# Patient Record
Sex: Female | Born: 1945 | Race: White | Hispanic: No | Smoking: Former smoker
Health system: Southern US, Community
[De-identification: ages and names within clinical notes are randomized; demographics above are authoritative.]

## PROBLEM LIST (undated history)

## (undated) DIAGNOSIS — I509 Heart failure, unspecified: Secondary | ICD-10-CM

## (undated) DIAGNOSIS — E039 Hypothyroidism, unspecified: Secondary | ICD-10-CM

## (undated) DIAGNOSIS — G20A1 Parkinson's disease without dyskinesia, without mention of fluctuations: Secondary | ICD-10-CM

## (undated) DIAGNOSIS — I639 Cerebral infarction, unspecified: Secondary | ICD-10-CM

## (undated) DIAGNOSIS — C4490 Unspecified malignant neoplasm of skin, unspecified: Secondary | ICD-10-CM

## (undated) DIAGNOSIS — F32A Depression, unspecified: Secondary | ICD-10-CM

## (undated) DIAGNOSIS — I10 Essential (primary) hypertension: Secondary | ICD-10-CM

## (undated) DIAGNOSIS — F419 Anxiety disorder, unspecified: Secondary | ICD-10-CM

## (undated) DIAGNOSIS — R32 Unspecified urinary incontinence: Secondary | ICD-10-CM

## (undated) DIAGNOSIS — M109 Gout, unspecified: Secondary | ICD-10-CM

## (undated) DIAGNOSIS — G4733 Obstructive sleep apnea (adult) (pediatric): Secondary | ICD-10-CM

## (undated) DIAGNOSIS — F329 Major depressive disorder, single episode, unspecified: Secondary | ICD-10-CM

## (undated) DIAGNOSIS — N3281 Overactive bladder: Secondary | ICD-10-CM

## (undated) DIAGNOSIS — R600 Localized edema: Secondary | ICD-10-CM

## (undated) DIAGNOSIS — G20 Parkinson's disease: Secondary | ICD-10-CM

## (undated) DIAGNOSIS — R609 Edema, unspecified: Secondary | ICD-10-CM

## (undated) DIAGNOSIS — Z9989 Dependence on other enabling machines and devices: Secondary | ICD-10-CM

## (undated) DIAGNOSIS — R000 Tachycardia, unspecified: Secondary | ICD-10-CM

## (undated) DIAGNOSIS — J45909 Unspecified asthma, uncomplicated: Secondary | ICD-10-CM

## (undated) DIAGNOSIS — J302 Other seasonal allergic rhinitis: Secondary | ICD-10-CM

## (undated) DIAGNOSIS — I214 Non-ST elevation (NSTEMI) myocardial infarction: Secondary | ICD-10-CM

## (undated) DIAGNOSIS — J189 Pneumonia, unspecified organism: Secondary | ICD-10-CM

## (undated) DIAGNOSIS — Z9289 Personal history of other medical treatment: Secondary | ICD-10-CM

## (undated) DIAGNOSIS — D649 Anemia, unspecified: Secondary | ICD-10-CM

## (undated) HISTORY — DX: Tachycardia, unspecified: R00.0

## (undated) HISTORY — DX: Hypothyroidism, unspecified: E03.9

## (undated) HISTORY — PX: TUBAL LIGATION: SHX77

## (undated) HISTORY — PX: UTERINE FIBROID SURGERY: SHX826

## (undated) HISTORY — DX: Other seasonal allergic rhinitis: J30.2

## (undated) HISTORY — PX: TONSILLECTOMY: SUR1361

## (undated) HISTORY — DX: Parkinson's disease without dyskinesia, without mention of fluctuations: G20.A1

## (undated) HISTORY — PX: ABDOMINAL HYSTERECTOMY: SHX81

## (undated) HISTORY — DX: Parkinson's disease: G20

## (undated) HISTORY — PX: SKIN CANCER EXCISION: SHX779

## (undated) HISTORY — DX: Depression, unspecified: F32.A

## (undated) HISTORY — DX: Essential (primary) hypertension: I10

## (undated) HISTORY — PX: THYROIDECTOMY, PARTIAL: SHX18

## (undated) HISTORY — DX: Major depressive disorder, single episode, unspecified: F32.9

---

## 2006-01-20 ENCOUNTER — Encounter: Admission: RE | Admit: 2006-01-20 | Discharge: 2006-01-20 | Payer: Self-pay | Admitting: Cardiology

## 2014-10-14 ENCOUNTER — Ambulatory Visit
Admission: RE | Admit: 2014-10-14 | Discharge: 2014-10-14 | Disposition: A | Discharge status: Home / Self Care | Payer: Medicare Other | Source: Ambulatory Visit | Attending: Cardiology | Admitting: Cardiology

## 2014-10-14 ENCOUNTER — Other Ambulatory Visit: Payer: Self-pay | Admitting: Cardiology

## 2014-10-14 DIAGNOSIS — R0602 Shortness of breath: Secondary | ICD-10-CM

## 2015-09-28 ENCOUNTER — Encounter: Payer: Self-pay | Admitting: Neurology

## 2015-09-28 ENCOUNTER — Ambulatory Visit (INDEPENDENT_AMBULATORY_CARE_PROVIDER_SITE_OTHER): Payer: Medicare Other | Admitting: Neurology

## 2015-09-28 VITALS — BP 128/84 | HR 70 | Ht 65.0 in | Wt 179.0 lb

## 2015-09-28 DIAGNOSIS — G249 Dystonia, unspecified: Secondary | ICD-10-CM | POA: Diagnosis not present

## 2015-09-28 DIAGNOSIS — G20 Parkinson's disease: Secondary | ICD-10-CM | POA: Diagnosis not present

## 2015-09-28 NOTE — Progress Notes (Signed)
Ana Salazar was seen today in the movement disorders clinic for neurologic consultation at the request of [name], [name], MD.   The patient presents today for a second opinion regarding Parkinson's disease. She is accompanied by her daughter and sister who supplement the history.   I have reviewed an extensive number of her records from her prior neurologist, [name], and I appreciate those records.  The patient presented to [name] first in November, 2014, but reported that she had had symptoms for about a year and a half prior to that.  Her first symptoms consisted of difficulty moving her feet/shuffling the feet; trouble getting out of the bed; tremor (she thinks that it started in both but does state that the right is worse than the left and always has been).  The patient was started on a trial of levodopa at her first visit with [name] and followed up with him the next month and noted great improvement with the medication.  She was started on pramipexole the following visit but noted that it made her dizzy and it was discontinued over the telephone.  The following visit, she was started on entacapone 200 mg 3 times a day.  In April, 2015 her carbidopa/levodopa 25/100 was increased to 2 tablets 3 times per day.  In July, 2016 she was changed to carbidopa/levodopa 50/200, 3 times per day.  In October, 2016 she was placed on a combination of carbidopa/levodopa 50/200, 3 times a day (4-5am, noon, 8pm) in addition to carbidopa/levodopa 25/100, 2 tablets 3 times per day (1200 mg of levodopa per day).  She remains on the entacapone, 200 mg 3 times per day as well as primidone, 50 mg - 1.5 tablets at night, which she is using for tremor control.  She reports that her biggest frustration is that she is always moving and is more dizzy and is starting to have a few falls.  When she is nearing end of dose (1-2 hours prior) she will have tremor in her stomach, followed by her legs and arms.    Specific  Symptoms:  Tremor: Yes.   Voice: less and also more slurred in evening Sleep: trouble staying asleep  Vivid Dreams:  Yes.    Acting out dreams:  Yes.   Wet Pillows: Yes.   Postural symptoms:  Yes.    Falls?  Yes.   (last fall was beginning of December; never did parkinsons related PT) Bradykinesia symptoms: shuffling gait, slow movements and difficulty getting out of a chair Loss of smell:  Yes.   but also smells things that others don't Loss of taste:  Yes.   Urinary Incontinence:  Yes.  , wears undergarments x 1 year; happens when stands up or with stress incontinence Difficulty Swallowing:  rarely Handwriting, micrographia: Yes.   (right handed) Trouble with ADL's:  No.  Trouble buttoning clothing: Yes.   Depression:  No. per patient but yes per family and more agitated per family Memory changes:  Yes.   but minimally Hallucinations:  No.  visual distortions: Yes.   N/V:  No. Lightheaded:  Yes.    Syncope: No. Diplopia:  No. Dyskinesia:  Yes.  , since last visit at least and levodopa was increased  Neuroimaging has  previously been performed.  It is not available for my review today.  Pt states that it was done at [facility] (was done prior to going to [name] at [place]).  No fam hx of PD.  ALLERGIES:   Allergies  Allergen Reactions  . Amlodipine Other (See Comments)  . Valsartan Other (See Comments)  . Ampicillin Swelling  . Levothyroxine Other (See Comments)    Tired, shaky, tremors  . Nebivolol Rash  . Meloxicam Swelling  . Prednisone   . Amoxicillin Rash  . Nsaids Rash    CURRENT MEDICATIONS:  Outpatient Encounter Prescriptions as of 09/28/2015  Medication Sig  . carbidopa-levodopa (SINEMET CR) 50-200 MG tablet Take 1 tablet by mouth 3 (three) times daily.  . carbidopa-levodopa (SINEMET IR) 25-100 MG tablet Take 2 tablets by mouth 3 (three) times daily.  . cetirizine (ZYRTEC) 10 MG tablet Take 10 mg by mouth daily.  . ciprofloxacin (CIPRO) 500 MG tablet  Take 500 mg by mouth 2 (two) times daily.  . citalopram (CELEXA) 20 MG tablet Take 30 mg by mouth daily.  . cloNIDine (CATAPRES) 0.1 MG tablet Take 0.1 mg by mouth 2 (two) times daily.  Marland Kitchen CRANBERRY PO Take by mouth daily.  Marland Kitchen docusate sodium (COLACE) 100 MG capsule Take 100 mg by mouth daily.  . entacapone (COMTAN) 200 MG tablet Take 200 mg by mouth 3 (three) times daily.  Marland Kitchen FIBER PO Take by mouth daily.  Marland Kitchen losartan (COZAAR) 100 MG tablet Take 100 mg by mouth daily.  . potassium chloride (K-DUR,KLOR-CON) 10 MEQ tablet Take 10 mEq by mouth daily.  . primidone (MYSOLINE) 50 MG tablet Take 75 mg by mouth at bedtime.  . verapamil (CALAN) 80 MG tablet Take 80 mg by mouth 3 (three) times daily.   No facility-administered encounter medications on file as of 09/28/2015.    PAST MEDICAL HISTORY:   Past Medical History  Diagnosis Date  . Parkinson's disease ([place])   . Hypertension   . Kidney disease   . Tachycardia   . Depression   . Hypothyroidism   . Seasonal allergies     PAST SURGICAL HISTORY:   Past Surgical History  Procedure Laterality Date  . Thyroidectomy, partial    . Abdominal hysterectomy    . Cesarean section    . Tonsillectomy      SOCIAL HISTORY:   Social History   Social History  . Marital Status: Unknown    Spouse Name: N/A  . Number of Children: N/A  . Years of Education: N/A   Occupational History  . Not on file.   Social History Main Topics  . Smoking status: Former Smoker    Quit date: 09/27/2012  . Smokeless tobacco: Not on file  . Alcohol Use: No  . Drug Use: No  . Sexual Activity: Not on file   Other Topics Concern  . Not on file   Social History Narrative  . No narrative on file    FAMILY HISTORY:   Family Status  Relation Status Death Age  . Mother Deceased     heart disease, DM  . Father Deceased     MI    ROS:  A complete 10 system review of systems was obtained and was unremarkable apart from what is mentioned above.  PHYSICAL  EXAMINATION:    VITALS:   Filed Vitals:   09/28/15 1409  BP: 128/84  Pulse: 70  Height: 5\' 5"  (1.651 m)  Weight: 179 lb (81.194 kg)    GEN:  The patient appears stated age and is in NAD. HEENT:  Normocephalic, atraumatic.  The mucous membranes are moist. The superficial temporal arteries are without ropiness or tenderness. CV:  RRR Lungs:  CTAB.  There is dyspnea on exertion. Neck/HEME:  There are no carotid bruits bilaterally.  Neurological examination:  Orientation: The patient is alert and oriented x3. Fund of knowledge is appropriate.  Recent and remote memory are intact.  Attention and concentration are normal.    Able to name objects and repeat phrases. Cranial nerves: There is good facial symmetry.  There is mild facial hypomimia.  Pupils are equal round and reactive to light bilaterally. Fundoscopic exam reveals clear margins bilaterally. Extraocular muscles are intact. The visual fields are full to confrontational testing. The speech is fluent and clear.  She has minimal trouble with the guttural sounds.  Soft palate rises symmetrically and there is no tongue deviation. Hearing is intact to conversational tone. Sensation: Sensation is intact to light and pinprick throughout (facial, trunk, extremities). Vibration is intact at the bilateral big toe. There is no extinction with double simultaneous stimulation. There is no sensory dermatomal level identified. Motor: Strength is 5/5 in the bilateral upper and lower extremities.   Shoulder shrug is equal and symmetric.  There is no pronator drift. Deep tendon reflexes: Deep tendon reflexes are 1/4 at the bilateral biceps, triceps, brachioradialis, patella and achilles. Plantar responses are downgoing bilaterally.  Movement examination: Tone: There is normal tone in the bilateral upper extremities.  The tone in the lower extremities is normal.  Abnormal movements:There is a mod amount of dyskinesia in the hands and legs.  There is a  very rare right upper extremity tremor noted. Coordination:  There is  decremation with RAM's, seen with toe taps and heel taps bilaterally, right more than left.  She has mild difficulty with rapid pronating movements in the upper extremities, again right greater than left. Gait and Station: The patient has no difficulty arising out of a deep-seated chair without the use of the hands. The patient's stride length is normal with exaggerated arm swing due to dyskinesia.  The patient has a negative pull test.      ASSESSMENT/PLAN:  1.  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  -The patient is experiencing motor fluctuations including dyskinesia and on/off.  She is on a total of 1200 mg of levodopa per day and takes a combination of CR and IR.  She currently takes carbidopa/levodopa 50/200, 3 times per day in addition to carbidopa/levodopa 25/100, 2 tablets 3 times per day.  This is in addition to entacapone, 200 mg 3 times per day and primidone, 75 mg at night.  -We talked about multiple medical and surgical options.  We talked about amantadine, which could help dyskinesia.  We also talked about trying to spread her carbidopa/levodopa 25/100 tablets out to one tablet 6 times per day instead of 2 tablets 3 times per day.  She may find that she gets less dyskinesia with this.  We talked about rytary.  We also talked about duopa.  Finally, I talked to her about surgical options.  She asked me multiple questions about DBS therapy and I answered them to the best of my ability.  She was given patient handouts.  She thinks she is interested in this therapy.  I told her that she should return to her primary neurologist, [name], and talk to him about the above options that we talked about.  In the end, however, she told me that this is actually closer for her and she would like to transfer care here.  She also told me that she would like to  explore DBS  therapy.  We will schedule her for an on and off test.  In the meantime, she will try to spread her carbidopa/levodopa 25/100 out to one tablet 6 times per day.  I may try to simplify her medication regimen in the future.  -We talked extensively about the importance of exercise.  She needs to start doing this.  -I will send her to the neuro rehabilitation unit for physical and occupational therapy.  -After I see her next visit, we will decide whether or not we need to try amantadine. 2.  Much greater than 50% of this visit was spent in counseling with the patient and the family.  Total face to face time:  65 min

## 2015-09-28 NOTE — Patient Instructions (Addendum)
1. We will see you for an ON/OFF test. Please stay off all Parkinson's medication the day of this test (last dose the night before).  2. You have been referred to Neuro Rehab for physical therapy. They will call you directly to schedule an appointment.  Please call [phone] if you do not hear from them.  3. Change Carbidopa Levodopa 25/100 dosage to 1 tablet at 5 am/ 1 tablet at 8 am/ 1 tablet at 11 am/ 1 tablet at 2 pm/ 1 tablet at 5 pm/ 1 tablet at 8 pm 4.  Deep Brain Stimulation  Is it the right choice for me?   What is Deep Brain Stimulation (DBS) Surgery?  DBS is a surgical procedure used to treat symptoms of Parkinson's disease (PD). It involves the implantation of an electrode into the brain (one on each side). The area of the brain that is typically targeted in PD is the subthalamic nucleus.   How does DBS work?  PD is caused by the degeneration of brain cells in a specific part of the brain which make a chemical (neurotransmitter) called dopamine. As time goes by, more and more cells degenerate and the level of dopamine in the brain declines. As a result of this dopamine deficiency, there is a certain circuit in the brain which becomes abnormally overactive. Many symptoms of PD are due to this abnormal, overactive circuit. With DBS, high frequency electrical stimulation is used to disrupt this circuit, thereby blocking the symptoms of PD that were previously being mediated through that circuit. The three main symptoms of PD are shaking (tremor), slowness of movement (bradykinesia), and stiffness (rigidity). All of these symptoms are mediated through this small circuit. Therefore, DBS is very effective in blocking these symptoms. It is important to remember, however, that DBS does not "cure" PD, but rather is a very effective method of treating the symptoms of the disease.   What is actually done during the operation?  The surgical procedure involves the implantation of 2 electrodes (one on  each side of the brain). The electrodes are connected to 2 wires, which are then connected to a generator- pacemaker like device (either one or two) in the chest. The generator (and wires) are placed under the skin similar to a cardiac pacemaker, thus the device itself is not visible. The implanted hardware does, however, produce a lump on the chest where the generator is placed and two small bumps on the scalp where underneath there are small plastic caps which are screwed into the skull and secure the electrode.   What symptoms can I expect DBS to treat?  DBS treats many, but not all symptoms of PD. As mentioned above, tremor, stiffness (rigidity), and slowness of movement (bradykinesia) all respond well to DBS therapy. In addition, many patients with advanced PD have problems with what we call "motor fluctuations". This refers to the wearing off of medication before the next dose associated with breakthrough of PD symptoms, and at other times the effects of excess medication, such as involuntary wiggling (dyskinesia). Because the electrical stimulation is constant, the effect is continuous. Therefore motor fluctuations can be significantly reduced. Furthermore, after DBS most patients are able to significantly reduce the amount of Parkinson's medications they were previously taking. Therefore, side effects of these medications can be significantly reduced as well, and often completely eliminated. Common anti-Parkinson medication side effects include: involuntary wiggling (dyskinesia), visual distortions and hallucinations, nausea and vomiting, and lightheadedness.   What symptoms are not treated with DBS?  Some symptoms of PD are mediated through other brain circuits. Therefore, those symptoms would not be expected to improve with DBS. These symptoms include: soft and mumbled speech (hypophonia), balance trouble, and memory deficits. Even if the DBS surgery is done perfectly, the patient will still have PD.  Therefore, because DBS does not block all of the effected brain circuits, the above mentioned symptoms will likely continue to progress and worsen with time.   How functional can I expect to be following DBS surgery?  Most patients who are good candidates improve with DBS. Think about how functional you are now, when your medicines are "kicked in" and working at their very best. After DBS we can often get you to that point and keep you there, without all of the fluctuations and the medication side effects. Some patients with PD have bad tremor that does not respond well to medication. DBS can work well to control tremor even when medication cannot.   What are the risks of surgery?  Because the surgery involves introducing a foreign object into the brain, there are inherent risks that are present. First, there is a very small risk, approximately 1%, of having bleeding into the brain causing symptoms similar to that of a stroke. Secondly, there is a 5-7% chance of having an infection related to the procedure. If the device gets infected, then the treatment usually requires that the infected hardware be removed temporarily while antibiotics are given. After the infection is resolved, then the hardware needs to be re-implanted. This would not leave the patient with permanent problems, but it is easy to understand how disappointed someone might be if they have to go through the surgery again. Typical symptoms of infection include redness, swelling, or pain around the device on the skin. There is theoretically a very small chance (much less than 1%) that an infection could spread to the brain. This, of course, would be much more serious. Another small risk of brain surgery is possible seizure (2-3%). A seizure produces transient sudden loss of consciousness and generalized shaking (convulsion). This can be caused by irritation of the brain during the operation. If a seizure occurs, it is almost always at the time of  operation. It may require temporarily being treated with seizure medications, but this is typically only short term.   How much trouble is it to get DBS?  Unfortunately, undergoing DBS surgery is a process involving multiple steps. Even prior to surgery, there are several steps that must be done. The surgery itself takes place in three separate parts. About a week prior to insertion of the electrodes, you will be seen in an ambulatory surgery center to put in markers into the skull, called fiducials. This allows [place] to plan the surgery and to better localize the area in which we will operate. One week later, stage 1 of the procedure will be done in which the electrodes are implanted. Approximately one week later, stage 2 of the surgery will be done in which the generator (battery) is inserted. Stage 1 of the surgery usually takes several hours. This is when the electrode is placed. This surgery has to be done while the patient is awake. Local anesthesia is used, so the procedure is not painful, but obviously it is a little scary to be operated on while you are awake. Furthermore, patients need to be off of their anti-Parkinson medication during the operation, so that we can more easily identify the abnormal circuit in the brain. It is unpleasant  being off anti-Parkinson medication, even for this short time. Approximately 6 weeks after the electrode placement, programming of the device will take place. This allows [place] to alter the type of stimulation and optimize the beneficial effects. This is done over several clinic visits. The second visit is usually just a week after the first, but subsequent visits will be less frequent. Eventually you shouldn't need to be seen more than once every 4 months or so. Overall, you should expect several programming visits before you see the full benefit from DBS surgery.   How long does the hardware last?  The generator runs on a battery inside the device. This battery typically  needs to be replaced every 3 to 5 years. The battery replacement operation, however, is much easier than the initial elaborate operation. It is typically done as an outpatient procedure.   Does DBS always work?  The key to success is exact placement of the electrode. As you can imagine, the brain has many circuits which are closely packed together. If the electrode is close to being in the right position, but not quite, then there may be a partial response rather than a complete response. If this happens, we may have to turn up the stimulation to try and more completely block the circuit. If we do this, however, we may effect other adjacent circuits that we are not intending to effect, and thereby produce stimulation-related side effects such as slurred speech or facial muscle pulling. These side effects can be easily eliminated by reducing the strength of the stimulation, but then some of the Parkinson symptoms may break through.   Is DBS the right choice for you?  As you can now see, there are many things that carefully need to be considered when making this decision. DBS is not appropriate for all patients with PD. The ultimate decision is yours to make. It is our job to provide you with all the pros and cons, so that you can make the choice that is right for you.  Logistical Details: Pre-Operative Visits:  1) "On-Off" Testing. This visit takes place in the clinic with Dr. Carles Collet several weeks prior to surgery to help determine if you are a good DBS candidate. You will come to the clinic having NOT TAKEN your PD medications. A series of physical examination tests will be done. Then you will be given a dose of carbidopa/levodopa dissolved in ginger ale. Approximately 30 minutes later you will be re-examined with the same tests to see how you respond.   *IT IS EXTREMELY IMPORTANT NOT TO TAKE YOUR PARKINSON MEDICATIONS ON THE DAY OF THIS CLINIC VISIT.   2) Neuropsychological Testing. This is standard  testing in all potential candidates to help determine those patients that may be at risk for developing worsening cognition from the procedure. This is a long clinic visit (multiple hours).  3) Pre-Operative MRI. If you are deemed to be a good surgical candidate based on the above 2 visits, you will need to have MRI imaging done. It is very important that we get high quality study. You must have someone accompany you to this visit as we may have to give you sedation in order to make sure the MRI images are of adequate quality.  What to expect regarding surgery:  1. The first step involves placement of the fiducial (reference) pins. This is done the week before surgery by Dr. Vertell Limber. You are given 5 local injections of anesthetic (numbing medication). Next, 5 pins are screwed  into the skull. Following the placement of the pins, you will be transferred down to have a head CT scan. The CT is used in planning for the surgery.  2. Surgery typically takes place one week later. You will have been off all of your Parkinson medications.  3. You will have the sense of "hurry up and wait" multiple times throughout the day, but it is extremely important to remain as patient as possible. It is during these times that we are busy "behind the scenes" doing the surgical planning with all of the imaging scans that you've had done.  4. In the pre-op area, you will meet with the nurses and anesthesia staff. You may have a catheter placed into the bladder. Once you are taken back to the OR suite, you will be placed in a "beach chair" position. You will not be under general anesthesia. We need you to be awake during certain parts to allow [place] to do important testing. The actual surgical procedure is not painful. It is done with local anesthetic agents. However, the procedure can take up to 6-8 hours, and it is expected that you'll become uncomfortable. We try to minimize any sedating medications, but can give you something if  needed.  5. You will have a bad haircut, but it will grow back!  6. Following the surgery, you will stay overnight in the hospital for observation.  7. The following day, you will have a very special kind brain MRI scan to allow [place] to evaluate the placement of the electrodes as this is very helpful in subsequent programming. Usually, patients are discharged home the day after surgery.   * It is extremely important to remember that after having DBS surgery, you will no longer be able to have a typical MRI scan. This can lead to heating of the electrode wires causing serious burns to the brain and even death.   8. About 1 week later you will return for an outpatient surgery that lasts 1-2 hours during which the generator(s) will be placed. You will go home on the same day as the surgery. You will find that you are more uncomfortable after this surgery than your first surgery. You will be given medications to help with this. The pain from this surgery usually resolves in 2 or 3 days.

## 2015-09-29 NOTE — Progress Notes (Signed)
Note routed to Dr [employer].

## 2015-10-13 ENCOUNTER — Encounter: Payer: Self-pay | Admitting: Neurology

## 2015-10-13 ENCOUNTER — Ambulatory Visit (INDEPENDENT_AMBULATORY_CARE_PROVIDER_SITE_OTHER): Payer: Medicare Other | Admitting: Neurology

## 2015-10-13 VITALS — BP 110/66 | HR 57 | Ht 62.0 in | Wt 177.0 lb

## 2015-10-13 DIAGNOSIS — G20 Parkinson's disease: Secondary | ICD-10-CM | POA: Diagnosis not present

## 2015-10-13 MED ORDER — CARBIDOPA-LEVODOPA 25-100 MG PO TABS
3.0000 | ORAL_TABLET | Freq: Once | ORAL | Status: AC
  Administered 2015-10-13: 3 via ORAL

## 2015-10-13 NOTE — Patient Instructions (Signed)
1. You have been referred to Dr Karen Sullivan for Neuro Psych testing. They will call you directly to schedule an appointment. Please call [phone] if you do not hear from them.    

## 2015-10-13 NOTE — Progress Notes (Signed)
Ana Salazar was seen today in the movement disorders clinic for neurologic consultation at the request of [name], [name], MD.   The patient presents today for a second opinion regarding Parkinson's disease. She is accompanied by her daughter and sister who supplement the history.   I have reviewed an extensive number of her records from her prior neurologist, [name], and I appreciate those records.  The patient presented to [name] first in November, 2014, but reported that she had had symptoms for about a year and a half prior to that.  Her first symptoms consisted of difficulty moving her feet/shuffling the feet; trouble getting out of the bed; tremor (she thinks that it started in both but does state that the right is worse than the left and always has been).  The patient was started on a trial of levodopa at her first visit with [name] and followed up with him the next month and noted great improvement with the medication.  She was started on pramipexole the following visit but noted that it made her dizzy and it was discontinued over the telephone.  The following visit, she was started on entacapone 200 mg 3 times a day.  In April, 2015 her carbidopa/levodopa 25/100 was increased to 2 tablets 3 times per day.  In July, 2016 she was changed to carbidopa/levodopa 50/200, 3 times per day.  In October, 2016 she was placed on a combination of carbidopa/levodopa 50/200, 3 times a day (4-5am, noon, 8pm) in addition to carbidopa/levodopa 25/100, 2 tablets 3 times per day (1200 mg of levodopa per day).  She remains on the entacapone, 200 mg 3 times per day as well as primidone, 50 mg - 1.5 tablets at night, which she is using for tremor control.  She reports that her biggest frustration is that she is always moving and is more dizzy and is starting to have a few falls.  When she is nearing end of dose (1-2 hours prior) she will have tremor in her stomach, followed by her legs and arms.    10/13/15  update:  The patient is following care today, primarily for levodopa challenge.  She is currently off of medication.  She is accompanied by her daughter (different daughter than last visit) who supplements the history.  She last took her medication at 8pm.  Her medication generally consists of carbidopa/levodopa 50/200 3 times per day in addition to carbidopa/levodopa 25/100, 1 tablet 6 times per day.  Splitting the medication did help somewhat, but she thinks that it is not helping as much as it does initially when she first started doing it that way.  She is also on entacapone 200 mg 3 times per day and primidone 75 mg at night.  She has not had any falls since our last visit.  Neuroimaging has  previously been performed.  It is not available for my review today.  Pt states that it was done at [facility] (was done prior to going to [name] at [facility]).  No fam hx of PD.    ALLERGIES:   Allergies  Allergen Reactions  . Amlodipine Other (See Comments)  . Valsartan Other (See Comments)  . Ampicillin Swelling  . Levothyroxine Other (See Comments)    Tired, shaky, tremors  . Nebivolol Rash  . Meloxicam Swelling  . Prednisone   . Amoxicillin Rash  . Nsaids Rash    CURRENT MEDICATIONS:  Outpatient Encounter Prescriptions as of 10/13/2015  Medication Sig  .  carbidopa-levodopa (SINEMET CR) 50-200 MG tablet Take 1 tablet by mouth 3 (three) times daily.  . carbidopa-levodopa (SINEMET IR) 25-100 MG tablet Take 2 tablets by mouth 3 (three) times daily.  . cetirizine (ZYRTEC) 10 MG tablet Take 10 mg by mouth daily.  . citalopram (CELEXA) 20 MG tablet Take 30 mg by mouth daily.  . cloNIDine (CATAPRES) 0.1 MG tablet Take 0.1 mg by mouth 2 (two) times daily.  Marland Kitchen CRANBERRY PO Take by mouth daily.  Marland Kitchen docusate sodium (COLACE) 100 MG capsule Take 100 mg by mouth daily.  . entacapone (COMTAN) 200 MG tablet Take 200 mg by mouth 3 (three) times daily.  Marland Kitchen FIBER PO Take by mouth daily.  Marland Kitchen losartan (COZAAR)  100 MG tablet Take 100 mg by mouth daily.  . potassium chloride (K-DUR,KLOR-CON) 10 MEQ tablet Take 10 mEq by mouth daily.  . primidone (MYSOLINE) 50 MG tablet Take 75 mg by mouth at bedtime.  . verapamil (CALAN) 80 MG tablet Take 80 mg by mouth 3 (three) times daily.  . [DISCONTINUED] ciprofloxacin (CIPRO) 500 MG tablet Take 500 mg by mouth 2 (two) times daily.  . carbidopa-levodopa (SINEMET IR) 25-100 MG per tablet immediate release 3 tablet    No facility-administered encounter medications on file as of 10/13/2015.    PAST MEDICAL HISTORY:   Past Medical History  Diagnosis Date  . Parkinson's disease ([place])   . Hypertension   . Kidney disease   . Tachycardia   . Depression   . Hypothyroidism   . Seasonal allergies     PAST SURGICAL HISTORY:   Past Surgical History  Procedure Laterality Date  . Thyroidectomy, partial    . Abdominal hysterectomy    . Cesarean section    . Tonsillectomy      SOCIAL HISTORY:   Social History   Social History  . Marital Status: Unknown    Spouse Name: N/A  . Number of Children: N/A  . Years of Education: N/A   Occupational History  . retired    Social History Main Topics  . Smoking status: Former Smoker    Quit date: 09/27/2012  . Smokeless tobacco: Not on file  . Alcohol Use: No  . Drug Use: No  . Sexual Activity: Not on file   Other Topics Concern  . Not on file   Social History Narrative    FAMILY HISTORY:   Family Status  Relation Status Death Age  . Mother Deceased     heart disease, DM, complications of fall  . Father Deceased     MI  . Sister Deceased     DM, CAD, renal failure  . Sister Alive     healthy  . Child Alive     healthy    ROS:  A complete 10 system review of systems was obtained and was unremarkable apart from what is mentioned above.  PHYSICAL EXAMINATION:    VITALS:   Filed Vitals:   10/13/15 1409  BP: 110/66  Pulse: 57  Height: 5\' 2"  (1.575 m)  Weight: 177 lb (80.287 kg)    GEN:   The patient appears stated age and is in NAD. HEENT:  Normocephalic, atraumatic.  The mucous membranes are moist. The superficial temporal arteries are without ropiness or tenderness. CV:  RRR Lungs:  CTAB.  There is dyspnea on exertion. Neck/HEME:  There are no carotid bruits bilaterally.  Neurological examination:  Orientation: The patient is alert and oriented x3. Fund of knowledge is appropriate.  Cranial nerves: There is good facial symmetry.  There is mild facial hypomimia.   The speech is fluent and clear.  She has minimal trouble with the guttural sounds.  Soft palate rises symmetrically and there is no tongue deviation. Hearing is intact to conversational tone. Sensation: Sensation is intact to light touch throughout.  A complete UPDRS motor off/on test was performed.  UPDRS motor off score was 27.  However, she had absolutely no rigidity off of medication and very little tremor.  UPDRS motor on the score was 12.  She was much more stable with ambulation when she was on medication, but did have a significant amount of dyskinesia.  300 mg of levodopa was given during the test given that she is on a fairly large amount home.  Movement examination: Tone: There is normal tone in the bilateral upper extremities.  The tone in the lower extremities is normal.  This is true both before and after medication. Abnormal movements:There is a mod amount of dyskinesia in the hands and legs after levodopa is administered.  There is a very rare right upper extremity tremor noted prior to the administered dosage. Coordination:  There is only mild decremation with rapid alternating movements. Gait and Station: The patient has no difficulty arising out of a deep-seated chair without the use of the hands. The patient's stride length is normal with exaggerated arm swing due to dyskinesia after levodopa is given.  The patient has a negative pull test.      ASSESSMENT/PLAN:  1.  Probable idiopathic  Parkinson's disease, diagnosed in 2014 with symptoms dating back to early 2013.  -The patient is experiencing motor fluctuations including dyskinesia and on/off.  She is on a total of 1200 mg of levodopa per day and takes a combination of CR and IR.  She currently takes carbidopa/levodopa 50/200, 3 times per day in addition to carbidopa/levodopa 25/100, one tablet 6 times a day.  This is in addition to entacapone, 200 mg 3 times per day and primidone, 75 mg at night.  -While her UPDRS motor on/off test definitely showed efficacy with levodopa, I was fairly surprised to see that she had virtually no rigidity off of medication.  She had been off of medication since 8 PM the night before and was examined at 2:30 PM.  It may be that the medication lasted this long, although it is doubtful (although I have seen it before).  The levodopa definitely improved ability to ambulate.  I am still willing to consider surgery, but talked to her in detail about the things that would help within the things that it would not help with.  The daughter that was present today was not present at previous visits so we talked about risks and benefits of surgery.  The patient would like to continue to proceed with neuro psych testing.  -We talked extensively about the importance of exercise.  She needs to start doing this.  -She has an appt at neurorehab for eval on 2/21  -Hold amantadine for now.    -I wondered if she doesn't have some dysregulation syndrome.  Will consider this as I get to know her more. 2.  Much greater than 50% of this visit was spent in counseling with the patient and the family.  Total face to face time:  65 min (not including wait time for levodopa to kick in)

## 2015-10-27 ENCOUNTER — Ambulatory Visit: Payer: Medicare Other | Admitting: Occupational Therapy

## 2015-10-27 ENCOUNTER — Ambulatory Visit: Payer: Medicare Other | Attending: Neurology | Admitting: Physical Therapy

## 2015-10-27 DIAGNOSIS — R293 Abnormal posture: Secondary | ICD-10-CM | POA: Diagnosis present

## 2015-10-27 DIAGNOSIS — R6889 Other general symptoms and signs: Secondary | ICD-10-CM | POA: Diagnosis present

## 2015-10-27 DIAGNOSIS — R258 Other abnormal involuntary movements: Secondary | ICD-10-CM | POA: Insufficient documentation

## 2015-10-27 DIAGNOSIS — R279 Unspecified lack of coordination: Secondary | ICD-10-CM | POA: Diagnosis present

## 2015-10-27 DIAGNOSIS — G20 Parkinson's disease: Secondary | ICD-10-CM | POA: Diagnosis present

## 2015-10-27 DIAGNOSIS — R2681 Unsteadiness on feet: Secondary | ICD-10-CM

## 2015-10-27 DIAGNOSIS — R269 Unspecified abnormalities of gait and mobility: Secondary | ICD-10-CM | POA: Insufficient documentation

## 2015-10-27 DIAGNOSIS — G249 Dystonia, unspecified: Secondary | ICD-10-CM

## 2015-10-27 DIAGNOSIS — R29898 Other symptoms and signs involving the musculoskeletal system: Secondary | ICD-10-CM | POA: Diagnosis present

## 2015-10-27 DIAGNOSIS — R278 Other lack of coordination: Secondary | ICD-10-CM

## 2015-10-27 NOTE — Therapy (Addendum)
[facility] [address] [place], [place], [address] Phone: [phone]   Fax:  [phone]  Occupational Therapy Evaluation  Patient Details  Name: Ana Salazar MRN: [id number] Date of Birth: 10-11-1945 Referring Provider: Dr. Wells Guiles Tat  Encounter Date: 10/27/2015      OT End of Session - 10/27/15 1524    Visit Number 1   Number of Visits 17   Date for OT Re-Evaluation 12/24/15   Authorization Type Medicare, [employer], needs G-code   Authorization - Visit Number 1   Authorization - Number of Visits 10   OT Start Time 1148   OT Stop Time 1230   OT Time Calculation (min) 42 min   Activity Tolerance Patient tolerated treatment well   Behavior During Therapy [facility] for tasks assessed/performed      Past Medical History  Diagnosis Date  . Parkinson's disease ([place])   . Hypertension   . Kidney disease   . Tachycardia   . Depression   . Hypothyroidism   . Seasonal allergies     Past Surgical History  Procedure Laterality Date  . Thyroidectomy, partial    . Abdominal hysterectomy    . Cesarean section    . Tonsillectomy      There were no vitals filed for this visit.  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   Limitations PD diagnosis 2014   Patient Stated Goals I want to be able to raise my arm more and move better.     Currently in Pain? No/denies           [facility] OT Assessment - 10/27/15 1151    Assessment   Diagnosis Parkinson's Disease   Referring Provider Dr. Wells Guiles Tat   Onset Date --  2014   Prior Therapy Prior to diagnosis Physical therapy   Precautions   Precautions Fall   Balance Screen   Has the  patient fallen in the past 6 months Yes   How many times? 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    [employer] in [place] 3-4 times per [employer] classes   ADL   Eating/Feeding Set up  difficulty opening containers/packages, min difficulty   Grooming --  difficulty brushing teeth    Upper Body Bathing Modified independent  uses long handled brush   Lower Body Bathing Modified independent   Upper Body Dressing --  doesn't wear buttons, difficulty with jeans   Lower Body Dressing Increased time  for fasteners   Toilet Tranfer Modified independent   Toileting - Clothing Manipulation Modified independent   Tub/Shower Transfer Modified independent  shower stall, seat, grab bar   ADL comments incr time for ADLs, pt reports significant motor fluctuations and on/off times  takes 30-105min to get ready  now, used to take 15-76min   IADL   Prior Level of Function Shopping sister goes with pt to grocery store   Prior Level of Function Light Housekeeping unable to mop floor now, has to hold on to pick up something from floor, takes breaks    Prior Level of Function Meal Prep unable to carry objects, difficulty opening containers/packages  difficulty peeling   Prior Level of Function Community Mobility family will not ride with pt anymore, pt reports that she has had no accidents   Programmer, applications own vehicle  reports that dyskinesia affects gas pedal   Medication Management Is responsible for taking medication in  correct dosages at correct time  difficulty picking up pills   Prior Level of Function Financial Management uses automatic draft    Written Expression   Dominant Hand Right   Handwriting 100% legible;Mild micrographia   Vision - History   Baseline Vision --  wears for reading, driving/in community   Additional Comments Pt denies diplopia   Cognition   Overall Cognitive Status Cognition to be further assessed in functional context PRN  pt denies changes   Observation/Other Assessments   Observations forward head posture   Standing Functional Reach Test R-5.5', L-7'"   Other Surveys  Select   Physical Performance Test   Yes   Simulated Eating Time (seconds) 13.21   Donning Doffing Jacket Time (seconds) 7.19sec   Donning Doffing Jacket Comments buttoning/unbuttoning 3 buttons on table in 24.03sec   Coordination   9 Hole Peg Test Right;Left   Right 9 Hole Peg Test 38.97   Left 9 Hole Peg Test 25.25   Box and Blocks R-37blocks, L-39blocks   Other mod Dyskinesias noted   Tone   Assessment Location Right Upper Extremity;Left Upper Extremity   ROM / Strength   AROM / PROM / Strength AROM   AROM   Overall AROM  Within functional limits for tasks performed   Overall AROM Comments mild decr in bilateral shoulder flex at end range and elbow ext, improved with cueing but pt reports that sometimes pt reports that there times that she can't raise arms   RUE Tone   RUE Tone --  very mild   LUE Tone   LUE Tone --  no significant                            OT Short Term Goals - 10/27/15 1819    OT SHORT TERM GOAL #1   Title Pt will be independent with PD-specific HEP.--check STGs 11/24/15   Time 4   Period Weeks   Status New   OT SHORT TERM GOAL #2   Title Pt will verbalize understanding of ways to prevent future complications related to PD and appropriate community resources prn.   Time 4   Period Weeks   Status New   OT SHORT TERM GOAL #3   Title Pt will  improve functional reaching/balance for ADLs/IADLs as shown by improving standing functional reach test by at least 2 inches bilaterally.   Baseline R-5.5", L-7"   Time 4   Period Weeks   Status New   OT SHORT TERM GOAL #4   Title Pt will improve coordination for ADLs as shown by improving time on 9-hole peg test by at least 5 sec with dominant RUE.   Baseline 38.97sec   Period Weeks   Status New   OT SHORT TERM GOAL #5  Title Pt will report incr ease with peeling food and opening packages/containers.   Time 4   Period Weeks   Status New   Additional Short Term Goals   Additional Short Term Goals Yes   OT SHORT TERM GOAL #6   Title Pt will write at least 3 sentences with no significant decr in size and 100% legibility over at least 2 sessions.   Time 4   Period Weeks   Status New           OT Long Term Goals - 2015/11/22 1823    OT LONG TERM GOAL #1   Title Pt will verbalize understanding of AE/strategies to incr ease with ADLs/IADLs.--check LTGs 12/24/15   Time 8   Period Weeks   Status New   OT LONG TERM GOAL #2   Title Pt will improve bilateral hand coordination/functional reach as shown by improving score on box and blocks test by at least 4 bilaterally.   Baseline R-37 blocks, L-39 blocks   Time 8   Period Weeks   Status New   OT LONG TERM GOAL #3   Title Pt will improve balance/functional reaching for ADLs/IADLs as shown by improving score on standing functional reach test by at least 3 inches with RUE.   Baseline 5.5"   Time 8   Period Weeks   Status New   OT LONG TERM GOAL #4   Title Pt will improve coordination for ADLs as shown by improving time on 9-hole peg test by at least 10 sec with dominant RUE.   Time 8   Period Weeks   Status New   OT LONG TERM GOAL #5   Title Pt will be able to carry object in both hands with turns and backing up 1-2steps without LOB or spills to simulate kitchen tasks using big movement strategies.   Time 8   Period Weeks    Status New               Plan - 11-22-2015 1524    Clinical Impression Statement Pt is a 70 y.o. female diagnosed with Parkinson's disease in 2014 (with symptoms beginning in 2013).  Pt has never had PD-specific therapy or occupational therapy.  Pt presents with bradykinesia, decr coordination, mild rigidity, abnormal posture, decr activity tolerance, decr balance for ADLs, dyskinesias and fluctuating motor performance per pt.  Pt would benefit from occupational therapy to address these deficits in order to incr ease with ADLs/IADLs, prevent future complications from PD, estabilish PD-specific HEP, and improve quality of life.   Pt will benefit from skilled therapeutic intervention in order to improve on the following deficits (Retired) Decreased mobility;Impaired UE functional use;Decreased knowledge of use of DME;Decreased balance;Decreased activity tolerance;Impaired tone;Decreased range of motion;Decreased coordination  bradykinesia, dyskinesias   Rehab Potential Good   OT Frequency 2x / week   OT Duration 8 weeks  +eval   OT Treatment/Interventions Self-care/ADL training;Neuromuscular education;Therapeutic exercise;Functional Mobility Training;Patient/family education;Balance training;Splinting;Manual Therapy;Therapeutic exercises;Energy conservation;Ultrasound;Cryotherapy;DME and/or AE instruction;Therapeutic activities;Cognitive remediation/compensation;Passive range of motion;Electrical Stimulation;Moist Heat  PWR! techniques   Plan initiate PWR! HEP   Recommended Other Services evaluated for PT today    Consulted and Agree with Plan of Care Patient          G-Codes - 11-22-2015 1833    Functional Assessment Tool Used Standing functional reach: R-5.5", L-7"; 9-hole peg test:  R-38.97sec, L-25.25sec; box and blocks test:  R-37, L-39 blocks   Functional Limitation Carrying, moving and handling objects  Carrying, Moving and Handling Objects Current Status [phone]) At least 20 percent  but less than 40 percent impaired, limited or restricted   Carrying, Moving and Handling Objects Goal Status [id number]) At least 1 percent but less than 20 percent impaired, limited or restricted      Problem List Patient Active Problem List   Diagnosis Date Noted  . Parkinson's disease ([place]) 09/28/2015    [facility] 10/27/2015, 6:36 PM  [facility] [address] [place], [place], [address] Phone: [phone]   Fax:  [phone]  Name: Ana Salazar MRN: [id number] Date of Birth: [birth year]  Vianne Bulls, OTR/L [facility] [address] [place], [place]  [id number] [phone] phone [phone] 10/27/2015 6:36 PM

## 2015-10-28 NOTE — Therapy (Signed)
[facility] [address] [place], [place], [address] Phone: [phone]   Fax:  [phone]  Physical Therapy Evaluation  Patient Details  Name: Ana Salazar MRN: [id number] Date of Birth: Sep 26, 1945 Referring Provider: Wells Guiles Tat  Encounter Date: 10/27/2015      PT End of Session - 10/28/15 1136    Visit Number 1   Number of Visits 17   Date for PT Re-Evaluation 12/27/15   Authorization Type Medicare/[employer] [place] (2nd)-G-code every 10th visit   PT Start Time 1109   PT Stop Time 1149   PT Time Calculation (min) 40 min   Activity Tolerance Patient tolerated treatment well   Behavior During Therapy [facility] for tasks assessed/performed      Past Medical History  Diagnosis Date  . Parkinson's disease ([place])   . Hypertension   . Kidney disease   . Tachycardia   . Depression   . Hypothyroidism   . Seasonal allergies     Past Surgical History  Procedure Laterality Date  . Thyroidectomy, partial    . Abdominal hysterectomy    . Cesarean section    . Tonsillectomy      There were no vitals filed for this visit.  Visit Diagnosis:  Abnormality of gait  Bradykinesia  Postural instability      Subjective Assessment - 10/27/15 1110    Subjective Pt is a 70 year old female with Parkinson's disease for approximately 3 years.  She reports increased fatigue and weakness in lower legs, with tremors more in R side.  Pt notes difficulty with sleeping and eating.  Pt has had at least 4-5 falls in the past 6 months.  Pt does not use assistive device.  Several of the falls occurred with turning too fast, tripping over the dog, getting up too fast.  Pt  notes that she bumps into things very often.  She also notes increased slowing and more difficulty with movement when meds are wearing off.   Patient Stated Goals Pt wants helping moving and with balance.   Currently in Pain? No/denies            [facility] PT Assessment  - 10/27/15 1113    Assessment   Medical Diagnosis Parkinson's disease   Referring Provider Wells Guiles Tat   Onset Date/Surgical Date --  10/13/15 MD visit   Precautions   Precautions Fall   Balance Screen   Has the patient fallen in the past 6 months Yes   How many times? 4-5   Has the patient had a decrease in activity level because of a fear of falling?  Yes   Is the patient reluctant to leave their home because of a fear of falling?  Yes   [place] Private residence   Living Arrangements Other relatives  Sister   Available Help at Discharge Family   Type of [place] to enter   Entrance Stairs-Number of Steps 2   Entrance Stairs-Rails Right   [place] One level   [place] seat;Grab bars - tub/shower;Cane - single point;Walker - 2 wheels   Prior Function   Level of Independence Independent with basic ADLs;Independent with household mobility without device;Independent with community mobility without device  Slowed ADLs and household tasks   Vocation Retired   [employer] in [place] 3-4 times per [employer] classes   Posture/Postural Control   Posture/Postural Control Postural limitations   Postural Limitations Forward  head;Rounded Shoulders   Posture Comments R shoulder lower than L   ROM / Strength   AROM / PROM / Strength PROM;Strength   PROM   Overall PROM Comments slightly increased stiffness noted with P/ROM RLE vs. LLE   Strength   Overall Strength Comments Grossly tested at least 4/5 bilateral lower extremities  Pt notes incr. LE fatigue after long bouts of stand/walk   Transfers   Transfers Sit to Stand;Stand to Sit   Sit to Stand 6: Modified independent (Device/Increase time);Without upper extremity assist;From chair/3-in-1   Five time sit to stand comments  14.81 sec   Stand to Sit 6: Modified independent (Device/Increase time);Without upper extremity assist;To chair/3-in-1    Ambulation/Gait   Ambulation/Gait Yes   Ambulation/Gait Assistance 6: Modified independent (Device/Increase time)   Ambulation Distance (Feet) 200 Feet   Assistive device None   Gait Pattern Step-through pattern;Decreased arm swing - right;Decreased arm swing - left;Decreased step length - right;Decreased step length - left;Decreased trunk rotation;Poor foot clearance - left;Poor foot clearance - right;Narrow base of support   Ambulation Surface Level;Indoor   Gait velocity 15.32= 2.14 ft/sec   Standardized Balance Assessment   Standardized Balance Assessment Timed Up and Go Test   Timed Up and Go Test   Normal TUG (seconds) 18.59   Manual TUG (seconds) 19.98   Cognitive TUG (seconds) 19.89   Functional Gait  Assessment   Gait assessed  Yes   Gait Level Surface Walks 20 ft, slow speed, abnormal gait pattern, evidence for imbalance or deviates 10-15 in outside of the 12 in walkway width. Requires more than 7 sec to ambulate 20 ft.  9.32 sec   Change in Gait Speed Makes only minor adjustments to walking speed, or accomplishes a change in speed with significant gait deviations, deviates 10-15 in outside the 12 in walkway width, or changes speed but loses balance but is able to recover and continue walking.   Gait with Horizontal Head Turns Performs head turns with moderate changes in gait velocity, slows down, deviates 10-15 in outside 12 in walkway width but recovers, can continue to walk.  weaves R and L   Gait with Vertical Head Turns Performs task with moderate change in gait velocity, slows down, deviates 10-15 in outside 12 in walkway width but recovers, can continue to walk.   Gait and Pivot Turn Pivot turns safely within 3 sec and stops quickly with no loss of balance.   Step Over Obstacle Cannot perform without assistance.   Gait with Narrow Base of Support Ambulates less than 4 steps heel to toe or cannot perform without assistance.   Gait with Eyes Closed Cannot walk 20 ft without  assistance, severe gait deviations or imbalance, deviates greater than 15 in outside 12 in walkway width or will not attempt task.   Ambulating Backwards Walks 20 ft, slow speed, abnormal gait pattern, evidence for imbalance, deviates 10-15 in outside 12 in walkway width.  16.63sec for 10 ft   Steps Two feet to a stair, must use rail.   Total Score 9   FGA comment: Scores <22/30 indicate increased fall risk                              PT Short Term Goals - 10/28/15 1309    PT SHORT TERM GOAL #1   Title Pt will be independent with HEP to address gait, balance, transfers.  TARGET 11/24/15   Time  4   Period Weeks   Status New   PT SHORT TERM GOAL #2   Title Pt will improve 5x sit<>stand test to less than or equal to 12 seconds for improved transfer efficiency and safety.   Time 4   Period Weeks   Status New   PT SHORT TERM GOAL #3   Title Pt will improve TUG score to less than or equal to 13.5 seconds for decreased fall risk.   Time 4   Period Weeks   Status New   PT SHORT TERM GOAL #4   Title Pt will improve Functional Gait Assessment to at least 14/30 for decreased fall risk.   Time 4   Period Weeks   Status New   PT SHORT TERM GOAL #5   Title Pt will verbalize understanding of local Parkinson related resources.   Time 4   Period Weeks   Status New           PT Long Term Goals - 10/28/15 1311    PT LONG TERM GOAL #1   Title Pt will verbalize understanding of fall prevention within the home environment.  TARGET 12/27/15   Time 8   Period Weeks   Status New   PT LONG TERM GOAL #2   Title Pt will improve gait velocity to at least 2.62 ft/sec for improved efficiency and safety with gait.   Time 8   Period Weeks   Status New   PT LONG TERM GOAL #3   Title Pt will improve TUG cognitive to less than or equal to 15 seconds for decreased fall risk.   Time 8   Period Weeks   Status New   PT LONG TERM GOAL #4   Title Pt will improve Functional GAit  Assessment to at least 19/30 for decreased fall risk.   Time 8   Period Weeks   Status New   PT LONG TERM GOAL #5   Title Pt will verbalize plans for continued community fitness upon D/C from PT.   Time 8   Period Weeks   Status New               Plan - 10/28/15 1137    Clinical Impression Statement Pt is a 70 year old female who presents to [facility] with several year history of Parkinson's disease, with history of 4-5 falls in the past 6 months.  She reports being fearful of falling to the point she is limiting activities.  She presents to physical therapy with decreased functional strength, posture abnormalities, decreased balance, decreased timing and coordination with gait, bradykinesia/slowed transfers, stiffness; pt presents as fall risk per TUG, FGA scores and presents with slowed transfer ability and slowed gait (limited community ambulator) speed.  Pt presents with  at least 5 co-morbidities per PMH.  Pt's presentation is evolving due to her on-off fluctuations/dyskinesias due to medications.  Pt will benefit from skilled physical therapy to address the above stated deficits for improved functional mobility and decreased fall risk.   Pt will benefit from skilled therapeutic intervention in order to improve on the following deficits Abnormal gait;Decreased activity tolerance;Decreased balance;Decreased mobility;Decreased endurance;Decreased strength;Difficulty walking;Impaired flexibility;Postural dysfunction   Rehab Potential Good   PT Frequency 2x / week   PT Duration 8 weeks  plus evaluation   PT Treatment/Interventions ADLs/Self Care Home Management;Therapeutic exercise;Therapeutic activities;Functional mobility training;Gait training;Balance training;Neuromuscular re-education;Patient/family education   PT Next Visit Plan Initiate HEP-balance, posture, transfers; possibly perform SOT (Sensory Organization test) to look  at balance deficits   Consulted and Agree with Plan of Care  Patient          G-Codes - 29-Oct-2015 1315    Functional Assessment Tool Used 5x sit<>stand:  14.81 sec, gait velocity 2.14 ft/sec, TUG score 18.59 sec, TUG manual 19.98 sec, TUG cognitive 19.89 sec, FGA 9/30; 4-5 falls in the past 6 months   Functional Limitation Mobility: Walking and moving around   Mobility: Walking and Moving Around Current Status [phone]) At least 40 percent but less than 60 percent impaired, limited or restricted   Mobility: Walking and Moving Around Goal Status [phone]) At least 20 percent but less than 40 percent impaired, limited or restricted       Problem List Patient Active Problem List   Diagnosis Date Noted  . Parkinson's disease ([place]) 09/28/2015    Nataki Mccrumb W. 10-29-15, 1:16 PM Frazier Butt., PT [facility] [address] [place], [place], [address] Phone: [phone]   Fax:  [phone]  Name: Ana Salazar MRN: [id number] Date of Birth: February 24, 1946

## 2015-11-11 ENCOUNTER — Ambulatory Visit: Payer: Medicare Other | Admitting: Occupational Therapy

## 2015-11-11 ENCOUNTER — Ambulatory Visit: Payer: Medicare Other | Attending: Neurology | Admitting: Physical Therapy

## 2015-11-11 DIAGNOSIS — R269 Unspecified abnormalities of gait and mobility: Secondary | ICD-10-CM | POA: Diagnosis present

## 2015-11-11 DIAGNOSIS — R293 Abnormal posture: Secondary | ICD-10-CM | POA: Diagnosis present

## 2015-11-11 DIAGNOSIS — R29898 Other symptoms and signs involving the musculoskeletal system: Secondary | ICD-10-CM

## 2015-11-11 DIAGNOSIS — R278 Other lack of coordination: Secondary | ICD-10-CM

## 2015-11-11 DIAGNOSIS — R6889 Other general symptoms and signs: Secondary | ICD-10-CM

## 2015-11-11 DIAGNOSIS — G20 Parkinson's disease: Secondary | ICD-10-CM

## 2015-11-11 DIAGNOSIS — R258 Other abnormal involuntary movements: Secondary | ICD-10-CM

## 2015-11-11 DIAGNOSIS — G249 Dystonia, unspecified: Secondary | ICD-10-CM

## 2015-11-11 DIAGNOSIS — R2681 Unsteadiness on feet: Secondary | ICD-10-CM | POA: Diagnosis present

## 2015-11-11 DIAGNOSIS — G20B1 Parkinson's disease with dyskinesia, without mention of fluctuations: Secondary | ICD-10-CM

## 2015-11-11 DIAGNOSIS — R279 Unspecified lack of coordination: Secondary | ICD-10-CM | POA: Insufficient documentation

## 2015-11-11 NOTE — Patient Instructions (Signed)
Feet Together (Compliant Surface) Head Motion - Eyes Closed    Stand on compliant surface: _pillow_______ with feet together. Close eyes and move head slowly, up and down 10 times and side to side 10 times. Repeat __2__ times per session. Do _2-3___ sessions per day.  Copyright  VHI. All rights reserved.  Feet Together (Compliant Surface) Varied Arm Positions - Eyes Closed    Stand on compliant surface: __pillow______ with feet together and arms out. Close eyes and stand still. Hold_10-15___ seconds. Repeat __5__ times per session. Do _2-3___ sessions per day.  Copyright  VHI. All rights reserved.

## 2015-11-11 NOTE — Therapy (Signed)
[facility] [address] [place], [place], [address] Phone: [phone]   Fax:  [phone]  Physical Therapy Treatment  Patient Details  Name: Ana Salazar MRN: [id number] Date of Birth: 11/17/1945 Referring Provider: Wells Guiles Tat  Encounter Date: 11/11/2015      PT End of Session - 11/11/15 1452    Visit Number 2   Number of Visits 17   Date for PT Re-Evaluation 12/27/15   Authorization Type Medicare/[employer] [place] (2nd)-G-code every 10th visit   PT Start Time 1401   PT Stop Time 1439   PT Time Calculation (min) 38 min   Activity Tolerance Patient tolerated treatment well   Behavior During Therapy [facility] for tasks assessed/performed      Past Medical History  Diagnosis Date  . Parkinson's disease ([place])   . Hypertension   . Kidney disease   . Tachycardia   . Depression   . Hypothyroidism   . Seasonal allergies     Past Surgical History  Procedure Laterality Date  . Thyroidectomy, partial    . Abdominal hysterectomy    . Cesarean section    . Tonsillectomy      There were no vitals filed for this visit.  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  has a little pain in right leg [facility]   Patient Stated Goals Pt wants helping moving and with balance.   Currently in Pain? No/denies         Neuro re-ed: sensory organization test performed with following results: Conditions: 1:  Decreased (near avg) trial 1 and 2; WNL trial 3 2:  Decreased (near avg) trial 1 and 2; WNL trial 3 3:  Decreased trial 1 and 2; WNL trial 3 4:  Decreased all trials 5:  FALL x 3 6:  FALL x 3 Composite score:   40 (avg 70) Sensory Analysis Som: just below avg Vis: below avg ~65 (avg 80) Vest: 0 Pref: just below avg Strategy analysis:  Ankle dominant on conditions 5 and 6      COG alignment:      WNL  Issued HEP:  Feet together on compliant surface: 1-EC 5x10 sec 2-EC with horizontal/vertical head turns  Educated on how to perform safely at home                       PT Education - 11/11/15 1451    Education provided Yes   Education Details results of SOT; HEP   Person(s) Educated Patient   Methods Explanation;Demonstration;Handout   Comprehension Verbalized understanding;Need further instruction;Returned demonstration          PT Short Term Goals - 11/11/15 1455    PT SHORT TERM GOAL #1   Title Pt will be independent with HEP to address gait, balance, transfers.  TARGET 11/24/15   Status On-going   PT SHORT TERM GOAL #2   Title Pt will improve 5x sit<>stand test to less than or equal to 12 seconds for improved transfer efficiency and safety.   Status On-going   PT SHORT TERM GOAL #3   Title Pt will improve TUG score to less than or equal to  13.5 seconds for decreased fall risk.   Status On-going   PT SHORT TERM GOAL #4   Title Pt will improve Functional Gait Assessment to at least 14/30 for decreased fall risk.   Status On-going   PT SHORT TERM GOAL #5   Title Pt will verbalize understanding of local Parkinson related resources.   Status On-going           PT Long Term Goals - 11/11/15 1455    PT LONG TERM GOAL #1   Title Pt will verbalize understanding of fall prevention within the home environment.  TARGET 12/27/15   Status On-going   PT LONG TERM GOAL #2   Title Pt will improve gait velocity to at least 2.62 ft/sec for improved efficiency and safety with gait.   Status On-going   PT LONG TERM GOAL #3   Title Pt will improve TUG cognitive to less than or equal to 15 seconds for decreased fall risk.   Status On-going   PT LONG TERM GOAL #4    Title Pt will improve Functional GAit Assessment to at least 19/30 for decreased fall risk.   Status On-going   PT LONG TERM GOAL #5   Title Pt will verbalize plans for continued community fitness upon D/C from PT.   Status On-going               Plan - 11/11/15 1452    Clinical Impression Statement Pt demonstrated decreased sensory input of all systems (somatosensory, vision, and vestibular) on sensory organization with almost no vestibular input.  Initiated HEP for balance and improved vestibular input.  Will continue to benefit from PT to maximize function and decrease fall risk.   PT Next Visit Plan review HEP-balance, add posture, work on transfers, gait   Consulted and Agree with Plan of Care Patient        Problem List Patient Active Problem List   Diagnosis Date Noted  . Parkinson's disease ([place]) 09/28/2015   Laureen Abrahams, PT, DPT 11/11/2015 3:00 PM  [facility] [address] [place], [place], [address] Phone: [phone]   Fax:  [phone]  Name: Ana Salazar MRN: [id number] Date of Birth: 12-14-1945

## 2015-11-12 ENCOUNTER — Ambulatory Visit: Payer: Medicare Other | Admitting: Physical Therapy

## 2015-11-12 ENCOUNTER — Ambulatory Visit: Payer: Medicare Other | Admitting: Occupational Therapy

## 2015-11-12 DIAGNOSIS — R293 Abnormal posture: Secondary | ICD-10-CM

## 2015-11-12 DIAGNOSIS — R6889 Other general symptoms and signs: Secondary | ICD-10-CM

## 2015-11-12 DIAGNOSIS — R269 Unspecified abnormalities of gait and mobility: Secondary | ICD-10-CM

## 2015-11-12 DIAGNOSIS — R278 Other lack of coordination: Secondary | ICD-10-CM

## 2015-11-12 DIAGNOSIS — R258 Other abnormal involuntary movements: Secondary | ICD-10-CM

## 2015-11-12 DIAGNOSIS — R279 Unspecified lack of coordination: Secondary | ICD-10-CM

## 2015-11-12 DIAGNOSIS — R29898 Other symptoms and signs involving the musculoskeletal system: Secondary | ICD-10-CM

## 2015-11-12 NOTE — Therapy (Signed)
[facility] [address] [place], [place], [address] Phone: [phone]   Fax:  [phone]  Occupational Therapy Treatment  Patient Details  Name: Ana Salazar MRN: [id number] Date of Birth: 11-Nov-1945 Referring Provider: Dr. Wells Guiles Tat  Encounter Date: 11/11/2015      OT End of Session - 11/12/15 0850    Visit Number 3   Number of Visits 17   Date for OT Re-Evaluation 12/24/15   Authorization Type Medicare, [employer], needs G-code   Authorization - Visit Number 2   Authorization - Number of Visits 10   OT Start Time [phone]   OT Stop Time 0930   OT Time Calculation (min) 40 min   Activity Tolerance Patient tolerated treatment well   Behavior During Therapy [facility] for tasks assessed/performed      Past Medical History  Diagnosis Date  . Parkinson's disease ([place])   . Hypertension   . Kidney disease   . Tachycardia   . Depression   . Hypothyroidism   . Seasonal allergies     Past Surgical History  Procedure Laterality Date  . Thyroidectomy, partial    . Abdominal hysterectomy    . Cesarean section    . Tonsillectomy      There were no vitals filed for this visit.  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                  [facility]) - 11/11/15 1525    PWR! exercises Moves in sitting;Moves in supine   PWR! Up 10   PWR! Rock 10   PWR! Twist 10   PWR! Step 10   Comments  for performance, and neck position   PWR! Up 10   PWR! Rock 10   PWR! Twist 10   PWR! Step 10   Comments mod v.c. and demonstration for performance             OT Education - 11/12/15 1620    Education provided Yes   Education Details HEP for seated and supine PWR! exercises   Person(s) Educated Patient   Methods Explanation;Demonstration;Verbal  cues;Handout   Comprehension Verbalized understanding;Returned demonstration;Verbal cues required          OT Short Term Goals - 10/27/15 1819    OT SHORT TERM GOAL #1   Title Pt will be independent with PD-specific HEP.--check STGs 11/24/15   Time 4   Period Weeks   Status New   OT SHORT TERM GOAL #2   Title Pt will verbalize understanding of ways to prevent future complications related to PD and appropriate community resources prn.   Time 4   Period Weeks   Status New   OT SHORT TERM GOAL #3   Title Pt will improve functional reaching/balance for ADLs/IADLs as shown by improving standing functional reach test by at least 2 inches bilaterally.   Baseline R-5.5", L-7"   Time 4   Period Weeks   Status New   OT SHORT TERM GOAL #4   Title Pt will improve coordination for ADLs as shown by improving time on 9-hole peg test by at least 5 sec with dominant RUE.   Baseline 38.97sec   Period Weeks   Status New   OT SHORT TERM GOAL #5   Title Pt will report incr ease with  peeling food and opening packages/containers.   Time 4   Period Weeks   Status New   Additional Short Term Goals   Additional Short Term Goals Yes   OT SHORT TERM GOAL #6   Title Pt will write at least 3 sentences with no significant decr in size and 100% legibility over at least 2 sessions.   Time 4   Period Weeks   Status New           OT Long Term Goals - 10/27/15 1823    OT LONG TERM GOAL #1   Title Pt will verbalize understanding of AE/strategies to incr ease with ADLs/IADLs.--check LTGs 12/24/15   Time 8   Period Weeks   Status New   OT LONG TERM GOAL #2   Title Pt will improve bilateral hand coordination/functional reach as shown by improving score on box and blocks test by at least 4 bilaterally.   Baseline R-37 blocks, L-39 blocks   Time 8   Period Weeks   Status New   OT LONG TERM GOAL #3   Title Pt will improve balance/functional reaching for ADLs/IADLs as shown by improving score on  standing functional reach test by at least 3 inches with RUE.   Baseline 5.5"   Time 8   Period Weeks   Status New   OT LONG TERM GOAL #4   Title Pt will improve coordination for ADLs as shown by improving time on 9-hole peg test by at least 10 sec with dominant RUE.   Time 8   Period Weeks   Status New   OT LONG TERM GOAL #5   Title Pt will be able to carry object in both hands with turns and backing up 1-2steps without LOB or spills to simulate kitchen tasks using big movement strategies.   Time 8   Period Weeks   Status New               Plan - 11/12/15 0907    Clinical Impression Statement Pt responds well to cueing for larger amplitude movements, but will need cues/repetition for carryover into ADLs.    Plan review PWR! supine/seated and/or coordination/PWR! Hands HEP   OT Home Exercise Plan issued supine and seated PWR!, coordination HEP/PWR! hands (basic 4)   Consulted and Agree with Plan of Care Patient        Problem List Patient Active Problem List   Diagnosis Date Noted  . Parkinson's disease ([place]) 09/28/2015    Ana Salazar 11/12/2015, 4:22 PM Theone Murdoch, OTR/L [phone] Phone: [phone] 4:22 PM 11/12/2015 [facility] [address] [place], [place], [address] Phone: [phone]   Fax:  [phone]  Name: Ana Salazar MRN: [id number] Date of Birth: Dec 15, 1945

## 2015-11-12 NOTE — Patient Instructions (Signed)
Exercise for stooped posture    Stand, back to wall with head, shoulders, buttocks and heels all touching wall. Hold position 60 seconds, then take two steps away from wall. Step back to wall and correct position if needed. Repeat 3 times. Do 3 sessions per day.  http://gt2.exer.us/688   Copyright  VHI. All rights reserved.

## 2015-11-12 NOTE — Therapy (Signed)
[facility] [address] [place], [place], [address] Phone: [phone]   Fax:  [phone]  Occupational Therapy Treatment  Patient Details  Name: Ana Salazar MRN: [id number] Date of Birth: 1946-05-08 Referring Provider: Dr. Wells Guiles Tat  Encounter Date: 11/12/2015      OT End of Session - 11/12/15 0850    Visit Number 3   Number of Visits 17   Date for OT Re-Evaluation 12/24/15   Authorization Type Medicare, [employer], needs G-code   Authorization - Visit Number 2   Authorization - Number of Visits 10   OT Start Time [phone]   OT Stop Time 0930   OT Time Calculation (min) 40 min   Activity Tolerance Patient tolerated treatment well   Behavior During Therapy [facility] for tasks assessed/performed      Past Medical History  Diagnosis Date  . Parkinson's disease ([place])   . Hypertension   . Kidney disease   . Tachycardia   . Depression   . Hypothyroidism   . Seasonal allergies     Past Surgical History  Procedure Laterality Date  . Thyroidectomy, partial    . Abdominal hysterectomy    . Cesarean section    . Tonsillectomy      There were no vitals filed for this visit.  Visit Diagnosis:  Bradykinesia  Rigidity  Decreased coordination  Decreased functional activity tolerance      Subjective Assessment - 11/12/15 0851    Subjective  Pt reports that she has tried her PWR! HEP at home and they are going well.   Limitations PD diagnosis 2014; pt reports hx of multiple TIAs   Patient Stated Goals I want to be able to raise my arm more and move better.     Currently in Pain? No/denies            Writing with good legibility and size.  (pt typically prints).  Continuous "L" with min decr in size and difficulty.                  OT Education - 11/12/15 [phone]    Education provided Yes   Education Details Coordination HEP with focus on large amplitude movements; PWR! hands HEP (basic  4) and use of PWR! hands for ADLs; how PD affects activities with hands/movement and how to begin incorporating big amplitude movements into ADLs to incr ease and prevent future complications   Person(s) Educated Patient   Methods Explanation;Demonstration;Verbal cues;Handout   Comprehension Verbalized understanding;Returned demonstration;Verbal cues required  v.c. for larger amplitude          OT Short Term Goals - 10/27/15 1819    OT SHORT TERM GOAL #1   Title Pt will be independent with PD-specific HEP.--check STGs 11/24/15   Time 4   Period Weeks   Status New   OT SHORT TERM GOAL #2   Title Pt will verbalize understanding of ways to prevent future complications related to PD and appropriate community resources prn.   Time 4   Period Weeks   Status New   OT SHORT TERM GOAL #3   Title Pt will improve functional reaching/balance for ADLs/IADLs as shown by improving standing functional reach test by at least 2 inches bilaterally.   Baseline R-5.5", L-7"   Time 4   Period Weeks   Status New   OT SHORT TERM GOAL #4   Title Pt will improve coordination for ADLs as shown by improving time on 9-hole  peg test by at least 5 sec with dominant RUE.   Baseline 38.97sec   Period Weeks   Status New   OT SHORT TERM GOAL #5   Title Pt will report incr ease with peeling food and opening packages/containers.   Time 4   Period Weeks   Status New   Additional Short Term Goals   Additional Short Term Goals Yes   OT SHORT TERM GOAL #6   Title Pt will write at least 3 sentences with no significant decr in size and 100% legibility over at least 2 sessions.   Time 4   Period Weeks   Status New           OT Long Term Goals - 10/27/15 1823    OT LONG TERM GOAL #1   Title Pt will verbalize understanding of AE/strategies to incr ease with ADLs/IADLs.--check LTGs 12/24/15   Time 8   Period Weeks   Status New   OT LONG TERM GOAL #2   Title Pt will improve bilateral hand  coordination/functional reach as shown by improving score on box and blocks test by at least 4 bilaterally.   Baseline R-37 blocks, L-39 blocks   Time 8   Period Weeks   Status New   OT LONG TERM GOAL #3   Title Pt will improve balance/functional reaching for ADLs/IADLs as shown by improving score on standing functional reach test by at least 3 inches with RUE.   Baseline 5.5"   Time 8   Period Weeks   Status New   OT LONG TERM GOAL #4   Title Pt will improve coordination for ADLs as shown by improving time on 9-hole peg test by at least 10 sec with dominant RUE.   Time 8   Period Weeks   Status New   OT LONG TERM GOAL #5   Title Pt will be able to carry object in both hands with turns and backing up 1-2steps without LOB or spills to simulate kitchen tasks using big movement strategies.   Time 8   Period Weeks   Status New               Plan - 11/12/15 0907    Clinical Impression Statement Pt responds well to cueing for larger amplitude movements, but will need cues/repetition for carryover into ADLs.    Plan review PWR! supine/seated and/or coordination/PWR! Hands HEP   OT Home Exercise Plan issued supine and seated PWR!, coordination HEP/PWR! hands (basic 4)   Consulted and Agree with Plan of Care Patient        Problem List Patient Active Problem List   Diagnosis Date Noted  . Parkinson's disease ([place]) 09/28/2015    [facility] 11/12/2015, 9:22 AM  [facility] [address] [place], [place], [address] Phone: [phone]   Fax:  [phone]  Name: Ana Salazar MRN: [id number] Date of Birth: 1946-04-17  Vianne Bulls, OTR/L [facility] [address] [place], [place]  [address] [phone] phone [phone] 11/12/2015 9:22 AM

## 2015-11-12 NOTE — Therapy (Signed)
[facility] [address] [place], [place], [address] Phone: [phone]   Fax:  [phone]  Physical Therapy Treatment  Patient Details  Name: Ana Salazar MRN: [id number] Date of Birth: 04-12-1946 Referring Provider: Wells Guiles Tat  Encounter Date: 11/12/2015      PT End of Session - 11/12/15 1641    Visit Number 3   Number of Visits 17   Date for PT Re-Evaluation 12/27/15   Authorization Type Medicare/[employer] (2nd)-G-code every 10th visit   PT Start Time 0935   PT Stop Time 1015   PT Time Calculation (min) 40 min   Equipment Utilized During Treatment Gait belt   Activity Tolerance Patient tolerated treatment well   Behavior During Therapy [facility] for tasks assessed/performed      Past Medical History  Diagnosis Date  . Parkinson's disease ([place])   . Hypertension   . Kidney disease   . Tachycardia   . Depression   . Hypothyroidism   . Seasonal allergies     Past Surgical History  Procedure Laterality Date  . Thyroidectomy, partial    . Abdominal hysterectomy    . Cesarean section    . Tonsillectomy      There were no vitals filed for this visit.  Visit Diagnosis:  Abnormal posture  Abnormality of gait      Subjective Assessment - 11/12/15 0941    Subjective Denies falls or changes.     Patient Stated Goals Pt wants helping moving and with balance.   Currently in Pain? No/denies              [facility] Adult PT Treatment/Exercise - 11/12/15 0001    Ambulation/Gait   Ambulation/Gait Yes   Ambulation/Gait Assistance 5: Supervision   Ambulation/Gait Assistance Details cues for heel strike, arm swing and step length   Ambulation Distance (Feet) 600 Feet  Plus 110' x 4   Assistive device None   Gait Pattern Step-through pattern;Decreased arm swing - right;Decreased arm swing - left;Decreased step length - right;Decreased step length - left;Decreased trunk rotation;Poor foot clearance -  left;Poor foot clearance - right;Narrow base of support   Ambulation Surface Level;Indoor   Posture/Postural Control   Posture/Postural Control Postural limitations   Postural Limitations Forward head;Rounded Shoulders   Posture Comments Provided wall posture as HEP   High Level Balance   High Level Balance Activities Tandem walking;Other (comment)  single limb stance   High Level Balance Comments counter for UE support            Balance Exercises - 11/12/15 1639    Balance Exercises: Standing   Standing Eyes Closed Wide (BOA);Foam/compliant surface;Solid surface;2 reps;Other reps (comment);Head turns  10-head turns and nods;in corner           PT Education - 11/12/15 1640    Education provided Yes   Education Details HEP   Person(s) Educated Patient   Methods Explanation;Demonstration;Handout   Comprehension Verbalized understanding          PT Short Term Goals - 11/11/15 1455    PT SHORT TERM GOAL #1   Title Pt will be independent with HEP to address gait, balance, transfers.  TARGET 11/24/15   Status On-going   PT SHORT TERM GOAL #2   Title Pt will improve 5x sit<>stand test to less than or equal to 12 seconds for improved transfer efficiency and safety.   Status On-going   PT SHORT TERM GOAL #3   Title Pt will  improve TUG score to less than or equal to 13.5 seconds for decreased fall risk.   Status On-going   PT SHORT TERM GOAL #4   Title Pt will improve Functional Gait Assessment to at least 14/30 for decreased fall risk.   Status On-going   PT SHORT TERM GOAL #5   Title Pt will verbalize understanding of local Parkinson related resources.   Status On-going           PT Long Term Goals - 11/11/15 1455    PT LONG TERM GOAL #1   Title Pt will verbalize understanding of fall prevention within the home environment.  TARGET 12/27/15   Status On-going   PT LONG TERM GOAL #2   Title Pt will improve gait velocity to at least 2.62 ft/sec for improved  efficiency and safety with gait.   Status On-going   PT LONG TERM GOAL #3   Title Pt will improve TUG cognitive to less than or equal to 15 seconds for decreased fall risk.   Status On-going   PT LONG TERM GOAL #4   Title Pt will improve Functional GAit Assessment to at least 19/30 for decreased fall risk.   Status On-going   PT LONG TERM GOAL #5   Title Pt will verbalize plans for continued community fitness upon D/C from PT.   Status On-going               Plan - 11/12/15 1642    Clinical Impression Statement Pt with increased difficulty with head nods with eyes closed.  Gait unsteady at times especially with head turns.  Continue PT per POC.   Pt will benefit from skilled therapeutic intervention in order to improve on the following deficits Abnormal gait;Decreased activity tolerance;Decreased balance;Decreased mobility;Decreased endurance;Decreased strength;Difficulty walking;Impaired flexibility;Postural dysfunction   Rehab Potential Good   PT Frequency 2x / week   PT Duration 8 weeks  plus eval   PT Treatment/Interventions ADLs/Self Care Home Management;Therapeutic exercise;Therapeutic activities;Functional mobility training;Gait training;Balance training;Neuromuscular re-education;Patient/family education   PT Next Visit Plan balance activities (SLS, compliant surfaces), gait   Consulted and Agree with Plan of Care Patient        Problem List Patient Active Problem List   Diagnosis Date Noted  . Parkinson's disease ([place]) 09/28/2015    Narda Bonds 11/12/2015, 4:46 PM  [facility] [address] [place], [place], [address] Phone: [phone]   Fax:  [phone]  Name: Ana Salazar MRN: [id number] Date of Birth: 11-Nov-1945    [facility] [place], [place] 11/12/2015 4:46 PM Phone: [phone] Fax: [phone]

## 2015-11-12 NOTE — Patient Instructions (Addendum)
Coordination Exercises  Perform the following exercises for 20 minutes 1 times per day. Perform with both hand(s). Perform using big movements.   Flipping Cards: Place deck of cards on the table. Flip cards over by opening your hand big to grasp and then turn your palm up big, opening hand fully to release.  Deal cards: Hold 1/2 or whole deck in your hand. Use thumb to push card off top of deck with one big push.  Rotate ball with fingertips: Pick up with fingers/thumb and move as much as you can with each turn/movement (clockwise and counter-clockwise).  Toss ball from one hand to the other: Toss big/high.  Toss ball in the air and catch with the same hand: Toss big/high.  Rotate 2 golf balls in your hand: Both directions.  Pick up coins and stack one at a time: Pick up with big, intentional movements. Do not drag coin to the edge. (5-10 in a stack)  Pick up 5-10 coins one at a time and hold in palm. Then, move coins from palm to fingertips one at time and place in coin bank/container.  Practice writing: Slow down, write big, and focus on forming each letter.   PWR! Hands  With arms stretched out in front of you (elbows straight), perform the following:  PWR! Rock: Move wrists up and down [employer]! Twist: Twist palms up and down BIG  Then, start with elbows bent and hands closed.  PWR! Up: Close hands and flick fingers open and apart BIG  PWR! Step: Touch index finger to thumb while keeping other fingers straight. Flick fingers out BIG (thumb out/straighten fingers). Repeat with other fingers. (Step your thumb to each finger).      ** Make each movement big and deliberate so that you feel the movement.  Perform at least 10 repetitions 1x/day, but perform PWR! hands throughout the day when you are having trouble using your hands (picking up/manipulating small objects, writing, eating, typing, sewing, buttoning, etc.).

## 2015-11-17 ENCOUNTER — Ambulatory Visit: Payer: Medicare Other | Admitting: Physical Therapy

## 2015-11-17 ENCOUNTER — Ambulatory Visit: Payer: Medicare Other | Admitting: Occupational Therapy

## 2015-11-17 DIAGNOSIS — R279 Unspecified lack of coordination: Secondary | ICD-10-CM

## 2015-11-17 DIAGNOSIS — R278 Other lack of coordination: Secondary | ICD-10-CM

## 2015-11-17 DIAGNOSIS — R269 Unspecified abnormalities of gait and mobility: Secondary | ICD-10-CM

## 2015-11-17 DIAGNOSIS — R258 Other abnormal involuntary movements: Secondary | ICD-10-CM | POA: Diagnosis not present

## 2015-11-17 DIAGNOSIS — R6889 Other general symptoms and signs: Secondary | ICD-10-CM

## 2015-11-17 DIAGNOSIS — R29898 Other symptoms and signs involving the musculoskeletal system: Secondary | ICD-10-CM

## 2015-11-17 DIAGNOSIS — R2681 Unsteadiness on feet: Secondary | ICD-10-CM

## 2015-11-17 DIAGNOSIS — R293 Abnormal posture: Secondary | ICD-10-CM

## 2015-11-17 NOTE — Therapy (Addendum)
[facility] [address] [place], [place], [address] Phone: [phone]   Fax:  [phone]  Occupational Therapy Treatment  Patient Details  Name: Ana Salazar MRN: [id number] Date of Birth: [birth year] Referring Provider: Dr. Wells Guiles Tat  Encounter Date: 11/17/2015      OT End of Session - 11/17/15 1015    Visit Number 4   Number of Visits 17   Date for OT Re-Evaluation 12/24/15   Authorization Type Medicare, [employer], needs G-code   Authorization - Visit Number 4   Authorization - Number of Visits 10   OT Start Time [phone]   OT Stop Time 1017   OT Time Calculation (min) 40 min   Activity Tolerance Patient tolerated treatment well   Behavior During Therapy [facility] for tasks assessed/performed      Past Medical History  Diagnosis Date  . Parkinson's disease ([place])   . Hypertension   . Kidney disease   . Tachycardia   . Depression   . Hypothyroidism   . Seasonal allergies     Past Surgical History  Procedure Laterality Date  . Thyroidectomy, partial    . Abdominal hysterectomy    . Cesarean section    . Tonsillectomy      There were no vitals filed for this visit.  Visit Diagnosis:  Bradykinesia  Rigidity  Decreased coordination  Decreased functional activity tolerance  Abnormal posture  Unsteadiness      Subjective Assessment - 11/17/15 0951    Subjective  Pt reports ?neuropsych testing 4/17 for possible DBS    Limitations PD diagnosis 2014; pt reports hx of multiple TIAs   Patient Stated Goals I want to be able to raise my arm more and move better.     Currently in Pain? Yes   Pain Score 2    Pain Location --  shoulder    Pain Orientation Right   Pain Descriptors / Indicators Aching;Sore   Pain Frequency Intermittent   Aggravating Factors  sometimes reaching overhead   Pain Relieving Factors rest            [facility] OT Assessment - 11/17/15 0001    Vision Assessment   Ocular Range of Motion Restricted looking down   Tracking/Visual Pursuits Decreased smoothness of horizontal tracking  difficulty tracking inferiorly, loses target                  OT Treatments/Exercises (OP) - 11/17/15 1714    ADLs   ADL Comments Discussed labored breathing and pt reports that MD is aware.  Also discussed DBS as pt reports possible surgery.  Pt is scheduled for neuropsych testing in April.           PWR [facility]) - 11/17/15 1705    PWR! exercises Moves in supine;Moves in sitting   PWR! Up 10   PWR! Rock 10   PWR! Twist 10   PWR! Step 10   Comments min v.c.    PWR! Up 10   PWR! Rock 10   PWR! Twist 10   PWR! Step 10   Comments min v.c. and Salazar        O2 97% after exercises, but labored breathing noted.  Pt reports MD is aware.      OT Education - 11/17/15 1703    Education Details Reviewed PWR! supine and seated ex (basic 4)    Person(s) Educated Patient   Methods Explanation;Demonstration;Verbal cues   Comprehension Verbalized understanding;Returned demonstration;Verbal cues required  min cues for incr wt. shift, larger amplitude          OT Short Term Goals - 10/27/15 1819    OT SHORT TERM GOAL #1   Title Pt will be independent with PD-specific HEP.--check STGs 11/24/15   Time 4   Period Weeks   Status New   OT SHORT TERM GOAL #2   Title Pt will verbalize understanding of ways to prevent future complications related to PD and appropriate community resources prn.   Time 4   Period Weeks   Status New   OT SHORT TERM GOAL #3   Title Pt will improve functional reaching/balance for ADLs/IADLs as shown by improving standing functional reach test by at least 2 inches bilaterally.   Baseline R-5.5", L-7"   Time 4   Period Weeks   Status New   OT SHORT TERM GOAL #4   Title Pt will improve coordination for ADLs as shown by improving time on 9-hole peg test by at least 5 sec with dominant RUE.   Baseline 38.97sec   Period Weeks    Status New   OT SHORT TERM GOAL #5   Title Pt will report incr ease with peeling food and opening packages/containers.   Time 4   Period Weeks   Status New   Additional Short Term Goals   Additional Short Term Goals Yes   OT SHORT TERM GOAL #6   Title Pt will write at least 3 sentences with no significant decr in size and 100% legibility over at least 2 sessions.   Time 4   Period Weeks   Status New           OT Long Term Goals - 10/27/15 1823    OT LONG TERM GOAL #1   Title Pt will verbalize understanding of AE/strategies to incr ease with ADLs/IADLs.--check LTGs 12/24/15   Time 8   Period Weeks   Status New   OT LONG TERM GOAL #2   Title Pt will improve bilateral hand coordination/functional reach as shown by improving score on box and blocks test by at least 4 bilaterally.   Baseline R-37 blocks, L-39 blocks   Time 8   Period Weeks   Status New   OT LONG TERM GOAL #3   Title Pt will improve balance/functional reaching for ADLs/IADLs as shown by improving score on standing functional reach test by at least 3 inches with RUE.   Baseline 5.5"   Time 8   Period Weeks   Status New   OT LONG TERM GOAL #4   Title Pt will improve coordination for ADLs as shown by improving time on 9-hole peg test by at least 10 sec with dominant RUE.   Time 8   Period Weeks   Status New   OT LONG TERM GOAL #5   Title Pt will be able to carry object in both hands with turns and backing up 1-2steps without LOB or spills to simulate kitchen tasks using big movement strategies.   Time 8   Period Weeks   Status New               Plan - 11/17/15 1016    Clinical Impression Statement Pt progressing with understanding of HEP and performed PWR! supine and sitting ex with min v.c.     Plan review PWR! hands HEP, stategies for ADLs. visual scanning activities at [place] issued supine and seated PWR!, coordination HEP/PWR! hands (basic  4)   Consulted and Agree with  Plan of Care Patient        Problem List Patient Active Problem List   Diagnosis Date Noted  . Parkinson's disease ([place]) 09/28/2015    [facility] 11/17/2015, 5:18 PM  [facility] [address] [place], [place], [address] Phone: [phone]   Fax:  [phone]  Name: Ana Salazar MRN: [id number] Date of Birth: September 08, 1945  Vianne Bulls, OTR/L [facility] [address] [place], [place]  [address] [phone] phone [phone] 11/17/2015 5:19 PM

## 2015-11-17 NOTE — Therapy (Signed)
[facility] [address] [place], [place], [address] Phone: [phone]   Fax:  [phone]  Physical Therapy Treatment  Patient Details  Name: Ana Salazar MRN: [id number] Date of Birth: 1946/08/07 Referring Provider: Wells Guiles Tat  Encounter Date: 11/17/2015      PT End of Session - 11/17/15 1040    Visit Number 4   Number of Visits 17   Date for PT Re-Evaluation 12/27/15   Authorization Type Medicare/[employer] (2nd)-G-code every 10th visit   PT Start Time 0850   PT Stop Time 0930   PT Time Calculation (min) 40 min   Equipment Utilized During Treatment Gait belt   Activity Tolerance Patient tolerated treatment well   Behavior During Therapy [facility] for tasks assessed/performed      Past Medical History  Diagnosis Date  . Parkinson's disease ([place])   . Hypertension   . Kidney disease   . Tachycardia   . Depression   . Hypothyroidism   . Seasonal allergies     Past Surgical History  Procedure Laterality Date  . Thyroidectomy, partial    . Abdominal hysterectomy    . Cesarean section    . Tonsillectomy      There were no vitals filed for this visit.  Visit Diagnosis:  Abnormality of gait      Subjective Assessment - 11/17/15 0853    Subjective Feels sore from exercises   Patient Stated Goals Pt wants helping moving and with balance.   Currently in Pain? Yes   Pain Score 2    Pain Location Leg   Pain Orientation Right;Left   Pain Descriptors / Indicators Aching   Pain Type Acute pain   Pain Onset In the past 7 days   Pain Frequency Constant   Aggravating Factors  exercises   Pain Relieving Factors unknown            [facility] Adult PT Treatment/Exercise - 11/17/15 0859    Knee/Hip Exercises: Aerobic   Stationary Bike Scifit level 1.5 all 4 extremities x 8 minutes with rpm>70 with dyspnea 3/4 and SaO2>90% through out             Balance Exercises - 11/17/15 1036    Balance  Exercises: Standing   Standing Eyes Opened Narrow base of support (BOS);Head turns;Foam/compliant surface;Solid surface;Other reps (comment)  repeated x 10-20;   Rockerboard Anterior/posterior;Lateral;Head turns;EO;30 seconds;Intermittent UE support   Other Standing Exercises stepping across/back foam in floor with no UE support forward and lateral   Overall Comments Other (comment);Limitations  Pt appears to have decreased visual tracking during session.           PT Education - 11/17/15 1040    Education provided No          PT Short Term Goals - 11/11/15 1455    PT SHORT TERM GOAL #1   Title Pt will be independent with HEP to address gait, balance, transfers.  TARGET 11/24/15   Status On-going   PT SHORT TERM GOAL #2   Title Pt will improve 5x sit<>stand test to less than or equal to 12 seconds for improved transfer efficiency and safety.   Status On-going   PT SHORT TERM GOAL #3   Title Pt will improve TUG score to less than or equal to 13.5 seconds for decreased fall risk.   Status On-going   PT SHORT TERM GOAL #4   Title Pt will improve Functional Gait Assessment to at least 14/30 for  decreased fall risk.   Status On-going   PT SHORT TERM GOAL #5   Title Pt will verbalize understanding of local Parkinson related resources.   Status On-going           PT Long Term Goals - 11/11/15 1455    PT LONG TERM GOAL #1   Title Pt will verbalize understanding of fall prevention within the home environment.  TARGET 12/27/15   Status On-going   PT LONG TERM GOAL #2   Title Pt will improve gait velocity to at least 2.62 ft/sec for improved efficiency and safety with gait.   Status On-going   PT LONG TERM GOAL #3   Title Pt will improve TUG cognitive to less than or equal to 15 seconds for decreased fall risk.   Status On-going   PT LONG TERM GOAL #4   Title Pt will improve Functional GAit Assessment to at least 19/30 for decreased fall risk.   Status On-going   PT LONG  TERM GOAL #5   Title Pt will verbalize plans for continued community fitness upon D/C from PT.   Status On-going               Plan - 11/17/15 1041    Clinical Impression Statement Pt appears to have decreased tracking visually during balance activities.  Discussed with OT to further assess.  Continue with PT per POC.   Pt will benefit from skilled therapeutic intervention in order to improve on the following deficits Abnormal gait;Decreased activity tolerance;Decreased balance;Decreased mobility;Decreased endurance;Decreased strength;Difficulty walking;Impaired flexibility;Postural dysfunction   Rehab Potential Good   PT Frequency 2x / week   PT Duration 8 weeks  plus eval   PT Treatment/Interventions ADLs/Self Care Home Management;Therapeutic exercise;Therapeutic activities;Functional mobility training;Gait training;Balance training;Neuromuscular re-education;Patient/family education   PT Next Visit Plan balance activities (SLS, compliant surfaces), gait   Consulted and Agree with Plan of Care Patient        Problem List Patient Active Problem List   Diagnosis Date Noted  . Parkinson's disease ([place]) 09/28/2015    Ana Salazar 11/17/2015, 10:42 AM  [facility] [address] [place], [place], [address] Phone: [phone]   Fax:  [phone]  Name: Ana Salazar MRN: [id number] Date of Birth: 1946/08/25    [place], [place] 11/17/2015 10:42 AM Phone: [phone] Fax: [phone]

## 2015-11-19 ENCOUNTER — Ambulatory Visit: Payer: Medicare Other | Admitting: Occupational Therapy

## 2015-11-19 ENCOUNTER — Ambulatory Visit: Payer: Medicare Other | Admitting: Physical Therapy

## 2015-11-19 DIAGNOSIS — R6889 Other general symptoms and signs: Secondary | ICD-10-CM

## 2015-11-19 DIAGNOSIS — R29898 Other symptoms and signs involving the musculoskeletal system: Secondary | ICD-10-CM

## 2015-11-19 DIAGNOSIS — R269 Unspecified abnormalities of gait and mobility: Secondary | ICD-10-CM

## 2015-11-19 DIAGNOSIS — R278 Other lack of coordination: Secondary | ICD-10-CM

## 2015-11-19 DIAGNOSIS — R258 Other abnormal involuntary movements: Secondary | ICD-10-CM | POA: Diagnosis not present

## 2015-11-19 DIAGNOSIS — R279 Unspecified lack of coordination: Secondary | ICD-10-CM

## 2015-11-19 DIAGNOSIS — R293 Abnormal posture: Secondary | ICD-10-CM

## 2015-11-19 NOTE — Patient Instructions (Signed)
   Practice standing with a stagger stance (one foot slightly ahead of the other) when you are standing at the sink or counter (this helps you be able to shift through your hips for better balance and to avoid pitching forward)   When you get ready to turn from standing at the sink or counter, the direction you are turning, you want to lead with that foot.  (Example, when you start to turn to walk to the right, use your right foot to STEP and GO)

## 2015-11-19 NOTE — Therapy (Signed)
[facility] [address] [place], [place], [address] Phone: [phone]   Fax:  [phone]  Occupational Therapy Treatment  Patient Details  Name: Ana Salazar MRN: [id number] Date of Birth: 06-02-1946 Referring Provider: Dr. Wells Guiles Tat  Encounter Date: 11/19/2015      OT End of Session - 11/19/15 1003    Visit Number 5   Number of Visits 17   Date for OT Re-Evaluation 12/24/15   Authorization Type Medicare, [employer], needs G-code   Authorization - Visit Number 5   Authorization - Number of Visits 10   OT Start Time [phone]   OT Stop Time 1016   OT Time Calculation (min) 38 min   Activity Tolerance Patient tolerated treatment well   Behavior During Therapy [facility] for tasks assessed/performed      Past Medical History  Diagnosis Date  . Parkinson's disease ([place])   . Hypertension   . Kidney disease   . Tachycardia   . Depression   . Hypothyroidism   . Seasonal allergies     Past Surgical History  Procedure Laterality Date  . Thyroidectomy, partial    . Abdominal hysterectomy    . Cesarean section    . Tonsillectomy      There were no vitals filed for this visit.  Visit Diagnosis:  Bradykinesia  Rigidity  Decreased coordination  Decreased functional activity tolerance           Treatment: copying small peg design with RUE for visual scanning and fine motor coordination, increased time and difficulty.  Pt was instructed in [employer] and using big movements with ADLs.                 OT Education - 11/19/15 1922    Education provided Yes   Education Details [employer] hands HEP, big movements with ADLS.   Person(s) Educated Patient   Methods Explanation;Demonstration;Verbal cues;Handout   Comprehension Verbalized understanding;Verbal cues required          OT Short Term Goals - 10/27/15 1819    OT SHORT TERM GOAL #1   Title Pt will be independent with PD-specific  HEP.--check STGs 11/24/15   Time 4   Period Weeks   Status New   OT SHORT TERM GOAL #2   Title Pt will verbalize understanding of ways to prevent future complications related to PD and appropriate community resources prn.   Time 4   Period Weeks   Status New   OT SHORT TERM GOAL #3   Title Pt will improve functional reaching/balance for ADLs/IADLs as shown by improving standing functional reach test by at least 2 inches bilaterally.   Baseline R-5.5", L-7"   Time 4   Period Weeks   Status New   OT SHORT TERM GOAL #4   Title Pt will improve coordination for ADLs as shown by improving time on 9-hole peg test by at least 5 sec with dominant RUE.   Baseline 38.97sec   Period Weeks   Status New   OT SHORT TERM GOAL #5   Title Pt will report incr ease with peeling food and opening packages/containers.   Time 4   Period Weeks   Status New   Additional Short Term Goals   Additional Short Term Goals Yes   OT SHORT TERM GOAL #6   Title Pt will write at least 3 sentences with no significant decr in size and 100% legibility over at least 2 sessions.   Time  4   Period Weeks   Status New           OT Long Term Goals - 10/27/15 1823    OT LONG TERM GOAL #1   Title Pt will verbalize understanding of AE/strategies to incr ease with ADLs/IADLs.--check LTGs 12/24/15   Time 8   Period Weeks   Status New   OT LONG TERM GOAL #2   Title Pt will improve bilateral hand coordination/functional reach as shown by improving score on box and blocks test by at least 4 bilaterally.   Baseline R-37 blocks, L-39 blocks   Time 8   Period Weeks   Status New   OT LONG TERM GOAL #3   Title Pt will improve balance/functional reaching for ADLs/IADLs as shown by improving score on standing functional reach test by at least 3 inches with RUE.   Baseline 5.5"   Time 8   Period Weeks   Status New   OT LONG TERM GOAL #4   Title Pt will improve coordination for ADLs as shown by improving time on 9-hole peg  test by at least 10 sec with dominant RUE.   Time 8   Period Weeks   Status New   OT LONG TERM GOAL #5   Title Pt will be able to carry object in both hands with turns and backing up 1-2steps without LOB or spills to simulate kitchen tasks using big movement strategies.   Time 8   Period Weeks   Status New               Plan - 11/19/15 1924    Clinical Impression Statement Pt is progressing towards goals. She verbalizes understanding of ways to incorporate big movements into ADLs.   Pt will benefit from skilled therapeutic intervention in order to improve on the following deficits (Retired) Decreased mobility;Impaired UE functional use;Decreased knowledge of use of DME;Decreased balance;Decreased activity tolerance;Impaired tone;Decreased range of motion;Decreased coordination   Rehab Potential Good   OT Frequency 2x / week   OT Duration 8 weeks   OT Treatment/Interventions Self-care/ADL training;Neuromuscular education;Therapeutic exercise;Functional Mobility Training;Patient/family education;Balance training;Splinting;Manual Therapy;Therapeutic exercises;Energy conservation;Ultrasound;Cryotherapy;DME and/or AE instruction;Therapeutic activities;Cognitive remediation/compensation;Passive range of motion;Electrical Stimulation;Moist Heat   Plan reinforce ADL strategies,        Problem List Patient Active Problem List   Diagnosis Date Noted  . Parkinson's disease ([place]) 09/28/2015    Ana Salazar 11/19/2015, 7:25 PM Theone Murdoch, OTR/L [phone] Phone: [phone] 7:26 PM 11/19/2015 [facility] [address] [place], [place], [address] Phone: [phone]   Fax:  [phone]  Name: Ana Salazar MRN: [id number] Date of Birth: Sep 03, 1946

## 2015-11-19 NOTE — Patient Instructions (Signed)

## 2015-11-20 NOTE — Therapy (Signed)
[facility] [address] [place], [place], [address] Phone: [phone]   Fax:  [phone]  Physical Therapy Treatment  Patient Details  Name: Ana Salazar MRN: [id number] Date of Birth: 12-02-1945 Referring Provider: Wells Guiles Tat  Encounter Date: 11/19/2015      PT End of Session - 11/20/15 0753    Visit Number 5   Number of Visits 17   Date for PT Re-Evaluation 12/27/15   Authorization Type Medicare/[employer] (2nd)-G-code every 10th visit   PT Start Time 0852   PT Stop Time 0934   PT Time Calculation (min) 42 min      Past Medical History  Diagnosis Date  . Parkinson's disease ([place])   . Hypertension   . Kidney disease   . Tachycardia   . Depression   . Hypothyroidism   . Seasonal allergies     Past Surgical History  Procedure Laterality Date  . Thyroidectomy, partial    . Abdominal hysterectomy    . Cesarean section    . Tonsillectomy      There were no vitals filed for this visit.  Visit Diagnosis:  Bradykinesia  Abnormal posture  Abnormality of gait  Postural instability      Subjective Assessment - 11/19/15 0854    Subjective No falls since last visit; no pain today.  Lost my balance going through a door at the store yesterday (wind caught door), but didn't fall.   Patient Stated Goals Pt wants helping moving and with balance.   Currently in Pain? No/denies                    Neuro Re-education:      [facility] Adult PT Treatment/Exercise - 11/19/15 0903    Transfers   Transfers Sit to Stand;Stand to Sit   Sit to Stand 6: Modified independent (Device/Increase time);Without upper extremity assist;From chair/3-in-1   Stand to Sit 6: Modified independent (Device/Increase time);Without upper extremity assist;To chair/3-in-1   High Level Balance   High Level Balance Comments Reviewed pt's HEP-pt performs corner balance exercise on 2 pillows with feet together eyes closed  10 seconds 3 reps, then head turns/nods x 10 reps 2 sets; postural standing activity at doorframe x 60 seconds (Discussed awareness of posture throughout the day to improve posture.  In parallel bars, compliant surface activities-on blue foam mat:  marching in place, forward kicks, forward step taps, back step taps x 10 reps with UE support.  On rockerboard in anterior/posterior direction-hip/ankle strategy work in anterior and posterior directions, with initial UE support, then UE lifts and head turns/nods.  Hip/ankle strategy work in lateral directions on rockerboard; rockerboard activities performed with min guard assistance, with cues needed for slowed pace and to increase weightshift into posterior direction.     Single limb stance activities in parallel bars with intermittent UE support with 10 second hold, 2 reps each leg.  Then single limb stance with one foot propped/rolling ball, 10-15 seconds 2 reps each leg, with intermittent UE support and min guard assistance.  Stagger stance weightshifting at outside of parallel bars, x 5 reps each foot position, with therapist providing manual and verbal cues, to address pt's reports of pitching forward/losing balance when standing at sink doing dishes.  Also addressed initiating turns (in response to pt's reports of crossing feet causing stumbles) from static standing, widened BOS position-worked on taking BIG step turn and go, with leading foot initiating in the direction of the turn.  PT Education - 11/20/15 0754    Education provided Yes   Education Details Standing positions at sink, initiating turns from static standing position   Person(s) Educated Patient   Methods Explanation;Demonstration;Verbal cues;Handout   Comprehension Verbalized understanding;Returned demonstration;Verbal cues required;Need further instruction          PT Short Term Goals - 11/11/15 1455    PT SHORT TERM GOAL #1   Title Pt will be independent with  HEP to address gait, balance, transfers.  TARGET 11/24/15   Status On-going   PT SHORT TERM GOAL #2   Title Pt will improve 5x sit<>stand test to less than or equal to 12 seconds for improved transfer efficiency and safety.   Status On-going   PT SHORT TERM GOAL #3   Title Pt will improve TUG score to less than or equal to 13.5 seconds for decreased fall risk.   Status On-going   PT SHORT TERM GOAL #4   Title Pt will improve Functional Gait Assessment to at least 14/30 for decreased fall risk.   Status On-going   PT SHORT TERM GOAL #5   Title Pt will verbalize understanding of local Parkinson related resources.   Status On-going           PT Long Term Goals - 11/11/15 1455    PT LONG TERM GOAL #1   Title Pt will verbalize understanding of fall prevention within the home environment.  TARGET 12/27/15   Status On-going   PT LONG TERM GOAL #2   Title Pt will improve gait velocity to at least 2.62 ft/sec for improved efficiency and safety with gait.   Status On-going   PT LONG TERM GOAL #3   Title Pt will improve TUG cognitive to less than or equal to 15 seconds for decreased fall risk.   Status On-going   PT LONG TERM GOAL #4   Title Pt will improve Functional GAit Assessment to at least 19/30 for decreased fall risk.   Status On-going   PT LONG TERM GOAL #5   Title Pt will verbalize plans for continued community fitness upon D/C from PT.   Status On-going               Plan - 11/20/15 0755    Clinical Impression Statement Pt demonstrates decreased use of hip strategy for balance recovery during balance activiities with perturbations on rockerboard in parallel bars.  Transitioned at end of session to functional exercise tasks, including stagger standing at counter and initiating turns, with pt initially verabalizing and demonstrating that this may help with balance (vs initiating turns with crossover step).  Pt will  continue to benefit from further skilled PT to address  balance, posture, gait and functional mobility.   Pt will benefit from skilled therapeutic intervention in order to improve on the following deficits Abnormal gait;Decreased activity tolerance;Decreased balance;Decreased mobility;Decreased endurance;Decreased strength;Difficulty walking;Impaired flexibility;Postural dysfunction   Rehab Potential Good   PT Frequency 2x / week   PT Duration 8 weeks  plus eval   PT Treatment/Interventions ADLs/Self Care Home Management;Therapeutic exercise;Therapeutic activities;Functional mobility training;Gait training;Balance training;Neuromuscular re-education;Patient/family education   PT Next Visit Plan Continue balance activities (SLS, compliant surfaces), gait; incorporate functional activities of weightshifting at counter and turning practice; Check STGs (add appts if needed)   Consulted and Agree with Plan of Care Patient        Problem List Patient Active Problem List   Diagnosis Date Noted  . Parkinson's disease ([place]) 09/28/2015  Loryn Haacke W. 11/20/2015, 7:59 AM Frazier Butt., PT [facility] [address] [place], [place], [address] Phone: [phone]   Fax:  [phone]  Name: Basia Schlender MRN: [id number] Date of Birth: 1945-11-27

## 2015-11-24 ENCOUNTER — Ambulatory Visit: Payer: Medicare Other | Admitting: Physical Therapy

## 2015-11-24 ENCOUNTER — Encounter: Payer: Self-pay | Admitting: Occupational Therapy

## 2015-11-24 ENCOUNTER — Ambulatory Visit: Payer: Medicare Other | Admitting: Occupational Therapy

## 2015-11-24 DIAGNOSIS — R29898 Other symptoms and signs involving the musculoskeletal system: Secondary | ICD-10-CM

## 2015-11-24 DIAGNOSIS — R293 Abnormal posture: Secondary | ICD-10-CM

## 2015-11-24 DIAGNOSIS — R278 Other lack of coordination: Secondary | ICD-10-CM

## 2015-11-24 DIAGNOSIS — R2681 Unsteadiness on feet: Secondary | ICD-10-CM

## 2015-11-24 DIAGNOSIS — R258 Other abnormal involuntary movements: Secondary | ICD-10-CM

## 2015-11-24 DIAGNOSIS — R269 Unspecified abnormalities of gait and mobility: Secondary | ICD-10-CM

## 2015-11-24 DIAGNOSIS — R6889 Other general symptoms and signs: Secondary | ICD-10-CM

## 2015-11-24 DIAGNOSIS — R279 Unspecified lack of coordination: Secondary | ICD-10-CM

## 2015-11-24 NOTE — Therapy (Signed)
[facility] [address] [place], [place], [address] Phone: [phone]   Fax:  [phone]  Physical Therapy Treatment  Patient Details  Name: Ana Salazar MRN: [id number] Date of Birth: Aug 29, 1946 Referring Provider: Wells Guiles Tat  Encounter Date: 11/24/2015      PT End of Session - 11/24/15 0948    Visit Number 6   Number of Visits 17   Date for PT Re-Evaluation 12/27/15   Authorization Type Medicare/[employer] (2nd)-G-code every 10th visit   PT Start Time 0844   PT Stop Time 0932   PT Time Calculation (min) 48 min   Activity Tolerance Patient tolerated treatment well   Behavior During Therapy [facility] for tasks assessed/performed      Past Medical History  Diagnosis Date  . Parkinson's disease ([place])   . Hypertension   . Kidney disease   . Tachycardia   . Depression   . Hypothyroidism   . Seasonal allergies     Past Surgical History  Procedure Laterality Date  . Thyroidectomy, partial    . Abdominal hysterectomy    . Cesarean section    . Tonsillectomy      Filed Vitals:   11/24/15 0900  SpO2: 93%    Visit Diagnosis:  Abnormality of gait  Unsteadiness      Subjective Assessment - 11/24/15 0848    Subjective Denies falls or changes since last visit.  Went walking in the park yesterday but didnt do any hills.   Patient Stated Goals Pt wants helping moving and with balance.   Currently in Pain? No/denies            [facility] PT Assessment - 11/24/15 0852    Functional Gait  Assessment   Gait assessed  Yes   Gait Level Surface Walks 20 ft in less than 7 sec but greater than 5.5 sec, uses assistive device, slower speed, mild gait deviations, or deviates 6-10 in outside of the 12 in walkway width.   Change in Gait Speed Able to change speed, demonstrates mild gait deviations, deviates 6-10 in outside of the 12 in walkway width, or no gait deviations, unable to achieve a major change in  velocity, or uses a change in velocity, or uses an assistive device.   Gait with Horizontal Head Turns Performs head turns with moderate changes in gait velocity, slows down, deviates 10-15 in outside 12 in walkway width but recovers, can continue to walk.  weaves R and L   Gait with Vertical Head Turns Performs task with moderate change in gait velocity, slows down, deviates 10-15 in outside 12 in walkway width but recovers, can continue to walk.  weaves R and L   Gait and Pivot Turn Pivot turns safely within 3 sec and stops quickly with no loss of balance.   Step Over Obstacle Is able to step over one shoe box (4.5 in total height) but must slow down and adjust steps to clear box safely. May require verbal cueing.   Gait with Narrow Base of Support Ambulates less than 4 steps heel to toe or cannot perform without assistance.   Gait with Eyes Closed Walks 20 ft, slow speed, abnormal gait pattern, evidence for imbalance, deviates 10-15 in outside 12 in walkway width. Requires more than 9 sec to ambulate 20 ft.   Ambulating Backwards Walks 20 ft, uses assistive device, slower speed, mild gait deviations, deviates 6-10 in outside 12 in walkway width.   Steps Alternating feet, must use  rail.   Total Score 15                     [facility] Adult PT Treatment/Exercise - 11/24/15 0852    Transfers   Five time sit to stand comments  11.25 seconds   Timed Up and Go Test   TUG Normal TUG;Manual TUG;Cognitive TUG   Normal TUG (seconds) 8.97   Manual TUG (seconds) 9.81   Cognitive TUG (seconds) 9.68   High Level Balance   High Level Balance Activities Other (comment)  Forward, backward and forward<>backward step weight shifting   High Level Balance Comments at counter with UE support;increased difficulty with stepping with L side;several episodes of LOB to R and scissoring of feet.  Also worked on quarter turns at [employer] and chair both clockwise and counterclock wise to 3, 6, 9 and 12 o'clock.    Knee/Hip Exercises: Aerobic   Stationary Bike Scifit level 1.4 all 4 extremities x 8 minutes with rpm>80;  SaO2 98% after Scifit with HE 64.                PT Education - 11/24/15 0945    Education provided Yes   Education Details progress toward goals   Person(s) Educated Patient   Methods Explanation   Comprehension Verbalized understanding          PT Short Term Goals - 11/24/15 0945    PT SHORT TERM GOAL #1   Title Pt will be independent with HEP to address gait, balance, transfers.  TARGET 11/24/15   Status Achieved   PT SHORT TERM GOAL #2   Title Pt will improve 5x sit<>stand test to less than or equal to 12 seconds for improved transfer efficiency and safety.   Baseline 11.25 seconds 11/24/15   Status Achieved   PT SHORT TERM GOAL #3   Title Pt will improve TUG score to less than or equal to 13.5 seconds for decreased fall risk.   Baseline Normal 8.97 sec, manual 9.81 sec, cognitive 9.68 sec on 11/24/15   Status Achieved   PT SHORT TERM GOAL #4   Title Pt will improve Functional Gait Assessment to at least 14/30 for decreased fall risk.   Baseline 15/30 on 11/24/15   Status Achieved   PT SHORT TERM GOAL #5   Title Pt will verbalize understanding of local Parkinson related resources.   Status Achieved           PT Long Term Goals - 11/24/15 0950    PT LONG TERM GOAL #1   Title Pt will verbalize understanding of fall prevention within the home environment.  TARGET 12/27/15   Status On-going   PT LONG TERM GOAL #2   Title Pt will improve gait velocity to at least 2.62 ft/sec for improved efficiency and safety with gait.   Status On-going   PT LONG TERM GOAL #3   Title Pt will improve TUG cognitive to less than or equal to 15 seconds for decreased fall risk.   Baseline 9.68 seconds 11/24/15   Status Achieved   PT LONG TERM GOAL #4   Title Pt will improve Functional GAit Assessment to at least 19/30 for decreased fall risk.   Status On-going   PT LONG  TERM GOAL #5   Title Pt will verbalize plans for continued community fitness upon D/C from PT.   Status On-going               Plan - 11/24/15 3329  Clinical Impression Statement Pt met all STG's and LTG #3.  Continues with decrease balance with gait with head turns/nods along with SLS activities.  Pt's endurance improving as well.  Continue PT per POC.   Pt will benefit from skilled therapeutic intervention in order to improve on the following deficits Abnormal gait;Decreased activity tolerance;Decreased balance;Decreased mobility;Decreased endurance;Decreased strength;Difficulty walking;Impaired flexibility;Postural dysfunction   Rehab Potential Good   PT Frequency 2x / week   PT Duration 8 weeks  plus eval   PT Treatment/Interventions ADLs/Self Care Home Management;Therapeutic exercise;Therapeutic activities;Functional mobility training;Gait training;Balance training;Neuromuscular re-education;Patient/family education   PT Next Visit Plan Continue balance activities (SLS, compliant surfaces), gait; incorporate functional activities of weightshifting at counter and turning practice.   Consulted and Agree with Plan of Care Patient        Problem List Patient Active Problem List   Diagnosis Date Noted  . Parkinson's disease ([place]) 09/28/2015    Narda Bonds 11/24/2015, 9:52 AM  [facility] [address] [place], [place], [address] Phone: [phone]   Fax:  [phone]  Name: Amyjo Mizrachi MRN: [id number] Date of Birth: Jul 21, 1946    Narda Bonds, [place] 11/24/2015 9:52 AM Phone: [phone] Fax: [phone]

## 2015-11-24 NOTE — Patient Instructions (Signed)
Keeping Thinking Skills Sharp: 1. Jigsaw puzzles 2. Card/board games 3. Talking on the phone/social events 4. [email or web address] 5. Online games 6. Word serches/crossword puzzles 7.  Logic puzzles 8. Aerobic exercise (stationary bike) 9. Eating balanced diet (fruits & veggies) 10. Drink water 11. Try something new--new recipe, hobby 12. Crafts 13. Do a variety of activities that are challenging 14. Add cognitive activities to walking/exercising (think of animal/food/city with each letter of the alphabet, counting backwards, thinking of as many vegetables as you can, etc.).--Only do this  If safe (no freezing/falls).  

## 2015-11-24 NOTE — Therapy (Signed)
[facility] [address] [place], [place], [address] Phone: [phone]   Fax:  [phone]  Occupational Therapy Treatment  Patient Details  Name: Ana Salazar MRN: [id number] Date of Birth: 12-29-1945 Referring Provider: Dr. Wells Guiles Tat  Encounter Date: 11/24/2015      OT End of Session - 11/24/15 0942    Visit Number 6   Number of Visits 17   Date for OT Re-Evaluation 12/24/15   Authorization Type Medicare, [employer], needs G-code   Authorization - Visit Number 6   Authorization - Number of Visits 10   OT Start Time [phone]   OT Stop Time 1016   OT Time Calculation (min) 40 min   Activity Tolerance Patient tolerated treatment well   Behavior During Therapy [facility] for tasks assessed/performed      Past Medical History  Diagnosis Date  . Parkinson's disease ([place])   . Hypertension   . Kidney disease   . Tachycardia   . Depression   . Hypothyroidism   . Seasonal allergies     Past Surgical History  Procedure Laterality Date  . Thyroidectomy, partial    . Abdominal hysterectomy    . Cesarean section    . Tonsillectomy      There were no vitals filed for this visit.  Visit Diagnosis:  Bradykinesia  Rigidity  Decreased coordination  Decreased functional activity tolerance  Abnormal posture      Subjective Assessment - 11/24/15 0941    Subjective  Pt reports that she cannot sit still (incr dyskinesias)   Limitations PD diagnosis 2014; pt reports hx of multiple TIAs   Patient Stated Goals I want to be able to raise my arm more and move better.     Currently in Pain? No/denies        Simulated ADLs with bag to focus on large amplitude movements:  Donning/doffing pants, donning shirt, fastening bra (behind pants), clothing adjustment/donning socks with min cueing.  Pt appeared out of breath during activities but O2 stats were 93-99% during session.  Began checking STGs and discussing  progress--see below                       OT Education - 11/24/15 1005    Education Details Community Resources related to PD; nonmotor symptoms of PD (to ask MD about and upcoming webinars for bladder, sweating, and runny nose in PD); benefits of aerobic exercise (safe options and to gradually incr time); Keeping thinking skills sharp   Person(s) Educated Patient   Methods Explanation;Handout   Comprehension Verbalized understanding          OT Short Term Goals - 11/24/15 1010    OT SHORT TERM GOAL #1   Title Pt will be independent with PD-specific HEP.--check STGs 11/24/15   Time 4   Period Weeks   Status New   OT SHORT TERM GOAL #2   Title Pt will verbalize understanding of ways to prevent future complications related to PD and appropriate community resources prn.   Time 4   Period Weeks   Status Achieved  met. 11/24/15   OT SHORT TERM GOAL #3   Title Pt will improve functional reaching/balance for ADLs/IADLs as shown by improving standing functional reach test by at least 2 inches bilaterally.   Baseline R-5.5", L-7"   Time 4   Period Weeks   Status On-going  11/24/15:  R-13", L-13" will reassess next session for consistency  OT SHORT TERM GOAL #4   Title Pt will improve coordination for ADLs as shown by improving time on 9-hole peg test by at least 5 sec with dominant RUE.   Baseline 38.97sec   Period Weeks   Status New  11/24/15:  25.72sec, but will reassess next session to ensure consistency   OT SHORT TERM GOAL #5   Title Pt will report incr ease with peeling food and opening packages/containers.   Time 4   Period Weeks   Status Achieved  met 11/24/15   OT SHORT TERM GOAL #6   Title Pt will write at least 3 sentences with no significant decr in size and 100% legibility over at least 2 sessions.   Time 4   Period Weeks   Status New           OT Long Term Goals - 10/27/15 1823    OT LONG TERM GOAL #1   Title Pt will verbalize understanding  of AE/strategies to incr ease with ADLs/IADLs.--check LTGs 12/24/15   Time 8   Period Weeks   Status New   OT LONG TERM GOAL #2   Title Pt will improve bilateral hand coordination/functional reach as shown by improving score on box and blocks test by at least 4 bilaterally.   Baseline R-37 blocks, L-39 blocks   Time 8   Period Weeks   Status New   OT LONG TERM GOAL #3   Title Pt will improve balance/functional reaching for ADLs/IADLs as shown by improving score on standing functional reach test by at least 3 inches with RUE.   Baseline 5.5"   Time 8   Period Weeks   Status New   OT LONG TERM GOAL #4   Title Pt will improve coordination for ADLs as shown by improving time on 9-hole peg test by at least 10 sec with dominant RUE.   Time 8   Period Weeks   Status New   OT LONG TERM GOAL #5   Title Pt will be able to carry object in both hands with turns and backing up 1-2steps without LOB or spills to simulate kitchen tasks using big movement strategies.   Time 8   Period Weeks   Status New               Plan - 11/24/15 7416    Clinical Impression Statement Pt is progressing towards goals.  Pt reports HEP performance and attempting to use larger amplitude movements at home.   Pt will benefit from skilled therapeutic intervention in order to improve on the following deficits (Retired) Decreased mobility;Impaired UE functional use;Decreased knowledge of use of DME;Decreased balance;Decreased activity tolerance;Impaired tone;Decreased range of motion;Decreased coordination   Rehab Potential Good   OT Frequency 2x / week   OT Duration 8 weeks   OT Treatment/Interventions Self-care/ADL training;Neuromuscular education;Therapeutic exercise;Functional Mobility Training;Patient/family education;Balance training;Splinting;Manual Therapy;Therapeutic exercises;Energy conservation;Ultrasound;Cryotherapy;DME and/or AE instruction;Therapeutic activities;Cognitive  remediation/compensation;Passive range of motion;Electrical Stimulation;Moist Heat   Plan check remaining STGs (re-check functional reach and 9-hole peg test to ensure consistency)   OT Home Exercise Plan issued supine and seated PWR!, coordination HEP/PWR! hands (basic 4), PWR! hands, issued   Consulted and Agree with Plan of Care Patient        Problem List Patient Active Problem List   Diagnosis Date Noted  . Parkinson's disease ([place]) 09/28/2015    [facility] 11/24/2015, 12:53 PM  [facility] [address] [place], [place], [address] Phone: [phone]  Fax:  [phone]  Name: Ana Salazar MRN: [id number] Date of Birth: 1946-08-30  Vianne Bulls, OTR/L [facility] [address] [place], [place]  [address] [phone] phone [phone] 11/24/2015 12:53 PM

## 2015-11-26 ENCOUNTER — Ambulatory Visit: Payer: Medicare Other | Admitting: Physical Therapy

## 2015-11-26 ENCOUNTER — Ambulatory Visit: Payer: Medicare Other | Admitting: Occupational Therapy

## 2015-11-26 DIAGNOSIS — R258 Other abnormal involuntary movements: Secondary | ICD-10-CM

## 2015-11-26 DIAGNOSIS — R293 Abnormal posture: Secondary | ICD-10-CM

## 2015-11-26 DIAGNOSIS — R269 Unspecified abnormalities of gait and mobility: Secondary | ICD-10-CM

## 2015-11-26 DIAGNOSIS — R278 Other lack of coordination: Secondary | ICD-10-CM

## 2015-11-26 DIAGNOSIS — R2681 Unsteadiness on feet: Secondary | ICD-10-CM

## 2015-11-26 DIAGNOSIS — R279 Unspecified lack of coordination: Secondary | ICD-10-CM

## 2015-11-26 DIAGNOSIS — R29898 Other symptoms and signs involving the musculoskeletal system: Secondary | ICD-10-CM

## 2015-11-26 DIAGNOSIS — R6889 Other general symptoms and signs: Secondary | ICD-10-CM

## 2015-11-26 NOTE — Patient Instructions (Signed)
Turning in Place/Quarter turn: Leading with head turn    Standing on grass, lead with head and turn slowly, making quarter turns toward left. (think 3:00, 6:00, 9:00, 12:00).  The direction that you are turning, you want to lead with that foot. Repeat _3___ times clockwise, then 3 times counterclockwise per session. Do _1-2___ sessions per day.  Copyright  VHI. All rights reserved.  Marching In-Place TURNS   Standing straight, alternate bringing knees toward trunk. March in place, lifting your legs, one and then the other, to turn to face the direction you need to go. Do _3 times clockwise, then 3 times counterclockwise. Do ___1-2 times per day.  Copyright  VHI. All rights reserved.   Turning with "STEP AND GO" -If you have been standing in one place for some time and you need to turn to change directions to start walking, take a BIG "peel out" step leading the direction you want to go, then start walking with BIG steps ("STEP AND GO")

## 2015-11-26 NOTE — Therapy (Addendum)
[facility] [address] [place], [place], [address] Phone: [phone]   Fax:  [phone]  Occupational Therapy Treatment  Patient Details  Name: Ana Salazar MRN: [id number] Date of Birth: 12/09/1945 Referring Provider: Dr. Wells Guiles Tat  Encounter Date: 11/26/2015      OT End of Session - 11/26/15 1252    Visit Number 7   Number of Visits 17   Date for OT Re-Evaluation 12/24/15   Authorization Type Medicare, [employer], needs G-code   Authorization - Visit Number 7   Authorization - Number of Visits 10   OT Start Time [phone]   OT Stop Time 1015   OT Time Calculation (min) 42 min   Activity Tolerance Patient tolerated treatment well   Behavior During Therapy [facility] for tasks assessed/performed      Past Medical History  Diagnosis Date  . Parkinson's disease ([place])   . Hypertension   . Kidney disease   . Tachycardia   . Depression   . Hypothyroidism   . Seasonal allergies     Past Surgical History  Procedure Laterality Date  . Thyroidectomy, partial    . Abdominal hysterectomy    . Cesarean section    . Tonsillectomy      There were no vitals filed for this visit.  Visit Diagnosis:  Bradykinesia  Rigidity  Decreased coordination  Decreased functional activity tolerance  Abnormal posture  Unsteadiness      Subjective Assessment - 11/26/15 1252    Subjective  "I am happy that I could do that"  (get up from floor)   Limitations PD diagnosis 2014; pt reports hx of multiple TIAs   Patient Stated Goals I want to be able to raise my arm more and move better.     Currently in Pain? No/denies                      OT Treatments/Exercises (OP) - 11/26/15 1306    ADLs   Writing Practiced writing.  Pt able to write 5 sentences with good legibility and size.  Checked STGs and discussed progress.   Neurological Re-education Exercises   Other Exercises 1 In sitting, tossing scarves  using big amplitude movements with min cueing (incorporating trunk rotation and wt. shift) with each UE (reaching laterally and out to the side, then tossing in air and catching while alternating UEs,  then "juggling" 2 with therapist)   Other Exercises 2 PWR! moves in quadraped (up, rock, step) with min cueing for large amplitude and emphasis on stop/start of each movement with min cues and CGA x5-8 reps each.  Then transitioned from [facility]! step to floor>standing transfer using chair for support and CGA--min A.       Visual tracking up/down with difficulty particularly tracking down where pt would lose target or move head (even with cues, pt demo difficulty with tracking without head movements due to dyskinesias)                                      OT Education - 11/26/15 1346    Education Details Emphasis on start/stop of each movement to prevent decr in movement size   Person(s) Educated Patient   Methods Explanation;Demonstration   Comprehension Verbalized understanding;Returned demonstration;Verbal cues required          OT Short Term Goals - 11/26/15 0932  OT SHORT TERM GOAL #1   Title Pt will be independent with PD-specific HEP.--check STGs 11/24/15   Time 4   Period Weeks   Status New   OT SHORT TERM GOAL #2   Title Pt will verbalize understanding of ways to prevent future complications related to PD and appropriate community resources prn.   Time 4   Period Weeks   Status Achieved  met. 11/24/15   OT SHORT TERM GOAL #3   Title Pt will improve functional reaching/balance for ADLs/IADLs as shown by improving standing functional reach test by at least 2 inches bilaterally.   Baseline R-5.5", L-7"   Time 4   Period Weeks   Status Achieved  11/24/15:  R-13", L-13" will reassess next session for consistency; 11/26/15:  R-12", L-12"   OT SHORT TERM GOAL #4   Title Pt will improve coordination for ADLs as shown by improving time on 9-hole peg test by at least 5 sec  with dominant RUE.   Baseline 38.97sec   Period Weeks   Status Achieved  11/24/15:  25.72sec, but will reassess next session to ensure consistency; 11/26/15 19.72sec   OT SHORT TERM GOAL #5   Title Pt will report incr ease with peeling food and opening packages/containers.   Time 4   Period Weeks   Status Achieved  met 11/24/15   OT SHORT TERM GOAL #6   Title Pt will write at least 3 sentences with no significant decr in size and 100% legibility over at least 2 sessions.   Time 4   Period Weeks   Status Achieved  11/26/15           OT Long Term Goals - 11/26/15 0939    OT LONG TERM GOAL #1   Title Pt will verbalize understanding of AE/strategies to incr ease with ADLs/IADLs.--check LTGs 12/24/15   Time 8   Period Weeks   Status New   OT LONG TERM GOAL #2   Title Pt will improve bilateral hand coordination/functional reach as shown by improving score on box and blocks test by at least 4 bilaterally.   Baseline R-37 blocks, L-39 blocks   Time 8   Period Weeks   Status New   OT LONG TERM GOAL #3   Title Pt will improve balance/functional reaching for ADLs/IADLs as shown by improving score on standing functional reach test by at least 3 inches with RUE.   Baseline 5.5"   Time 8   Period Weeks   Status Achieved  11/26/15:  12"   OT LONG TERM GOAL #4   Title Pt will improve coordination for ADLs as shown by improving time on 9-hole peg test by at least 10 sec with dominant RUE.   Time 8   Period Weeks   Status Achieved  11/26/15:  19.72sec   OT LONG TERM GOAL #5   Title Pt will be able to carry object in both hands with turns and backing up 1-2steps without LOB or spills to simulate kitchen tasks using big movement strategies.   Time 8   Period Weeks   Status New               Plan - 11/26/15 0940    Clinical Impression Statement Pt is making good progress towards goals with improved coordination and balance.   Plan continue with strategies for ADLs/IADLs; focus on  timing of movement and deliberate start/stop to movements.   OT Home Exercise Plan issued supine and seated PWR!, coordination  HEP/PWR! hands (basic 4), PWR! hands, issued   Consulted and Agree with Plan of Care Patient        Problem List Patient Active Problem List   Diagnosis Date Noted  . Parkinson's disease ([place]) 09/28/2015    [facility] 11/26/2015, 1:47 PM  [facility] [address] [place], [place], [address] Phone: [phone]   Fax:  [phone]  Name: Ana Salazar MRN: [id number] Date of Birth: 09/09/1945  Vianne Bulls, OTR/L [facility] [address] [place], [place]  [address] [phone] phone [phone] 11/26/2015 1:47 PM

## 2015-11-27 NOTE — Therapy (Signed)
[facility] [address] [place], [place], [address] Phone: [phone]   Fax:  [phone]  Physical Therapy Treatment  Patient Details  Name: Ana Salazar MRN: [id number] Date of Birth: [birth year] Referring Provider: Wells Guiles Tat  Encounter Date: 11/26/2015      PT End of Session - 11/27/15 0837    Visit Number 7   Number of Visits 17   Date for PT Re-Evaluation 12/27/15   Authorization Type Medicare/[employer] (2nd)-G-code every 10th visit   PT Start Time 0849   PT Stop Time 0929   PT Time Calculation (min) 40 min   Activity Tolerance Patient tolerated treatment well   Behavior During Therapy [facility] for tasks assessed/performed      Past Medical History  Diagnosis Date  . Parkinson's disease ([place])   . Hypertension   . Kidney disease   . Tachycardia   . Depression   . Hypothyroidism   . Seasonal allergies     Past Surgical History  Procedure Laterality Date  . Thyroidectomy, partial    . Abdominal hysterectomy    . Cesarean section    . Tonsillectomy      There were no vitals filed for this visit.  Visit Diagnosis:  Abnormality of gait  Postural instability      Subjective Assessment - 11/26/15 0851    Subjective Felt proud of meeting my goals last visit; tending to have more energy and walk better.  Still having difficulty with turns.   Patient Stated Goals Pt wants helping moving and with balance.   Currently in Pain? No/denies                         [facility] Adult PT Treatment/Exercise - 11/26/15 0853    Transfers   Transfers Sit to Stand;Stand to Sit   Sit to Stand 6: Modified independent (Device/Increase time);Without upper extremity assist;From chair/3-in-1   Stand to Sit 6: Modified independent (Device/Increase time);Without upper extremity assist;To chair/3-in-1   Number of Reps 10 reps;Other sets (comment)  from 20", then 18" surfaces   High Level Balance   High  Level Balance Activities Turns;Head turns;Marching turns  Quarter turns, step and go turns, w/ head turn/visual target   High Level Balance Comments Practiced varying turning methods at counter with counter/chair for support, multiple reps clockwise and counterclockwise quarter turns and marching turns.  Pt reports improved feeling of stability with addition of head turn/eyes to visual target ahead of turning.  Practiced step and go turns at counter and varied places in gym area with no LOB.  Pt responds well to cues for these turning strategies.  Added as HEP, with instructions to try these turning strategies throughout the day for improved function             Balance Exercises - 11/26/15 0900    Balance Exercises: Standing   SLS Eyes open;Solid surface;Upper extremity support 1;3 reps;10 secs   Stepping Strategy Anterior;Posterior;Lateral;Foam/compliant surface;UE support;10 reps  alternating legs intermittent UE support at counter   Marching Limitations Marching in place on foam, intermittent UE support, 10 reps each leg, with cues to widen narrow BOS   Other Standing Exercises step forward/back off foam, 10 reps each leg           PT Education - 11/27/15 0836    Education provided Yes   Education Details Strategies for improved turning; need to practice exercises but also use strategies throughout day  to improve function   Person(s) Educated Patient   Methods Explanation;Demonstration;Handout   Comprehension Verbalized understanding;Returned demonstration;Verbal cues required;Need further instruction          PT Short Term Goals - 11/24/15 0945    PT SHORT TERM GOAL #1   Title Pt will be independent with HEP to address gait, balance, transfers.  TARGET 11/24/15   Status Achieved   PT SHORT TERM GOAL #2   Title Pt will improve 5x sit<>stand test to less than or equal to 12 seconds for improved transfer efficiency and safety.   Baseline 11.25 seconds 11/24/15   Status  Achieved   PT SHORT TERM GOAL #3   Title Pt will improve TUG score to less than or equal to 13.5 seconds for decreased fall risk.   Baseline Normal 8.97 sec, manual 9.81 sec, cognitive 9.68 sec on 11/24/15   Status Achieved   PT SHORT TERM GOAL #4   Title Pt will improve Functional Gait Assessment to at least 14/30 for decreased fall risk.   Baseline 15/30 on 11/24/15   Status Achieved   PT SHORT TERM GOAL #5   Title Pt will verbalize understanding of local Parkinson related resources.   Status Achieved           PT Long Term Goals - 11/24/15 0950    PT LONG TERM GOAL #1   Title Pt will verbalize understanding of fall prevention within the home environment.  TARGET 12/27/15   Status On-going   PT LONG TERM GOAL #2   Title Pt will improve gait velocity to at least 2.62 ft/sec for improved efficiency and safety with gait.   Status On-going   PT LONG TERM GOAL #3   Title Pt will improve TUG cognitive to less than or equal to 15 seconds for decreased fall risk.   Baseline 9.68 seconds 11/24/15   Status Achieved   PT LONG TERM GOAL #4   Title Pt will improve Functional GAit Assessment to at least 19/30 for decreased fall risk.   Status On-going   PT LONG TERM GOAL #5   Title Pt will verbalize plans for continued community fitness upon D/C from PT.   Status On-going               Plan - 11/27/15 [id number]    Clinical Impression Statement Pt responds well to turning strategies, especially with incorporation of head turns to visual target prior to turning body.  Encouraged dedicated practice and implementation of these strategies into daily routine for improved functional mobility, but pt questions ability to slow down/be aware of these movement patterns enough to make a difference.  Pt will continue to benefit from further skilled PT to address balance, gait, functional mobility.   Pt will benefit from skilled therapeutic intervention in order to improve on the following deficits  Abnormal gait;Decreased activity tolerance;Decreased balance;Decreased mobility;Decreased endurance;Decreased strength;Difficulty walking;Impaired flexibility;Postural dysfunction   Rehab Potential Good   PT Frequency 2x / week   PT Duration 8 weeks  plus eval   PT Treatment/Interventions ADLs/Self Care Home Management;Therapeutic exercise;Therapeutic activities;Functional mobility training;Gait training;Balance training;Neuromuscular re-education;Patient/family education   PT Next Visit Plan Review turning activities as part of 11/26/15 HEP; continue to work on starts/stops, turns, change of direction and weigthshifting for improved balance.   Consulted and Agree with Plan of Care Patient        Problem List Patient Active Problem List   Diagnosis Date Noted  . Parkinson's disease ([place]) 09/28/2015  MARRIOTT,AMY W. 11/27/2015, 8:41 AM  Frazier Butt., PT [facility] [address] [place], [place], [address] Phone: [phone]   Fax:  [phone]  Name: Jaeliana Glazer MRN: [id number] Date of Birth: [birth year]

## 2015-12-01 ENCOUNTER — Ambulatory Visit: Payer: Medicare Other | Admitting: Physical Therapy

## 2015-12-01 ENCOUNTER — Ambulatory Visit: Payer: Medicare Other | Admitting: Occupational Therapy

## 2015-12-01 DIAGNOSIS — R279 Unspecified lack of coordination: Secondary | ICD-10-CM

## 2015-12-01 DIAGNOSIS — R278 Other lack of coordination: Secondary | ICD-10-CM

## 2015-12-01 DIAGNOSIS — R2681 Unsteadiness on feet: Secondary | ICD-10-CM

## 2015-12-01 DIAGNOSIS — R258 Other abnormal involuntary movements: Secondary | ICD-10-CM

## 2015-12-01 DIAGNOSIS — R293 Abnormal posture: Secondary | ICD-10-CM

## 2015-12-01 DIAGNOSIS — R29898 Other symptoms and signs involving the musculoskeletal system: Secondary | ICD-10-CM

## 2015-12-01 DIAGNOSIS — R6889 Other general symptoms and signs: Secondary | ICD-10-CM

## 2015-12-01 DIAGNOSIS — R269 Unspecified abnormalities of gait and mobility: Secondary | ICD-10-CM

## 2015-12-01 NOTE — Therapy (Signed)
[facility] [address] [place], [place], [address] Phone: [phone]   Fax:  [phone]  Occupational Therapy Treatment  Patient Details  Name: Ana Salazar MRN: [id number] Date of Birth: July 12, 1946 Referring Provider: Dr. Lurena Joiner Tat  Encounter Date: 12/01/2015      OT End of Session - 12/01/15 0938    Visit Number 8   Number of Visits 17   Date for OT Re-Evaluation 12/24/15   Authorization Type Medicare, [employer], needs G-code   Authorization - Visit Number 8   Authorization - Number of Visits 10   OT Start Time [phone]   OT Stop Time 1015   OT Time Calculation (min) 39 min   Activity Tolerance Patient tolerated treatment well   Behavior During Therapy [facility] for tasks assessed/performed      Past Medical History  Diagnosis Date  . Parkinson's disease ([facility])   . Hypertension   . Kidney disease   . Tachycardia   . Depression   . Hypothyroidism   . Seasonal allergies     Past Surgical History  Procedure Laterality Date  . Thyroidectomy, partial    . Abdominal hysterectomy    . Cesarean section    . Tonsillectomy      There were no vitals filed for this visit.  Visit Diagnosis:  Bradykinesia  Rigidity  Decreased coordination  Decreased functional activity tolerance  Abnormal posture  Unsteadiness      Subjective Assessment - 12/01/15 0937    Subjective  "I'm doing fine"   Limitations PD diagnosis 2014; pt reports hx of multiple TIAs   Patient Stated Goals I want to be able to raise my arm more and move better.     Currently in Pain? No/denies                      OT Treatments/Exercises (OP) - 12/01/15 0001    ADLs   Writing Copied 5 sentences with good legibility and size.  Then added cognitive component to perform category generation and writing with good legiblity and size.       Functional Mobility for ADLs:  Carrying plate of loose objects while ambulating  to retrieve objects in various locations with turns/direction changes in small space to simulate kitchen activities with no LOB or freezing or drops.           OT Education - 12/01/15 1718    Education Details Keeping Thinking Skills [place] Mody; Memory compensation strategies   Person(s) Educated Patient   Methods Explanation;Handout   Comprehension Verbalized understanding          OT Short Term Goals - 12/01/15 0939    OT SHORT TERM GOAL #1   Title Pt will be independent with PD-specific HEP.--check STGs 11/24/15   Time 4   Period Weeks   Status On-going  would benefit from reinforcement   OT SHORT TERM GOAL #2   Title Pt will verbalize understanding of ways to prevent future complications related to PD and appropriate community resources prn.   Time 4   Period Weeks   Status Achieved  met. 11/24/15   OT SHORT TERM GOAL #3   Title Pt will improve functional reaching/balance for ADLs/IADLs as shown by improving standing functional reach test by at least 2 inches bilaterally.   Baseline R-5.5", L-7"   Time 4   Period Weeks   Status Achieved  11/24/15:  R-13", L-13" will reassess next session for consistency; 11/26/15:  R-12", L-12"   OT SHORT TERM GOAL #4   Title Pt will improve coordination for ADLs as shown by improving time on 9-hole peg test by at least 5 sec with dominant RUE.   Baseline 38.97sec   Period Weeks   Status Achieved  11/24/15:  25.72sec, but will reassess next session to ensure consistency; 11/26/15 19.72sec   OT SHORT TERM GOAL #5   Title Pt will report incr ease with peeling food and opening packages/containers.   Time 4   Period Weeks   Status Achieved  met 11/24/15   OT SHORT TERM GOAL #6   Title Pt will write at least 3 sentences with no significant decr in size and 100% legibility over at least 2 sessions.   Time 4   Period Weeks   Status Achieved  11/26/15           OT Long Term Goals - 11/26/15 0939    OT LONG TERM GOAL #1   Title Pt will  verbalize understanding of AE/strategies to incr ease with ADLs/IADLs.--check LTGs 12/24/15   Time 8   Period Weeks   Status New   OT LONG TERM GOAL #2   Title Pt will improve bilateral hand coordination/functional reach as shown by improving score on box and blocks test by at least 4 bilaterally.   Baseline R-37 blocks, L-39 blocks   Time 8   Period Weeks   Status New   OT LONG TERM GOAL #3   Title Pt will improve balance/functional reaching for ADLs/IADLs as shown by improving score on standing functional reach test by at least 3 inches with RUE.   Baseline 5.5"   Time 8   Period Weeks   Status Achieved  11/26/15:  12"   OT LONG TERM GOAL #4   Title Pt will improve coordination for ADLs as shown by improving time on 9-hole peg test by at least 10 sec with dominant RUE.   Time 8   Period Weeks   Status Achieved  11/26/15:  19.72sec   OT LONG TERM GOAL #5   Title Pt will be able to carry object in both hands with turns and backing up 1-2steps without LOB or spills to simulate kitchen tasks using big movement strategies.   Time 8   Period Weeks   Status New               Plan - 12/01/15 2671    Clinical Impression Statement Pt continues to progress with coordination and balance.   Plan continue with strategies for ADLs/IADLs   OT Home Exercise Plan issued supine and seated PWR!, coordination HEP/PWR! hands (basic 4), PWR! hands, issued   Consulted and Agree with Plan of Care Patient        Problem List Patient Active Problem List   Diagnosis Date Noted  . Parkinson's disease ([place]) 09/28/2015    [facility] 12/01/2015, 5:20 PM  [facility] [address] [place], [place], [address] Phone: [phone]   Fax:  [phone]  Name: Ana Salazar MRN: [id number] Date of Birth: [birth year]  Vianne Bulls, OTR/L [facility] [address] [place], [place]   [address] [phone] phone [phone] 12/01/2015 5:20 PM

## 2015-12-01 NOTE — Therapy (Signed)
[facility] [address] [place], [place], [address] Phone: [phone]   Fax:  [phone]  Physical Therapy Treatment  Patient Details  Name: Ana Salazar MRN: [id number] Date of Birth: 07-Mar-1946 Referring Provider: Wells Guiles Tat  Encounter Date: 12/01/2015      PT End of Session - 12/01/15 0935    Visit Number 8   Number of Visits 17   Date for PT Re-Evaluation 12/27/15   Authorization Type Medicare/[employer] (2nd)-G-code every 10th visit   PT Start Time 0850   PT Stop Time 0929   PT Time Calculation (min) 39 min   Equipment Utilized During Treatment Gait belt   Activity Tolerance Patient tolerated treatment well   Behavior During Therapy [facility] for tasks assessed/performed      Past Medical History  Diagnosis Date  . Parkinson's disease ([place])   . Hypertension   . Kidney disease   . Tachycardia   . Depression   . Hypothyroidism   . Seasonal allergies     Past Surgical History  Procedure Laterality Date  . Thyroidectomy, partial    . Abdominal hysterectomy    . Cesarean section    . Tonsillectomy      There were no vitals filed for this visit.  Visit Diagnosis:  Abnormality of gait  Unsteadiness  Postural instability      Subjective Assessment - 12/01/15 0853    Subjective No falls since last visit; practiced turning strategies-sometimes does well and sometimes it happens too fast.   Patient Stated Goals Pt wants helping moving and with balance.   Currently in Pain? No/denies                         [facility] Adult PT Treatment/Exercise - 12/01/15 0859    Ambulation/Gait   Ambulation/Gait Yes   Ambulation/Gait Assistance 5: Supervision   Ambulation Distance (Feet) 600 Feet   Assistive device None   Gait Pattern Step-through pattern;Decreased arm swing - right;Decreased arm swing - left;Decreased step length - right;Decreased step length - left;Decreased trunk  rotation;Poor foot clearance - left;Poor foot clearance - right;Narrow base of support   Ambulation Surface Level;Indoor   Pre-Gait Activities Slowed gait noted with cognitive task (naming foods A-S)   Gait Comments Gait activities in gym area, including walking with turns to pick up and carry objects; pt keeps wide BOS and does not experience LOB with supervision.  Pt does needs cues for incorporating head turns/use of visual targets with turns.  Gait and turns with bilateral carrying and conversation tasks with supervision, with increased time noted with turning tasks.    High Level Balance   High Level Balance Activities Turns;Head turns;Marching turns  Quarter turns, step and go turns, cues to look at vis target             Balance Exercises - 12/01/15 0933    Balance Exercises: Standing   Standing Eyes Opened Wide (BOA);Head turns;Foam/compliant surface;5 reps  head nods x 5 reps; intermittent UE support   Stepping Strategy Anterior;Posterior;Lateral;Foam/compliant surface;10 reps  alternating legs, intermittent UE support at counter   Marching Limitations Marching in place on foam, intermittent UE support, 10 reps each leg, with cues to widen narrow BOS   Other Standing Exercises step forward/back off foam, 10 reps each leg           PT Education - 12/01/15 0935    Education provided Yes   Education Details Reviewed  turning strategies as well as need for pacing, to slow down and to use head turns/visual targets to assist with better balance with turns   Person(s) Educated Patient   Methods Explanation;Demonstration;Handout;Verbal cues   Comprehension Verbalized understanding;Returned demonstration;Verbal cues required          PT Short Term Goals - 11/24/15 0945    PT SHORT TERM GOAL #1   Title Pt will be independent with HEP to address gait, balance, transfers.  TARGET 11/24/15   Status Achieved   PT SHORT TERM GOAL #2   Title Pt will improve 5x sit<>stand test to  less than or equal to 12 seconds for improved transfer efficiency and safety.   Baseline 11.25 seconds 11/24/15   Status Achieved   PT SHORT TERM GOAL #3   Title Pt will improve TUG score to less than or equal to 13.5 seconds for decreased fall risk.   Baseline Normal 8.97 sec, manual 9.81 sec, cognitive 9.68 sec on 11/24/15   Status Achieved   PT SHORT TERM GOAL #4   Title Pt will improve Functional Gait Assessment to at least 14/30 for decreased fall risk.   Baseline 15/30 on 11/24/15   Status Achieved   PT SHORT TERM GOAL #5   Title Pt will verbalize understanding of local Parkinson related resources.   Status Achieved           PT Long Term Goals - 11/24/15 0950    PT LONG TERM GOAL #1   Title Pt will verbalize understanding of fall prevention within the home environment.  TARGET 12/27/15   Status On-going   PT LONG TERM GOAL #2   Title Pt will improve gait velocity to at least 2.62 ft/sec for improved efficiency and safety with gait.   Status On-going   PT LONG TERM GOAL #3   Title Pt will improve TUG cognitive to less than or equal to 15 seconds for decreased fall risk.   Baseline 9.68 seconds 11/24/15   Status Achieved   PT LONG TERM GOAL #4   Title Pt will improve Functional GAit Assessment to at least 19/30 for decreased fall risk.   Status On-going   PT LONG TERM GOAL #5   Title Pt will verbalize plans for continued community fitness upon D/C from PT.   Status On-going               Plan - 12/01/15 0936    Clinical Impression Statement Pt demonstrates good understanding of turning strategies, but does report dual tasking and distractibility, "being in a rush" make turning strategies more difficult at home.  Progressed with dual tasking and cognitive activities with gait and turns today (increased slowing noted).  Pt will continue to benefit from further skilled PT to address balance, gait, posture, functional mobility.   Pt will benefit from skilled therapeutic  intervention in order to improve on the following deficits Abnormal gait;Decreased activity tolerance;Decreased balance;Decreased mobility;Decreased endurance;Decreased strength;Difficulty walking;Impaired flexibility;Postural dysfunction   Rehab Potential Good   PT Frequency 2x / week   PT Duration 8 weeks  plus eval   PT Treatment/Interventions ADLs/Self Care Home Management;Therapeutic exercise;Therapeutic activities;Functional mobility training;Gait training;Balance training;Neuromuscular re-education;Patient/family education   PT Next Visit Plan R continue to work on starts/stops, turns, change of direction and weigthshifting for improved balance, with added cognitive and dual tasking; compliant surfaces.   Consulted and Agree with Plan of Care Patient        Problem List Patient Active Problem List  Diagnosis Date Noted  . Parkinson's disease ([place]) 09/28/2015    Chakara Bognar W. 12/01/2015, 9:41 AM  Frazier Butt., PT  [facility] [address] [place], [place], [address] Phone: [phone]   Fax:  [phone]  Name: Malayla Benedum MRN: [id number] Date of Birth: 1945/10/25

## 2015-12-01 NOTE — Patient Instructions (Signed)
Keeping Thinking Skills [place]: 1. Jigsaw puzzles 2. Card/board games 3. Talking on the phone/social events 4. [email or web address] 5. Online games 6. Word serches/crossword puzzles 7.  Logic puzzles 8. Aerobic exercise (stationary bike) 9. Eating balanced diet (fruits & veggies) 10. Drink water 11. Try something new--new recipe, hobby 12. Crafts 13. Do a variety of activities that are challenging 14. Add cognitive activities to walking/exercising (think of animal/food/city with each letter of the alphabet, counting backwards, thinking of as many vegetables as you can, etc.).--Only do this  If safe (no freezing/falls). Memory Compensation Strategies  1. Use "WARM" strategy.  W= write it down  A= associate it  R= repeat it  M= make a mental note  2.   You can keep a Memory Notebook.  Use a 3-ring notebook with sections for the following: calendar, important names and phone numbers,  medications, doctors' names/phone numbers, lists/reminders, and a section to journal what you did  each day.   3.    Use a calendar to write appointments down.  4.    Write yourself a schedule for the day.  This can be placed on the calendar or in a separate section of the Memory Notebook.  Keeping a  regular schedule can help memory.  5.    Use medication organizer with sections for each day or morning/evening pills.  You may need help loading it  6.    Keep a basket, or pegboard by the door.  Place items that you need to take out with you in the basket or on the pegboard.  You may also want to  include a message board for reminders.  7.    Use sticky notes.  Place sticky notes with reminders in a place where the task is performed.  For example: " turn off the  stove" placed by the stove, "lock the door" placed on the door at eye level, " take your medications" on  the bathroom mirror or by the place where you normally take your medications.  8.    Use alarms/timers.  Use while cooking to remind yourself  to check on food or as a reminder to take your medicine, or as a  reminder to make a call, or as a reminder to perform another task, etc.  

## 2015-12-02 ENCOUNTER — Ambulatory Visit: Payer: Medicare Other | Admitting: Physical Therapy

## 2015-12-02 ENCOUNTER — Ambulatory Visit: Payer: Medicare Other | Admitting: Occupational Therapy

## 2015-12-02 DIAGNOSIS — R279 Unspecified lack of coordination: Secondary | ICD-10-CM

## 2015-12-02 DIAGNOSIS — R29898 Other symptoms and signs involving the musculoskeletal system: Secondary | ICD-10-CM

## 2015-12-02 DIAGNOSIS — R269 Unspecified abnormalities of gait and mobility: Secondary | ICD-10-CM

## 2015-12-02 DIAGNOSIS — R6889 Other general symptoms and signs: Secondary | ICD-10-CM

## 2015-12-02 DIAGNOSIS — R278 Other lack of coordination: Secondary | ICD-10-CM

## 2015-12-02 DIAGNOSIS — R2681 Unsteadiness on feet: Secondary | ICD-10-CM

## 2015-12-02 DIAGNOSIS — R258 Other abnormal involuntary movements: Secondary | ICD-10-CM | POA: Diagnosis not present

## 2015-12-02 NOTE — Therapy (Signed)
[facility] [address] [place], [place], [address] Phone: [phone]   Fax:  [phone]  Physical Therapy Treatment  Patient Details  Name: Ana Salazar MRN: [id number] Date of Birth: 11-Feb-1946 Referring Provider: Wells Guiles Tat  Encounter Date: 12/02/2015      PT End of Session - 12/02/15 1308    Visit Number 9   Number of Visits 17   Date for PT Re-Evaluation 12/27/15   Authorization Type Medicare/[employer] (2nd)-G-code every 10th visit   PT Start Time 0932   PT Stop Time 1015   PT Time Calculation (min) 43 min   Equipment Utilized During Treatment Gait belt   Activity Tolerance Patient tolerated treatment well   Behavior During Therapy [facility] for tasks assessed/performed      Past Medical History  Diagnosis Date  . Parkinson's disease ([place])   . Hypertension   . Kidney disease   . Tachycardia   . Depression   . Hypothyroidism   . Seasonal allergies     Past Surgical History  Procedure Laterality Date  . Thyroidectomy, partial    . Abdominal hysterectomy    . Cesarean section    . Tonsillectomy      Filed Vitals:   12/02/15 0900  SpO2: 92%    Visit Diagnosis:  Abnormality of gait  Unsteadiness      Subjective Assessment - 12/02/15 0934    Subjective Denies falls or changes since last visit.   Patient Stated Goals Pt wants helping moving and with balance.   Currently in Pain? No/denies                         [facility] Adult PT Treatment/Exercise - 12/02/15 0001    Transfers   Transfers Sit to Stand;Stand to Sit   Sit to Stand 6: Modified independent (Device/Increase time);Without upper extremity assist;From chair/3-in-1   Stand to Sit 6: Modified independent (Device/Increase time);Without upper extremity assist;To chair/3-in-1   Ambulation/Gait   Ambulation/Gait Yes   Ambulation/Gait Assistance 5: Supervision   Ambulation Distance (Feet) 600 Feet  plus and 120'x 2    Assistive device None   Gait Pattern Step-through pattern;Decreased arm swing - right;Decreased arm swing - left;Decreased step length - right;Decreased step length - left;Decreased trunk rotation;Poor foot clearance - left;Poor foot clearance - right;Narrow base of support   Ambulation Surface Level;Indoor   Gait Comments walking with head turns/nods and tossing/catching ball.  Several bouts of LOB but recovers without assist.           PWR [facility]) - 12/02/15 1018    PWR! exercises Moves in supine   PWR! Up 20   PWR! Rock 20   PWR! Twist 20   PWR! Step 20   Comments moderate verbal cues for technique;worked on Boost for breathing during PWR! Up          Balance Exercises - 12/02/15 1305    Balance Exercises: Standing   Standing Eyes Opened Head turns;Foam/compliant surface;Other reps (comment);Other (comment)  20 reps;marching, head turns/nods, sidestepping on foam   Rockerboard Anterior/posterior;Lateral;Head turns;Other reps (comment);Intermittent UE support  20 reps-head nods also           PT Education - 12/01/15 0935    Education provided Yes   Education Details Reviewed turning strategies as well as need for pacing, to slow down and to use head turns/visual targets to assist with better balance with turns   Person(s) Educated Patient  Methods Explanation;Demonstration;Handout;Verbal cues   Comprehension Verbalized understanding;Returned demonstration;Verbal cues required          PT Short Term Goals - 11/24/15 0945    PT SHORT TERM GOAL #1   Title Pt will be independent with HEP to address gait, balance, transfers.  TARGET 11/24/15   Status Achieved   PT SHORT TERM GOAL #2   Title Pt will improve 5x sit<>stand test to less than or equal to 12 seconds for improved transfer efficiency and safety.   Baseline 11.25 seconds 11/24/15   Status Achieved   PT SHORT TERM GOAL #3   Title Pt will improve TUG score to less than or equal to 13.5 seconds for decreased  fall risk.   Baseline Normal 8.97 sec, manual 9.81 sec, cognitive 9.68 sec on 11/24/15   Status Achieved   PT SHORT TERM GOAL #4   Title Pt will improve Functional Gait Assessment to at least 14/30 for decreased fall risk.   Baseline 15/30 on 11/24/15   Status Achieved   PT SHORT TERM GOAL #5   Title Pt will verbalize understanding of local Parkinson related resources.   Status Achieved           PT Long Term Goals - 11/24/15 0950    PT LONG TERM GOAL #1   Title Pt will verbalize understanding of fall prevention within the home environment.  TARGET 12/27/15   Status On-going   PT LONG TERM GOAL #2   Title Pt will improve gait velocity to at least 2.62 ft/sec for improved efficiency and safety with gait.   Status On-going   PT LONG TERM GOAL #3   Title Pt will improve TUG cognitive to less than or equal to 15 seconds for decreased fall risk.   Baseline 9.68 seconds 11/24/15   Status Achieved   PT LONG TERM GOAL #4   Title Pt will improve Functional GAit Assessment to at least 19/30 for decreased fall risk.   Status On-going   PT LONG TERM GOAL #5   Title Pt will verbalize plans for continued community fitness upon D/C from PT.   Status On-going               Plan - 12/02/15 1309    Clinical Impression Statement Pt continues with LOB at times with gait with dual tasking or distractibility.  Continues with DOE with activities yest SaO2 >90%.  Required several seated rest breaks today due to DOE.  Continue PT per POC.   Pt will benefit from skilled therapeutic intervention in order to improve on the following deficits Abnormal gait;Decreased activity tolerance;Decreased balance;Decreased mobility;Decreased endurance;Decreased strength;Difficulty walking;Impaired flexibility;Postural dysfunction   Rehab Potential Good   PT Frequency 2x / week   PT Duration 8 weeks  plus eval   PT Treatment/Interventions ADLs/Self Care Home Management;Therapeutic exercise;Therapeutic  activities;Functional mobility training;Gait training;Balance training;Neuromuscular re-education;Patient/family education   PT Next Visit Plan R continue to work on starts/stops, turns, change of direction and weigthshifting for improved balance, with added cognitive and dual tasking; compliant surfaces.   Consulted and Agree with Plan of Care Patient        Problem List Patient Active Problem List   Diagnosis Date Noted  . Parkinson's disease ([place]) 09/28/2015    Narda Bonds 12/02/2015, 1:12 PM  [facility] [address] [place], [place], [address] Phone: [phone]   Fax:  [phone]  Name: Anasophia Minero MRN: [id number] Date of Birth: [birth year]  Narda Bonds, [place] 12/02/2015 1:12 PM Phone: [phone] Fax: [phone]

## 2015-12-02 NOTE — Therapy (Signed)
[facility] [address] [place], [place], [address] Phone: [phone]   Fax:  [phone]  Occupational Therapy Treatment  Patient Details  Name: Ana Salazar MRN: [id number] Date of Birth: 1946/05/24 Referring Provider: Dr. Wells Guiles Tat  Encounter Date: 12/02/2015      OT End of Session - 12/02/15 0854    Visit Number 9   Number of Visits 17   Date for OT Re-Evaluation 12/24/15   Authorization Type Medicare, [employer], needs G-code   Authorization Time Period G    Authorization - Visit Number 9   Authorization - Number of Visits 10   OT Start Time 0849   OT Stop Time 0930   OT Time Calculation (min) 41 min   Activity Tolerance Patient tolerated treatment well   Behavior During Therapy [facility] for tasks assessed/performed      Past Medical History  Diagnosis Date  . Parkinson's disease ([place])   . Hypertension   . Kidney disease   . Tachycardia   . Depression   . Hypothyroidism   . Seasonal allergies     Past Surgical History  Procedure Laterality Date  . Thyroidectomy, partial    . Abdominal hysterectomy    . Cesarean section    . Tonsillectomy      There were no vitals filed for this visit.  Visit Diagnosis:  Rigidity  Bradykinesia  Decreased coordination  Decreased functional activity tolerance      Subjective Assessment - 12/01/15 0937    Subjective  "I'm doing fine"   Limitations PD diagnosis 2014; pt reports hx of multiple TIAs   Patient Stated Goals I want to be able to raise my arm more and move better.     Currently in Pain? No/denies           Fine motor coordination activities:PWR! Hands followed by picking up and stacking coins then counting out for bilateral UE's, min v.c. Dynamic functional step and reach with trunk rotation, close supervision -min guard for balance to copy small peg design with right UE then remove with bilateral UE's, min-mod v.c. Arm bike x 5  mins level 1 for conditioning, pt maintained 40 RPM Simulated donning shirt and pulling up socks with bag exercises, min v.c.                     OT Short Term Goals - 12/01/15 0939    OT SHORT TERM GOAL #1   Title Pt will be independent with PD-specific HEP.--check STGs 11/24/15   Time 4   Period Weeks   Status On-going  would benefit from reinforcement   OT SHORT TERM GOAL #2   Title Pt will verbalize understanding of ways to prevent future complications related to PD and appropriate community resources prn.   Time 4   Period Weeks   Status Achieved  met. 11/24/15   OT SHORT TERM GOAL #3   Title Pt will improve functional reaching/balance for ADLs/IADLs as shown by improving standing functional reach test by at least 2 inches bilaterally.   Baseline R-5.5", L-7"   Time 4   Period Weeks   Status Achieved  11/24/15:  R-13", L-13" will reassess next session for consistency; 11/26/15:  R-12", L-12"   OT SHORT TERM GOAL #4   Title Pt will improve coordination for ADLs as shown by improving time on 9-hole peg test by at least 5 sec with dominant RUE.   Baseline 38.97sec   Period  Weeks   Status Achieved  11/24/15:  25.72sec, but will reassess next session to ensure consistency; 11/26/15 19.72sec   OT SHORT TERM GOAL #5   Title Pt will report incr ease with peeling food and opening packages/containers.   Time 4   Period Weeks   Status Achieved  met 11/24/15   OT SHORT TERM GOAL #6   Title Pt will write at least 3 sentences with no significant decr in size and 100% legibility over at least 2 sessions.   Time 4   Period Weeks   Status Achieved  11/26/15           OT Long Term Goals - 11/26/15 0939    OT LONG TERM GOAL #1   Title Pt will verbalize understanding of AE/strategies to incr ease with ADLs/IADLs.--check LTGs 12/24/15   Time 8   Period Weeks   Status New   OT LONG TERM GOAL #2   Title Pt will improve bilateral hand coordination/functional reach as shown  by improving score on box and blocks test by at least 4 bilaterally.   Baseline R-37 blocks, L-39 blocks   Time 8   Period Weeks   Status New   OT LONG TERM GOAL #3   Title Pt will improve balance/functional reaching for ADLs/IADLs as shown by improving score on standing functional reach test by at least 3 inches with RUE.   Baseline 5.5"   Time 8   Period Weeks   Status Achieved  11/26/15:  12"   OT LONG TERM GOAL #4   Title Pt will improve coordination for ADLs as shown by improving time on 9-hole peg test by at least 10 sec with dominant RUE.   Time 8   Period Weeks   Status Achieved  11/26/15:  19.72sec   OT LONG TERM GOAL #5   Title Pt will be able to carry object in both hands with turns and backing up 1-2steps without LOB or spills to simulate kitchen tasks using big movement strategies.   Time 8   Period Weeks   Status New               Plan - 12/02/15 7939    Clinical Impression Statement Pt is progressing toards goals for balance and coordination.   Pt will benefit from skilled therapeutic intervention in order to improve on the following deficits (Retired) Decreased mobility;Impaired UE functional use;Decreased knowledge of use of DME;Decreased balance;Decreased activity tolerance;Impaired tone;Decreased range of motion;Decreased coordination   Rehab Potential Good   OT Frequency 2x / week   OT Duration 8 weeks   OT Treatment/Interventions Self-care/ADL training;Neuromuscular education;Therapeutic exercise;Functional Mobility Training;Patient/family education;Balance training;Splinting;Manual Therapy;Therapeutic exercises;Energy conservation;Ultrasound;Cryotherapy;DME and/or AE instruction;Therapeutic activities;Cognitive remediation/compensation;Passive range of motion;Electrical Stimulation;Moist Heat   Plan stategies for ADLS/Balance   OT Home Exercise Plan issued supine and seated PWR!, coordination HEP/PWR! hands (basic 4), PWR! hands, issued   Consulted and  Agree with Plan of Care Patient        Problem List Patient Active Problem List   Diagnosis Date Noted  . Parkinson's disease ([place]) 09/28/2015    RINE,KATHRYN 12/02/2015, 9:26 AM Theone Murdoch, OTR/L [phone] Phone: [phone] 9:26 AM 12/02/2015 [facility] [address] [place], [place], [address] Phone: [phone]   Fax:  [phone]  Name: Bali Lyn MRN: [id number] Date of Birth: 10-15-1945

## 2015-12-02 NOTE — Patient Instructions (Signed)

## 2015-12-08 ENCOUNTER — Ambulatory Visit: Payer: Medicare Other | Attending: Neurology | Admitting: Physical Therapy

## 2015-12-08 ENCOUNTER — Ambulatory Visit: Payer: Medicare Other | Admitting: Occupational Therapy

## 2015-12-08 DIAGNOSIS — R29898 Other symptoms and signs involving the musculoskeletal system: Secondary | ICD-10-CM

## 2015-12-08 DIAGNOSIS — R293 Abnormal posture: Secondary | ICD-10-CM

## 2015-12-08 DIAGNOSIS — R2681 Unsteadiness on feet: Secondary | ICD-10-CM

## 2015-12-08 DIAGNOSIS — R2689 Other abnormalities of gait and mobility: Secondary | ICD-10-CM | POA: Insufficient documentation

## 2015-12-08 DIAGNOSIS — R29818 Other symptoms and signs involving the nervous system: Secondary | ICD-10-CM | POA: Diagnosis present

## 2015-12-08 DIAGNOSIS — R269 Unspecified abnormalities of gait and mobility: Secondary | ICD-10-CM | POA: Insufficient documentation

## 2015-12-08 DIAGNOSIS — R278 Other lack of coordination: Secondary | ICD-10-CM | POA: Diagnosis present

## 2015-12-08 NOTE — Therapy (Signed)
[facility] [address] [place], [place], [address] Phone: [phone]   Fax:  [phone]  Occupational Therapy Treatment  Patient Details  Name: Ana Salazar MRN: [id number] Date of Birth: [birth year] Referring Provider: Dr. Wells Guiles Tat  Encounter Date: 12/08/2015      OT End of Session - 12/08/15 0949    Visit Number 10   Number of Visits 17   Date for OT Re-Evaluation 12/24/15   Authorization Type Medicare, [employer], needs G-code   Authorization Time Period G    Authorization - Visit Number 10   Authorization - Number of Visits 10   OT Start Time 0933   OT Stop Time 1015   OT Time Calculation (min) 42 min   Activity Tolerance Patient tolerated treatment well   Behavior During Therapy [facility] for tasks assessed/performed      Past Medical History  Diagnosis Date  . Parkinson's disease ([place])   . Hypertension   . Kidney disease   . Tachycardia   . Depression   . Hypothyroidism   . Seasonal allergies     Past Surgical History  Procedure Laterality Date  . Thyroidectomy, partial    . Abdominal hysterectomy    . Cesarean section    . Tonsillectomy      There were no vitals filed for this visit.  Visit Diagnosis:  Other lack of coordination  Other symptoms and signs involving the nervous system  Other symptoms and signs involving the musculoskeletal system  Abnormal posture      Subjective Assessment - 12/08/15 0948    Subjective  "I know I get fast"  "I do feel like I'm moving better, but there are times that I still have trouble."   Limitations PD diagnosis 2014; pt reports hx of multiple TIAs   Patient Stated Goals I want to be able to raise my arm more and move better.     Currently in Pain? Yes   Pain Score 4    Pain Location Back   Pain Orientation Right;Mid   Pain Descriptors / Indicators Sore   Pain Type Acute pain   Pain Onset In the past 7 days   Pain Frequency Intermittent    Aggravating Factors  twisting   Pain Relieving Factors heating pad                  Self Care:    Dressing:   Practiced buttoning/unbuttoning shirt on table top with min cues for use of PWR! Hands prior to buttoning, focus on timing of movement and use of deliberate/large amplitude movements after instruction.  Pt demo improvement with repetition and use of large amplitude movements.  Checked progress towards goals for g-code--see goals section below  Neuro Re-ed:  In standing, functional PWR! step and reach forward/back with each UE to place large pegs in vertical pegboard using PWR! Hands/reach with min cueing for large amplitude movements.    Placing/removing small pegs in pegboard to copy design for dual task/visual component with min cues for use of PWR! Hands with coordination.               OT Education - 12/08/15 1308    Education Details PWR! moves in modified quadraped (stepping with hands on table)   Person(s) Educated Patient   Methods Explanation;Demonstration;Verbal cues;Handout   Comprehension Verbalized understanding;Returned demonstration;Verbal cues required          OT Short Term Goals - 12/01/15 1093  OT SHORT TERM GOAL #1   Title Pt will be independent with PD-specific HEP.--check STGs 11/24/15   Time 4   Period Weeks   Status On-going  would benefit from reinforcement   OT SHORT TERM GOAL #2   Title Pt will verbalize understanding of ways to prevent future complications related to PD and appropriate community resources prn.   Time 4   Period Weeks   Status Achieved  met. 11/24/15   OT SHORT TERM GOAL #3   Title Pt will improve functional reaching/balance for ADLs/IADLs as shown by improving standing functional reach test by at least 2 inches bilaterally.   Baseline R-5.5", L-7"   Time 4   Period Weeks   Status Achieved  11/24/15:  R-13", L-13" will reassess next session for consistency; 11/26/15:  R-12", L-12"   OT SHORT TERM  GOAL #4   Title Pt will improve coordination for ADLs as shown by improving time on 9-hole peg test by at least 5 sec with dominant RUE.   Baseline 38.97sec   Period Weeks   Status Achieved  11/24/15:  25.72sec, but will reassess next session to ensure consistency; 11/26/15 19.72sec   OT SHORT TERM GOAL #5   Title Pt will report incr ease with peeling food and opening packages/containers.   Time 4   Period Weeks   Status Achieved  met 11/24/15   OT SHORT TERM GOAL #6   Title Pt will write at least 3 sentences with no significant decr in size and 100% legibility over at least 2 sessions.   Time 4   Period Weeks   Status Achieved  11/26/15           OT Long Term Goals - 12/08/15 1012    OT LONG TERM GOAL #1   Title Pt will verbalize understanding of AE/strategies to incr ease with ADLs/IADLs.--check LTGs 12/24/15   Time 8   Period Weeks   Status On-going   OT LONG TERM GOAL #2   Title Pt will improve bilateral hand coordination/functional reach as shown by improving score on box and blocks test by at least 4 bilaterally.   Baseline R-37 blocks, L-39 blocks   Time 8   Period Weeks   Status Achieved  12/08/15:  R-47blocks, L-53blocks   OT LONG TERM GOAL #3   Title Pt will improve balance/functional reaching for ADLs/IADLs as shown by improving score on standing functional reach test by at least 3 inches with RUE.   Baseline 5.5"   Time 8   Period Weeks   Status Achieved  11/26/15:  12"   OT LONG TERM GOAL #4   Title Pt will improve coordination for ADLs as shown by improving time on 9-hole peg test by at least 10 sec with dominant RUE.   Time 8   Period Weeks   Status Achieved  11/26/15:  19.72sec   OT LONG TERM GOAL #5   Title Pt will be able to carry object in both hands with turns and backing up 1-2steps without LOB or spills to simulate kitchen tasks using big movement strategies.   Time 8   Period Weeks   Status On-going               Plan - 12/08/15 0954     Clinical Impression Statement Pt has made excellent progress with coordination and balance for ADLs/functional reaching.  Pt would benefit from futher occupational therapy to reinforce strategies for ADLs and address timing of movement with coordination  to prevent  decr movement amplitude.   Pt will benefit from skilled therapeutic intervention in order to improve on the following deficits (Retired) Decreased mobility;Impaired UE functional use;Decreased knowledge of use of DME;Decreased balance;Decreased activity tolerance;Impaired tone;Decreased range of motion;Decreased coordination   OT Frequency 2x / week   OT Duration 8 weeks   OT Treatment/Interventions Self-care/ADL training;Neuromuscular education;Therapeutic exercise;Functional Mobility Training;Patient/family education;Balance training;Splinting;Manual Therapy;Therapeutic exercises;Energy conservation;Ultrasound;Cryotherapy;DME and/or AE instruction;Therapeutic activities;Cognitive remediation/compensation;Passive range of motion;Electrical Stimulation;Moist Heat   Plan continue with strategies for ADLs and balance; Review PWR! moves in modified quadraped   [place] issued supine and seated PWR!, coordination HEP/PWR! hands (basic 4), PWR! hands, issued; Jan 04, 2016 PWR! moves in modified quadraped (basic 4) with no PWR! up with step   Consulted and Agree with Plan of Care Patient          G-Codes - 2016-01-04 0951    Functional Assessment Tool Used Standing functional reach: R-12", L-12"; 9-hole peg test:  R-19.72sec, L-25.25sec; box and blocks test:  R-47, L-53 blocks   Functional Limitation Carrying, moving and handling objects   Carrying, Moving and Handling Objects Current Status [id number]) At least 1 percent but less than 20 percent impaired, limited or restricted   Carrying, Moving and Handling Objects Goal Status ([id number]) At least 1 percent but less than 20 percent impaired, limited or restricted      Occupational Therapy  Progress Note  Dates of Reporting Period: 10/27/15 to 01-04-2016  Objective Reports of Subjective Statement: see above  Objective Measurements: see above  Goal Update: see above  Plan: see above, continue through original POC  Reason Skilled Services are Required: see above    Problem List Patient Active Problem List   Diagnosis Date Noted  . Parkinson's disease ([place]) 09/28/2015    [facility] 01-04-16, 1:09 PM  [facility] [address] [place], [place], [address] Phone: [phone]   Fax:  [phone]  Name: Ana Salazar MRN: [id number] Date of Birth: 1946-09-01  Vianne Bulls, OTR/L [facility] [address] [place], [place]  [address] [phone] phone [phone] 2016/01/04 1:09 PM

## 2015-12-08 NOTE — Therapy (Addendum)
[facility] [address] [place], [place], [address] Phone: [phone]   Fax:  [phone]  Physical Therapy Treatment  Patient Details  Name: Elzina Devera MRN: [id number] Date of Birth: 01-29-1946 Referring Provider: Wells Guiles Tat  Encounter Date: 12/08/2015      PT End of Session - 12/08/15 0933    Visit Number 10   Number of Visits 17   Date for PT Re-Evaluation 12/27/15   Authorization Type Medicare/[employer] (2nd)-G-code every 10th visit   PT Start Time 0848   PT Stop Time 0928   PT Time Calculation (min) 40 min   Equipment Utilized During Treatment Gait belt   Activity Tolerance Patient tolerated treatment well   Behavior During Therapy [facility] for tasks assessed/performed      Past Medical History  Diagnosis Date  . Parkinson's disease ([place])   . Hypertension   . Kidney disease   . Tachycardia   . Depression   . Hypothyroidism   . Seasonal allergies     Past Surgical History  Procedure Laterality Date  . Thyroidectomy, partial    . Abdominal hysterectomy    . Cesarean section    . Tonsillectomy      There were no vitals filed for this visit.  Visit Diagnosis:  Abnormality of gait      Subjective Assessment - 12/08/15 0850    Subjective Denies falls or changes.  Having some back discomfort.  [facility]   Patient Stated Goals Pt wants helping moving and with balance.   Currently in Pain? Yes   Pain Score 4    Pain Location Back   Pain Orientation Mid;Right   Pain Descriptors / Indicators Sore   Pain Type Acute pain   Pain Onset In the past 7 days   Pain Frequency Intermittent   Aggravating Factors  turning the wrong way   Pain Relieving Factors heating pad     Balance activities in parallel bars-standing on foam with head turns/nods/ball toss, side stepping on foam, tandem walking on foam, tandem walking, tandem stance x 10 sec x 2, single limb stance x 10 sec x 2,  standing on both surfaces of BOSU.  Seated ankle dorsiflexion, eversion, inversion x 10 bil LE with red theraband.  Provided as HEP.        PT Education - 12/08/15 0932    Education provided Yes   Education Details HEP   Person(s) Educated Patient   Methods Explanation;Demonstration;Handout   Comprehension Verbalized understanding;Returned demonstration          PT Short Term Goals - 11/24/15 0945    PT SHORT TERM GOAL #1   Title Pt will be independent with HEP to address gait, balance, transfers.  TARGET 11/24/15   Status Achieved   PT SHORT TERM GOAL #2   Title Pt will improve 5x sit<>stand test to less than or equal to 12 seconds for improved transfer efficiency and safety.   Baseline 11.25 seconds 11/24/15   Status Achieved   PT SHORT TERM GOAL #3   Title Pt will improve TUG score to less than or equal to 13.5 seconds for decreased fall risk.   Baseline Normal 8.97 sec, manual 9.81 sec, cognitive 9.68 sec on 11/24/15   Status Achieved   PT SHORT TERM GOAL #4   Title Pt will improve Functional Gait Assessment to at least 14/30 for decreased fall risk.   Baseline 15/30 on 11/24/15   Status Achieved  PT SHORT TERM GOAL #5   Title Pt will verbalize understanding of local Parkinson related resources.   Status Achieved           PT Long Term Goals - 11/24/15 0950    PT LONG TERM GOAL #1   Title Pt will verbalize understanding of fall prevention within the home environment.  TARGET 12/27/15   Status On-going   PT LONG TERM GOAL #2   Title Pt will improve gait velocity to at least 2.62 ft/sec for improved efficiency and safety with gait.   Status On-going   PT LONG TERM GOAL #3   Title Pt will improve TUG cognitive to less than or equal to 15 seconds for decreased fall risk.   Baseline 9.68 seconds 11/24/15   Status Achieved   PT LONG TERM GOAL #4   Title Pt will improve Functional GAit Assessment to at least 19/30 for decreased fall risk.   Status On-going   PT LONG  TERM GOAL #5   Title Pt will verbalize plans for continued community fitness upon D/C from PT.   Status On-going               Plan - 2015-12-26 0934    Clinical Impression Statement Pt's DOE improved today.  Has ankle instability visible in balance activities on compliant surface.  Continue PT per POC.   Pt will benefit from skilled therapeutic intervention in order to improve on the following deficits Abnormal gait;Decreased activity tolerance;Decreased balance;Decreased mobility;Decreased endurance;Decreased strength;Difficulty walking;Impaired flexibility;Postural dysfunction   Rehab Potential Good   PT Frequency 2x / week   PT Duration 8 weeks  plus eval   PT Treatment/Interventions ADLs/Self Care Home Management;Therapeutic exercise;Therapeutic activities;Functional mobility training;Gait training;Balance training;Neuromuscular re-education;Patient/family education   PT Next Visit Plan Continue balance activities, gait with multitasking   Consulted and Agree with Plan of Care Patient          G-Codes - 12/26/2015 0936    Functional Assessment Tool Used 5x sit<>stand 11.25 sec,TUG 8.97 sec, TUG manual 9.81 sec, TUG cognitive 9.68 sec, FGA 15/30      Problem List Patient Active Problem List   Diagnosis Date Noted  . Parkinson's disease ([place]) 09/28/2015    Narda Bonds 2015-12-26, 9:38 AM  [facility] [address] [place], [place], [address] Phone: [phone]   Fax:  [phone]  Name: Reve Crocket MRN: [id number] Date of Birth: November 09, 1945    Einar Grad Roscoe Dec 26, 2015 9:38 AM Phone: [phone] Fax: [phone]  Addendum: G Codes Functional Assessment Tool: 5x sit<>stand 11.25 sec, TUG 8.97 sec, TUG manual 9.81 sec, TUG cog 9.68 sec, FGA 15/30 Functional Limitation:  Mobility: Walking and Moving Around Current Status  [phone]):  CJ Goal Status  (D1761): CJ  Physical Therapy Progress Note  Dates of Reporting Period: 10/28/15 to Dec 26, 2015  Objective Reports of Subjective Statement: No recent reported falls.  Objective Measurements: 5x sit<>stand 11.25 sec, TUG 8.97 sec, FGA 15/30  Goal Update: Pt has met all STGs and is on target towards LTGs.  Plan: Continue skilled physical therapy towards LTGs to address posture, balance, gait  Reason Skilled Services are Required: Pt is progressing with balance measures, but continues to be at fall risk with dynamic balance and gait.  Pt has improved speed with some functional measures, but needs cues for grading/pacing movement patterns for optimal safety.  Mady Haagensen, PT 12/09/2015 9:50 AM Phone: [phone] Fax: [phone]

## 2015-12-08 NOTE — Patient Instructions (Addendum)
Oculomotor: Saccades    Holding two targets positioned side by side 12 inches apart, move eyes quickly from target to target as head stays still. Move 10 seconds each direction. Perform sitting. Repeat 2 times per session. Do 3 sessions per day.  Copyright  VHI. All rights reserved.    ANKLE: Dorsiflexion (Band)    Sit at edge of surface. Place band around top of foot. Keeping heel on floor, raise toes of banded foot. Hold 2 seconds. Use red band. Repeat 10 times on each leg twice a day.  Copyright  VHI. All rights reserved.   ANKLE: Eversion, Unilateral (Band)    Place band around left foot. Keeping heel in place, raise toes of banded foot up and away from body. Do not move hip. Hold 2 seconds. Use red band. Repeat 10 times on each leg twice a day.  Copyright  VHI. All rights reserved.   ANKLE: Inversion, Unilateral (Band)    Placing band around left foot.   Can have caregiver hold band or you can hold it.  Keeping heel in place, lift toes of banded foot up and in. Do not move hip. Hold 2 seconds. Use red band. Repeat 10 times on each leg twice a day.  Copyright  VHI. All rights reserved.

## 2015-12-10 ENCOUNTER — Ambulatory Visit: Payer: Medicare Other | Admitting: Physical Therapy

## 2015-12-10 ENCOUNTER — Ambulatory Visit: Payer: Medicare Other | Admitting: Occupational Therapy

## 2015-12-10 VITALS — BP 160/83

## 2015-12-10 DIAGNOSIS — R293 Abnormal posture: Secondary | ICD-10-CM

## 2015-12-10 DIAGNOSIS — R29898 Other symptoms and signs involving the musculoskeletal system: Secondary | ICD-10-CM

## 2015-12-10 DIAGNOSIS — R278 Other lack of coordination: Secondary | ICD-10-CM

## 2015-12-10 DIAGNOSIS — R2681 Unsteadiness on feet: Secondary | ICD-10-CM

## 2015-12-10 DIAGNOSIS — R29818 Other symptoms and signs involving the nervous system: Secondary | ICD-10-CM

## 2015-12-10 DIAGNOSIS — R269 Unspecified abnormalities of gait and mobility: Secondary | ICD-10-CM | POA: Diagnosis not present

## 2015-12-10 DIAGNOSIS — R2689 Other abnormalities of gait and mobility: Secondary | ICD-10-CM

## 2015-12-10 NOTE — Therapy (Signed)
[facility] [address] [place], [place], [address] Phone: [phone]   Fax:  [phone]  Occupational Therapy Treatment  Patient Details  Name: Ana Salazar MRN: [id number] Date of Birth: 04/04/1946 Referring Provider: Dr. Wells Guiles Tat  Encounter Date: 12/10/2015      OT End of Session - 12/10/15 0939    Visit Number 11   Number of Visits 17   Date for OT Re-Evaluation 12/24/15   Authorization Type Medicare, [employer], needs G-code   Authorization Time Period G    Authorization - Visit Number 11   Authorization - Number of Visits 20   OT Start Time [phone]   OT Stop Time 1015   OT Time Calculation (min) 41 min   Activity Tolerance Patient tolerated treatment well   Behavior During Therapy [facility] for tasks assessed/performed      Past Medical History  Diagnosis Date  . Parkinson's disease ([place])   . Hypertension   . Kidney disease   . Tachycardia   . Depression   . Hypothyroidism   . Seasonal allergies     Past Surgical History  Procedure Laterality Date  . Thyroidectomy, partial    . Abdominal hysterectomy    . Cesarean section    . Tonsillectomy      There were no vitals filed for this visit.  Visit Diagnosis:  Other lack of coordination  Other symptoms and signs involving the nervous system  Other symptoms and signs involving the musculoskeletal system  Abnormal posture  Unsteadiness      Subjective Assessment - 12/10/15 0937    Subjective  "I messed up on my pills earlier so I may be slower" --supposed to take them at 5:00 and 8:00, but didn't take them to 7:00   Limitations PD diagnosis 2014; pt reports hx of multiple TIAs   Patient Stated Goals I want to be able to raise my arm more and move better.     Currently in Pain? No/denies                      OT Treatments/Exercises (OP) - 12/10/15 0001    Fine Motor Coordination   Fine Motor Coordination Grooved pegs   Grooved pegs with min difficulty with in-hand manipulation and cues to use PWR! hands.with each hand        Neuro re-ed:  PWR! Moves (basic 4) in modified quadraped x 10 each with min cues For incr movement amplitude, controlled movement, timing.   Self Care:    Dressing:   Simulated ADLs with bag with focus/min cues for large amplitude movements:  Donning/doffing pull-over shirt, donning/doffing pants, pulling bag into hand for clothing adjustment/donning socks.  Functional mobility:   Ambulating to practice direction changes (after instruction/review of strategy) while carrying cup of water and plate with loose objects with no LOB but bumped object on R side x1 and got too close to object on R side 1 additional time.  Pt cued to slow down and look ahead.    In standing, functional step and reach forward/back to place large pegs in vertical pegboard using PWR! Hands/reach with min cueing for large amplitude step/hands.  Then reaching from floor>overhead while holding to counter and keeping feet apart for better balance (after min cues/instruction for safety strategies with bending/retrieving objects from the floor.                 OT Short Term Goals - 12/01/15 2233  OT SHORT TERM GOAL #1   Title Pt will be independent with PD-specific HEP.--check STGs 11/24/15   Time 4   Period Weeks   Status On-going  would benefit from reinforcement   OT SHORT TERM GOAL #2   Title Pt will verbalize understanding of ways to prevent future complications related to PD and appropriate community resources prn.   Time 4   Period Weeks   Status Achieved  met. 11/24/15   OT SHORT TERM GOAL #3   Title Pt will improve functional reaching/balance for ADLs/IADLs as shown by improving standing functional reach test by at least 2 inches bilaterally.   Baseline R-5.5", L-7"   Time 4   Period Weeks   Status Achieved  11/24/15:  R-13", L-13" will reassess next session for consistency; 11/26/15:   R-12", L-12"   OT SHORT TERM GOAL #4   Title Pt will improve coordination for ADLs as shown by improving time on 9-hole peg test by at least 5 sec with dominant RUE.   Baseline 38.97sec   Period Weeks   Status Achieved  11/24/15:  25.72sec, but will reassess next session to ensure consistency; 11/26/15 19.72sec   OT SHORT TERM GOAL #5   Title Pt will report incr ease with peeling food and opening packages/containers.   Time 4   Period Weeks   Status Achieved  met 11/24/15   OT SHORT TERM GOAL #6   Title Pt will write at least 3 sentences with no significant decr in size and 100% legibility over at least 2 sessions.   Time 4   Period Weeks   Status Achieved  11/26/15           OT Long Term Goals - 12/08/15 1012    OT LONG TERM GOAL #1   Title Pt will verbalize understanding of AE/strategies to incr ease with ADLs/IADLs.--check LTGs 12/24/15   Time 8   Period Weeks   Status On-going   OT LONG TERM GOAL #2   Title Pt will improve bilateral hand coordination/functional reach as shown by improving score on box and blocks test by at least 4 bilaterally.   Baseline R-37 blocks, L-39 blocks   Time 8   Period Weeks   Status Achieved  12/08/15:  R-47blocks, L-53blocks   OT LONG TERM GOAL #3   Title Pt will improve balance/functional reaching for ADLs/IADLs as shown by improving score on standing functional reach test by at least 3 inches with RUE.   Baseline 5.5"   Time 8   Period Weeks   Status Achieved  11/26/15:  12"   OT LONG TERM GOAL #4   Title Pt will improve coordination for ADLs as shown by improving time on 9-hole peg test by at least 10 sec with dominant RUE.   Time 8   Period Weeks   Status Achieved  11/26/15:  19.72sec   OT LONG TERM GOAL #5   Title Pt will be able to carry object in both hands with turns and backing up 1-2steps without LOB or spills to simulate kitchen tasks using big movement strategies.   Time 8   Period Weeks   Status On-going                Plan - 12/10/15 0939    Clinical Impression Statement Pt continues to progress towards goals for ADLs and balance.   Plan continue with strategies for ADLs and balance; check goals with anticipated d/c next week   OT Home Exercise  Plan issued supine and seated PWR!, coordination HEP/PWR! hands (basic 4), PWR! hands, issued; 12/08/15 PWR! moves in modified quadraped (basic 4) with no PWR! up with step   Consulted and Agree with Plan of Care Patient        Problem List Patient Active Problem List   Diagnosis Date Noted  . Parkinson's disease ([place]) 09/28/2015    [facility] 12/10/2015, 9:57 AM  [facility] [address] [place], [place], [address] Phone: [phone]   Fax:  [phone]  Name: Ana Salazar MRN: [id number] Date of Birth: 1946/04/06  Vianne Bulls, OTR/L [facility] [address] [place], [place]  [address] [phone] phone [phone] 12/10/2015 9:57 AM

## 2015-12-10 NOTE — Patient Instructions (Signed)

## 2015-12-11 NOTE — Therapy (Signed)
[facility] [address] [place], [place], [address] Phone: [phone]   Fax:  [phone]  Physical Therapy Treatment  Patient Details  Name: Ana Salazar MRN: [id number] Date of Birth: 1946/02/17 Referring Provider: Wells Guiles Tat  Encounter Date: 12/10/2015      PT End of Session - 12/11/15 1640    Visit Number 11   Number of Visits 17   Date for PT Re-Evaluation 12/27/15   Authorization Type Medicare/[employer] (2nd)-G-code every 10th visit   PT Start Time 0848   PT Stop Time 0930   PT Time Calculation (min) 42 min   Equipment Utilized During Treatment Gait belt   Activity Tolerance Patient tolerated treatment well   Behavior During Therapy [facility] for tasks assessed/performed      Past Medical History  Diagnosis Date  . Parkinson's disease ([place])   . Hypertension   . Kidney disease   . Tachycardia   . Depression   . Hypothyroidism   . Seasonal allergies     Past Surgical History  Procedure Laterality Date  . Thyroidectomy, partial    . Abdominal hysterectomy    . Cesarean section    . Tonsillectomy      Filed Vitals:   12/10/15 0851  BP: 160/83        Subjective Assessment - 12/11/15 1640    Subjective Sometimes have headaches at night-not every night.  Back is better.   Patient Stated Goals Pt wants helping moving and with balance.   Currently in Pain? No/denies          Neuro Re-education:   Balance activities in parallel bars-standing on foam with head turns/nods/ball toss, side stepping on foam, forward stepping, backward stepping on foam x 10 reps each, then marching on foam, forward kicks on foam x 10 reps each with intermittent UE support.  On foam and on rockerboard, head turns x 5, head nods x 5 then eyes closed with min guard assistance.  On rockerboard:  Head turn to look at cards, then ball circles.  Also on rockerboard, ant/post weigthshifting for hip/ankle  strategy.  Therapeutic Ex: Review of HEP given last visit:  seated ankle dorsiflexion, eversion, inversion x 10 bil LE with red theraband. PT assists with placement of band and provides cues for slowed technique/pacing for exercise.           Self care: Checked vitals due to pt's c/o headaches recently.  BP today 160/83.  Advised pt to talk with physician if headaches continue.  Discussed signs/symptoms of HTN.  Provided fall prevention education information and reviewed importance of good posture, slowed pacing with gait and use of visual targets to improve balance.           PT Education - 12/11/15 1648    Education provided Yes   Education Details Checked BP due to pt's c/o headache-s/s of HTN, fall prevention   Person(s) Educated Patient   Methods Explanation;Handout   Comprehension Verbalized understanding          PT Short Term Goals - 11/24/15 0945    PT SHORT TERM GOAL #1   Title Pt will be independent with HEP to address gait, balance, transfers.  TARGET 11/24/15   Status Achieved   PT SHORT TERM GOAL #2   Title Pt will improve 5x sit<>stand test to less than or equal to 12 seconds for improved transfer efficiency and safety.   Baseline 11.25 seconds 11/24/15   Status Achieved  PT SHORT TERM GOAL #3   Title Pt will improve TUG score to less than or equal to 13.5 seconds for decreased fall risk.   Baseline Normal 8.97 sec, manual 9.81 sec, cognitive 9.68 sec on 11/24/15   Status Achieved   PT SHORT TERM GOAL #4   Title Pt will improve Functional Gait Assessment to at least 14/30 for decreased fall risk.   Baseline 15/30 on 11/24/15   Status Achieved   PT SHORT TERM GOAL #5   Title Pt will verbalize understanding of local Parkinson related resources.   Status Achieved           PT Long Term Goals - 11/24/15 0950    PT LONG TERM GOAL #1   Title Pt will verbalize understanding of fall prevention within the home environment.  TARGET 12/27/15   Status  On-going   PT LONG TERM GOAL #2   Title Pt will improve gait velocity to at least 2.62 ft/sec for improved efficiency and safety with gait.   Status On-going   PT LONG TERM GOAL #3   Title Pt will improve TUG cognitive to less than or equal to 15 seconds for decreased fall risk.   Baseline 9.68 seconds 11/24/15   Status Achieved   PT LONG TERM GOAL #4   Title Pt will improve Functional GAit Assessment to at least 19/30 for decreased fall risk.   Status On-going   PT LONG TERM GOAL #5   Title Pt will verbalize plans for continued community fitness upon D/C from PT.   Status On-going               Plan - 12/11/15 1641    Clinical Impression Statement Pt return demonstrates understanding of HEP provided last visit.  Provided fall prevention education today.  Pt appears to have improved control with balance activities in parallel bars today.   Rehab Potential Good   PT Frequency 2x / week   PT Duration 8 weeks  plus eval   PT Treatment/Interventions ADLs/Self Care Home Management;Therapeutic exercise;Therapeutic activities;Functional mobility training;Gait training;Balance training;Neuromuscular re-education;Patient/family education   PT Next Visit Plan Continue gait and balance activities, incorporating head turns.  May begin checking LTGs, as pt has last scheduled appts next week.   Consulted and Agree with Plan of Care Patient      Patient will benefit from skilled therapeutic intervention in order to improve the following deficits and impairments:  Abnormal gait, Decreased activity tolerance, Decreased balance, Decreased mobility, Decreased endurance, Decreased strength, Difficulty walking, Impaired flexibility, Postural dysfunction  Visit Diagnosis: Abnormal posture  Other abnormalities of gait and mobility     Problem List Patient Active Problem List   Diagnosis Date Noted  . Parkinson's disease ([place]) 09/28/2015    MARRIOTT,AMY W. 12/11/2015, 4:50 PM  Frazier Butt., PT  [facility] [address] [place], [place], [address] Phone: [phone]   Fax:  [phone]  Name: Merisha Horgen MRN: [id number] Date of Birth: Oct 28, 1945

## 2015-12-14 ENCOUNTER — Telehealth: Payer: Self-pay | Admitting: Neurology

## 2015-12-14 NOTE — Telephone Encounter (Signed)
Ana Salazar 12-31-2045. Her daughter called with some questions regarding her mother's visit next Tuesday to Neuro Psych. Her call back number is [phone]. Thank you

## 2015-12-14 NOTE — Telephone Encounter (Signed)
Left message on machine for patient's daughter to call back.   

## 2015-12-14 NOTE — Telephone Encounter (Signed)
Patient's daughter called and left a voicemail stating that she had questions about neuro psych testing. Called back and left voicemail for her to call if she needs me, but if she has specific neuro psych questions about testing she could call [facility] directly.

## 2015-12-15 ENCOUNTER — Ambulatory Visit: Payer: Medicare Other | Admitting: Physical Therapy

## 2015-12-15 ENCOUNTER — Ambulatory Visit: Payer: Medicare Other | Admitting: Occupational Therapy

## 2015-12-15 VITALS — BP 151/77 | HR 60

## 2015-12-15 DIAGNOSIS — R2681 Unsteadiness on feet: Secondary | ICD-10-CM

## 2015-12-15 DIAGNOSIS — R29818 Other symptoms and signs involving the nervous system: Secondary | ICD-10-CM

## 2015-12-15 DIAGNOSIS — R278 Other lack of coordination: Secondary | ICD-10-CM

## 2015-12-15 DIAGNOSIS — R293 Abnormal posture: Secondary | ICD-10-CM

## 2015-12-15 DIAGNOSIS — R29898 Other symptoms and signs involving the musculoskeletal system: Secondary | ICD-10-CM

## 2015-12-15 DIAGNOSIS — R269 Unspecified abnormalities of gait and mobility: Secondary | ICD-10-CM | POA: Diagnosis not present

## 2015-12-15 NOTE — Therapy (Signed)
[place] [address] [place], [place], [address] Phone: [phone]   Fax:  [phone]  Physical Therapy Treatment  Patient Details  Name: Ana Salazar MRN: [id number] Date of Birth: May 08, 1946 Referring Provider: Wells Guiles Tat  Encounter Date: 12/15/2015      PT End of Session - 12/15/15 1317    Visit Number 12   Number of Visits 17   Date for PT Re-Evaluation 12/27/15   Authorization Type Medicare/[employer] (2nd)-G-code every 10th visit   PT Start Time 0848   PT Stop Time 0930   PT Time Calculation (min) 42 min   Equipment Utilized During Treatment Gait belt   Activity Tolerance Patient tolerated treatment well   Behavior During Therapy [facility] for tasks assessed/performed      Past Medical History  Diagnosis Date  . Parkinson's disease ([place])   . Hypertension   . Kidney disease   . Tachycardia   . Depression   . Hypothyroidism   . Seasonal allergies     Past Surgical History  Procedure Laterality Date  . Thyroidectomy, partial    . Abdominal hysterectomy    . Cesarean section    . Tonsillectomy      Filed Vitals:   12/15/15 0700  BP: 151/77  Pulse: 60        Subjective Assessment - 12/15/15 0846    Subjective Denies falls or changes.  No back pain or headache.   Patient Stated Goals Pt wants helping moving and with balance.   Currently in Pain? No/denies          PWR [facility]) - 12/15/15 1316    PWR! Up 20   PWR! Rock 20   PWR! Twist 20   PWR! Step 20   Comments cues for intensity          Balance Exercises - 12/15/15 1312    Balance Exercises: Standing   Standing Eyes Opened Head turns;Foam/compliant surface;Other reps (comment)  20-30 reps on blue foam beam   Rockerboard Anterior/posterior;Lateral;Head turns;Other reps (comment)  20-30 reps   Step Ups Forward;6 inch;UE support 1   Gait with Head Turns Forward;Other (comment)  300' plus while turning head to look at  cards   Other Standing Exercises both surfaces of bosu with head turns/nods x 20-30 reps with intermittent UE support;Taps of 6", 12",6" and floor alternating LE's x 20 with intermittent UE support           PT Education - 12/15/15 1316    Education provided Yes   Education Details Intensity with PWR! sitting   Person(s) Educated Patient   Methods Explanation;Demonstration   Comprehension Verbalized understanding          PT Short Term Goals - 11/24/15 0945    PT SHORT TERM GOAL #1   Title Pt will be independent with HEP to address gait, balance, transfers.  TARGET 11/24/15   Status Achieved   PT SHORT TERM GOAL #2   Title Pt will improve 5x sit<>stand test to less than or equal to 12 seconds for improved transfer efficiency and safety.   Baseline 11.25 seconds 11/24/15   Status Achieved   PT SHORT TERM GOAL #3   Title Pt will improve TUG score to less than or equal to 13.5 seconds for decreased fall risk.   Baseline Normal 8.97 sec, manual 9.81 sec, cognitive 9.68 sec on 11/24/15   Status Achieved   PT SHORT TERM GOAL #4   Title Pt will  improve Functional Gait Assessment to at least 14/30 for decreased fall risk.   Baseline 15/30 on 11/24/15   Status Achieved   PT SHORT TERM GOAL #5   Title Pt will verbalize understanding of local Parkinson related resources.   Status Achieved           PT Long Term Goals - 11/24/15 0950    PT LONG TERM GOAL #1   Title Pt will verbalize understanding of fall prevention within the home environment.  TARGET 12/27/15   Status On-going   PT LONG TERM GOAL #2   Title Pt will improve gait velocity to at least 2.62 ft/sec for improved efficiency and safety with gait.   Status On-going   PT LONG TERM GOAL #3   Title Pt will improve TUG cognitive to less than or equal to 15 seconds for decreased fall risk.   Baseline 9.68 seconds 11/24/15   Status Achieved   PT LONG TERM GOAL #4   Title Pt will improve Functional GAit Assessment to at least  19/30 for decreased fall risk.   Status On-going   PT LONG TERM GOAL #5   Title Pt will verbalize plans for continued community fitness upon D/C from PT.   Status On-going               Plan - 12/15/15 1317    Clinical Impression Statement Pt has progressed well with balance activites and gait.  Continue PT per POC with discharge next visit per Mady Haagensen, PT.   Rehab Potential Good   PT Frequency 2x / week   PT Duration 8 weeks  plus eval   PT Treatment/Interventions ADLs/Self Care Home Management;Therapeutic exercise;Therapeutic activities;Functional mobility training;Gait training;Balance training;Neuromuscular re-education;Patient/family education   PT Next Visit Plan Check goals, discharge and gcode.   Consulted and Agree with Plan of Care Patient      Patient will benefit from skilled therapeutic intervention in order to improve the following deficits and impairments:  Abnormal gait, Decreased activity tolerance, Decreased balance, Decreased mobility, Decreased endurance, Decreased strength, Difficulty walking, Impaired flexibility, Postural dysfunction  Visit Diagnosis: Unsteadiness  Other symptoms and signs involving the nervous system     Problem List Patient Active Problem List   Diagnosis Date Noted  . Parkinson's disease ([place]) 09/28/2015    Narda Bonds 12/15/2015, 1:20 PM  [place] [address] [place], [place], [address] Phone: [phone]   Fax:  [phone]  Name: Ana Salazar MRN: [id number] Date of Birth: 01-12-1946    Narda Bonds, Rossie 12/15/2015 1:20 PM Phone: [phone] Fax: [phone]

## 2015-12-15 NOTE — Therapy (Signed)
[place] [address] [place], [place], [address] Phone: [phone]   Fax:  [phone]  Occupational Therapy Treatment  Patient Details  Name: Ana Salazar MRN: [id number] Date of Birth: [birth year] Referring Provider: Dr. Wells Guiles Tat  Encounter Date: 12/15/2015      OT End of Session - 12/15/15 0940    Visit Number 12   Number of Visits 17   Date for OT Re-Evaluation 12/24/15   Authorization Type Medicare, [employer], needs G-code   Authorization Time Period G    Authorization - Visit Number 12   Authorization - Number of Visits 20   OT Start Time 0935   OT Stop Time 1015   OT Time Calculation (min) 40 min   Activity Tolerance Patient tolerated treatment well   Behavior During Therapy [facility] for tasks assessed/performed      Past Medical History  Diagnosis Date  . Parkinson's disease ([place])   . Hypertension   . Kidney disease   . Tachycardia   . Depression   . Hypothyroidism   . Seasonal allergies     Past Surgical History  Procedure Laterality Date  . Thyroidectomy, partial    . Abdominal hysterectomy    . Cesarean section    . Tonsillectomy      There were no vitals filed for this visit.      Subjective Assessment - 12/15/15 0938    Subjective  "I'm ok"   Limitations PD diagnosis 2014; pt reports hx of multiple TIAs   Patient Stated Goals I want to be able to raise my arm more and move better.     Currently in Pain? No/denies        Neuro re-ed:  PWR! Moves (basic 4) in supine x 10 each with min cues For incr movement amplitude.  Arm bike x55mn level 1 for reciprocal movement with cues/target of at least 40rpms for intensity while maintaining movement amplitude/reciprocal movement.   Pt maintained 40-45rpms. (forward and backward).   Neuro Re-ed:  In standing, functional reaching in diagonal pattern incorporating trunk rotation/wt. shift and PWR! reach to manipulate and place  clothespins on vertical pole with min cueing (floor to overhead) with each UE with no LOB.   PWR! Multi-directional Movements:  (all improved with repetition)  Stepping to given sequence with min difficulty/cues for dual task/cognitive component.   Then multi-directional reaching to given sequence with min difficulty/cues for dual task/cognitive component.   Followed by multi-directional reaching with stepping with min difficulty/cues for dual task/cognitive component.     Then repeated reaching and stepping while calling out given colors with min-mod difficulties/cues for dual task/additional cognitive component.      Self Care:    Functional mobility: Ambulating to practice direction changes (including backwards) while carrying "tray" with loose objects with BUEs with no LOB.                           OT Short Term Goals - 12/01/15 0939    OT SHORT TERM GOAL #1   Title Pt will be independent with PD-specific HEP.--check STGs 11/24/15   Time 4   Period Weeks   Status On-going  would benefit from reinforcement   OT SHORT TERM GOAL #2   Title Pt will verbalize understanding of ways to prevent future complications related to PD and appropriate community resources prn.   Time 4   Period Weeks   Status Achieved  met. 11/24/15   OT SHORT TERM GOAL #3   Title Pt will improve functional reaching/balance for ADLs/IADLs as shown by improving standing functional reach test by at least 2 inches bilaterally.   Baseline R-5.5", L-7"   Time 4   Period Weeks   Status Achieved  11/24/15:  R-13", L-13" will reassess next session for consistency; 11/26/15:  R-12", L-12"   OT SHORT TERM GOAL #4   Title Pt will improve coordination for ADLs as shown by improving time on 9-hole peg test by at least 5 sec with dominant RUE.   Baseline 38.97sec   Period Weeks   Status Achieved  11/24/15:  25.72sec, but will reassess next session to ensure consistency; 11/26/15 19.72sec   OT  SHORT TERM GOAL #5   Title Pt will report incr ease with peeling food and opening packages/containers.   Time 4   Period Weeks   Status Achieved  met 11/24/15   OT SHORT TERM GOAL #6   Title Pt will write at least 3 sentences with no significant decr in size and 100% legibility over at least 2 sessions.   Time 4   Period Weeks   Status Achieved  11/26/15           OT Long Term Goals - 12/08/15 1012    OT LONG TERM GOAL #1   Title Pt will verbalize understanding of AE/strategies to incr ease with ADLs/IADLs.--check LTGs 12/24/15   Time 8   Period Weeks   Status On-going   OT LONG TERM GOAL #2   Title Pt will improve bilateral hand coordination/functional reach as shown by improving score on box and blocks test by at least 4 bilaterally.   Baseline R-37 blocks, L-39 blocks   Time 8   Period Weeks   Status Achieved  12/08/15:  R-47blocks, L-53blocks   OT LONG TERM GOAL #3   Title Pt will improve balance/functional reaching for ADLs/IADLs as shown by improving score on standing functional reach test by at least 3 inches with RUE.   Baseline 5.5"   Time 8   Period Weeks   Status Achieved  11/26/15:  12"   OT LONG TERM GOAL #4   Title Pt will improve coordination for ADLs as shown by improving time on 9-hole peg test by at least 10 sec with dominant RUE.   Time 8   Period Weeks   Status Achieved  11/26/15:  19.72sec   OT LONG TERM GOAL #5   Title Pt will be able to carry object in both hands with turns and backing up 1-2steps without LOB or spills to simulate kitchen tasks using big movement strategies.   Time 8   Period Weeks   Status On-going               Plan - 12/15/15 1610    Clinical Impression Statement Pt is progressing toward remaining goals for ADLs and balance.   Plan check goals and d/c next visit; schedule follow-up   OT Home Exercise Plan issued supine and seated PWR!, coordination HEP/PWR! hands (basic 4), PWR! hands, issued; 12/08/15 PWR! moves in  modified quadraped (basic 4) with no PWR! up with step   Consulted and Agree with Plan of Care Patient      Patient will benefit from skilled therapeutic intervention in order to improve the following deficits and impairments:     Visit Diagnosis: Other symptoms and signs involving the nervous system  Other lack of coordination  Other symptoms  and signs involving the musculoskeletal system  Abnormal posture  Unsteadiness    Problem List Patient Active Problem List   Diagnosis Date Noted  . Parkinson's disease ([place]) 09/28/2015    [facility] 12/15/2015, 9:51 AM  [place] [address] [place], [place], [address] Phone: [phone]   Fax:  [phone]  Name: Ana Salazar MRN: [id number] Date of Birth: 1946-07-02  Vianne Bulls, OTR/L [facility] [address] [place], [place]  [address] [phone] phone [phone] 12/15/2015 9:52 AM

## 2015-12-17 ENCOUNTER — Telehealth: Payer: Self-pay | Admitting: Neurology

## 2015-12-17 ENCOUNTER — Ambulatory Visit: Payer: Medicare Other | Admitting: Occupational Therapy

## 2015-12-17 ENCOUNTER — Ambulatory Visit: Payer: Medicare Other | Admitting: Physical Therapy

## 2015-12-17 DIAGNOSIS — R278 Other lack of coordination: Secondary | ICD-10-CM

## 2015-12-17 DIAGNOSIS — R29898 Other symptoms and signs involving the musculoskeletal system: Secondary | ICD-10-CM

## 2015-12-17 DIAGNOSIS — R29818 Other symptoms and signs involving the nervous system: Secondary | ICD-10-CM

## 2015-12-17 DIAGNOSIS — R2689 Other abnormalities of gait and mobility: Secondary | ICD-10-CM

## 2015-12-17 DIAGNOSIS — R2681 Unsteadiness on feet: Secondary | ICD-10-CM

## 2015-12-17 DIAGNOSIS — R293 Abnormal posture: Secondary | ICD-10-CM

## 2015-12-17 DIAGNOSIS — R269 Unspecified abnormalities of gait and mobility: Secondary | ICD-10-CM | POA: Diagnosis not present

## 2015-12-17 NOTE — Therapy (Addendum)
[facility] [address] [place], [place], [address] Phone: [phone]   Fax:  [phone]  Physical Therapy Treatment  Patient Details  Name: Ana Salazar MRN: [id number] Date of Birth: August 03, 1946 Referring Provider: Wells Guiles Tat  Encounter Date: 12/17/2015      PT End of Session - 12/17/15 0930    Visit Number 13   Number of Visits 17   Date for PT Re-Evaluation 12/27/15   Authorization Type Medicare/[employer] (2nd)-G-code every 10th visit   PT Start Time 0851   PT Stop Time 0924  Discharge day   PT Time Calculation (min) 33 min   Activity Tolerance Patient tolerated treatment well   Behavior During Therapy [facility] for tasks assessed/performed      Past Medical History  Diagnosis Date  . Parkinson's disease ([place])   . Hypertension   . Kidney disease   . Tachycardia   . Depression   . Hypothyroidism   . Seasonal allergies     Past Surgical History  Procedure Laterality Date  . Thyroidectomy, partial    . Abdominal hysterectomy    . Cesarean section    . Tonsillectomy      There were no vitals filed for this visit.      Subjective Assessment - 12/17/15 0853    Subjective "[facility]   Patient Stated Goals Pt wants helping moving and with balance.   Currently in Pain? No/denies            [facility] PT Assessment - 12/17/15 0855    Functional Gait  Assessment   Gait assessed  Yes   Gait Level Surface Walks 20 ft in less than 5.5 sec, no assistive devices, good speed, no evidence for imbalance, normal gait pattern, deviates no more than 6 in outside of the 12 in walkway width.   Change in Gait Speed Able to change speed, demonstrates mild gait deviations, deviates 6-10 in outside of the 12 in walkway width, or no gait deviations, unable to achieve a major change in velocity, or uses a change in velocity, or uses an assistive device.   Gait with Horizontal  Head Turns Performs head turns smoothly with no change in gait. Deviates no more than 6 in outside 12 in walkway width   Gait with Vertical Head Turns Performs head turns with no change in gait. Deviates no more than 6 in outside 12 in walkway width.   Gait and Pivot Turn Pivot turns safely within 3 sec and stops quickly with no loss of balance.   Step Over Obstacle Is able to step over one shoe box (4.5 in total height) without changing gait speed. No evidence of imbalance.   Gait with Narrow Base of Support Ambulates 4-7 steps.   Gait with Eyes Closed Walks 20 ft, uses assistive device, slower speed, mild gait deviations, deviates 6-10 in outside 12 in walkway width. Ambulates 20 ft in less than 9 sec but greater than 7 sec.   Ambulating Backwards Walks 20 ft, no assistive devices, good speed, no evidence for imbalance, normal gait  9.68 seconds   Steps Alternating feet, must use rail.   Total Score 24                     [facility] Adult PT Treatment/Exercise - 12/17/15 0001    Transfers   Transfers Sit to Stand;Stand to Sit   Sit to Stand 6:  Modified independent (Device/Increase time);Without upper extremity assist;From chair/3-in-1   Five time sit to stand comments  11.0 seconds   Stand to Sit 6: Modified independent (Device/Increase time);Without upper extremity assist;To chair/3-in-1   Ambulation/Gait   Gait velocity 8.22 sec=3.99 ft/sec   Timed Up and Go Test   TUG Normal TUG;Manual TUG;Cognitive TUG   Normal TUG (seconds) 8.49   Manual TUG (seconds) 8.56   Cognitive TUG (seconds) 10.22                PT Education - 12/17/15 0928    Education provided Yes   Education Details [employer], HEP, POP, Fall prevention strategies, PT Evaluation in 6 months   Person(s) Educated Patient   Methods Explanation;Handout   Comprehension Verbalized understanding          PT Short Term Goals - 11/24/15 0945    PT SHORT TERM GOAL #1   Title Pt will be  independent with HEP to address gait, balance, transfers.  TARGET 11/24/15   Status Achieved   PT SHORT TERM GOAL #2   Title Pt will improve 5x sit<>stand test to less than or equal to 12 seconds for improved transfer efficiency and safety.   Baseline 11.25 seconds 11/24/15   Status Achieved   PT SHORT TERM GOAL #3   Title Pt will improve TUG score to less than or equal to 13.5 seconds for decreased fall risk.   Baseline Normal 8.97 sec, manual 9.81 sec, cognitive 9.68 sec on 11/24/15   Status Achieved   PT SHORT TERM GOAL #4   Title Pt will improve Functional Gait Assessment to at least 14/30 for decreased fall risk.   Baseline 15/30 on 11/24/15   Status Achieved   PT SHORT TERM GOAL #5   Title Pt will verbalize understanding of local Parkinson related resources.   Status Achieved           PT Long Term Goals - 12/17/15 0929    PT LONG TERM GOAL #1   Title Pt will verbalize understanding of fall prevention within the home environment.  TARGET 12/27/15   Status Achieved   PT LONG TERM GOAL #2   Title Pt will improve gait velocity to at least 2.62 ft/sec for improved efficiency and safety with gait.   Baseline 3.99 ft/sec on 12/17/15   Status Achieved   PT LONG TERM GOAL #3   Title Pt will improve TUG cognitive to less than or equal to 15 seconds for decreased fall risk.   Baseline 9.68 seconds 11/24/15   Status Achieved   PT LONG TERM GOAL #4   Title Pt will improve Functional GAit Assessment to at least 19/30 for decreased fall risk.   Baseline 24/30   Status Achieved   PT LONG TERM GOAL #5   Title Pt will verbalize plans for continued community fitness upon D/C from PT.   Status Achieved               Plan - 12/17/15 0931    Clinical Impression Statement Pt has met all LTG's and progressed well with therapy.  Discharge from PT per Mady Haagensen, PT.   PT Duration --   PT Next Visit Plan Discharge from PT.   Consulted and Agree with Plan of Care Patient       Patient will benefit from skilled therapeutic intervention in order to improve the following deficits and impairments:     Visit Diagnosis: Unsteadiness  Other abnormalities of gait and  mobility  Other symptoms and signs involving the nervous system       G-Codes - 09-Jan-2016 1151    Functional Assessment Tool Used 5x sit<>stand 11.0 sec  TUG 8.49 sec, TUG manual 8.56 sec, TUG cognitive 10.22 sec, FGA 24/30      Problem List Patient Active Problem List   Diagnosis Date Noted  . Parkinson's disease ([place]) 09/28/2015    Narda Bonds 09-Jan-2016, 11:53 AM  [place] [address] [place], [place], [address] Phone: [phone]   Fax:  [phone]  Name: Ana Salazar MRN: [id number] Date of Birth: Jun 06, 1946    Einar Grad [place] 01/09/2016 11:53 AM Phone: [phone] Fax: [phone]   PHYSICAL THERAPY DISCHARGE SUMMARY  Visits from Start of Care: 13  Current functional level related to goals / functional outcomes: Pt has met all LTGs-     PT Long Term Goals - 2016-01-09 0929      PT LONG TERM GOAL #1   Title Pt will verbalize understanding of fall prevention within the home environment.  TARGET 12/27/15   Status Achieved     PT LONG TERM GOAL #2   Title Pt will improve gait velocity to at least 2.62 ft/sec for improved efficiency and safety with gait.   Baseline 3.99 ft/sec on Jan 09, 2016   Status Achieved     PT LONG TERM GOAL #3   Title Pt will improve TUG cognitive to less than or equal to 15 seconds for decreased fall risk.   Baseline 9.68 seconds 11/24/15   Status Achieved     PT LONG TERM GOAL #4   Title Pt will improve Functional GAit Assessment to at least 19/30 for decreased fall risk.   Baseline 24/30   Status Achieved     PT LONG TERM GOAL #5   Title Pt will verbalize plans for continued community fitness upon D/C from  PT.   Status Achieved        Remaining deficits: Balance, gait, posture   Education / Equipment: Educated in [employer], fall prevention, community fitness.  Plan: Patient agrees to discharge.  Patient goals were met. Patient is being discharged due to meeting the stated rehab goals.  ?????REcommend follow-up eval in 6 months due to progressive nature of disease.  Mady Haagensen, PT 06/21/16 9:37 AM Phone: [phone] Fax: [phone]

## 2015-12-17 NOTE — Patient Instructions (Signed)
Fall Prevention in the Home  Falls can cause injuries and can affect people from all age groups. There are many simple things that you can do to make your home safe and to help prevent falls. WHAT CAN I DO ON THE OUTSIDE OF MY HOME? 1. Regularly repair the edges of walkways and driveways and fix any cracks. 2. Remove high doorway thresholds. 3. Trim any shrubbery on the main path into your home. 4. Use bright outdoor lighting. 5. Clear walkways of debris and clutter, including tools and rocks. 6. Regularly check that handrails are securely fastened and in good repair. Both sides of any steps should have handrails. 7. Install guardrails along the edges of any raised decks or porches. 8. Have leaves, snow, and ice cleared regularly. 9. Use sand or salt on walkways during winter months. 10. In the garage, clean up any spills right away, including grease or oil spills. WHAT CAN I DO IN THE BATHROOM?  Use night lights.  Install grab bars by the toilet and in the tub and shower. Do not use towel bars as grab bars.  Use non-skid mats or decals on the floor of the tub or shower.  If you need to sit down while you are in the shower, use a plastic, non-slip stool.[place] [employer]  Keep the floor dry. Immediately clean up any water that spills on the floor.  Remove soap buildup in the tub or shower on a regular basis.  Attach bath mats securely with double-sided non-slip rug tape.  Remove throw rugs and other tripping hazards from the floor. WHAT CAN I DO IN THE BEDROOM?  Use night lights.  Make sure that a bedside light is easy to reach.  Do not use oversized bedding that drapes onto the floor.  Have a firm chair that has side arms to use for getting dressed.  Remove throw rugs and other tripping hazards from the floor. WHAT CAN I DO IN THE KITCHEN?   Clean up any spills right away.  Avoid walking on wet floors.  Place frequently used items in easy-to-reach places.  If you need to reach for  something above you, use a sturdy step stool that has a grab bar.  Keep electrical cables out of the way.  Do not use floor polish or wax that makes floors slippery. If you have to use wax, make sure that it is non-skid floor wax.  Remove throw rugs and other tripping hazards from the floor. WHAT CAN I DO IN THE STAIRWAYS?  Do not leave any items on the stairs.  Make sure that there are handrails on both sides of the stairs. Fix handrails that are broken or loose. Make sure that handrails are as long as the stairways.  Check any carpeting to make sure that it is firmly attached to the stairs. Fix any carpet that is loose or worn.  Avoid having throw rugs at the top or bottom of stairways, or secure the rugs with carpet tape to prevent them from moving.  Make sure that you have a light switch at the top of the stairs and the bottom of the stairs. If you do not have them, have them installed. WHAT ARE SOME OTHER FALL PREVENTION TIPS?  Wear closed-toe shoes that fit well and support your feet. Wear shoes that have rubber soles or low heels.  When you use a stepladder, make sure that it is completely opened and that the sides are firmly locked. Have someone hold the ladder while you  are using it. Do not climb a closed stepladder.  Add color or contrast paint or tape to grab bars and handrails in your home. Place contrasting color strips on the first and last steps.  Use mobility aids as needed, such as canes, walkers, scooters, and crutches.  Turn on lights if it is dark. Replace any light bulbs that burn out.  Set up furniture so that there are clear paths. Keep the furniture in the same spot.  Fix any uneven floor surfaces.  Choose a carpet design that does not hide the edge of steps of a stairway.  Be aware of any and all pets.  Review your medicines with your healthcare provider. Some medicines can cause dizziness or changes in blood pressure, which increase your risk of  falling. Talk with your health care provider about other ways that you can decrease your risk of falls. This may include working with a physical therapist or trainer to improve your strength, balance, and endurance.   This information is not intended to replace advice given to you by your health care provider. Make sure you discuss any questions you have with your health care provider.   Document Released: 08/12/2002 Document Revised: 01/06/2015 Document Reviewed: 09/26/2014 Elsevier Interactive Patient Education [employer].    It is important to avoid accidents which may result in broken bones.  Here are a few ideas on how to make your home safer so you will be less likely to trip or fall.  Use nonskid mats or non slip strips in your shower or tub, on your bathroom floor and around sinks.  If you know that you have spilled water, wipe it up! In the bathroom, it is important to have properly installed grab bars on the walls or on the edge of the tub.  Towel racks are NOT strong enough for you to hold onto or to pull on for support. Stairs and hallways should have enough light.  Add lamps or night lights if you need ore light. It is good to have handrails on both sides of the stairs if possible.  Always fix broken handrails right away. It is important to see the edges of steps.  Paint the edges of outdoor steps white so you can see them better.  Put colored tape on the edge of inside steps. Throw-rugs are dangerous because they can slide.  Removing the rugs is the best idea, but if they must stay, add adhesive carpet tape to prevent slipping. Do not keep things on stairs or in the halls.  Remove small furniture that blocks the halls as it may cause you to trip.  Keep telephone and electrical cords out of the way where you walk. Always were sturdy, rubber-soled shoes for good support.  Never wear just socks, especially on the stairs.  Socks may cause you to slip or fall.  Do not wear  full-length housecoats as you can easily trip on the bottom.  Place the things you use the most on the shelves that are the easiest to reach.  If you use a stepstool, make sure it is in good condition.  If you feel unsteady, DO NOT climb, ask for help. If a health professional advises you to use a cane or walker, do not be ashamed.  These items can keep you from falling and breaking your bones.

## 2015-12-17 NOTE — Telephone Encounter (Signed)
Patient made aware.

## 2015-12-17 NOTE — Telephone Encounter (Signed)
Rehab is continuing to report that SOB is an issue.  This is not associated with PD and please tell pt that needs to f/u with PCP about that

## 2015-12-17 NOTE — Therapy (Signed)
[facility] [address] [place], [place], [address] Phone: [phone]   Fax:  [phone]  Occupational Therapy Treatment  Patient Details  Name: Ana Salazar MRN: [id number] Date of Birth: Nov 18, 1945 Referring Provider: Dr. Wells Guiles Tat  Encounter Date: 12/17/2015      OT End of Session - 12/17/15 0949    Visit Number 13   Number of Visits 17   Date for OT Re-Evaluation 12/24/15   Authorization Type Medicare, [employer], needs G-code   Authorization Time Period G    Authorization - Visit Number 13   Authorization - Number of Visits 20   OT Start Time [phone]   OT Stop Time 1015   OT Time Calculation (min) 41 min   Activity Tolerance Patient tolerated treatment well   Behavior During Therapy [facility] for tasks assessed/performed      Past Medical History  Diagnosis Date  . Parkinson's disease ([place])   . Hypertension   . Kidney disease   . Tachycardia   . Depression   . Hypothyroidism   . Seasonal allergies     Past Surgical History  Procedure Laterality Date  . Thyroidectomy, partial    . Abdominal hysterectomy    . Cesarean section    . Tonsillectomy      There were no vitals filed for this visit.      Subjective Assessment - 12/17/15 0947    Subjective  "I'm going to do chair volleyball today.   Limitations PD diagnosis 2014; pt reports hx of multiple TIAs   Patient Stated Goals I want to be able to raise my arm more and move better.     Currently in Pain? No/denies                      OT Treatments/Exercises (OP) - 12/17/15 7106    Fine Motor Coordination   Fine Motor Coordination Dealing card with thumb;Stacking coins;Flipping cards;Small Pegboard   Small Pegboard placing small pegs in pegboard with each hand with min cues for use of PWR! hands.   Flipping cards with each hand with min cues for large amplitude movements    Dealing card with thumb with each hand with initial  cueing for large amplitude movement   Stacking coins Picking up coins and manipulating in-hand to stack with each hand with min v.c.   Grooved pegs with min difficulty with in-hand manipulation and cues to use PWR! hands.with each hand          Neuro re-ed:  PWR! Moves (basic 4) in modified quadraped x 10 each with min cues For incr movement amplitude.   Discussed progress towards goals and recommendation for re-eval in approx 6 months.  Pt verbalized understanding.         OT Short Term Goals - 12/17/15 0952    OT SHORT TERM GOAL #1   Title Pt will be independent with PD-specific HEP.--check STGs 11/24/15   Time 4   Period Weeks   Status Achieved  would benefit from reinforcement; met 12/17/15   OT SHORT TERM GOAL #2   Title Pt will verbalize understanding of ways to prevent future complications related to PD and appropriate community resources prn.   Time 4   Period Weeks   Status Achieved  met. 11/24/15   OT SHORT TERM GOAL #3   Title Pt will improve functional reaching/balance for ADLs/IADLs as shown by improving standing functional reach test by at least 2  inches bilaterally.   Baseline R-5.5", L-7"   Time 4   Period Weeks   Status Achieved  11/24/15:  R-13", L-13" will reassess next session for consistency; 11/26/15:  R-12", L-12"   OT SHORT TERM GOAL #4   Title Pt will improve coordination for ADLs as shown by improving time on 9-hole peg test by at least 5 sec with dominant RUE.   Baseline 38.97sec   Period Weeks   Status Achieved  11/24/15:  25.72sec, but will reassess next session to ensure consistency; 11/26/15 19.72sec   OT SHORT TERM GOAL #5   Title Pt will report incr ease with peeling food and opening packages/containers.   Time 4   Period Weeks   Status Achieved  met 11/24/15   OT SHORT TERM GOAL #6   Title Pt will write at least 3 sentences with no significant decr in size and 100% legibility over at least 2 sessions.   Time 4   Period Weeks   Status  Achieved  11/26/15           OT Long Term Goals - 2016-01-08 0952    OT LONG TERM GOAL #1   Title Pt will verbalize understanding of AE/strategies to incr ease with ADLs/IADLs.--check LTGs 12/24/15   Time 8   Period Weeks   Status Achieved   OT LONG TERM GOAL #2   Title Pt will improve bilateral hand coordination/functional reach as shown by improving score on box and blocks test by at least 4 bilaterally.   Baseline R-37 blocks, L-39 blocks   Time 8   Period Weeks   Status Achieved  12/08/15:  R-47blocks, L-53blocks   OT LONG TERM GOAL #3   Title Pt will improve balance/functional reaching for ADLs/IADLs as shown by improving score on standing functional reach test by at least 3 inches with RUE.   Baseline 5.5"   Time 8   Period Weeks   Status Achieved  11/26/15:  12"   OT LONG TERM GOAL #4   Title Pt will improve coordination for ADLs as shown by improving time on 9-hole peg test by at least 10 sec with dominant RUE.   Time 8   Period Weeks   Status Achieved  11/26/15:  19.72sec   OT LONG TERM GOAL #5   Title Pt will be able to carry object in both hands with turns and backing up 1-2steps without LOB or spills to simulate kitchen tasks using big movement strategies.   Time 8   Period Weeks   Status Achieved               Plan - Jan 08, 2016 0949    Clinical Impression Statement Pt has made good progress with goals.     Plan d/c OT; OT evaluation in approx 6 months   OT Home Exercise Plan issued supine and seated PWR!, coordination HEP/PWR! hands (basic 4), PWR! hands, issued; 12/08/15 PWR! moves in modified quadraped (basic 4) with no PWR! up with step   Consulted and Agree with Plan of Care Patient        Patient will benefit from skilled therapeutic intervention in order to improve the following deficits and impairments:     Visit Diagnosis: Other symptoms and signs involving the nervous system  Other symptoms and signs involving the musculoskeletal  system  Other lack of coordination  Abnormal posture  Unsteadiness      G-Codes - Jan 08, 2016 0957    Functional Assessment Tool Used Standing functional reach:  R-12", L-12"; 9-hole peg test:  R-19.72sec, L-25.25sec; box and blocks test:  R-47, L-53 blocks   Functional Limitation Carrying, moving and handling objects   Carrying, Moving and Handling Objects Goal Status ([id number]) At least 1 percent but less than 20 percent impaired, limited or restricted   Carrying, Moving and Handling Objects Discharge Status [phone]) At least 1 percent but less than 20 percent impaired, limited or restricted      OCCUPATIONAL THERAPY DISCHARGE SUMMARY  Visits from Start of Care: 13  Current functional level related to goals / functional outcomes: See above   Remaining deficits: Pt has made good progress with improved visual scanning, bradykinesia, coordination, posture, and balance for ADLs.  Pt continues to report mild cognitive deficits, but [facility] in clinic.     Education / Equipment: Pt instructed in PD-specific HEP, ways to prevent future complications, community resources, strategies for ADLs.  Pt verbalized understanding of all education provided.   Plan: Patient agrees to discharge.  Patient goals were met. Patient is being discharged due to meeting the stated rehab goals.  Pt would benefit from re-evaluation in approx 6 months to assess for need for further therapy/functional changes due to progressive nature of diagnosis.?????          Problem List Patient Active Problem List   Diagnosis Date Noted  . Parkinson's disease ([place]) 09/28/2015    [facility] 12/17/2015, 11:44 AM  [facility] [address] [place], [place], [address] Phone: [phone]   Fax:  [phone]  Name: Ana Salazar MRN: [id number] Date of Birth: 02/14/1946  Vianne Bulls, OTR/L [facility] [address] [place], [place]  [address] [phone] phone [phone] 12/17/2015 11:44 AM

## 2015-12-17 NOTE — Addendum Note (Signed)
Addended by: Vianne Bulls D on: 12/17/2015 01:32 PM   Modules accepted: Orders

## 2016-01-01 ENCOUNTER — Telehealth: Payer: Self-pay | Admitting: Neurology

## 2016-01-01 NOTE — Telephone Encounter (Signed)
Spoke with patient and she states that her dyskinesia has gotten worse where she is having trouble sleeping. She wants to know about cutting back on her Levodopa. She is currently taking Carbidopa Levodopa 25/100 IR - 1 tablet 6 times daily and Carbidopa Levodopa 50/200- 1 tablet 3 times daily. Please advise.   She also called to ask about bladder medication. She states her doctor wanted to put her on Oxybutinin or Myrbetriq and I advised her that Myrbetriq was a better choice. She will let her doctor know.

## 2016-01-01 NOTE — Telephone Encounter (Signed)
Pt has some questions about some medication. She wants to know if she can take with her parkinson medication please call [phone]

## 2016-01-02 NOTE — Telephone Encounter (Signed)
Would rather discuss at an appt.  If doesn't have one upcoming put her in at 8:45 on 5/15 (as long as that slot still open) and we will discuss.

## 2016-01-04 NOTE — Telephone Encounter (Signed)
Left message for patient to call back  

## 2016-01-04 NOTE — Telephone Encounter (Signed)
Follow up appt made with patient.  

## 2016-01-05 ENCOUNTER — Telehealth: Payer: Self-pay | Admitting: Neurology

## 2016-01-05 MED ORDER — CARBIDOPA-LEVODOPA ER 50-200 MG PO TBCR: 1.0000 | EXTENDED_RELEASE_TABLET | Freq: Three times a day (TID) | ORAL | Status: DC

## 2016-01-05 NOTE — Telephone Encounter (Signed)
Carbidopa Levodopa 50/200 refill requested. Per last office note- patient to remain on medication. Refill approved and sent to patient's pharmacy. Patient made aware.

## 2016-01-05 NOTE — Telephone Encounter (Signed)
PT called in regards to a refill of her Carbidopa/Dawn CB# [phone]

## 2016-01-12 ENCOUNTER — Encounter: Payer: Self-pay | Admitting: Neurology

## 2016-01-12 ENCOUNTER — Ambulatory Visit (INDEPENDENT_AMBULATORY_CARE_PROVIDER_SITE_OTHER): Payer: Medicare Other | Admitting: Neurology

## 2016-01-12 VITALS — BP 128/72 | HR 60 | Ht 62.0 in | Wt 175.0 lb

## 2016-01-12 DIAGNOSIS — G249 Dystonia, unspecified: Secondary | ICD-10-CM | POA: Diagnosis not present

## 2016-01-12 DIAGNOSIS — G20 Parkinson's disease: Secondary | ICD-10-CM | POA: Diagnosis not present

## 2016-01-12 NOTE — Progress Notes (Signed)
u   Ana Salazar was seen today in the movement disorders clinic for neurologic consultation at the request of [name], [name], MD.   The patient presents today for a second opinion regarding Parkinson's disease. She is accompanied by her daughter and sister who supplement the history.   I have reviewed an extensive number of her records from her prior neurologist, [name], and I appreciate those records.  The patient presented to [name] first in November, 2014, but reported that she had had symptoms for about a year and a half prior to that.  Her first symptoms consisted of difficulty moving her feet/shuffling the feet; trouble getting out of the bed; tremor (she thinks that it started in both but does state that the right is worse than the left and always has been).  The patient was started on a trial of levodopa at her first visit with [name] and followed up with him the next month and noted great improvement with the medication.  She was started on pramipexole the following visit but noted that it made her dizzy and it was discontinued over the telephone.  The following visit, she was started on entacapone 200 mg 3 times a day.  In April, 2015 her carbidopa/levodopa 25/100 was increased to 2 tablets 3 times per day.  In July, 2016 she was changed to carbidopa/levodopa 50/200, 3 times per day.  In October, 2016 she was placed on a combination of carbidopa/levodopa 50/200, 3 times a day (4-5am, noon, 8pm) in addition to carbidopa/levodopa 25/100, 2 tablets 3 times per day (1200 mg of levodopa per day).  She remains on the entacapone, 200 mg 3 times per day as well as primidone, 50 mg - 1.5 tablets at night, which she is using for tremor control.  She reports that her biggest frustration is that she is always moving and is more dizzy and is starting to have a few falls.  When she is nearing end of dose (1-2 hours prior) she will have tremor in her stomach, followed by her legs and arms.    10/13/15  update:  The patient is following care today, primarily for levodopa challenge.  She is currently off of medication.  She is accompanied by her daughter (different daughter than last visit) who supplements the history.  She last took her medication at 8pm.  Her medication generally consists of carbidopa/levodopa 50/200 3 times per day in addition to carbidopa/levodopa 25/100, 1 tablet 6 times per day.  Splitting the medication did help somewhat, but she thinks that it is not helping as much as it does initially when she first started doing it that way.  She is also on entacapone 200 mg 3 times per day and primidone 75 mg at night.  She has not had any falls since our last visit.  01/12/16 update:  The patient is following up today, as she called me to ask me about potentially decreasing her levodopa. She is accompanied by her sister who supplements the history.   She is on a very large dosage and somewhat of a strange combination, but came to me on this combination.  She is currently taking carbidopa/levodopa 50/200 3 times a day (5am/noon/8pm) and is now spreading out her carbidopa/levodopa 25/100 to one tablet 6 times per day (5am is the start and she takes that q 3 hrs until 8 pm).  She remains on entacapone 200 mg 3 times a day and primidone 75 mg daily.  She goes to  bed at about 8pm.  She has found that she is having more dyskinesia.  She has been working with rehabilitation therapist and I have gotten a couple of correspondence is from them that they have been concerned about labored breathing.   I advised follow-up with her primary care physician if this was true shortness of breath.  She did say that she wore a holter monitor for a month and that eval was negative.  Was told if continues needs pulm.  Only SOB when she is hot or exerting herself.  He also mentioned that they were concerned about inability to track inferiorly.  She did see [name] for her neuropsych testing, done on 12/22/2015.  She got  feedback from them on 01/05/2016.  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  I specifically spoke with [name], and she did not feel there was evidence of an atypical state from her testing.  She felt that she could potentially be a good DBS candidate.  She did recommend a vision evaluation, medication to help with anxiety/sleep/irritability, and a follow-up regarding a history of sleep apnea.  Pt states that she was already to the eye doctor in December at [facility].    Neuroimaging has  previously been performed.  It is not available for my review today.  Pt states that it was done at [facility] (was done prior to going to [name] at [place]).  No fam hx of PD.    ALLERGIES:   Allergies  Allergen Reactions  . Amlodipine Other (See Comments)  . Valsartan Other (See Comments)  . Ampicillin Swelling  . Levothyroxine Other (See Comments)    Tired, shaky, tremors  . Nebivolol Rash  . Meloxicam Swelling  . Prednisone   . Amoxicillin Rash  . Nsaids Rash    CURRENT MEDICATIONS:  Outpatient Encounter Prescriptions as of 01/12/2016  Medication Sig  . carbidopa-levodopa (SINEMET CR) 50-200 MG tablet Take 1 tablet by mouth 3 (three) times daily.  . carbidopa-levodopa (SINEMET IR) 25-100 MG tablet Take 1 tablet by mouth 6 (six) times daily.   . cetirizine (ZYRTEC) 10 MG tablet Take 10 mg by mouth daily.  . citalopram (CELEXA) 20 MG tablet Take 30 mg by mouth daily.  . cloNIDine (CATAPRES) 0.1 MG tablet Take 0.1 mg by mouth 2 (two) times daily.  Marland Kitchen CRANBERRY PO Take by mouth daily.  Marland Kitchen docusate sodium (COLACE) 100 MG capsule Take 100 mg by mouth daily.  . entacapone (COMTAN) 200 MG tablet Take 200 mg by mouth 3 (three) times daily.  Marland Kitchen losartan (COZAAR) 100 MG tablet Take 100 mg by mouth daily.  . potassium chloride (K-DUR,KLOR-CON) 10 MEQ tablet Take 10 mEq by mouth daily.  . primidone (MYSOLINE) 50 MG tablet Take 50 mg by mouth at bedtime.   . verapamil (CALAN) 80 MG tablet  Take 80 mg by mouth 3 (three) times daily.  . [DISCONTINUED] FIBER PO Take by mouth daily.   No facility-administered encounter medications on file as of 01/12/2016.    PAST MEDICAL HISTORY:   Past Medical History  Diagnosis Date  . Parkinson's disease ([place])   . Hypertension   . Kidney disease   . Tachycardia   . Depression   . Hypothyroidism   . Seasonal allergies     PAST SURGICAL HISTORY:   Past Surgical History  Procedure Laterality Date  . Thyroidectomy, partial    . Abdominal hysterectomy    . Cesarean section    .  Tonsillectomy      SOCIAL HISTORY:   Social History   Social History  . Marital Status: Unknown    Spouse Name: N/A  . Number of Children: N/A  . Years of Education: N/A   Occupational History  . retired    Social History Main Topics  . Smoking status: Former Smoker    Quit date: 09/27/2012  . Smokeless tobacco: Not on file  . Alcohol Use: No  . Drug Use: No  . Sexual Activity: Not on file   Other Topics Concern  . Not on file   Social History Narrative    FAMILY HISTORY:   Family Status  Relation Status Death Age  . Mother Deceased     heart disease, DM, complications of fall  . Father Deceased     MI  . Sister Deceased     DM, CAD, renal failure  . Sister Alive     healthy  . Child Alive     healthy    ROS:  A complete 10 system review of systems was obtained and was unremarkable apart from what is mentioned above.  PHYSICAL EXAMINATION:    VITALS:   Filed Vitals:   01/12/16 0855  BP: 128/72  Pulse: 60  Height: 5\' 2"  (1.575 m)  Weight: 175 lb (79.379 kg)    GEN:  The patient appears stated age and is in NAD. HEENT:  Normocephalic, atraumatic.  The mucous membranes are moist. The superficial temporal arteries are without ropiness or tenderness. CV:  RRR Lungs:  CTAB.  There is dyspnea on exertion. Neck/HEME:  There are no carotid bruits bilaterally.  Neurological examination:  Orientation: The patient is alert  and oriented x3. Fund of knowledge is appropriate.   Cranial nerves: There is good facial symmetry.  There is mild facial hypomimia.   The speech is fluent and clear.  She has minimal trouble with the guttural sounds.  No issues with EOM movementSoft palate rises symmetrically and there is no tongue deviation. Hearing is intact to conversational tone. Sensation: Sensation is intact to light touch throughout.  A complete UPDRS motor off/on test was performed.  UPDRS motor off score was 27.  However, she had absolutely no rigidity off of medication and very little tremor.  UPDRS motor on the score was 12.  She was much more stable with ambulation when she was on medication, but did have a significant amount of dyskinesia.  300 mg of levodopa was given during the test given that she is on a fairly large amount home.  Movement examination: Tone: There is normal tone in the bilateral upper extremities. The tone in the lower extremities is normal.  Abnormal movements:There is a mod amount of dyskinesia in the hands and legs.  Coordination: There is decremation with RAM's, seen with toe taps and heel taps bilaterally, right more than left. She has mild difficulty with rapid pronating movements in the upper extremities, again right greater than left. Gait and Station: The patient has no difficulty arising out of a deep-seated chair without the use of the hands. The patient's stride length is normal with exaggerated arm swing due to dyskinesia. The patient has a negative pull test.     ASSESSMENT/PLAN:  1.  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  -The patient is experiencing motor fluctuations including dyskinesia and on/off.  She is on a total of 1200 mg of levodopa per day and takes a combination of  CR and IR.  She is wanting to drop the dose and will change her from this combination of CR/IR to carbidopa/levodopa 25/100, 2/1/2/1/1 and  then at bedtime she will take carbidopa/levodopa 50/200 (8pm).  If she gets stiff, will add amantadine for dyskinesia and increase the IR dose.  This will drop total dose to 900 mg/day  -While her UPDRS motor on/off test definitely showed efficacy with levodopa, I was fairly surprised to see that she had virtually no rigidity off of medication.  She had been off of medication since 8 PM the night before and was examined at 2:30 PM.  It may be that the medication lasted this long, although it is doubtful (although I have seen it before).  The levodopa definitely improved ability to ambulate.    -I had a long discussion with the patient today.  She did well on her neuropsych testing on 12/22/2015 in terms of no evidence of dementia.  However, there has been some concern about an atypical state, primarily from her physical therapist.  Because of that, I would like to wait at least another 6 months to give [place] some time to watch her and me have more time with her personally.  She is a fairly new patient to me.   I did tell her that my suspicion for this is low but we will give it some time.   2.  OSAS and SOB  -states that her PCP is to be referring her.  Would like her to see pulmonary and she will ask PCP if that is where she is being referred. 3.  Much greater than 50% of this visit was spent in counseling with the patient and the family.  Total face to face time:  51

## 2016-01-12 NOTE — Patient Instructions (Signed)
1. Take Carbidopa Levodopa as follows:  Carbidopa Levodopa 25/100 IR: 2 tablets in the morning, 1 tablet mid morning, 2 tablets in the afternoon, 1 tablet mid afternoon, 1 tablet in the evening Carbidopa Levodopa 50/200 CR: 1 tablet at night

## 2016-01-25 ENCOUNTER — Telehealth: Payer: Self-pay | Admitting: Neurology

## 2016-01-25 NOTE — Telephone Encounter (Signed)
Any particular time she feels weak?  Is it wearing off phenomenon?  Any other sx's?  Could she have uti?

## 2016-01-25 NOTE — Telephone Encounter (Signed)
PT called and said she needed to change her medication around/Dawn CB# [phone]

## 2016-01-25 NOTE — Telephone Encounter (Signed)
Spoke with patient and she states that she did have a UTI and just finished her antibiotics on Saturday. Made her aware this is probably why she is having these symptoms. I advised her to stay on current dosage of medication and it may take a little bit of time to start working the same again. Made aware I will let Dr Tat know and make her aware if she should do anything different.

## 2016-01-25 NOTE — Telephone Encounter (Signed)
Spoke with patient - she states she was doing well on the new dosage of Levodopa - carbidopa/levodopa 25/100, 2/1/2/1/1 and carbidopa/levodopa 50/200 1 at 8pm. She states for the past week that she is not moving as well and feels weak in her legs, knees, and arms. No other symptoms. Please advise.

## 2016-01-26 ENCOUNTER — Other Ambulatory Visit: Payer: Self-pay | Admitting: Neurology

## 2016-01-26 ENCOUNTER — Telehealth: Payer: Self-pay | Admitting: Neurology

## 2016-01-26 MED ORDER — CARBIDOPA-LEVODOPA 25-100 MG PO TABS: ORAL_TABLET | ORAL | Status: DC

## 2016-01-26 NOTE — Telephone Encounter (Signed)
Carbidopa Levodopa refill requested. Per last office note- patient to remain on medication. Refill approved and sent to patient's pharmacy.   

## 2016-01-26 NOTE — Telephone Encounter (Signed)
I spoke with patient yesterday (see phone message from 01/25/16) okay to see patient on 5/30?

## 2016-01-26 NOTE — Telephone Encounter (Signed)
Please see message, meant for Dr. Carles Collet.

## 2016-01-26 NOTE — Telephone Encounter (Signed)
Ok.  Just needs time to recover from UTI but pt seems to need reassurance

## 2016-01-26 NOTE — Telephone Encounter (Signed)
Patient made aware.

## 2016-01-26 NOTE — Telephone Encounter (Signed)
PT called and said she is getting worse and wanted to see Dr Tat sooner and needs a call back, I scheduled her for 02/02/2016/Dawn CB# [phone]

## 2016-02-02 ENCOUNTER — Encounter: Payer: Self-pay | Admitting: Neurology

## 2016-02-02 ENCOUNTER — Telehealth: Payer: Self-pay | Admitting: Neurology

## 2016-02-02 ENCOUNTER — Ambulatory Visit (INDEPENDENT_AMBULATORY_CARE_PROVIDER_SITE_OTHER): Payer: Medicare Other | Admitting: Neurology

## 2016-02-02 VITALS — BP 100/60 | HR 95 | Ht 64.0 in | Wt 175.0 lb

## 2016-02-02 DIAGNOSIS — G20 Parkinson's disease: Secondary | ICD-10-CM

## 2016-02-02 DIAGNOSIS — G249 Dystonia, unspecified: Secondary | ICD-10-CM

## 2016-02-02 DIAGNOSIS — J300 Vasomotor rhinitis: Secondary | ICD-10-CM | POA: Diagnosis not present

## 2016-02-02 MED ORDER — ATROPINE SULFATE 1 % OP SOLN: OPHTHALMIC | Status: DC

## 2016-02-02 NOTE — Progress Notes (Signed)
Note sent to Dr. Myles Rosenthal.

## 2016-02-02 NOTE — Progress Notes (Signed)
u   Ana Salazar was seen today in the movement disorders clinic for neurologic consultation at the request of [name], [name], MD.   The patient presents today for a second opinion regarding Parkinson's disease. She is accompanied by her daughter and sister who supplement the history.   I have reviewed an extensive number of her records from her prior neurologist, [name], and I appreciate those records.  The patient presented to [name] first in November, 2014, but reported that she had had symptoms for about a year and a half prior to that.  Her first symptoms consisted of difficulty moving her feet/shuffling the feet; trouble getting out of the bed; tremor (she thinks that it started in both but does state that the right is worse than the left and always has been).  The patient was started on a trial of levodopa at her first visit with [name] and followed up with him the next month and noted great improvement with the medication.  She was started on pramipexole the following visit but noted that it made her dizzy and it was discontinued over the telephone.  The following visit, she was started on entacapone 200 mg 3 times a day.  In April, 2015 her carbidopa/levodopa 25/100 was increased to 2 tablets 3 times per day.  In July, 2016 she was changed to carbidopa/levodopa 50/200, 3 times per day.  In October, 2016 she was placed on a combination of carbidopa/levodopa 50/200, 3 times a day (4-5am, noon, 8pm) in addition to carbidopa/levodopa 25/100, 2 tablets 3 times per day (1200 mg of levodopa per day).  She remains on the entacapone, 200 mg 3 times per day as well as primidone, 50 mg - 1.5 tablets at night, which she is using for tremor control.  She reports that her biggest frustration is that she is always moving and is more dizzy and is starting to have a few falls.  When she is nearing end of dose (1-2 hours prior) she will have tremor in her stomach, followed by her legs and arms.    10/13/15  update:  The patient is following care today, primarily for levodopa challenge.  She is currently off of medication.  She is accompanied by her daughter (different daughter than last visit) who supplements the history.  She last took her medication at 8pm.  Her medication generally consists of carbidopa/levodopa 50/200 3 times per day in addition to carbidopa/levodopa 25/100, 1 tablet 6 times per day.  Splitting the medication did help somewhat, but she thinks that it is not helping as much as it does initially when she first started doing it that way.  She is also on entacapone 200 mg 3 times per day and primidone 75 mg at night.  She has not had any falls since our last visit.  01/12/16 update:  The patient is following up today, as she called me to ask me about potentially decreasing her levodopa. She is accompanied by her sister who supplements the history.   She is on a very large dosage and somewhat of a strange combination, but came to me on this combination.  She is currently taking carbidopa/levodopa 50/200 3 times a day (5am/noon/8pm) and is now spreading out her carbidopa/levodopa 25/100 to one tablet 6 times per day (5am is the start and she takes that q 3 hrs until 8 pm).  She remains on entacapone 200 mg 3 times a day and primidone 75 mg daily.  She goes to  bed at about 8pm.  She has found that she is having more dyskinesia.  She has been working with rehabilitation therapist and I have gotten a couple of correspondence is from them that they have been concerned about labored breathing.   I advised follow-up with her primary care physician if this was true shortness of breath.  She did say that she wore a holter monitor for a month and that eval was negative.  Was told if continues needs pulm.  Only SOB when she is hot or exerting herself.  He also mentioned that they were concerned about inability to track inferiorly.  She did see [name] for her neuropsych testing, done on 12/22/2015.  She got  feedback from them on 01/05/2016.  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  I specifically spoke with [name], and she did not feel there was evidence of an atypical state from her testing.  She felt that she could potentially be a good DBS candidate.  She did recommend a vision evaluation, medication to help with anxiety/sleep/irritability, and a follow-up regarding a history of sleep apnea.  Pt states that she was already to the eye doctor in December at [facility].    02/02/16 update:  The patient is following up today, earlier than expected.  She is accompanied by her sister who supplements the history.  Last visit I changed her daytime extended release levodopa all over 2 immediate release.  Therefore, she was taking carbidopa/levodopa 25/100 as follows: 2 at 5am (she is waking up to take that pill and goes to bed until 7am/1 at 8am/1 at 11 am//2 at 2 pm/1 at 5pm and then would take an additional levodopa/levodopa 50/200 at 8pm.  This dropped her overall load of levodopa from 1200 mg to 900 mg, primarily because she was complaining about dyskinesia.  She called me on May 22 and stated that she was doing well initially, but now feels more weak and stiff.  When I asked her specifically, she admitted that she was being treated for urinary tract infection.  I told her to give that some time to resolve, because that can worsen the symptoms of Parkinson's.  She called the following day and requested a follow-up appointment here, which is the reason for follow-up.  She states that she is weaker, which means that the legs are "tired."  She denies freezing.  She states that overall she is more tired.  No falls.  She states that the sx's do not wax and wane throughout the day.  Her sister thinks that she is doing better but sister does state that she is "starting to get that shuffle."  Last PT was in mid April.  C/o nose dripping for years.    Neuroimaging has  previously been performed.  It is  not available for my review today.  Pt states that it was done at [facility] (was done prior to going to [name] at [place]).  No fam hx of PD.    ALLERGIES:   Allergies  Allergen Reactions  . Amlodipine Other (See Comments)  . Valsartan Other (See Comments)  . Ampicillin Swelling  . Levothyroxine Other (See Comments)    Tired, shaky, tremors  . Nebivolol Rash  . Meloxicam Swelling  . Prednisone   . Amoxicillin Rash  . Nsaids Rash    CURRENT MEDICATIONS:  Outpatient Encounter Prescriptions as of 02/02/2016  Medication Sig  . carbidopa-levodopa (SINEMET CR) 50-200 MG tablet Take 1 tablet  by mouth 3 (three) times daily. (Patient taking differently: Take 1 tablet by mouth at bedtime. )  . carbidopa-levodopa (SINEMET IR) 25-100 MG tablet 2 tablets in the morning, 1 tablet mid morning, 2 tablets in the afternoon, 1 tablet mid afternoon, 1 tablet in the evening  . cetirizine (ZYRTEC) 10 MG tablet Take 10 mg by mouth daily.  . citalopram (CELEXA) 20 MG tablet Take 20 mg by mouth daily.   . cloNIDine (CATAPRES) 0.1 MG tablet Take 0.1 mg by mouth 2 (two) times daily.  Marland Kitchen CRANBERRY PO Take by mouth daily.  Marland Kitchen docusate sodium (COLACE) 100 MG capsule Take 100 mg by mouth daily.  . entacapone (COMTAN) 200 MG tablet Take 200 mg by mouth 3 (three) times daily.  Marland Kitchen losartan (COZAAR) 100 MG tablet Take 100 mg by mouth daily.  . potassium chloride (K-DUR,KLOR-CON) 10 MEQ tablet Take 10 mEq by mouth daily.  . primidone (MYSOLINE) 50 MG tablet Take 50 mg by mouth at bedtime.   . verapamil (CALAN) 80 MG tablet Take 80 mg by mouth 3 (three) times daily.   No facility-administered encounter medications on file as of 02/02/2016.    PAST MEDICAL HISTORY:   Past Medical History  Diagnosis Date  . Parkinson's disease ([place])   . Hypertension   . Kidney disease   . Tachycardia   . Depression   . Hypothyroidism   . Seasonal allergies     PAST SURGICAL HISTORY:   Past Surgical History  Procedure  Laterality Date  . Thyroidectomy, partial    . Abdominal hysterectomy    . Cesarean section    . Tonsillectomy      SOCIAL HISTORY:   Social History   Social History  . Marital Status: Unknown    Spouse Name: N/A  . Number of Children: N/A  . Years of Education: N/A   Occupational History  . retired    Social History Main Topics  . Smoking status: Former Smoker    Quit date: 09/27/2012  . Smokeless tobacco: Not on file  . Alcohol Use: No  . Drug Use: No  . Sexual Activity: Not on file   Other Topics Concern  . Not on file   Social History Narrative    FAMILY HISTORY:   Family Status  Relation Status Death Age  . Mother Deceased     heart disease, DM, complications of fall  . Father Deceased     MI  . Sister Deceased     DM, CAD, renal failure  . Sister Alive     healthy  . Child Alive     healthy    ROS:  A complete 10 system review of systems was obtained and was unremarkable apart from what is mentioned above.  PHYSICAL EXAMINATION:    VITALS:   Filed Vitals:   02/02/16 0903  BP: 100/60  Pulse: 95  Height: 5\' 4"  (1.626 m)  Weight: 175 lb (79.379 kg)    GEN:  The patient appears stated age and is in NAD. HEENT:  Normocephalic, atraumatic.  The mucous membranes are moist. The superficial temporal arteries are without ropiness or tenderness. CV:  RRR Lungs:  CTAB.  There is dyspnea on exertion. Neck/HEME:  There are no carotid bruits bilaterally.  Neurological examination:  Orientation: The patient is alert and oriented x3. Fund of knowledge is appropriate.   Cranial nerves: There is good facial symmetry.  There is mild facial hypomimia.   The speech is fluent and clear.  She has minimal trouble with the guttural sounds.  No issues with EOM movementSoft palate rises symmetrically and there is no tongue deviation. Hearing is intact to conversational tone. Sensation: Sensation is intact to light touch throughout.  Movement examination: Tone:  There is Very minimal rigidity in the right upper extremity. The tone in the lower extremities is normal.  Abnormal movements:In the early part of the visit, the patient had minor tremor in the right upper extremity.  As the visit wore on, she did develop dyskinesia in the right arm and right leg, but it was very mild. Coordination: There is decremation with RAM's, seen with toe taps and heel taps bilaterally, right more than left. She has mild difficulty with rapid pronating movements in the upper extremities, again right greater than left. Gait and Station: The patient has a little difficulty arising out of a deep-seated chair without the use of the hands but she is able to do it on the first attempt. The patient's stride length is normal with normal but not exaggerated arm swing (previously exaggerated arm swing with dyskinesia). The patient has a negative pull test.     ASSESSMENT/PLAN:  1.  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  -The patient is continuing to experience motor fluctuations including dyskinesia and on/off.  She was on a total of 1200 mg of levodopa, but we dropped that to 900 mg because she hated the dyskinesia.  I also changed her from the CR to the IR during the day.  Currently, I asked her to stop taking the first 2 tablets at 5 AM because she is waking up to do that and then going back to sleep.  She will shift her medication so that she is taking carbidopa/levodopa 25/100, 2 tablets at 7 AM/2 tablet at 10 AM (this is an increase from 1 tablet)/2 tablets at 1 PM/1 tablet at 4 PM and then she will decide if she needs her last one tablet at 7 PM, as she takes carbidopa/levodopa  50/200 at bedtime, which is 8 PM.  I may need to consider amantadine for dyskinesia if it returns.    -While her UPDRS motor on/off test definitely showed efficacy with levodopa, I was fairly surprised to see that she had virtually no  rigidity off of medication.  She had been off of medication since 8 PM the night before and was examined at 2:30 PM.  It may be that the medication lasted this long, although it is doubtful (although I have seen it before).  The levodopa definitely improved ability to ambulate.    -I had a long discussion with the patient today.  She did well on her neuropsych testing on 12/22/2015 in terms of no evidence of dementia.  However, there has been some concern about an atypical state, primarily from her physical therapist.  Because of that, I would like to wait at least another 6 months to give [place] some time to watch her and me have more time with her personally.  She is a fairly new patient to me.   I did tell her that my suspicion for this is low but we will give it some time.    -We can probably discontinue her primidone in the near future.  She is on 75 mg daily.  There is likely no indication for this, but I do not want to change too many factors at once.  -For now will remain on the entacapone  3 times per day.  2.  HTN  - on clonidine and losartan and I wonder if she needs that anymore or if she needs both of these.  Wonder if that is contributing to fatigue.  Asked her to make appt with PCP to discuss.  3.  Vasomotor rhinitis  -Prescriptions given for atropine ophthalmic.  She was told how to use this.  She was told not to overuse it as it can have cardiac implications.  She expressed understanding.  3.  OSAS and SOB  -states that she has an upcoming nocturnal polysomnogram and follow-up with pulmonary.  4.  Much greater than 50% of this visit was spent in counseling with the patient and the family.  Total face to face time:  58

## 2016-02-02 NOTE — Telephone Encounter (Signed)
RX was sent to [facility] as this was her default- patient made aware and RX was sent to [facility] [place].

## 2016-02-02 NOTE — Telephone Encounter (Signed)
Ana Salazar [id number] She would like a prescription [facility] to [facility] [place]. She did not say what medication on the voicemail. Her number is [phone]. Thank you

## 2016-02-02 NOTE — Patient Instructions (Signed)
1. Atropine directions: Place 1-2 drops on a q-tip and place in nose no more than every 6 hours. 2. Talk to your PCP about your Clonidine and Losartan - you may not need both medications.  3. Take Carbidopa Levodopa as follows:   Carbidopa Levodopa 25/100 IR : 2 tablets at 7:00 am 2 tablets at 10:00 am  2 tablets at 1:00 pm 1 tablet at 4:00 pm (and if needed you can take 1 tablet at 7:00 pm)  Carbidopa Levodopa 50/200 CR: 1 tablet at 8:00 pm (bedtime)

## 2016-02-23 ENCOUNTER — Ambulatory Visit: Payer: No Typology Code available for payment source | Admitting: Neurology

## 2016-03-15 ENCOUNTER — Other Ambulatory Visit: Payer: Self-pay | Admitting: Neurology

## 2016-03-15 MED ORDER — ENTACAPONE 200 MG PO TABS: 200.0000 mg | ORAL_TABLET | Freq: Three times a day (TID) | ORAL | Status: DC

## 2016-03-15 NOTE — Telephone Encounter (Signed)
[facility] refill requested. Per last office note- patient to remain on medication. Refill approved and sent to patient's pharmacy.   

## 2016-04-19 ENCOUNTER — Other Ambulatory Visit: Payer: Self-pay | Admitting: Neurology

## 2016-04-19 MED ORDER — PRIMIDONE 50 MG PO TABS: 75.0000 mg | ORAL_TABLET | Freq: Every day | ORAL | 2 refills | Status: DC

## 2016-05-03 NOTE — Progress Notes (Signed)
u   Ana Salazar was seen today in the movement disorders clinic for neurologic consultation at the request of [name], [name], MD.   The patient presents today for a second opinion regarding Parkinson's disease. She is accompanied by her daughter and sister who supplement the history.   I have reviewed an extensive number of her records from her prior neurologist, [name], and I appreciate those records.  The patient presented to [name] first in November, 2014, but reported that she had had symptoms for about a year and a half prior to that.  Her first symptoms consisted of difficulty moving her feet/shuffling the feet; trouble getting out of the bed; tremor (she thinks that it started in both but does state that the right is worse than the left and always has been).  The patient was started on a trial of levodopa at her first visit with [name] and followed up with him the next month and noted great improvement with the medication.  She was started on pramipexole the following visit but noted that it made her dizzy and it was discontinued over the telephone.  The following visit, she was started on entacapone 200 mg 3 times a day.  In April, 2015 her carbidopa/levodopa 25/100 was increased to 2 tablets 3 times per day.  In July, 2016 she was changed to carbidopa/levodopa 50/200, 3 times per day.  In October, 2016 she was placed on a combination of carbidopa/levodopa 50/200, 3 times a day (4-5am, noon, 8pm) in addition to carbidopa/levodopa 25/100, 2 tablets 3 times per day (1200 mg of levodopa per day).  She remains on the entacapone, 200 mg 3 times per day as well as primidone, 50 mg - 1.5 tablets at night, which she is using for tremor control.  She reports that her biggest frustration is that she is always moving and is more dizzy and is starting to have a few falls.  When she is nearing end of dose (1-2 hours prior) she will have tremor in her stomach, followed by her legs and arms.    10/13/15  update:  The patient is following care today, primarily for levodopa challenge.  She is currently off of medication.  She is accompanied by her daughter (different daughter than last visit) who supplements the history.  She last took her medication at 8pm.  Her medication generally consists of carbidopa/levodopa 50/200 3 times per day in addition to carbidopa/levodopa 25/100, 1 tablet 6 times per day.  Splitting the medication did help somewhat, but she thinks that it is not helping as much as it does initially when she first started doing it that way.  She is also on entacapone 200 mg 3 times per day and primidone 75 mg at night.  She has not had any falls since our last visit.  01/12/16 update:  The patient is following up today, as she called me to ask me about potentially decreasing her levodopa. She is accompanied by her sister who supplements the history.   She is on a very large dosage and somewhat of a strange combination, but came to me on this combination.  She is currently taking carbidopa/levodopa 50/200 3 times a day (5am/noon/8pm) and is now spreading out her carbidopa/levodopa 25/100 to one tablet 6 times per day (5am is the start and she takes that q 3 hrs until 8 pm).  She remains on entacapone 200 mg 3 times a day and primidone 75 mg daily.  She goes to  bed at about 8pm.  She has found that she is having more dyskinesia.  She has been working with rehabilitation therapist and I have gotten a couple of correspondence is from them that they have been concerned about labored breathing.   I advised follow-up with her primary care physician if this was true shortness of breath.  She did say that she wore a holter monitor for a month and that eval was negative.  Was told if continues needs pulm.  Only SOB when she is hot or exerting herself.  He also mentioned that they were concerned about inability to track inferiorly.  She did see [name] for her neuropsych testing, done on 12/22/2015.  She got  feedback from them on 01/05/2016.  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  I specifically spoke with [name], and she did not feel there was evidence of an atypical state from her testing.  She felt that she could potentially be a good DBS candidate.  She did recommend a vision evaluation, medication to help with anxiety/sleep/irritability, and a follow-up regarding a history of sleep apnea.  Pt states that she was already to the eye doctor in December at [facility].    02/02/16 update:  The patient is following up today, earlier than expected.  She is accompanied by her sister who supplements the history.  Last visit I changed her daytime extended release levodopa all over 2 immediate release.  Therefore, she was taking carbidopa/levodopa 25/100 as follows: 2 at 5am (she is waking up to take that pill and goes to bed until 7am/1 at 8am/1 at 11 am//2 at 2 pm/1 at 5pm and then would take an additional levodopa/levodopa 50/200 at 8pm.  This dropped her overall load of levodopa from 1200 mg to 900 mg, primarily because she was complaining about dyskinesia.  She called me on May 22 and stated that she was doing well initially, but now feels more weak and stiff.  When I asked her specifically, she admitted that she was being treated for urinary tract infection.  I told her to give that some time to resolve, because that can worsen the symptoms of Parkinson's.  She called the following day and requested a follow-up appointment here, which is the reason for follow-up.  She states that she is weaker, which means that the legs are "tired."  She denies freezing.  She states that overall she is more tired.  No falls.  She states that the sx's do not wax and wane throughout the day.  Her sister thinks that she is doing better but sister does state that she is "starting to get that shuffle."  Last PT was in mid April.  C/o nose dripping for years.    05/04/16 update:  The patient follows up today,  accompanied by her sister who supplements the history.  Last visit, the patient wanted to decrease her levodopa load, primarily because of dyskinesia.  We ended up reworking it significantly.  I changed her from the CR formulation to the immediate release formulation and did drop the overall load somewhat.  She is currently taking carbidopa/levodopa 25/100, 2 tablets at 7 AM/2 tablet at 10 AM/2 tablets at 1 PM/1 tablet at 4 PM/carbidopa/levodopa 50/200 at bedtime (8PM).  She cut out the 7pm dosage.    She takes entacapone 3 times per day, which turned out to be every other dose of levodopa.  She is still on primidone, but only on 50 mg  daily, which she was on prior to coming to see me.  She initally states that she isn't better but then states that dyskinesia is "a lot better than it was" which was the primary issue for changing.   She has had several falls that she attributes to looking down when she walks or getting dizzy when she first gets up.  Was able to get off of clonidine and losartan per PCP because of dizziness and that helped dizziness.  She hasn't gotten hurt with falls.   The big issue now is trouble sleeping.  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  She states that she was only using one time per day.  She also told me last visit that she was getting ready to have a sleep study for objective sleep apnea syndrome and a pulmonary consult for shortness of breath.  I do not have that information but she tells me that she does have osas and needs cpap titration and is awaiting that.  Denies depression but daughter thinks that she has it.  Pt doesn't have much to do during the day.  Neuroimaging has  previously been performed.  It is not available for my review today.  Pt states that it was done at [facility] (was done prior to going to [name] at [facility]).  No fam hx of PD.    ALLERGIES:   Allergies  Allergen  Reactions  . Amlodipine Other (See Comments)  . Valsartan Other (See Comments)  . Ampicillin Swelling  . Levothyroxine Other (See Comments)    Tired, shaky, tremors  . Nebivolol Rash  . Meloxicam Swelling  . Prednisone   . Amoxicillin Rash  . Nsaids Rash    CURRENT MEDICATIONS:  Outpatient Encounter Prescriptions as of 05/04/2016  Medication Sig  . atropine 1 % ophthalmic solution Use as directed  . carbidopa-levodopa (SINEMET CR) 50-200 MG tablet Take 1 tablet by mouth 3 (three) times daily. (Patient taking differently: Take 1 tablet by mouth at bedtime. )  . carbidopa-levodopa (SINEMET IR) 25-100 MG tablet 2 tablets in the morning, 1 tablet mid morning, 2 tablets in the afternoon, 1 tablet mid afternoon, 1 tablet in the evening (Patient taking differently: 2 tablets at 7 am, 2 at 10 am, 2 at 1 pm, 1 at 4 pm)  . cetirizine (ZYRTEC) 10 MG tablet Take 10 mg by mouth daily.  . citalopram (CELEXA) 20 MG tablet Take 20 mg by mouth daily.   Marland Kitchen CRANBERRY PO Take by mouth daily.  Marland Kitchen docusate sodium (COLACE) 100 MG capsule Take 100 mg by mouth daily.  . entacapone (COMTAN) 200 MG tablet Take 1 tablet (200 mg total) by mouth 3 (three) times daily.  . Fluticasone Furoate (ARNUITY ELLIPTA) 100 MCG/ACT AEPB Inhale into the lungs.  . potassium chloride (K-DUR,KLOR-CON) 10 MEQ tablet Take 10 mEq by mouth daily.  . primidone (MYSOLINE) 50 MG tablet Take 1.5 tablets (75 mg total) by mouth at bedtime.  . verapamil (CALAN) 80 MG tablet Take 80 mg by mouth 3 (three) times daily.  . [DISCONTINUED] cloNIDine (CATAPRES) 0.1 MG tablet Take 0.1 mg by mouth 2 (two) times daily.  . [DISCONTINUED] losartan (COZAAR) 100 MG tablet Take 100 mg by mouth daily.   No facility-administered encounter medications on file as of 05/04/2016.     PAST MEDICAL HISTORY:   Past Medical History:  Diagnosis Date  . Depression   . Hypertension   .  Hypothyroidism   . Kidney disease   . Parkinson's disease ([place])   . Seasonal  allergies   . Tachycardia     PAST SURGICAL HISTORY:   Past Surgical History:  Procedure Laterality Date  . ABDOMINAL HYSTERECTOMY    . CESAREAN SECTION    . THYROIDECTOMY, PARTIAL    . TONSILLECTOMY      SOCIAL HISTORY:   Social History   Social History  . Marital status: Unknown    Spouse name: N/A  . Number of children: N/A  . Years of education: N/A   Occupational History  . retired    Social History Main Topics  . Smoking status: Former Smoker    Quit date: 09/27/2012  . Smokeless tobacco: Not on file  . Alcohol use No  . Drug use: No  . Sexual activity: Not on file   Other Topics Concern  . Not on file   Social History Narrative  . No narrative on file    FAMILY HISTORY:   Family Status  Relation Status  . Mother Deceased   heart disease, DM, complications of fall  . Father Deceased   MI  . Sister Deceased   DM, CAD, renal failure  . Sister Alive   healthy  . Child Alive   healthy    ROS:  A complete 10 system review of systems was obtained and was unremarkable apart from what is mentioned above.  PHYSICAL EXAMINATION:    VITALS:   Vitals:   05/04/16 1002  BP: 132/70  Pulse: 60  Weight: 178 lb (80.7 kg)  Height: 5\' 5"  (1.651 m)    GEN:  The patient appears stated age and is in NAD. HEENT:  Normocephalic, atraumatic.  The mucous membranes are moist. The superficial temporal arteries are without ropiness or tenderness. CV:  RRR Lungs:  CTAB.  There is dyspnea on exertion. Neck/HEME:  There are no carotid bruits bilaterally.  Neurological examination:  Orientation: The patient is alert and oriented x3. Fund of knowledge is appropriate.   Cranial nerves: There is good facial symmetry.  There is mild facial hypomimia.   The speech is fluent and clear.  She has minimal trouble with the guttural sounds.  No issues with EOM movementSoft palate rises symmetrically and there is no tongue deviation. Hearing is intact to conversational  tone. Sensation: Sensation is intact to light touch throughout.  Movement examination: Tone: There is no rigidity.   Abnormal movements:  She has truncal dyskinesia and left leg dyskinesia Coordination: There is decremation with RAM's, seen with toe taps and heel taps bilaterally, right more than left. She has mild difficulty with rapid pronating movements in the upper extremities, again right greater than left. Gait and Station: The patient has no difficulty arising out of a deep-seated chair without the use of the hands but she is able to do it on the first attempt. The patient's stride length is normal with normal arm swing.   The patient has a negative pull test.     ASSESSMENT/PLAN:  1.  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  -The patient is continuing to experience motor fluctuations including dyskinesia and on/off.    -She is doing better with the IR formulation than the CR and will continue taking carbidopa/levodopa 25/100, 2 tablets at 7 AM/2 tablet at 10 AM (this is an increase from 1 tablet)/2 tablets at 1 PM/1 tablet at 4 PM and then she will decide  if she needs her last one tablet at 7 PM, as she takes carbidopa/levodopa  50/200 at bedtime, which is 8 PM.  I may need to consider amantadine in future but dyskinesia not bothering her right now  -While her UPDRS motor on/off test definitely showed efficacy with levodopa, I was fairly surprised to see that she had virtually no rigidity off of medication.  She had been off of medication since 8 PM the night before and was examined at 2:30 PM.  It may be that the medication lasted this long, although it is doubtful (although I have seen it before).  The levodopa definitely improved ability to ambulate.    -I had a long discussion with the patient today.  She did well on her neuropsych testing on 12/22/2015 in terms of no evidence of dementia.  However, there has been some  concern about an atypical state, primarily from her physical therapist.  Because of that, I would like to wait at least another few months to give [place] some time to watch her and me have more time with her personally.  She is a fairly new patient to me.   I did tell her that my suspicion for this is low but we will give it some time.    -d/c primidone  -For now will remain on the entacapone 3 times per day.  2.  HTN  -off of clonidine and losartan and dizziness is getting better.  -not drinkiing enough water and encouraged her to increase that.  3.  Vasomotor rhinitis  -Has atropine ophthalmic.  She needs to try and increase to tid.    4.  OSAS and SOB  -had recent PSG and told she needed cpap titration.  Awaiting that.  Seeing pulmonary.  Asked about sleep issues (trouble sleeping) and I told her that pulmonary should follow up on that.  Did start remeron as I thought that might help with sleep and depression (she denies but daughter and I think that may be issue)  5.  Much greater than 50% of this visit was spent in counseling with the patient and the family.  Total face to face time:  25

## 2016-05-04 ENCOUNTER — Ambulatory Visit (INDEPENDENT_AMBULATORY_CARE_PROVIDER_SITE_OTHER): Payer: Medicare Other | Admitting: Neurology

## 2016-05-04 ENCOUNTER — Telehealth: Payer: Self-pay | Admitting: Neurology

## 2016-05-04 ENCOUNTER — Encounter: Payer: Self-pay | Admitting: Neurology

## 2016-05-04 VITALS — BP 132/70 | HR 60 | Ht 65.0 in | Wt 178.0 lb

## 2016-05-04 DIAGNOSIS — G4733 Obstructive sleep apnea (adult) (pediatric): Secondary | ICD-10-CM

## 2016-05-04 DIAGNOSIS — G20 Parkinson's disease: Secondary | ICD-10-CM

## 2016-05-04 DIAGNOSIS — G249 Dystonia, unspecified: Secondary | ICD-10-CM | POA: Diagnosis not present

## 2016-05-04 MED ORDER — MIRTAZAPINE 15 MG PO TABS: 15.0000 mg | ORAL_TABLET | Freq: Every day | ORAL | 5 refills | Status: DC

## 2016-05-04 NOTE — Telephone Encounter (Signed)
Spoke with Ana Salazar at [facility]. She states Remeron and Celexa together cause interaction/increased risk of serotonin syndrome. She is going to go ahead and fill medication. Please advise if she should not.

## 2016-05-04 NOTE — Telephone Encounter (Signed)
Jamie with [facility] called in regards to PT and would like a call back/Dawn CB# [phone]

## 2016-05-04 NOTE — Patient Instructions (Addendum)
1.  Discontinue primidone 2.  Start remeron 15 mg at night.   3.  Call your pulmonogist and ask about the sleep apnea test (cpap titration) and if it is scheduled.

## 2016-05-04 NOTE — Telephone Encounter (Signed)
Aware of small risk.

## 2016-05-19 ENCOUNTER — Telehealth: Payer: Self-pay | Admitting: Neurology

## 2016-05-19 DIAGNOSIS — G20 Parkinson's disease: Secondary | ICD-10-CM

## 2016-05-19 NOTE — Telephone Encounter (Signed)
-----   Message from Frazier Butt, PT sent at 05/19/2016 11:26 AM EDT ----- Ana Salazar is scheduled 06/21/16 for PT and OT return Parkinson's evaluations, which patient agreed upon at previous therapy discharge.  Please send orders via [facility] for PT and OT evaluate and treat.  Thanks so much.  Mady Haagensen, PT

## 2016-05-19 NOTE — Telephone Encounter (Signed)
Order entered

## 2016-06-03 NOTE — Progress Notes (Signed)
u   Ana Salazar was seen today in the movement disorders clinic for neurologic consultation at the request of [name], [name], MD.   The patient presents today for a second opinion regarding Parkinson's disease. She is accompanied by her daughter and sister who supplement the history.   I have reviewed an extensive number of her records from her prior neurologist, [name], and I appreciate those records.  The patient presented to [name] first in November, 2014, but reported that she had had symptoms for about a year and a half prior to that.  Her first symptoms consisted of difficulty moving her feet/shuffling the feet; trouble getting out of the bed; tremor (she thinks that it started in both but does state that the right is worse than the left and always has been).  The patient was started on a trial of levodopa at her first visit with [name] and followed up with him the next month and noted great improvement with the medication.  She was started on pramipexole the following visit but noted that it made her dizzy and it was discontinued over the telephone.  The following visit, she was started on entacapone 200 mg 3 times a day.  In April, 2015 her carbidopa/levodopa 25/100 was increased to 2 tablets 3 times per day.  In July, 2016 she was changed to carbidopa/levodopa 50/200, 3 times per day.  In October, 2016 she was placed on a combination of carbidopa/levodopa 50/200, 3 times a day (4-5am, noon, 8pm) in addition to carbidopa/levodopa 25/100, 2 tablets 3 times per day (1200 mg of levodopa per day).  She remains on the entacapone, 200 mg 3 times per day as well as primidone, 50 mg - 1.5 tablets at night, which she is using for tremor control.  She reports that her biggest frustration is that she is always moving and is more dizzy and is starting to have a few falls.  When she is nearing end of dose (1-2 hours prior) she will have tremor in her stomach, followed by her legs and arms.    10/13/15  update:  The patient is following care today, primarily for levodopa challenge.  She is currently off of medication.  She is accompanied by her daughter (different daughter than last visit) who supplements the history.  She last took her medication at 8pm.  Her medication generally consists of carbidopa/levodopa 50/200 3 times per day in addition to carbidopa/levodopa 25/100, 1 tablet 6 times per day.  Splitting the medication did help somewhat, but she thinks that it is not helping as much as it does initially when she first started doing it that way.  She is also on entacapone 200 mg 3 times per day and primidone 75 mg at night.  She has not had any falls since our last visit.  01/12/16 update:  The patient is following up today, as she called me to ask me about potentially decreasing her levodopa. She is accompanied by her sister who supplements the history.   She is on a very large dosage and somewhat of a strange combination, but came to me on this combination.  She is currently taking carbidopa/levodopa 50/200 3 times a day (5am/noon/8pm) and is now spreading out her carbidopa/levodopa 25/100 to one tablet 6 times per day (5am is the start and she takes that q 3 hrs until 8 pm).  She remains on entacapone 200 mg 3 times a day and primidone 75 mg daily.  She goes to  bed at about 8pm.  She has found that she is having more dyskinesia.  She has been working with rehabilitation therapist and I have gotten a couple of correspondence is from them that they have been concerned about labored breathing.   I advised follow-up with her primary care physician if this was true shortness of breath.  She did say that she wore a holter monitor for a month and that eval was negative.  Was told if continues needs pulm.  Only SOB when she is hot or exerting herself.  He also mentioned that they were concerned about inability to track inferiorly.  She did see [name] for her neuropsych testing, done on 12/22/2015.  She got  feedback from them on 01/05/2016.  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  I specifically spoke with [name], and she did not feel there was evidence of an atypical state from her testing.  She felt that she could potentially be a good DBS candidate.  She did recommend a vision evaluation, medication to help with anxiety/sleep/irritability, and a follow-up regarding a history of sleep apnea.  Pt states that she was already to the eye doctor in December at [facility].    02/02/16 update:  The patient is following up today, earlier than expected.  She is accompanied by her sister who supplements the history.  Last visit I changed her daytime extended release levodopa all over 2 immediate release.  Therefore, she was taking carbidopa/levodopa 25/100 as follows: 2 at 5am (she is waking up to take that pill and goes to bed until 7am/1 at 8am/1 at 11 am//2 at 2 pm/1 at 5pm and then would take an additional levodopa/levodopa 50/200 at 8pm.  This dropped her overall load of levodopa from 1200 mg to 900 mg, primarily because she was complaining about dyskinesia.  She called me on May 22 and stated that she was doing well initially, but now feels more weak and stiff.  When I asked her specifically, she admitted that she was being treated for urinary tract infection.  I told her to give that some time to resolve, because that can worsen the symptoms of Parkinson's.  She called the following day and requested a follow-up appointment here, which is the reason for follow-up.  She states that she is weaker, which means that the legs are "tired."  She denies freezing.  She states that overall she is more tired.  No falls.  She states that the sx's do not wax and wane throughout the day.  Her sister thinks that she is doing better but sister does state that she is "starting to get that shuffle."  Last PT was in mid April.  C/o nose dripping for years.    05/04/16 update:  The patient follows up today,  accompanied by her sister who supplements the history.  Last visit, the patient wanted to decrease her levodopa load, primarily because of dyskinesia.  We ended up reworking it significantly.  I changed her from the CR formulation to the immediate release formulation and did drop the overall load somewhat.  She is currently taking carbidopa/levodopa 25/100, 2 tablets at 7 AM/2 tablet at 10 AM/2 tablets at 1 PM/1 tablet at 4 PM/carbidopa/levodopa 50/200 at bedtime (8PM).  She cut out the 7pm dosage.    She takes entacapone 3 times per day, which turned out to be every other dose of levodopa.  She is still on primidone, but only on 50 mg  daily, which she was on prior to coming to see me.  She initally states that she isn't better but then states that dyskinesia is "a lot better than it was" which was the primary issue for changing.   She has had several falls that she attributes to looking down when she walks or getting dizzy when she first gets up.  Was able to get off of clonidine and losartan per PCP because of dizziness and that helped dizziness.  She hasn't gotten hurt with falls.   The big issue now is trouble sleeping.  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  She states that she was only using one time per day.  She also told me last visit that she was getting ready to have a sleep study for objective sleep apnea syndrome and a pulmonary consult for shortness of breath.  I do not have that information but she tells me that she does have osas and needs cpap titration and is awaiting that.  Denies depression but daughter thinks that she has it.  Pt doesn't have much to do during the day.  06/06/16 update: The patient follows up today, accompanied by her sister and daughter who supplement the history.  She is currently taking carbidopa/levodopa 25/100, 2 tablets at 7 AM/2 tablet at 10 AM/2 tablets at 1 PM/1 tablet at 4  PM/carbidopa/levodopa 50/200 at bedtime (8PM).  She is on entacapone 200 mg 3 times per day.  States that "my legs are really tired." Thinks that this is mostly in middle of the day/early AM and then "I spend the entire day trying to catch up."   Finds that if she takes an extra carbidopa/levodopa 25/100 in the middle of the night she does better.  Last visit, I discontinued her primidone, 50 mg daily.  She was complaining about vasomotor rhinorrhea last visit, and I told her to increase her atropine drops to 3 times a day as needed.  She states that she hasn't do that.  I started remeron last visit for depression and sleep.  Is still on celexa.  Remeron definitely helped sleep but got CPAP since our last visit as well.  Sister states that she is using that and is now screaming and hollaring and hitting the walls at night, but seems this was going on before CPAP.    Neuroimaging has  previously been performed.  It is not available for my review today.  Pt states that it was done at [facility] (was done prior to going to [name] at [facility]).  No fam hx of PD.    ALLERGIES:   Allergies  Allergen Reactions  . Amlodipine Other (See Comments)  . Valsartan Other (See Comments)  . Ampicillin Swelling  . Levothyroxine Other (See Comments)    Tired, shaky, tremors  . Nebivolol Rash  . Meloxicam Swelling  . Prednisone   . Amoxicillin Rash  . Nsaids Rash    CURRENT MEDICATIONS:  Outpatient Encounter Prescriptions as of 06/06/2016  Medication Sig  . atropine 1 % ophthalmic solution Use as directed (Patient taking differently: Use as directed in nose)  . carbidopa-levodopa (SINEMET CR) 50-200 MG tablet Take 1 tablet by mouth 3 (three) times daily. (Patient taking differently: Take 1 tablet by mouth at bedtime. )  . carbidopa-levodopa (SINEMET IR) 25-100 MG tablet 2 tablets in the morning, 1 tablet mid morning, 2 tablets in the afternoon, 1 tablet mid afternoon,  1 tablet in the evening (Patient taking  differently: 2 tablets at 7 am, 2 at 10 am, 2 at 1 pm, 1 at 4 pm)  . cetirizine (ZYRTEC) 10 MG tablet Take 10 mg by mouth daily.  . citalopram (CELEXA) 20 MG tablet Take 20 mg by mouth daily.   Marland Kitchen CRANBERRY PO Take by mouth daily.  Marland Kitchen docusate sodium (COLACE) 100 MG capsule Take 100 mg by mouth daily.  . entacapone (COMTAN) 200 MG tablet Take 1 tablet (200 mg total) by mouth 3 (three) times daily.  . Fluticasone Furoate (ARNUITY ELLIPTA) 100 MCG/ACT AEPB Inhale into the lungs.  . furosemide (LASIX) 20 MG tablet TAKE 1/2 TABLET DAILY AS NEEDED FOR SIGNIFICANT FLUID SWELLING ONLY  . mirtazapine (REMERON) 15 MG tablet Take 1 tablet (15 mg total) by mouth at bedtime.  . potassium chloride (K-DUR,KLOR-CON) 10 MEQ tablet Take 10 mEq by mouth daily.  . verapamil (CALAN) 80 MG tablet Take 80 mg by mouth 3 (three) times daily.   No facility-administered encounter medications on file as of 06/06/2016.     PAST MEDICAL HISTORY:   Past Medical History:  Diagnosis Date  . Depression   . Hypertension   . Hypothyroidism   . Kidney disease   . Parkinson's disease ([place])   . Seasonal allergies   . Tachycardia     PAST SURGICAL HISTORY:   Past Surgical History:  Procedure Laterality Date  . ABDOMINAL HYSTERECTOMY    . CESAREAN SECTION    . THYROIDECTOMY, PARTIAL    . TONSILLECTOMY      SOCIAL HISTORY:   Social History   Social History  . Marital status: Unknown    Spouse name: N/A  . Number of children: N/A  . Years of education: N/A   Occupational History  . retired    Social History Main Topics  . Smoking status: Former Smoker    Quit date: 09/27/2012  . Smokeless tobacco: Not on file  . Alcohol use No  . Drug use: No  . Sexual activity: Not on file   Other Topics Concern  . Not on file   Social History Narrative  . No narrative on file    FAMILY HISTORY:   Family Status  Relation Status  . Mother Deceased   heart disease, DM, complications of fall  . Father Deceased    MI  . Sister Deceased   DM, CAD, renal failure  . Sister Alive   healthy  . Child Alive   healthy    ROS:  A complete 10 system review of systems was obtained and was unremarkable apart from what is mentioned above.  PHYSICAL EXAMINATION:    VITALS:   Vitals:   06/06/16 1356  BP: 120/62  Pulse: 66  Weight: 182 lb (82.6 kg)  Height: 5\' 5"  (1.651 m)    GEN:  The patient appears stated age and is in NAD. HEENT:  Normocephalic, atraumatic.  The mucous membranes are moist. The superficial temporal arteries are without ropiness or tenderness. CV:  RRR Lungs:  CTAB.  There is dyspnea on exertion. Neck/HEME:  There are no carotid bruits bilaterally.  Neurological examination:  Orientation: The patient is alert and oriented x3. Fund of knowledge is appropriate.   Cranial nerves: There is good facial symmetry.  There is mild facial hypomimia.   The speech is fluent and clear.  She has minimal trouble with the guttural sounds.  No issues with EOM movement.  Soft palate rises symmetrically and  there is no tongue deviation. Hearing is intact to conversational tone. Sensation: Sensation is intact to light touch throughout.  Movement examination: Tone: There is no rigidity.   Abnormal movements:  She has truncal dyskinesia and left leg dyskinesia Coordination: There is decremation with RAM's, seen with toe taps and heel taps bilaterally, right more than left. She has mild difficulty with rapid pronating movements in the upper extremities, again right greater than left. Gait and Station: The patient has no difficulty arising out of a deep-seated chair without the use of the hands. The patient's stride length is normal with normal arm swing.   The patient has a negative pull test.     ASSESSMENT/PLAN:  1.  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  -The patient is continuing to experience motor fluctuations including  dyskinesia and on/off.    -She is doing better with the IR formulation than the CR and will continue taking carbidopa/levodopa 25/100, 2 tablets at 7 AM/2 tablet at 10 AM//2 tablets at 1 PM/1 tablet at 4 PM and then she will decide if she needs her last one tablet at 7 PM, as she takes carbidopa/levodopa  50/200 at bedtime, which is 8 PM.  She asked if she can take an extra carbidopa/levodopa 25/100 in the middle of the night and I have no objection to that. I may need to consider amantadine in future but dyskinesia not bothering her right now  -While her UPDRS motor on/off test in 10/2015 definitely showed efficacy with levodopa, I was fairly surprised to see that she had virtually no rigidity off of medication.  She had been off of medication since 8 PM the night before and was examined at 2:30 PM.  It may be that the medication lasted this long, although it is doubtful (although I have seen it before).  The levodopa definitely improved ability to ambulate.  She really wants to consider surgery now that I feel confident that she doesn't have an atypical state.  Will re-do levodopa challenge and leave her off meds x 2 days prior to test.  She did well on her neuropsych testing on 12/22/2015 in terms of no evidence of dementia.  -off of primidone  -For now will remain on the entacapone 3 times per day.  -talked to her about rock steady boxing in [place] and encouraged her to attend  2.  RBD  -start klonopin - 0.5 mg - 1/2 po qhs  Risks, benefits, side effects and alternative therapies were discussed.  The opportunity to ask questions was given and they were answered to the best of my ability.  The patient expressed understanding and willingness to follow the outlined treatment protocols.  -hold remeron since that was primarily for sleep (it did work if need to come back to that)  -get bed rails  3.  Vasomotor rhinitis  -Has atropine ophthalmic.  She needs to try and increase to tid.    4.  OSAS and  SOB  -just got started with CPAP and may need chin strap.  Told her to discuss with home company  5.  Much greater than 50% of this visit was spent in counseling with the patient and the family.  Total face to face time:  45 min

## 2016-06-06 ENCOUNTER — Encounter: Payer: Self-pay | Admitting: Neurology

## 2016-06-06 ENCOUNTER — Ambulatory Visit (INDEPENDENT_AMBULATORY_CARE_PROVIDER_SITE_OTHER): Payer: Medicare Other | Admitting: Neurology

## 2016-06-06 VITALS — BP 120/62 | HR 66 | Ht 65.0 in | Wt 182.0 lb

## 2016-06-06 DIAGNOSIS — G249 Dystonia, unspecified: Secondary | ICD-10-CM

## 2016-06-06 DIAGNOSIS — G20 Parkinson's disease: Secondary | ICD-10-CM

## 2016-06-06 DIAGNOSIS — G4752 REM sleep behavior disorder: Secondary | ICD-10-CM

## 2016-06-06 DIAGNOSIS — J300 Vasomotor rhinitis: Secondary | ICD-10-CM

## 2016-06-06 DIAGNOSIS — G4733 Obstructive sleep apnea (adult) (pediatric): Secondary | ICD-10-CM | POA: Diagnosis not present

## 2016-06-06 MED ORDER — CLONAZEPAM 0.5 MG PO TABS: 0.2500 mg | ORAL_TABLET | Freq: Every day | ORAL | 2 refills | Status: DC

## 2016-06-06 NOTE — Patient Instructions (Addendum)
1. Stop Remeron (Mirtazapine)  2. Wait one week and start Clonazepam 0.5 mg - take 1/2 tablet at bedtime. Prescription sent to your pharmacy.   3. Get bed rails for your bed  4. You can take an extra Carbidopa Levodopa 25/100 IR during the night if needed.   5. For information about the Parkinson's Exercise Scholarship contact:  Phillis Haggis [phone] [email or web address]  6. We have you scheduled for your on/off test on 07/21/16 at 2:00 pm. Please do not take your Parkinson's medications on this date. Take your last dose on 07/19/16 (the evening dose).

## 2016-06-21 ENCOUNTER — Ambulatory Visit: Payer: Medicare Other | Admitting: Occupational Therapy

## 2016-06-21 ENCOUNTER — Ambulatory Visit: Payer: Medicare Other | Attending: Neurology | Admitting: Physical Therapy

## 2016-06-21 DIAGNOSIS — R29818 Other symptoms and signs involving the nervous system: Secondary | ICD-10-CM

## 2016-06-21 DIAGNOSIS — R2689 Other abnormalities of gait and mobility: Secondary | ICD-10-CM | POA: Insufficient documentation

## 2016-06-21 DIAGNOSIS — R293 Abnormal posture: Secondary | ICD-10-CM | POA: Diagnosis present

## 2016-06-21 DIAGNOSIS — R29898 Other symptoms and signs involving the musculoskeletal system: Secondary | ICD-10-CM | POA: Diagnosis present

## 2016-06-21 DIAGNOSIS — R278 Other lack of coordination: Secondary | ICD-10-CM | POA: Diagnosis present

## 2016-06-21 DIAGNOSIS — R2681 Unsteadiness on feet: Secondary | ICD-10-CM | POA: Diagnosis present

## 2016-06-22 NOTE — Therapy (Signed)
[facility] [address] [place], [place], [address] Phone: [phone]   Fax:  [phone]  Physical Therapy Evaluation  Patient Details  Name: Ana Salazar MRN: [id number] Date of Birth: 09/11/1945 Referring Provider: Alonza Bogus, DO  Encounter Date: 06/21/2016      PT End of Session - 06/22/16 0916    Visit Number 1   Number of Visits 1  per patient request-Eval only   Authorization Type Medicare/[employer] [place] (2nd)-G-code    PT Start Time 0937   PT Stop Time 1016   PT Time Calculation (min) 39 min   Activity Tolerance Patient tolerated treatment well   Behavior During Therapy [facility] for tasks assessed/performed      Past Medical History:  Diagnosis Date  . Depression   . Hypertension   . Hypothyroidism   . Kidney disease   . Parkinson's disease ([place])   . Seasonal allergies   . Tachycardia     Past Surgical History:  Procedure Laterality Date  . ABDOMINAL HYSTERECTOMY    . CESAREAN SECTION    . THYROIDECTOMY, PARTIAL    . TONSILLECTOMY      There were no vitals filed for this visit.       Subjective Assessment - 06/21/16 0941    Subjective Pt reports she is doing exercises at home, has just started [employer] class.  Pt reports having 3-4 falls in the past 6 months, due to being in a hurry-usually with getting up and down and with turning.  Pt reports one fall getting up from the floor at [employer] class. Pt does not use assistive device.   Patient Stated Goals Pt's goals for therapy are to improve balance and help with leg weakness.   Currently in Pain? No/denies  Slight soreness in UEs from boxing class-does not rate            [facility] PT Assessment - 06/21/16 0943      Assessment   Medical Diagnosis Parkinson's disease   Referring Provider Wells Guiles Tat, DO   Onset Date/Surgical Date --  Recent bout of PT ended 12/17/15     Precautions   Precautions Fall     Balance Screen    Has the patient fallen in the past 6 months Yes   How many times? 3-4   Has the patient had a decrease in activity level because of a fear of falling?  No   Is the patient reluctant to leave their home because of a fear of falling?  No     Home Social worker Private residence   Living Arrangements Other relatives  Sister   Available Help at Discharge Family   Type of [place] to enter   Entrance Stairs-Number of Steps 2   [place] One level   [place] seat;Grab bars - tub/shower;Cane - single point;Walker - 2 wheels   Additional Comments HAs difficulty pulling open the heavy door to get into home     Prior Function   Level of Independence Independent with basic ADLs;Independent with household mobility without device;Independent with community mobility without device   Vocation Retired   Leisure Is trying the [employer] class, used to enjoy walking around town and shopping.     Posture/Postural Control   Posture/Postural Control Postural limitations   Postural Limitations Forward head;Rounded Shoulders   Posture Comments Dyskinesias present in sitting  Strength   Overall Strength Comments Grossly tested 4/5 bilateral lower extremities     Transfers   Transfers Sit to Stand;Stand to Sit   Sit to Stand 6: Modified independent (Device/Increase time);Without upper extremity assist;From chair/3-in-1   Five time sit to stand comments  12.15     Ambulation/Gait   Ambulation/Gait Yes   Ambulation/Gait Assistance 5: Supervision   Ambulation Distance (Feet) 300 Feet   Gait Pattern Step-through pattern;Decreased arm swing - right;Decreased arm swing - left;Decreased step length - right;Decreased step length - left;Decreased trunk rotation;Poor foot clearance - left;Poor foot clearance - right;Narrow base of support   Ambulation Surface Level;Indoor   Gait velocity 10.08 sec = 3.25 ft/sec      Timed Up and Go Test   Normal TUG (seconds) 11.18   Cognitive TUG (seconds) 12.48     High Level Balance   High Level Balance Comments MiniBESTest score:  18/28 (Scores <20 indicate increased fall risk)     Functional Gait  Assessment   Gait assessed  Yes   Gait Level Surface Walks 20 ft in less than 7 sec but greater than 5.5 sec, uses assistive device, slower speed, mild gait deviations, or deviates 6-10 in outside of the 12 in walkway width.  6.37 sec   Change in Gait Speed Able to change speed, demonstrates mild gait deviations, deviates 6-10 in outside of the 12 in walkway width, or no gait deviations, unable to achieve a major change in velocity, or uses a change in velocity, or uses an assistive device.   Gait with Horizontal Head Turns Performs head turns smoothly with slight change in gait velocity (eg, minor disruption to smooth gait path), deviates 6-10 in outside 12 in walkway width, or uses an assistive device.   Gait with Vertical Head Turns Performs task with slight change in gait velocity (eg, minor disruption to smooth gait path), deviates 6 - 10 in outside 12 in walkway width or uses assistive device   Gait and Pivot Turn Pivot turns safely in greater than 3 sec and stops with no loss of balance, or pivot turns safely within 3 sec and stops with mild imbalance, requires small steps to catch balance.   Step Over Obstacle Is able to step over one shoe box (4.5 in total height) without changing gait speed. No evidence of imbalance.   Gait with Narrow Base of Support Ambulates less than 4 steps heel to toe or cannot perform without assistance.   Gait with Eyes Closed Walks 20 ft, slow speed, abnormal gait pattern, evidence for imbalance, deviates 10-15 in outside 12 in walkway width. Requires more than 9 sec to ambulate 20 ft.  11.13   Ambulating Backwards Walks 20 ft, uses assistive device, slower speed, mild gait deviations, deviates 6-10 in outside 12 in walkway width.   17.68 sec   Steps Alternating feet, must use rail.   Total Score 17   FGA comment: Scores <22/30 indicate increased fall risk.           Mini-BESTest: Balance Evaluation Systems Test  2005-2013 [employer]. All rights reserved. ________________________________________________________________________________________Anticipatory_________Subscore___4__/6 1. SIT TO STAND Instruction: "Cross your arms across your chest. Try not to use your hands unless you must.Do not let your legs lean against the back of the chair when you stand. Please stand up now." X(2) Normal: Comes to stand without use of hands and stabilizes independently. (1) Moderate: Comes to stand WITH use of hands on first attempt. (0) Severe:  Unable to stand up from chair without assistance, OR needs several attempts with use of hands. 2. RISE TO TOES Instruction: "Place your feet shoulder width apart. Place your hands on your hips. Try to rise as high as you can onto your toes. I will count out loud to 3 seconds. Try to hold this pose for at least 3 seconds. Look straight ahead. Rise now." (2) Normal: Stable for 3 s with maximum height. X(1) Moderate: Heels up, but not full range (smaller than when holding hands), OR noticeable instability for 3 s. (0) Severe: < 3 s. 3. STAND ON ONE LEG Instruction: "Look straight ahead. Keep your hands on your hips. Lift your leg off of the ground behind you without touching or resting your raised leg upon your other standing leg. Stay standing on one leg as long as you can. Look straight ahead. Lift now." Left: Time in Seconds Trial 1:__3.39 sec___Trial 2:__2.42 sec___ (2) Normal: 20 s. X(1) Moderate: < 20 s. (0) Severe: Unable. Right: Time in Seconds Trial 1:_3.08 sec____Trial 2:__0.88 sec___ (2) Normal: 20 s. X(1) Moderate: < 20 s. (0) Severe: Unable To score each side separately use the trial with the longest time. To calculate the sub-score and total  score use the side [left or right] with the lowest numerical score [i.e. the worse side]. ______________________________________________________________________________________Reactive Postural Control___________Subscore:__4___/6 4. COMPENSATORY STEPPING CORRECTION- FORWARD Instruction: "Stand with your feet shoulder width apart, arms at your sides. Lean forward against my hands beyond your forward limits. When I let go, do whatever is necessary, including taking a step, to avoid a fall." (2) Normal: Recovers independently with a single, large step (second realignment step is allowed). X(1) Moderate: More than one step used to recover equilibrium. (0) Severe: No step, OR would fall if not caught, OR falls spontaneously. 5. COMPENSATORY STEPPING CORRECTION- BACKWARD Instruction: "Stand with your feet shoulder width apart, arms at your sides. Lean backward against my hands beyond your backward limits. When I let go, do whatever is necessary, including taking a step, to avoid a fall." (2) Normal: Recovers independently with a single, large step. X(1) Moderate: More than one step used to recover equilibrium. (0) Severe: No step, OR would fall if not caught, OR falls spontaneously. 6. COMPENSATORY STEPPING CORRECTION- LATERAL Instruction: "Stand with your feet together, arms down at your sides. Lean into my hand beyond your sideways limit. When I let go, do whatever is necessary, including taking a step, to avoid a fall." Left X(2) Normal: Recovers independently with 1 step (crossover or lateral OK). (1) Moderate: Several steps to recover equilibrium. (0) Severe: Falls, or cannot step. Right X(2) Normal: Recovers independently with 1 step (crossover or lateral OK). (1) Moderate: Several steps to recover equilibrium. (0) Severe: Falls, or cannot step. Use the side with the lowest score to calculate sub-score and total  score. ____________________________________________________________________________________Sensory Orientation_____________Subscore:____4____/6 7. STANCE (FEET TOGETHER); EYES OPEN, FIRM SURFACE Instruction: "Place your hands on your hips. Place your feet together until almost touching. Look straight ahead. Be as stable and still as possible, until I say stop." Time in seconds:________ X(2) Normal: 30 s. (1) Moderate: < 30 s. (0) Severe: Unable. 8. STANCE (FEET TOGETHER); EYES CLOSED, FOAM SURFACE Instruction: "Step onto the foam. Place your hands on your hips. Place your feet together until almost touching. Be as stable and still as possible, until I say stop. I will start timing when you close your eyes." Time in seconds:________ (2) Normal: 30 s. X(1) Moderate: < 30 s. (0)  Severe: Unable. 9. INCLINE- EYES CLOSED Instruction: "Step onto the incline ramp. Please stand on the incline ramp with your toes toward the top. Place your feet shoulder width apart and have your arms down at your sides. I will start timing when you close your eyes." Time in seconds:________ (2) Normal: Stands independently 30 s and aligns with gravity. X(1) Moderate: Stands independently <30 s OR aligns with surface. (0) Severe: Unable. _________________________________________________________________________________________Dynamic Gait ______Subscore___6_____/10 10. CHANGE IN GAIT SPEED Instruction: "Begin walking at your normal speed, when I tell you 'fast', walk as fast as you can. When I say 'slow', walk very slowly." (2) Normal: Significantly changes walking speed without imbalance. X(1) Moderate: Unable to change walking speed or signs of imbalance. (0) Severe: Unable to achieve significant change in walking speed AND signs of imbalance. [place] - HORIZONTAL Instruction: "Begin walking at your normal speed, when I say "right", turn your head and look to the right. When I say "left" turn  your head and look to the left. Try to keep yourself walking in a straight line." (2) Normal: performs head turns with no change in gait speed and good balance. X(1) Moderate: performs head turns with reduction in gait speed. (0) Severe: performs head turns with imbalance. 12. WALK WITH PIVOT TURNS Instruction: "Begin walking at your normal speed. When I tell you to 'turn and stop', turn as quickly as you can, face the opposite direction, and stop. After the turn, your feet should be close together." (2) Normal: Turns with feet close FAST (< 3 steps) with good balance. X(1) Moderate: Turns with feet close SLOW (>4 steps) with good balance. (0) Severe: Cannot turn with feet close at any speed without imbalance. 13. STEP OVER OBSTACLES Instruction: "Begin walking at your normal speed. When you get to the box, step over it, not around it and keep walking." X(2) Normal: Able to step over box with minimal change of gait speed and with good balance. (1) Moderate: Steps over box but touches box OR displays cautious behavior by slowing gait. (0) Severe: Unable to step over box OR steps around box. 14. TIMED UP & GO WITH DUAL TASK [3 METER WALK] Instruction TUG: "When I say 'Go', stand up from chair, walk at your normal speed across the tape on the floor, turn around, and come back to sit in the chair." Instruction TUG with Dual Task: "Count backwards by threes starting at ___. When I say 'Go', stand up from chair, walk at your normal speed across the tape on the floor, turn around, and come back to sit in the chair. Continue counting backwards the entire time." TUG: __11.18 ______seconds; Dual Task TUG: ____12.48____seconds (2) Normal: No noticeable change in sitting, standing or walking while backward counting when compared to TUG without Dual Task. X(1) Moderate: Dual Task affects either counting OR walking (>10%) when compared to the TUG without Dual Task. (0) Severe: Stops counting while walking  OR stops walking while counting. When scoring item 14, if subject's gait speed slows more than 10% between the TUG without and with a Dual Task the score should be decreased by a point. TOTAL SCORE: _____18___/28                             Plan - 06/22/16 0917    Clinical Impression Statement Pt is a 71 year old female with history of Parkinson's disease, who presents to OP PT for return  PT/PD eval today.  She was seen for therapy and discharged approximately 6 months ago.  Pt presents to therapy eval today with reports of 3-4 falls in past 6 months, with recent fall occurring while getting up from floor at [facility] class.  Pt presents with slowed gait velocity compared to discharge, decreased balance (FGA score 24/30 at discharge, 17/30 today) and fall risk per MiniBESTest score of 18/28.  Pt would benefit from skilled therapy services to address the above stated deficits; however, patient declines therapy at this time.  She feels that her needs are being met in [employer] class, which she reports will address her balance and prevent falls.  PT discussed importance of skilled, one-on-one therapy services to address balance, gait needs, but ultimately pt declines PT at this time and would like to reassess at beginning of 2018.   PT Next Visit Plan No further PT; eval only, per patient request.   Consulted and Agree with Plan of Care Patient      Patient will benefit from skilled therapeutic intervention in order to improve the following deficits and impairments:     Visit Diagnosis: Other abnormalities of gait and mobility  Unsteadiness on feet  Other symptoms and signs involving the nervous system      G-Codes - 2016-07-10 0921    Functional Assessment Tool Used 5x sit<>stand >12 sec, TUG 11.18 sec, FGA 17/30, gait velocity 3.25 ft/sec; MIniBESTest 18/28   Functional Limitation Mobility: Walking and moving around   Mobility: Walking and  Moving Around Current Status [phone]) At least 20 percent but less than 40 percent impaired, limited or restricted   Mobility: Walking and Moving Around Goal Status [phone]) At least 20 percent but less than 40 percent impaired, limited or restricted   Mobility: Walking and Moving Around Discharge Status [phone]) At least 20 percent but less than 40 percent impaired, limited or restricted       Problem List Patient Active Problem List   Diagnosis Date Noted  . 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 Parkinson's disease ([place]) 09/28/2015    Schyler Butikofer W. 06/22/2016, 9:23 AM Frazier Butt., PT  [facility] [address] [place], [place], [address] Phone: [phone]   Fax:  [phone]  Name: Lamiya Naas MRN: [id number] Date of Birth: 01-23-1946

## 2016-06-23 NOTE — Therapy (Signed)
[facility] [address] [place], [place], [address] Phone: [phone]   Fax:  [phone]  Occupational Therapy Evaluation  Patient Details  Name: Ana Salazar MRN: [id number] Date of Birth: June 25, 1946 Referring Provider: Dr. Wells Guiles Tat  Encounter Date: 06/21/2016    Past Medical History:  Diagnosis Date  . Depression   . Hypertension   . Hypothyroidism   . Kidney disease   . Parkinson's disease ([place])   . Seasonal allergies   . Tachycardia     Past Surgical History:  Procedure Laterality Date  . ABDOMINAL HYSTERECTOMY    . CESAREAN SECTION    . THYROIDECTOMY, PARTIAL    . TONSILLECTOMY      There were no vitals filed for this visit.      Subjective Assessment - 06/23/16 0856    Subjective  Pt reports that she started [employer] yesterday and wants to try it first instead of receiving therapy at this time   Limitations PD diagnosis 2014; pt reports hx of multiple TIAs, depression, HTN, hypothyroidism, kidney disease, tachycardia, dyskinesias and on/off times    Patient Stated Goals pt wants to try [employer] first since it is closer to her house and that told her that her balance will get better   Currently in Pain? No/denies            [facility] OT Assessment - 06/23/16 0001      Assessment   Diagnosis Parkinson's Disease   Referring Provider Dr. Wells Guiles Tat   Prior Therapy last OT d/c 12/17/15     Precautions   Precautions Fall     Balance Screen   Has the patient fallen in the past 6 months Yes   How many times? 3-4     Home  Environment   Family/patient expects to be discharged to: Private residence   Lives With Family     Prior Function   Level of Independence Independent with basic ADLs;Independent with household mobility without device;Independent with community mobility without device   Vocation Retired   Leisure Is trying the [employer] class, used to enjoy  walking around town and shopping.     ADL   Eating/Feeding Modified independent  difficulty opening packages    Grooming Modified independent   Lower Body Bathing Modified independent   Upper Body Dressing --  mod I    Lower Body Dressing Modified independent   Toilet Tranfer Modified independent   Toileting - Clothing Manipulation Modified independent   Tub/Shower Transfer Modified independent   Warden/ranger Grab bars     IADL   Prior Level of Function Shopping mod I   Prior Level of Function Light Housekeeping mod I, difficulty feeding dog (outside with tree roots), dog jumps  with 1 fall   Meal Prep Plans, prepares and serves adequate meals independently   Programmer, applications own vehicle   Medication Management Is responsible for taking medication in correct dosages at correct time   Physiological scientist financial matters independently (budgets, writes checks, pays rent, bills goes to bank), collects and keeps track of income  doesn't do much Librarian, academic Expression   Dominant Hand Right   Handwriting Mild micrographia     Vision - History   Baseline Vision Bifocals   Additional Comments Pt denies diplopia     Vision Assessment   Ocular Range of Motion Within Functional Limits  grossly   Tracking/Visual Pursuits Decreased  smoothing of vertical tracking  mild     Cognition   Overall Cognitive Status Within Functional Limits for tasks assessed  pt denies change     Observation/Other Assessments   Standing Functional Reach Test R-6", L-11"   Physical Performance Test   Yes   Simulated Eating Time (seconds) 11.50   Donning Doffing Jacket Comments buttoning/unbuttoning 3 buttons on table in 29.15sec with bradykinesia noted     Coordination   Right 9 Hole Peg Test 31.54   Left 9 Hole Peg Test 25.06   Box and Blocks 47   Tremors 48   Other min dyskinesias noted     AROM   Overall AROM  Within functional limits for tasks  performed     RUE Tone   RUE Tone --  very minimal rigidity with distraction     LUE Tone   LUE Tone Within Functional Limits                           OT Short Term Goals - 06/23/16 0856      OT SHORT TERM GOAL #1   Title --------------------------------------------   Status --     OT SHORT TERM GOAL #2   Title ------------------------------------------   Status --     OT SHORT TERM GOAL #3   Title ----------------------------------------------------   Time --   Status --     OT SHORT TERM GOAL #4   Title -----------------------------------------------------   Baseline --   Status --     OT SHORT TERM GOAL #5   Title --------------------------------------------------------   Status --     OT SHORT TERM GOAL #6   Title -------------------------------------------   Status --           OT Long Term Goals - 06/23/16 0857      OT LONG TERM GOAL #1   Title ---------------------------     OT LONG TERM GOAL #2   Title ---------------------------     OT LONG TERM GOAL #3   Title -------------------------------     OT LONG TERM GOAL #4   Title --------------------------------------     OT LONG TERM GOAL #5   Title -------------------------------               Plan - 06/23/16 [id number]    Clinical Impression Statement Pt is a 70 y.o. female with diagnosis of Parkinson's disease returning for evaluation per OT recommendation when last d/c from OT.  Pt with PMH that includes:   PD diagnosis 2014, pt reports hx of multiple TIAs, depression, HTN, hypothyroidism, kidney disease, tachycardia, dyskinesias and on/off times.   Pt presents with decr coordination/timing deficits, bradykinesia/hypokinesia, mild rigidity RUE, decr balance/hx of falls, abnormal posture.  Pt demo decline in coordination measurements, incr in falls, incr bradykinesia, and incr difficulty with PPT#4 since last OT d/c.  Recommended skilled occupational therapy at this time due  to these changes; however, pt wishes to continue with [employer] instead.  Pt reports that if she continues to fall, she will pursue new orders for therapy.   Rehab Potential Good   OT Frequency One time visit  eval only per pt request   OT Treatment/Interventions Self-care/ADL training;Neuromuscular education;Therapeutic exercise;Functional Mobility Training;Patient/family education;Balance training;Splinting;Manual Therapy;Therapeutic exercises;Energy conservation;Ultrasound;Cryotherapy;DME and/or AE instruction;Therapeutic activities;Cognitive remediation/compensation;Passive range of motion;Electrical Stimulation;Moist Heat   Plan recommended skilled occupational therapy to address functional changes/decline in objective measurements and incr in falls since last d/c from OT; however,  pt wants to try [employer] instead.  Pt plans to pursue therapy referrals if balance does not improve or she feels [employer] isn't helping.   OT Home Exercise Plan --   Consulted and Agree with Plan of Care Patient      Patient will benefit from skilled therapeutic intervention in order to improve the following deficits and impairments:  Decreased mobility, Impaired UE functional use, Decreased knowledge of use of DME, Decreased balance, Decreased activity tolerance, Impaired tone, Decreased range of motion, Decreased coordination  Visit Diagnosis: Other symptoms and signs involving the nervous system - Plan: Ot plan of care cert/re-cert  Other symptoms and signs involving the musculoskeletal system - Plan: Ot plan of care cert/re-cert  Other lack of coordination - Plan: Ot plan of care cert/re-cert  Abnormal posture - Plan: Ot plan of care cert/re-cert  Unsteadiness on feet - Plan: Ot plan of care cert/re-cert  Other abnormalities of gait and mobility - Plan: Ot plan of care cert/re-cert      G-Codes - [id number]    Functional Assessment Tool Used R 9-hole peg test 31.54sec    Functional Limitation Carrying, moving and handling objects   Carrying, Moving and Handling Objects Current Status [phone]) At least 20 percent but less than 40 percent impaired, limited or restricted   Carrying, Moving and Handling Objects Goal Status [id number]) At least 20 percent but less than 40 percent impaired, limited or restricted   Carrying, Moving and Handling Objects Discharge Status [id number]) At least 20 percent but less than 40 percent impaired, limited or restricted      Problem List Patient Active Problem List   Diagnosis Date Noted  . 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 Parkinson's disease ([place]) 09/28/2015    [facility] 06/23/2016, 9:18 AM  [facility] [address] [place], [place], [address] Phone: [phone]   Fax:  [phone]  Name: Ana Salazar MRN: [id number] Date of Birth: 11/02/1945   Vianne Bulls, OTR/L [facility] [address] [place], [place]  [address] [phone] phone [phone] 06/23/16 9:18 AM

## 2016-07-01 ENCOUNTER — Ambulatory Visit: Payer: No Typology Code available for payment source | Admitting: Neurology

## 2016-07-20 NOTE — Progress Notes (Signed)
u   Ana Salazar was seen today in the movement disorders clinic for neurologic consultation at the request of [name], [name], MD.   The patient presents today for a second opinion regarding Parkinson's disease. She is accompanied by her daughter and sister who supplement the history.   I have reviewed an extensive number of her records from her prior neurologist, Dr. Blenda Salazar, and I appreciate those records.  The patient presented to Dr. Blenda Salazar first in November, 2014, but reported that she had had symptoms for about a year and a half prior to that.  Her first symptoms consisted of difficulty moving her feet/shuffling the feet; trouble getting out of the bed; tremor (she thinks that it started in both but does state that the right is worse than the left and always has been).  The patient was started on a trial of levodopa at her first visit with Dr. Blenda Salazar and followed up with him the next month and noted great improvement with the medication.  She was started on pramipexole the following visit but noted that it made her dizzy and it was discontinued over the telephone.  The following visit, she was started on entacapone 200 mg 3 times a day.  In April, 2015 her carbidopa/levodopa 25/100 was increased to 2 tablets 3 times per day.  In July, 2016 she was changed to carbidopa/levodopa 50/200, 3 times per day.  In October, 2016 she was placed on a combination of carbidopa/levodopa 50/200, 3 times a day (4-5am, noon, 8pm) in addition to carbidopa/levodopa 25/100, 2 tablets 3 times per day (1200 mg of levodopa per day).  She remains on the entacapone, 200 mg 3 times per day as well as primidone, 50 mg - 1.5 tablets at night, which she is using for tremor control.  She reports that her biggest frustration is that she is always moving and is more dizzy and is starting to have a few falls.  When she is nearing end of dose (1-2 hours prior) she will have tremor in her stomach, followed by her legs and arms.    10/13/15  update:  The patient is following care today, primarily for levodopa challenge.  She is currently off of medication.  She is accompanied by her daughter (different daughter than last visit) who supplements the history.  She last took her medication at 8pm.  Her medication generally consists of carbidopa/levodopa 50/200 3 times per day in addition to carbidopa/levodopa 25/100, 1 tablet 6 times per day.  Splitting the medication did help somewhat, but she thinks that it is not helping as much as it does initially when she first started doing it that way.  She is also on entacapone 200 mg 3 times per day and primidone 75 mg at night.  She has not had any falls since our last visit.  01/12/16 update:  The patient is following up today, as she called me to ask me about potentially decreasing her levodopa. She is accompanied by her sister who supplements the history.   She is on a very large dosage and somewhat of a strange combination, but came to me on this combination.  She is currently taking carbidopa/levodopa 50/200 3 times a day (5am/noon/8pm) and is now spreading out her carbidopa/levodopa 25/100 to one tablet 6 times per day (5am is the start and she takes that q 3 hrs until 8 pm).  She remains on entacapone 200 mg 3 times a day and primidone 75 mg daily.  She goes to  bed at about 8pm.  She has found that she is having more dyskinesia.  She has been working with rehabilitation therapist and I have gotten a couple of correspondence is from them that they have been concerned about labored breathing.   I advised follow-up with her primary care physician if this was true shortness of breath.  She did say that she wore a holter monitor for a month and that eval was negative.  Was told if continues needs pulm.  Only SOB when she is hot or exerting herself.  He also mentioned that they were concerned about inability to track inferiorly.  She did see Dr. Leonides Salazar for her neuropsych testing, done on 12/22/2015.  She got  feedback from them on 01/05/2016.  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  I specifically spoke with Dr. Leonides Salazar, and she did not feel there was evidence of an atypical state from her testing.  She felt that she could potentially be a good DBS candidate.  She did recommend a vision evaluation, medication to help with anxiety/sleep/irritability, and a follow-up regarding a history of sleep apnea.  Pt states that she was already to the eye doctor in December at [facility].    02/02/16 update:  The patient is following up today, earlier than expected.  She is accompanied by her sister who supplements the history.  Last visit I changed her daytime extended release levodopa all over 2 immediate release.  Therefore, she was taking carbidopa/levodopa 25/100 as follows: 2 at 5am (she is waking up to take that pill and goes to bed until 7am/1 at 8am/1 at 11 am//2 at 2 pm/1 at 5pm and then would take an additional levodopa/levodopa 50/200 at 8pm.  This dropped her overall load of levodopa from 1200 mg to 900 mg, primarily because she was complaining about dyskinesia.  She called me on May 22 and stated that she was doing well initially, but now feels more weak and stiff.  When I asked her specifically, she admitted that she was being treated for urinary tract infection.  I told her to give that some time to resolve, because that can worsen the symptoms of Parkinson's.  She called the following day and requested a follow-up appointment here, which is the reason for follow-up.  She states that she is weaker, which means that the legs are "tired."  She denies freezing.  She states that overall she is more tired.  No falls.  She states that the sx's do not wax and wane throughout the day.  Her sister thinks that she is doing better but sister does state that she is "starting to get that shuffle."  Last PT was in mid April.  C/o nose dripping for years.    05/04/16 update:  The patient follows up today,  accompanied by her sister who supplements the history.  Last visit, the patient wanted to decrease her levodopa load, primarily because of dyskinesia.  We ended up reworking it significantly.  I changed her from the CR formulation to the immediate release formulation and did drop the overall load somewhat.  She is currently taking carbidopa/levodopa 25/100, 2 tablets at 7 AM/2 tablet at 10 AM/2 tablets at 1 PM/1 tablet at 4 PM/carbidopa/levodopa 50/200 at bedtime (8PM).  She cut out the 7pm dosage.    She takes entacapone 3 times per day, which turned out to be every other dose of levodopa.  She is still on primidone, but only on 50 mg  daily, which she was on prior to coming to see me.  She initally states that she isn't better but then states that dyskinesia is "a lot better than it was" which was the primary issue for changing.   She has had several falls that she attributes to looking down when she walks or getting dizzy when she first gets up.  Was able to get off of clonidine and losartan per PCP because of dizziness and that helped dizziness.  She hasn't gotten hurt with falls.   The big issue now is trouble sleeping.  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  She states that she was only using one time per day.  She also told me last visit that she was getting ready to have a sleep study for objective sleep apnea syndrome and a pulmonary consult for shortness of breath.  I do not have that information but she tells me that she does have osas and needs cpap titration and is awaiting that.  Denies depression but daughter thinks that she has it.  Pt doesn't have much to do during the day.  06/06/16 update: The patient follows up today, accompanied by her sister and daughter who supplement the history.  She is currently taking carbidopa/levodopa 25/100, 2 tablets at 7 AM/2 tablet at 10 AM/2 tablets at 1 PM/1 tablet at 4  PM/carbidopa/levodopa 50/200 at bedtime (8PM).  She is on entacapone 200 mg 3 times per day.  States that "my legs are really tired." Thinks that this is mostly in middle of the day/early AM and then "I spend the entire day trying to catch up."   Finds that if she takes an extra carbidopa/levodopa 25/100 in the middle of the night she does better.  Last visit, I discontinued her primidone, 50 mg daily.  She was complaining about vasomotor rhinorrhea last visit, and I told her to increase her atropine drops to 3 times a day as needed.  She states that she hasn't do that.  I started remeron last visit for depression and sleep.  Is still on celexa.  Remeron definitely helped sleep but got CPAP since our last visit as well.  Sister states that she is using that and is now screaming and hollaring and hitting the walls at night, but seems this was going on before CPAP.    07/21/16 update:  Pt returns again for levodopa challenge test.  2 of her daughters accompany her and supplement the history.  She is on carbidopa/levodopa 25/100, 2 tablets at 7 AM/2 tablet at 10 AM//2 tablets at 1 PM/1 tablet at 4 PM and then she will decide if she needs her last one tablet at 7 PM.  She also takes carbidopa/levodopa  50/200 at bedtime, which is 8 PM.  She is on comtan 200 mg tid.  She has been off of all PD med for about 60 hrs for this test.  She does feel more slowly and stiff, although she does feel less dizzy getting off of the medication.  Neuroimaging has  previously been performed.  It is not available for my review today.  Pt states that it was done at [facility] (was done prior to going to Dr. Blenda Salazar at [place]).  No fam hx of PD.    ALLERGIES:   Allergies  Allergen Reactions  . Amlodipine Other (See Comments)  . Valsartan Other (See Comments)  . Ampicillin Swelling  . Levothyroxine  Other (See Comments)    Tired, shaky, tremors  . Nebivolol Rash  . Meloxicam Swelling  . Prednisone   . Amoxicillin Rash  .  Nsaids Rash    CURRENT MEDICATIONS:  Outpatient Encounter Prescriptions as of 07/21/2016  Medication Sig  . atropine 1 % ophthalmic solution Use as directed (Patient taking differently: Use as directed in nose)  . carbidopa-levodopa (SINEMET CR) 50-200 MG tablet Take 1 tablet by mouth 3 (three) times daily. (Patient taking differently: Take 1 tablet by mouth at bedtime. )  . carbidopa-levodopa (SINEMET IR) 25-100 MG tablet 2 tablets in the morning, 1 tablet mid morning, 2 tablets in the afternoon, 1 tablet mid afternoon, 1 tablet in the evening (Patient taking differently: 2 tablets at 7 am, 2 at 10 am, 2 at 1 pm, 1 at 4 pm)  . cetirizine (ZYRTEC) 10 MG tablet Take 10 mg by mouth daily.  . citalopram (CELEXA) 20 MG tablet Take 20 mg by mouth daily.   . clonazePAM (KLONOPIN) 0.5 MG tablet Take 0.5 tablets (0.25 mg total) by mouth at bedtime.  Marland Kitchen CRANBERRY PO Take by mouth daily.  Marland Kitchen docusate sodium (COLACE) 100 MG capsule Take 100 mg by mouth daily.  . entacapone (COMTAN) 200 MG tablet Take 1 tablet (200 mg total) by mouth 3 (three) times daily.  . Fluticasone Furoate (ARNUITY ELLIPTA) 100 MCG/ACT AEPB Inhale into the lungs.  . furosemide (LASIX) 20 MG tablet TAKE 1/2 TABLET DAILY AS NEEDED FOR SIGNIFICANT FLUID SWELLING ONLY  . potassium chloride (Salazar-DUR,KLOR-CON) 10 MEQ tablet Take 10 mEq by mouth daily.  . verapamil (CALAN) 80 MG tablet Take 80 mg by mouth 3 (three) times daily.  . [EXPIRED] carbidopa-levodopa (SINEMET IR) 25-100 MG per tablet immediate release 3 tablet    No facility-administered encounter medications on file as of 07/21/2016.     PAST MEDICAL HISTORY:   Past Medical History:  Diagnosis Date  . Depression   . Hypertension   . Hypothyroidism   . Kidney disease   . Parkinson's disease ([place])   . Seasonal allergies   . Tachycardia     PAST SURGICAL HISTORY:   Past Surgical History:  Procedure Laterality Date  . ABDOMINAL HYSTERECTOMY    . CESAREAN SECTION    .  THYROIDECTOMY, PARTIAL    . TONSILLECTOMY      SOCIAL HISTORY:   Social History   Social History  . Marital status: Unknown    Spouse name: N/A  . Number of children: N/A  . Years of education: N/A   Occupational History  . retired    Social History Main Topics  . Smoking status: Former Smoker    Quit date: 09/27/2012  . Smokeless tobacco: Not on file  . Alcohol use No  . Drug use: No  . Sexual activity: Not on file   Other Topics Concern  . Not on file   Social History Narrative  . No narrative on file    FAMILY HISTORY:   Family Status  Relation Status  . Mother Deceased   heart disease, DM, complications of fall  . Father Deceased   MI  . Sister Deceased   DM, CAD, renal failure  . Sister Alive   healthy  . Child Alive   healthy    ROS:  A complete 10 system review of systems was obtained and was unremarkable apart from what is mentioned above.  PHYSICAL EXAMINATION:    VITALS:   Vitals:   07/21/16 1344  BP: 120/72  Pulse: 62  Weight: 178 lb (80.7 kg)  Height: 5\' 5"  (1.651 m)    GEN:  The patient appears stated age and is in NAD. HEENT:  Normocephalic, atraumatic.  The mucous membranes are moist. The superficial temporal arteries are without ropiness or tenderness. CV:  RRR Lungs:  CTAB.  There is dyspnea on exertion. Neck/HEME:  There are no carotid bruits bilaterally.  Neurological examination:  Orientation: The patient is alert and oriented x3. Fund of knowledge is appropriate.   Cranial nerves: There is good facial symmetry.  There is mild facial hypomimia.   The speech is fluent and clear.  She has minimal trouble with the guttural sounds.  No issues with EOM movement.  Soft palate rises symmetrically and there is no tongue deviation. Hearing is intact to conversational tone. Sensation: Sensation is intact to light touch throughout.  A complete UPDRS motor was done both on and off medication.  UPDRS motor SCORE was 36.  The patient was  then given 300 mg of levodopa dissolved in ginger ale.  She was allowed to sit for 35 minutes.  UPDRS motor score on medication was 22.  The patient had no tremor following administration of medication.  She had very little rigidity to begin with, with the exception of the right leg, and that was gone following administration of medication.  She did have some slurring of speech following medication.    ASSESSMENT/PLAN:  1.  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  -The patient is continuing to experience motor fluctuations including dyskinesia and on/off.    -She is doing better with the IR formulation than the CR and will continue taking carbidopa/levodopa 25/100, 2 tablets at 7 AM/2 tablet at 10 AM//2 tablets at 1 PM/1 tablet at 4 PM and then she will decide if she needs her last one tablet at 7 PM, as she takes carbidopa/levodopa  50/200 at bedtime, which is 8 PM.  She asked if she can take an extra carbidopa/levodopa 25/100 in the middle of the night and I have no objection to that. I may need to consider amantadine in future but dyskinesia not bothering her right now  -For now will remain on the entacapone 3 times per day.  -She is doing rock study boxing and I congratulated her on that.  -I talked to the patient and her daughters about the logistics associated with DBS therapy.  I talked to the patient about risks/benefits/side effects of DBS therapy.  We talked about risks which included but were not limited to infection, paralysis, intraoperative seizure, death, stroke, bleeding around the electrode.   I talked to patient about fiducial placement 1 week prior to DBS therapy.  I talked to the patient about what to expect in the operating room, including the fact that this is an awake surgery.  We talked about battery placement as well as which is done under general anesthesia, generally approximately one week following the initial surgery.   We also talked about the fact that the patient will need to be off of medications for surgery.  The patient and family were given the opportunity to ask questions, which they did, and I answered them to the best of my ability today.  She would like to proceed.  She had neuropsych testing in April, 2017 and they do not think she needs that repeated.  She will have preoperative MRI.  I will get her an appointment to  meet Dr. Vertell Limber.  Once those are done, I will make her an appointment back here to answer any further questions and do preoperative video.   2.  RBD  -Continue klonopin - 0.5 mg - 1/2 po qhs  Risks, benefits, side effects and alternative therapies were discussed.  The opportunity to ask questions was given and they were answered to the best of my ability.  The patient expressed understanding and willingness to follow the outlined treatment protocols.  -hold remeron since that was primarily for sleep (it did work if need to come back to that)  -get bed rails  3.  Vasomotor rhinitis  -Has atropine ophthalmic.  She needs to try and increase to tid.    4.  OSAS and SOB  -just got started with CPAP and may need chin strap.  Told her to discuss with home company as she is still having difficulty tolerating this.  5.  Much greater than 50% of this visit was spent in counseling with the patient and the family.  Total face to face time:  75 min and this did not include the 30 minute wait time, waiting for the levodopa to kick in.

## 2016-07-21 ENCOUNTER — Encounter: Payer: Self-pay | Admitting: Neurology

## 2016-07-21 ENCOUNTER — Ambulatory Visit (INDEPENDENT_AMBULATORY_CARE_PROVIDER_SITE_OTHER): Payer: Medicare Other | Admitting: Neurology

## 2016-07-21 VITALS — BP 120/72 | HR 62 | Ht 65.0 in | Wt 178.0 lb

## 2016-07-21 DIAGNOSIS — G4752 REM sleep behavior disorder: Secondary | ICD-10-CM

## 2016-07-21 DIAGNOSIS — G20 Parkinson's disease: Secondary | ICD-10-CM

## 2016-07-21 DIAGNOSIS — G249 Dystonia, unspecified: Secondary | ICD-10-CM | POA: Diagnosis not present

## 2016-07-21 MED ORDER — CARBIDOPA-LEVODOPA 25-100 MG PO TABS
3.0000 | ORAL_TABLET | Freq: Once | ORAL | Status: AC
  Administered 2016-07-21: 3 via ORAL

## 2016-07-22 ENCOUNTER — Telehealth: Payer: Self-pay | Admitting: Neurology

## 2016-07-22 DIAGNOSIS — Z01818 Encounter for other preprocedural examination: Secondary | ICD-10-CM

## 2016-07-22 DIAGNOSIS — G20 Parkinson's disease: Secondary | ICD-10-CM

## 2016-07-22 NOTE — Telephone Encounter (Signed)
Patient made aware of appt date/time.  

## 2016-07-22 NOTE — Telephone Encounter (Signed)
Preop DBS MRI scheduled at [facility] on 07/26/16 to arrive at 2:45 pm.   Left message on machine for patient to call back.

## 2016-07-22 NOTE — Telephone Encounter (Signed)
Referral faxed to [facility] at [phone] with confirmation received. They will contact the patient to schedule.  

## 2016-07-25 ENCOUNTER — Other Ambulatory Visit: Payer: Self-pay | Admitting: Neurology

## 2016-07-26 ENCOUNTER — Ambulatory Visit (HOSPITAL_COMMUNITY)
Admission: RE | Admit: 2016-07-26 | Discharge: 2016-07-26 | Disposition: A | Discharge status: Home / Self Care | Payer: Medicare Other | Source: Ambulatory Visit | Attending: Neurology | Admitting: Neurology

## 2016-07-26 ENCOUNTER — Encounter (HOSPITAL_COMMUNITY): Payer: Self-pay | Admitting: Radiology

## 2016-07-26 DIAGNOSIS — G20 Parkinson's disease: Secondary | ICD-10-CM | POA: Insufficient documentation

## 2016-07-26 DIAGNOSIS — I739 Peripheral vascular disease, unspecified: Secondary | ICD-10-CM | POA: Insufficient documentation

## 2016-07-26 DIAGNOSIS — Z01818 Encounter for other preprocedural examination: Secondary | ICD-10-CM | POA: Insufficient documentation

## 2016-07-26 LAB — CREATININE, SERUM
CREATININE: 0.79 mg/dL (ref 0.44–1.00)
GFR calc Af Amer: >60 mL/min (ref >60)

## 2016-07-26 MED ORDER — GADOBENATE DIMEGLUMINE 529 MG/ML IV SOLN
17.0000 mL | Freq: Once | INTRAVENOUS | Status: AC | PRN
  Administered 2016-07-26: 17 mL via INTRAVENOUS

## 2016-08-02 ENCOUNTER — Telehealth: Payer: Self-pay | Admitting: Neurology

## 2016-08-02 DIAGNOSIS — Z01818 Encounter for other preprocedural examination: Secondary | ICD-10-CM

## 2016-08-02 DIAGNOSIS — G20 Parkinson's disease: Secondary | ICD-10-CM

## 2016-08-02 MED ORDER — DIAZEPAM 5 MG PO TABS: ORAL_TABLET | ORAL | 0 refills | Status: DC

## 2016-08-02 NOTE — Telephone Encounter (Signed)
MR scheduled on 08/09/16 at 4:00 pm. Patient made aware. Valium sent to pharmacy. Aware needs a driver.

## 2016-08-02 NOTE — Telephone Encounter (Signed)
-----   Message from [place], DO sent at 07/30/2016 12:36 PM EST ----- Ana Salazar, these images are not adequate for DBS planning.  There is too much motion artifact.  I see that they tried several times.  Did she take valium?  If so, may need to be re-done under anesthesia but these images won't work

## 2016-08-02 NOTE — Telephone Encounter (Signed)
Patient did not take valium. We will reschedule imaging with valium.

## 2016-08-05 ENCOUNTER — Telehealth: Payer: Self-pay | Admitting: Neurology

## 2016-08-05 NOTE — Telephone Encounter (Signed)
Left message for patient that the MRI has been approved but that it is not a guarantee of payment.

## 2016-08-05 NOTE — Telephone Encounter (Signed)
Patent wants to know if her ins in going to pay for the MRI please call [phone]

## 2016-08-09 ENCOUNTER — Ambulatory Visit (HOSPITAL_COMMUNITY)
Admission: RE | Admit: 2016-08-09 | Discharge: 2016-08-09 | Disposition: A | Discharge status: Home / Self Care | Payer: Medicare Other | Source: Ambulatory Visit | Attending: Neurology | Admitting: Neurology

## 2016-08-09 DIAGNOSIS — G20 Parkinson's disease: Secondary | ICD-10-CM

## 2016-08-09 DIAGNOSIS — I639 Cerebral infarction, unspecified: Secondary | ICD-10-CM | POA: Diagnosis not present

## 2016-08-09 DIAGNOSIS — Z01818 Encounter for other preprocedural examination: Secondary | ICD-10-CM

## 2016-08-09 DIAGNOSIS — J341 Cyst and mucocele of nose and nasal sinus: Secondary | ICD-10-CM | POA: Insufficient documentation

## 2016-08-09 MED ORDER — GADOBENATE DIMEGLUMINE 529 MG/ML IV SOLN
17.0000 mL | Freq: Once | INTRAVENOUS | Status: AC | PRN
  Administered 2016-08-09: 17 mL via INTRAVENOUS

## 2016-08-11 ENCOUNTER — Telehealth: Payer: Self-pay | Admitting: Neurology

## 2016-08-11 DIAGNOSIS — R251 Tremor, unspecified: Secondary | ICD-10-CM

## 2016-08-11 DIAGNOSIS — Z01818 Encounter for other preprocedural examination: Secondary | ICD-10-CM

## 2016-08-11 NOTE — Telephone Encounter (Signed)
Patient made aware. She will contact her insurance company about cost and call back to let me know when to go ahead and schedule.

## 2016-08-11 NOTE — Telephone Encounter (Signed)
-----   Message from Bradford Woods, DO sent at 08/10/2016  1:03 PM EST ----- Her T1 images were fine but the post contrast images were not good enough for me to be able to plan surgery.  Looks like we may have to do this one under anesthesia

## 2016-08-12 NOTE — Addendum Note (Signed)
Addended byAnnamaria Helling on: 08/12/2016 09:45 AM   Modules accepted: Orders

## 2016-08-15 ENCOUNTER — Telehealth: Payer: Self-pay | Admitting: Neurology

## 2016-08-15 DIAGNOSIS — G20 Parkinson's disease: Secondary | ICD-10-CM

## 2016-08-15 DIAGNOSIS — G20A1 Parkinson's disease without dyskinesia, without mention of fluctuations: Secondary | ICD-10-CM

## 2016-08-15 NOTE — Telephone Encounter (Signed)
Patient states that she is falling a lot and needs to know if Dr Tat needs to see her or what please call [phone]

## 2016-08-15 NOTE — Telephone Encounter (Signed)
Any change or alteration in consciousness today?  On any blood thinners?  If any change/alteration in consciousness or any focal/lateralizing sx's today, needs to go to ER and be evaluated since hit head.  If not, send order to PT

## 2016-08-15 NOTE — Telephone Encounter (Signed)
I just saw a PT order come across my desk for home PT for her.  Is she already in home PT?

## 2016-08-15 NOTE — Telephone Encounter (Signed)
Patient had not been referred to home health.

## 2016-08-15 NOTE — Telephone Encounter (Signed)
Spoke with patient. She is on no blood thinners. No LOC, or other symptoms but was instructed if she develops those symptoms to be evaluated in the ER.   She is okay with physical therapy referral and order sent to neuro rehab center. They will call her directly to schedule.

## 2016-08-15 NOTE — Telephone Encounter (Signed)
Patient having more frequent falls. She fell on Friday and hurt her foot. She then fell this morning and hit her head. She has a knot over her right eye. No loss of consciousness. Please advise.

## 2016-09-01 ENCOUNTER — Other Ambulatory Visit: Payer: Self-pay | Admitting: Neurology

## 2016-09-09 ENCOUNTER — Encounter (HOSPITAL_COMMUNITY): Payer: Self-pay

## 2016-09-09 ENCOUNTER — Encounter (HOSPITAL_COMMUNITY)
Admission: RE | Admit: 2016-09-09 | Discharge: 2016-09-09 | Disposition: A | Discharge status: Home / Self Care | Payer: Medicare Other | Source: Ambulatory Visit | Attending: Internal Medicine | Admitting: Internal Medicine

## 2016-09-09 DIAGNOSIS — F329 Major depressive disorder, single episode, unspecified: Secondary | ICD-10-CM | POA: Diagnosis not present

## 2016-09-09 DIAGNOSIS — Z01810 Encounter for preprocedural cardiovascular examination: Secondary | ICD-10-CM | POA: Diagnosis present

## 2016-09-09 DIAGNOSIS — D649 Anemia, unspecified: Secondary | ICD-10-CM | POA: Insufficient documentation

## 2016-09-09 DIAGNOSIS — I10 Essential (primary) hypertension: Secondary | ICD-10-CM | POA: Insufficient documentation

## 2016-09-09 DIAGNOSIS — Z01812 Encounter for preprocedural laboratory examination: Secondary | ICD-10-CM | POA: Insufficient documentation

## 2016-09-09 DIAGNOSIS — E039 Hypothyroidism, unspecified: Secondary | ICD-10-CM | POA: Insufficient documentation

## 2016-09-09 DIAGNOSIS — G20 Parkinson's disease: Secondary | ICD-10-CM | POA: Insufficient documentation

## 2016-09-09 DIAGNOSIS — G4733 Obstructive sleep apnea (adult) (pediatric): Secondary | ICD-10-CM | POA: Diagnosis not present

## 2016-09-09 DIAGNOSIS — Z87891 Personal history of nicotine dependence: Secondary | ICD-10-CM | POA: Diagnosis not present

## 2016-09-09 DIAGNOSIS — Z87440 Personal history of urinary (tract) infections: Secondary | ICD-10-CM | POA: Diagnosis not present

## 2016-09-09 DIAGNOSIS — Z90710 Acquired absence of both cervix and uterus: Secondary | ICD-10-CM | POA: Insufficient documentation

## 2016-09-09 DIAGNOSIS — R000 Tachycardia, unspecified: Secondary | ICD-10-CM | POA: Insufficient documentation

## 2016-09-09 DIAGNOSIS — J45909 Unspecified asthma, uncomplicated: Secondary | ICD-10-CM | POA: Diagnosis not present

## 2016-09-09 HISTORY — DX: Overactive bladder: N32.81

## 2016-09-09 HISTORY — DX: Unspecified asthma, uncomplicated: J45.909

## 2016-09-09 HISTORY — DX: Cerebral infarction, unspecified: I63.9

## 2016-09-09 HISTORY — DX: Anemia, unspecified: D64.9

## 2016-09-09 LAB — COMPREHENSIVE METABOLIC PANEL
ALBUMIN: 4 g/dL (ref 3.5–5.0)
ALK PHOS: 93 U/L (ref 38–126)
AST: 10 U/L — ABNORMAL LOW (ref 15–41)
Anion gap: 9 (ref 5–15)
BILIRUBIN TOTAL: 0.6 mg/dL (ref 0.3–1.2)
BUN: 19 mg/dL (ref 6–20)
CALCIUM: 9.8 mg/dL (ref 8.9–10.3)
CO2: 26 mmol/L (ref 22–32)
CREATININE: 0.74 mg/dL (ref 0.44–1.00)
Chloride: 104 mmol/L (ref 101–111)
GFR calc non Af Amer: >60 mL/min (ref >60)
GLUCOSE: 101 mg/dL — AB (ref 65–99)
Potassium: 3.7 mmol/L (ref 3.5–5.1)
SODIUM: 139 mmol/L (ref 135–145)
TOTAL PROTEIN: 6.3 g/dL — AB (ref 6.5–8.1)

## 2016-09-09 LAB — CBC
HEMATOCRIT: 38.7 % (ref 36.0–46.0)
HEMOGLOBIN: 12.8 g/dL (ref 12.0–15.0)
MCH: 30.5 pg (ref 26.0–34.0)
MCHC: 33.1 g/dL (ref 30.0–36.0)
MCV: 92.1 fL (ref 78.0–100.0)
Platelets: 160 10*3/uL (ref 150–400)
RBC: 4.2 MIL/uL (ref 3.87–5.11)
RDW: 13.8 % (ref 11.5–15.5)
WBC: 8 10*3/uL (ref 4.0–10.5)

## 2016-09-09 NOTE — Pre-Procedure Instructions (Signed)
    Ana Salazar  09/09/2016      [facility] - [place], [place] - [address] [place] [address] Phone: [phone] Fax: [phone]  [id number] - [place],  - [address] [place] [address] Phone: [phone] Fax: [phone]    Your procedure is scheduled on Tuesday, January 9th   Report to [facility] Admitting at 6:00 AM             (posted procedural time 8:00 - 9:00 AM)   Call this number if you have problems the MORNING of procedure  [phone]   Remember:  Do not eat food or drink liquids after midnight Monday.   Take these medicines the morning of surgery with A SIP OF WATER : Sinemet IR, Celexa   Do not wear jewelry, make-up or nail polish.  Do not wear lotions, powders, perfumes, or deoderant.    Do not bring valuables to the hospital.  [facility] is not responsible for any belongings or valuables.  Contacts, dentures or bridgework may not be worn into surgery.  Leave your suitcase in the car.  After surgery it may be brought to your room.  For patients admitted to the hospital, discharge time will be determined by your treatment team.  Patients discharged the day of surgery will not be allowed to drive home.   Please read over the following fact sheets that you were given. Pain Booklet

## 2016-09-09 NOTE — Progress Notes (Addendum)
H & P under Care Everywhere from PCP, dated 08/31/2016 is sufficient for MRI purposes. States she went to see Dr. Wynonia Lawman -- had stress, echo, & heart monitor done, alittle over 9-12 mths ago (back in 2014). Patient stated that yrs ago, she was having, what now, they say were TIA'.s.    Have tried calling his office (2:58pm Friday) without success.  Will send a fax. PCP is Dr. Doren Custard in [place].  LOV 08/2016 Neurologist is Dr. Renaldo Harrison 06/2016 Dr Alcide Clever in [place] handles the OSA.  She was tested back in 2016.  Knows that the settings are at 4. Has just recently finished up Cipro for UTI.  She gets them quite often she stated.  I sent them home with MRI form so they can have it filled out and to bring it back in day of MRI

## 2016-09-12 NOTE — Anesthesia Preprocedure Evaluation (Addendum)
Anesthesia Evaluation  Patient identified by MRN, date of birth, ID band Patient awake    Reviewed: Allergy & Precautions, H&P , NPO status , Patient's Chart, lab work & pertinent test results  Airway Mallampati: III  TM Distance: >3 FB Neck ROM: Full    Dental no notable dental hx. (+) Teeth Intact, Dental Advisory Given   Pulmonary asthma , sleep apnea and Continuous Positive Airway Pressure Ventilation , former smoker,    Pulmonary exam normal breath sounds clear to auscultation       Cardiovascular Exercise Tolerance: Good hypertension,  Rhythm:Regular Rate:Normal     Neuro/Psych Depression  Neuromuscular disease CVA negative psych ROS   GI/Hepatic negative GI ROS, Neg liver ROS,   Endo/Other  Hypothyroidism   Renal/GU negative Renal ROS  negative genitourinary   Musculoskeletal   Abdominal   Peds  Hematology negative hematology ROS (+) anemia ,   Anesthesia Other Findings   Reproductive/Obstetrics negative OB ROS                            Anesthesia Physical Anesthesia Plan  ASA: III  Anesthesia Plan: General   Post-op Pain Management:    Induction: Intravenous  Airway Management Planned: Oral ETT and LMA  Additional Equipment:   Intra-op Plan:   Post-operative Plan: Extubation in OR  Informed Consent: I have reviewed the patients History and Physical, chart, labs and discussed the procedure including the risks, benefits and alternatives for the proposed anesthesia with the patient or authorized representative who has indicated his/her understanding and acceptance.   Dental advisory given  Plan Discussed with: CRNA  Anesthesia Plan Comments:        Anesthesia Quick Evaluation

## 2016-09-12 NOTE — Progress Notes (Signed)
Anesthesia Chart Review: Patient is a 71 year old female scheduled for MRI under anesthesia. I looks like MRI was scheduled by her neurologist Dr. Wells Guiles Tat, last visit 07/21/16. There is an updated office note with exam from 08/31/16 from [facility]) that is within the past 30 days. Exam can be updated by her anesthesiologist on the morning of her procedure.   History includes former smoker (quit 09/27/12), Parkinson's disease, HTN, tachycardia, depression, hypothyroidism, OSA (CPAP), TIA '14, frequent UTI, asthma, dyspnea, anemia, hysterectomy, tonsillectomy, skin cancer excision, parathyroidectomy and right thyroid lobectomy 09/2013.  PCP is Dr. Arnette Norris in [place]. Neurologist is Dr. Carles Collet.  Pulmonologist is Dr. Alcide Clever ([facility]). Cardiologist is Dr. Wynonia Lawman, last visit 03/23/16.     Meds include atropine ophthalmic, Sinemet CR, Sinemet IR, Zyrtec, Celexa, clonazepam, Comtan, Arnuity Ellipta, Lasix, KCl, verapamil.  EKG 09/09/16: Sinus bradycardia at 58 bpm.  Nuclear stress test 11/06/14 (Dr. Wynonia Lawman): Impression: 1. Probable normal Lexiscan Myoview scan with evidence of diaphragmatic attenuation but no evidence of ischemia or infarction. 2. Normal quantitative gated SPECT EF of 69% with normal wall motion and wall thickening. Recommendations: Evaluate for other causes of chest pain. No evidence of myocardial ischemia noted. Control blood pressure.    Echo 10/27/14 (Dr. Wynonia Lawman): Conclusion: 1. Moderate to severe concentric LVH with normal LV systolic function, EF [id number]. 2. Doppler evidence of grade 1 (impaired) diastolic dysfunction. 3. Mild to moderate left atrial enlargement. 4. Mild aortic valve thickening with trace aortic stenosis. AV peak vel 1.94 m/s. AV peak grad 15.0 mmHg, AV mean grad 7.9 mmHg, AV VTI 44.0 cm, AVA (Vmax) 1.76 cm2.     5. Trace mitral and tricuspid regurgitation.  Event monitor 08/19/15-09/17/15 (Dr. Wynonia Lawman): She is in sinus  bradycardia. No tachycardia is noted.   PA Chest/2V left ribs 08/31/16 [facility]; Care Everywhere): Impression: 1. Left basilar atelectasis.  2. No left rib fracture seen.    Labs from 09/09/2016 noted.  If no acute changes then I anticipate that she can proceed as planned.  [facility] Short Stay Center/Anesthesiology Phone [phone] 09/12/2016 3:50 PM

## 2016-09-13 ENCOUNTER — Encounter (HOSPITAL_COMMUNITY)
Admission: RE | Disposition: A | Discharge status: Home / Self Care | Payer: Self-pay | Source: Ambulatory Visit | Attending: Neurology

## 2016-09-13 ENCOUNTER — Ambulatory Visit (HOSPITAL_COMMUNITY)
Admission: RE | Admit: 2016-09-13 | Discharge: 2016-09-13 | Disposition: A | Discharge status: Home / Self Care | Payer: Medicare Other | Source: Ambulatory Visit | Attending: Neurology | Admitting: Neurology

## 2016-09-13 ENCOUNTER — Ambulatory Visit (HOSPITAL_COMMUNITY): Payer: Medicare Other | Admitting: Certified Registered"

## 2016-09-13 ENCOUNTER — Encounter (HOSPITAL_COMMUNITY): Payer: Self-pay | Admitting: Certified Registered"

## 2016-09-13 ENCOUNTER — Telehealth: Payer: Self-pay | Admitting: Neurology

## 2016-09-13 ENCOUNTER — Ambulatory Visit (HOSPITAL_COMMUNITY): Payer: Medicare Other | Admitting: Emergency Medicine

## 2016-09-13 DIAGNOSIS — G20 Parkinson's disease: Secondary | ICD-10-CM | POA: Diagnosis present

## 2016-09-13 DIAGNOSIS — Z01818 Encounter for other preprocedural examination: Secondary | ICD-10-CM

## 2016-09-13 DIAGNOSIS — R251 Tremor, unspecified: Secondary | ICD-10-CM

## 2016-09-13 DIAGNOSIS — I10 Essential (primary) hypertension: Secondary | ICD-10-CM | POA: Insufficient documentation

## 2016-09-13 HISTORY — PX: RADIOLOGY WITH ANESTHESIA: SHX6223

## 2016-09-13 SURGERY — RADIOLOGY WITH ANESTHESIA
Anesthesia: General

## 2016-09-13 MED ORDER — GADOBENATE DIMEGLUMINE 529 MG/ML IV SOLN
20.0000 mL | Freq: Once | INTRAVENOUS | Status: AC
  Administered 2016-09-13: 16 mL via INTRAVENOUS

## 2016-09-13 MED ORDER — FENTANYL CITRATE (PF) 100 MCG/2ML IJ SOLN: 25.0000 ug | INTRAMUSCULAR | Status: DC | PRN

## 2016-09-13 NOTE — Progress Notes (Signed)
Dr Viona GilmoreOla Spurr at bedside-aware SBP 170-180, O2 sat 90-92% on RA. Pt awake & alert.   Will cont to monitor.

## 2016-09-13 NOTE — Addendum Note (Signed)
Addendum  created 09/13/16 2123 by Roderic Palau, MD   Sign clinical note

## 2016-09-13 NOTE — Telephone Encounter (Signed)
MRI looks better.  Has she had appt with Dr. Vertell Limber

## 2016-09-13 NOTE — Telephone Encounter (Signed)
Her appt is January 15th.

## 2016-09-13 NOTE — Transfer of Care (Signed)
Immediate Anesthesia Transfer of Care Note  Patient: Ana Salazar  Procedure(s) Performed: Procedure(s): MRI BRAIN WITH AND WITHOUT (N/A)  Patient Location: PACU  Anesthesia Type:General  Level of Consciousness: awake, alert , oriented and patient cooperative  Airway & Oxygen Therapy: Patient Spontanous Breathing and Patient connected to face mask oxygen  Post-op Assessment: Report given to RN, Post -op Vital signs reviewed and stable and Patient moving all extremities  Post vital signs: Reviewed and stable  Last Vitals:  Vitals:   09/13/16 0656  BP: (!) 183/72  Resp: 20  Temp: 36.8 C    Last Pain: There were no vitals filed for this visit.    Patients Stated Pain Goal: 6 ([id number])  Complications: No apparent anesthesia complications

## 2016-09-13 NOTE — H&P (Signed)
anhp update 

## 2016-09-13 NOTE — Anesthesia Postprocedure Evaluation (Signed)
Anesthesia Post Note  Patient: Ana Salazar  Procedure(s) Performed: Procedure(s) (LRB): MRI BRAIN WITH AND WITHOUT (N/A)  Patient location during evaluation: PACU Anesthesia Type: General Level of consciousness: awake and alert Pain management: pain level controlled Vital Signs Assessment: post-procedure vital signs reviewed and stable Respiratory status: spontaneous breathing, nonlabored ventilation and respiratory function stable Cardiovascular status: blood pressure returned to baseline and stable Postop Assessment: no signs of nausea or vomiting Anesthetic complications: no       Last Vitals:  Vitals:   09/13/16 1118 09/13/16 1204  BP: (!) 189/80 134/69  Pulse: (!) 58 (!) 55  Resp: 16   Temp:      Last Pain:  Vitals:   09/13/16 1118  PainSc: 0-No pain                 Vinay Ertl,W. EDMOND

## 2016-09-13 NOTE — Progress Notes (Signed)
O2 sat 91-93 % on RA. Pt awake & alert, strong cough, using IS up to 1500 cc. SBP 180-188. Dr Jefm Petty fully updated-OK to tx pt to Phase 2 and will continue to monitor O2 sat. Daughter here & fully updated, also.

## 2016-09-14 ENCOUNTER — Encounter (HOSPITAL_COMMUNITY): Payer: Self-pay | Admitting: Radiology

## 2016-09-20 ENCOUNTER — Other Ambulatory Visit: Payer: Self-pay | Admitting: Neurology

## 2016-09-20 ENCOUNTER — Telehealth: Payer: Self-pay | Admitting: Neurology

## 2016-09-20 MED FILL — Lidocaine HCl Local Soln Prefilled Syringe 100 MG/5ML (2%): INTRAMUSCULAR | Qty: 5 | Status: AC

## 2016-09-20 MED FILL — Sugammadex Sodium IV 200 MG/2ML (Base Equivalent): INTRAVENOUS | Qty: 2 | Status: AC

## 2016-09-20 MED FILL — Ephedrine Sulf-NaCl Soln Pref Syr 50 MG/10ML-0.9% (5 MG/ML): INTRAVENOUS | Qty: 10 | Status: AC

## 2016-09-20 MED FILL — Propofol IV Emul 200 MG/20ML (10 MG/ML): INTRAVENOUS | Qty: 20 | Status: AC

## 2016-09-20 MED FILL — Rocuronium Bromide IV Soln 100 MG/10ML (10 MG/ML): INTRAVENOUS | Qty: 4 | Status: AC

## 2016-09-20 NOTE — Telephone Encounter (Signed)
PT called and said she can not afford her prescription for Comtan/Dawn CB# [phone]

## 2016-09-20 NOTE — Telephone Encounter (Signed)
Spoke with patient and she states her monthly payment for comtan has gone up from $37 to $74 a month and she can not afford it. She asked if there is an alternative that can be called in. Made her aware no other drug that works to extend the life of Levodopa and already tried CR Levodopa and did not do as well. Please advise.

## 2016-09-20 NOTE — Telephone Encounter (Signed)
No other options unless stalevo is cheaper than that

## 2016-09-26 NOTE — Telephone Encounter (Signed)
Left message on machine for patient to call back.

## 2016-09-27 MED ORDER — CARBIDOPA-LEVODOPA-ENTACAPONE 25-100-200 MG PO TABS: ORAL_TABLET | ORAL | 0 refills | Status: DC

## 2016-09-27 NOTE — Telephone Encounter (Signed)
Finally spoke with patient and she would like me to call in Stalevo to see how the price compares. Dr. Carles Collet what dosage/directions would you like me to send?

## 2016-09-27 NOTE — Telephone Encounter (Signed)
RX sent to pharmacy. Patient will let us know what she plans on taking.

## 2016-09-27 NOTE — Telephone Encounter (Signed)
stalevo 100.  Will take this in place of the carbidopa/levodopa 25/100, so will take 2/2/2/1 and then can still take the carbidopa/levodopa 50/200 at bed

## 2016-09-30 ENCOUNTER — Telehealth: Payer: Self-pay | Admitting: Neurology

## 2016-09-30 NOTE — Telephone Encounter (Signed)
Encounter opened in error

## 2016-10-05 ENCOUNTER — Other Ambulatory Visit: Payer: Self-pay | Admitting: Neurosurgery

## 2016-10-05 ENCOUNTER — Other Ambulatory Visit (HOSPITAL_COMMUNITY): Payer: Self-pay | Admitting: Neurosurgery

## 2016-10-05 DIAGNOSIS — G20 Parkinson's disease: Secondary | ICD-10-CM

## 2016-10-25 NOTE — Progress Notes (Signed)
u   Ana Salazar was seen today in the movement disorders clinic for neurologic consultation at the request of [name], [name], MD.   The patient presents today for a second opinion regarding Parkinson's disease. She is accompanied by her daughter and sister who supplement the history.   I have reviewed an extensive number of her records from her prior neurologist, [name], and I appreciate those records.  The patient presented to [name] first in November, 2014, but reported that she had had symptoms for about a year and a half prior to that.  Her first symptoms consisted of difficulty moving her feet/shuffling the feet; trouble getting out of the bed; tremor (she thinks that it started in both but does state that the right is worse than the left and always has been).  The patient was started on a trial of levodopa at her first visit with [name] and followed up with him the next month and noted great improvement with the medication.  She was started on pramipexole the following visit but noted that it made her dizzy and it was discontinued over the telephone.  The following visit, she was started on entacapone 200 mg 3 times a day.  In April, 2015 her carbidopa/levodopa 25/100 was increased to 2 tablets 3 times per day.  In July, 2016 she was changed to carbidopa/levodopa 50/200, 3 times per day.  In October, 2016 she was placed on a combination of carbidopa/levodopa 50/200, 3 times a day (4-5am, noon, 8pm) in addition to carbidopa/levodopa 25/100, 2 tablets 3 times per day (1200 mg of levodopa per day).  She remains on the entacapone, 200 mg 3 times per day as well as primidone, 50 mg - 1.5 tablets at night, which she is using for tremor control.  She reports that her biggest frustration is that she is always moving and is more dizzy and is starting to have a few falls.  When she is nearing end of dose (1-2 hours prior) she will have tremor in her stomach, followed by her legs and arms.    10/13/15  update:  The patient is following care today, primarily for levodopa challenge.  She is currently off of medication.  She is accompanied by her daughter (different daughter than last visit) who supplements the history.  She last took her medication at 8pm.  Her medication generally consists of carbidopa/levodopa 50/200 3 times per day in addition to carbidopa/levodopa 25/100, 1 tablet 6 times per day.  Splitting the medication did help somewhat, but she thinks that it is not helping as much as it does initially when she first started doing it that way.  She is also on entacapone 200 mg 3 times per day and primidone 75 mg at night.  She has not had any falls since our last visit.  01/12/16 update:  The patient is following up today, as she called me to ask me about potentially decreasing her levodopa. She is accompanied by her sister who supplements the history.   She is on a very large dosage and somewhat of a strange combination, but came to me on this combination.  She is currently taking carbidopa/levodopa 50/200 3 times a day (5am/noon/8pm) and is now spreading out her carbidopa/levodopa 25/100 to one tablet 6 times per day (5am is the start and she takes that q 3 hrs until 8 pm).  She remains on entacapone 200 mg 3 times a day and primidone 75 mg daily.  She goes to  bed at about 8pm.  She has found that she is having more dyskinesia.  She has been working with rehabilitation therapist and I have gotten a couple of correspondence is from them that they have been concerned about labored breathing.   I advised follow-up with her primary care physician if this was true shortness of breath.  She did say that she wore a holter monitor for a month and that eval was negative.  Was told if continues needs pulm.  Only SOB when she is hot or exerting herself.  He also mentioned that they were concerned about inability to track inferiorly.  She did see [name] for her neuropsych testing, done on 12/22/2015.  She got  feedback from them on 01/05/2016.  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  I specifically spoke with [name], and she did not feel there was evidence of an atypical state from her testing.  She felt that she could potentially be a good DBS candidate.  She did recommend a vision evaluation, medication to help with anxiety/sleep/irritability, and a follow-up regarding a history of sleep apnea.  Pt states that she was already to the eye doctor in December at [facility].    02/02/16 update:  The patient is following up today, earlier than expected.  She is accompanied by her sister who supplements the history.  Last visit I changed her daytime extended release levodopa all over 2 immediate release.  Therefore, she was taking carbidopa/levodopa 25/100 as follows: 2 at 5am (she is waking up to take that pill and goes to bed until 7am/1 at 8am/1 at 11 am//2 at 2 pm/1 at 5pm and then would take an additional levodopa/levodopa 50/200 at 8pm.  This dropped her overall load of levodopa from 1200 mg to 900 mg, primarily because she was complaining about dyskinesia.  She called me on May 22 and stated that she was doing well initially, but now feels more weak and stiff.  When I asked her specifically, she admitted that she was being treated for urinary tract infection.  I told her to give that some time to resolve, because that can worsen the symptoms of Parkinson's.  She called the following day and requested a follow-up appointment here, which is the reason for follow-up.  She states that she is weaker, which means that the legs are "tired."  She denies freezing.  She states that overall she is more tired.  No falls.  She states that the sx's do not wax and wane throughout the day.  Her sister thinks that she is doing better but sister does state that she is "starting to get that shuffle."  Last PT was in mid April.  C/o nose dripping for years.    05/04/16 update:  The patient follows up today,  accompanied by her sister who supplements the history.  Last visit, the patient wanted to decrease her levodopa load, primarily because of dyskinesia.  We ended up reworking it significantly.  I changed her from the CR formulation to the immediate release formulation and did drop the overall load somewhat.  She is currently taking carbidopa/levodopa 25/100, 2 tablets at 7 AM/2 tablet at 10 AM/2 tablets at 1 PM/1 tablet at 4 PM/carbidopa/levodopa 50/200 at bedtime (8PM).  She cut out the 7pm dosage.    She takes entacapone 3 times per day, which turned out to be every other dose of levodopa.  She is still on primidone, but only on 50 mg  daily, which she was on prior to coming to see me.  She initally states that she isn't better but then states that dyskinesia is "a lot better than it was" which was the primary issue for changing.   She has had several falls that she attributes to looking down when she walks or getting dizzy when she first gets up.  Was able to get off of clonidine and losartan per PCP because of dizziness and that helped dizziness.  She hasn't gotten hurt with falls.   The big issue now is trouble sleeping.  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  She states that she was only using one time per day.  She also told me last visit that she was getting ready to have a sleep study for objective sleep apnea syndrome and a pulmonary consult for shortness of breath.  I do not have that information but she tells me that she does have osas and needs cpap titration and is awaiting that.  Denies depression but daughter thinks that she has it.  Pt doesn't have much to do during the day.  06/06/16 update: The patient follows up today, accompanied by her sister and daughter who supplement the history.  She is currently taking carbidopa/levodopa 25/100, 2 tablets at 7 AM/2 tablet at 10 AM/2 tablets at 1 PM/1 tablet at 4  PM/carbidopa/levodopa 50/200 at bedtime (8PM).  She is on entacapone 200 mg 3 times per day.  States that "my legs are really tired." Thinks that this is mostly in middle of the day/early AM and then "I spend the entire day trying to catch up."   Finds that if she takes an extra carbidopa/levodopa 25/100 in the middle of the night she does better.  Last visit, I discontinued her primidone, 50 mg daily.  She was complaining about vasomotor rhinorrhea last visit, and I told her to increase her atropine drops to 3 times a day as needed.  She states that she hasn't do that.  I started remeron last visit for depression and sleep.  Is still on celexa.  Remeron definitely helped sleep but got CPAP since our last visit as well.  Sister states that she is using that and is now screaming and hollaring and hitting the walls at night, but seems this was going on before CPAP.    07/21/16 update:  Pt returns again for levodopa challenge test.  2 of her daughters accompany her and supplement the history.  She is on carbidopa/levodopa 25/100, 2 tablets at 7 AM/2 tablet at 10 AM//2 tablets at 1 PM/1 tablet at 4 PM and then she will decide if she needs her last one tablet at 7 PM.  She also takes carbidopa/levodopa  50/200 at bedtime, which is 8 PM.  She is on comtan 200 mg tid.  She has been off of all PD med for about 60 hrs for this test.  She does feel more slowly and stiff, although she does feel less dizzy getting off of the medication.  10/25/16 update:  Pt returns today for pre-op visit.  She is accompanied by her daughter who supplements the history.  She is scheduled for DBS surgery next month.  She is on carbidopa/levodopa 25/100, 2/2/2/1 and sometimes will take another in middle of the night.  She takes carbidopa/levodopa 50/200 at bedtime.  She takes entacapone with 3 of the daytime dosages of levodopa.  She has had  some falls because not using her walker, often at night per daughter.  She is loving [employer].    Neuroimaging has  previously been performed.  It is not available for my review today.  Pt states that it was done at [facility] (was done prior to going to [name] at [facility]).  No fam hx of PD.    ALLERGIES:   Allergies  Allergen Reactions  . Amlodipine Itching and Other (See Comments)    BURNING  . Amoxicillin Swelling and Rash    Has patient had a PCN reaction causing immediate rash, facial/tongue/throat swelling, SOB or lightheadedness with hypotension: #  #  #  YES  #  #  #  PCN reaction causing SEVERE RASH INVOLVING MUCUS MEMBRANES or SKIN NECROSIS: #  #  #  YES  #  #  #  Has patient had a PCN reaction that required hospitalization  #  #  #  NO  #  #  #  Has patient had a PCN reaction occurring within the last 10 years: #  #  #  YES  #  #  # .   Marland Kitchen Ampicillin Swelling    SWELLING REACTION UNSPECIFIED   . Levothyroxine Other (See Comments)    Tired, shaky, tremors  . Meloxicam Swelling    SWELLING REACTION UNSPECIFIED   . Nebivolol Rash  . Nsaids Nausea And Vomiting  . Prednisone Nausea Only  . Valsartan Itching    CURRENT MEDICATIONS:  Outpatient Encounter Prescriptions as of 10/26/2016  Medication Sig  . atropine 1 % ophthalmic solution Use as directed (Patient taking differently: 1 drop daily. Use as directed in nose For Parkinson's disease)  . carbidopa-levodopa (SINEMET CR) 50-200 MG tablet Take 1 tablet by mouth at bedtime.  . carbidopa-levodopa (SINEMET IR) 25-100 MG tablet 2 tablets at 7 AM/2 tablet at 10 AM//2 tablets at 1 PM/1 tablet at 4 PM and one tablet at 7 PM (Patient taking differently: Take 1-2 tablets by mouth See admin instructions. 2 tablets at 7 AM/2 tablet at 10 AM//2 tablets at 1 PM/1 tablet at 4 PM and one tablet at 7 PM SOMETIMES SKIPS 4PM DOSE IF FEELING OK)  . cetirizine (ZYRTEC) 10 MG tablet Take 10 mg by mouth daily as needed for allergies.   . citalopram (CELEXA) 20 MG tablet Take 20 mg by mouth daily.   . clonazePAM (KLONOPIN) 0.5  MG tablet TAKE 1/2 TABLET BY MOUTH AT BEDTIME  . CRANBERRY PO Take 1 tablet by mouth daily.   Marland Kitchen docusate sodium (COLACE) 100 MG capsule Take 100 mg by mouth daily as needed for mild constipation.   . entacapone (COMTAN) 200 MG tablet TAKE 1 TABLET THREE TIMES PER DAY  . Fluticasone Furoate (ARNUITY ELLIPTA) 100 MCG/ACT AEPB Inhale 1 puff into the lungs daily.   . furosemide (LASIX) 20 MG tablet TAKE 1/2 TABLET DAILY AS NEEDED FOR SIGNIFICANT FLUID SWELLING ONLY  . potassium chloride (K-DUR,KLOR-CON) 10 MEQ tablet Take 10 mEq by mouth daily.  . verapamil (CALAN) 80 MG tablet Take 80 mg by mouth 3 (three) times daily.  . [DISCONTINUED] carbidopa-levodopa-entacapone (STALEVO 100) 25-100-200 MG tablet 2 tablets three times daily and 1 in the evening  . [DISCONTINUED] diazepam (VALIUM) 5 MG tablet Take one tablet 30 minutes prior to MR (on arrival to facility) and one more just prior to MR (if needed) (Patient not taking: Reported on 09/01/2016)   No facility-administered encounter medications on file as of  10/26/2016.     PAST MEDICAL HISTORY:   Past Medical History:  Diagnosis Date  . Anemia   . Asthma   . Depression   . Dyspnea   . Hypertension   . Hypothyroidism   . Kidney disease    has frequent UTS's.  just finishing up antibiotics now.  . Neuromuscular disorder ([place])    parkinson's disease    dx 4 yr ago  . Overactive bladder   . Parkinson's disease ([place])   . Seasonal allergies   . Sleep apnea    tested in [place], 5-6 mths ago.  wears cpap. "sitting on 5"  . Stroke [facility])    "a bunch"   no deficits,  "cleared up".  Last one 4 yr ago  . Tachycardia     PAST SURGICAL HISTORY:   Past Surgical History:  Procedure Laterality Date  . ABDOMINAL HYSTERECTOMY    . CESAREAN SECTION    . fibroid tumor    . RADIOLOGY WITH ANESTHESIA N/A 09/13/2016   Procedure: MRI BRAIN WITH AND WITHOUT;  Surgeon: Medication Radiologist, MD;  Location: [facility];  Service: Radiology;  Laterality: N/A;  .  skin cancer removed left arm    . THYROIDECTOMY, PARTIAL    . TONSILLECTOMY      SOCIAL HISTORY:   Social History   Social History  . Marital status: Widowed    Spouse name: N/A  . Number of children: N/A  . Years of education: N/A   Occupational History  . retired    Social History Main Topics  . Smoking status: Former Smoker    Quit date: 09/27/2012  . Smokeless tobacco: Never Used  . Alcohol use No  . Drug use: No  . Sexual activity: Not on file   Other Topics Concern  . Not on file   Social History Narrative  . No narrative on file    FAMILY HISTORY:   Family Status  Relation Status  . Mother Deceased   heart disease, DM, complications of fall  . Father Deceased   MI  . Sister Deceased   DM, CAD, renal failure  . Sister Alive   healthy  . Child Alive   healthy    ROS:  A complete 10 system review of systems was obtained and was unremarkable apart from what is mentioned above.  PHYSICAL EXAMINATION:    VITALS:   Vitals:   10/26/16 1009  BP: 110/70  Pulse: (!) 52  Weight: 171 lb (77.6 kg)  Height: 5\' 5"  (1.651 m)    GEN:  The patient appears stated age and is in NAD. HEENT:  Normocephalic, atraumatic.  The mucous membranes are moist. The superficial temporal arteries are without ropiness or tenderness. CV:  Bradycardic.  Regular. Lungs:  CTAB.  There is dyspnea on exertion. Neck/HEME:  There are no carotid bruits bilaterally.  Neurological examination:  Orientation: The patient is alert and oriented x3. Fund of knowledge is appropriate.   Cranial nerves: There is good facial symmetry.  There is mild facial hypomimia.   The speech is fluent and mildly dysarthric.  She has minimal trouble with the guttural sounds.  No issues with EOM movement.  Soft palate rises symmetrically and there is no tongue deviation. Hearing is intact to conversational tone. Sensation: Sensation is intact to light touch throughout.  Movement examination: Tone: There is  no rigidity.   Abnormal movements:  She has truncal dyskinesia and left leg dyskinesia Coordination: There is decremation with RAM's, seen  with toe taps and heel taps bilaterally, right more than left. She has mild difficulty with rapid pronating movements in the upper extremities, again right greater than left. Gait and Station: The patient has no difficulty arising out of a deep-seated chair without the use of the hands. The patient's stride length is normal with normal arm swing.   The patient has a negative pull test.     ASSESSMENT/PLAN:  1.  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  -The patient is continuing to experience motor fluctuations including dyskinesia and on/off.    -She is doing better with the IR formulation than the CR and will continue taking carbidopa/levodopa 25/100, 2 tablets at 7 AM/2 tablet at 10 AM//2 tablets at 1 PM/1 tablet at 4 PM and then she occasionally takes one in middle of night; she takes carbidopa/levodopa  50/200 at bedtime, which is 8 PM.    -For now will remain on the entacapone 3 times per day.  -She is doing rock study boxing and I congratulated her on that.  -I talked to the patient and her daughters about the logistics associated with DBS therapy.  I talked to the patient about risks/benefits/side effects of DBS therapy.  We talked about risks which included but were not limited to infection, paralysis, intraoperative seizure, death, stroke, bleeding around the electrode.   I talked to patient about fiducial placement 1 week prior to DBS therapy.  I talked to the patient about what to expect in the operating room, including the fact that this is an awake surgery.  We talked about battery placement as well as which is done under general anesthesia, generally approximately one week following the initial surgery.  We also talked about the fact that a new device maker has come out since her last  visit.  I talked to her about the differences between the [employer] device and the [employer] device.  I talked to her about rechargeable versus nonrechargeable options.  I talked to her about the fact that the [employer] device is currently not MRI approved, which could be limiting in the future.  However, battery life appears to be somewhat longer even in the rechargeable devices.  Pt and daughter both wanted the [employer] device  -we will initiate DBS on L side of brain as sx's started on R side of body  -ask PCP about verapamil as pt c/o BP decreasing   -weaning schedule given for meds pre-op   2.  RBD  -Continue klonopin - 0.5 mg - 1/2 po qhs  Risks, benefits, side effects and alternative therapies were discussed.  The opportunity to ask questions was given and they were answered to the best of my ability.  The patient expressed understanding and willingness to follow the outlined treatment protocols.  -hold remeron since that was primarily for sleep (it did work if need to come back to that)  -get bed rails  3.  Vasomotor rhinitis  -Has atropine ophthalmic.  She needs to try and increase to tid.    4.  OSAS and SOB  -just got started with CPAP and may need chin strap.  Told her to discuss with home company as she is still having difficulty tolerating this.  5.  Much greater than 50% of this visit was spent in counseling with the patient and the family.  Total face to face time:  40 min

## 2016-10-26 ENCOUNTER — Ambulatory Visit (INDEPENDENT_AMBULATORY_CARE_PROVIDER_SITE_OTHER): Payer: Medicare Other | Admitting: Neurology

## 2016-10-26 ENCOUNTER — Encounter: Payer: Self-pay | Admitting: Neurology

## 2016-10-26 VITALS — BP 110/70 | HR 52 | Ht 65.0 in | Wt 171.0 lb

## 2016-10-26 DIAGNOSIS — G20 Parkinson's disease: Secondary | ICD-10-CM

## 2016-10-26 DIAGNOSIS — G4752 REM sleep behavior disorder: Secondary | ICD-10-CM | POA: Diagnosis not present

## 2016-10-26 DIAGNOSIS — G249 Dystonia, unspecified: Secondary | ICD-10-CM | POA: Diagnosis not present

## 2016-10-26 NOTE — Patient Instructions (Addendum)
1. Do not take Clonazepam on 11/28/2016.  2. Stop Levodopa and Comtan starting 11/25/2016.

## 2016-11-22 ENCOUNTER — Encounter (HOSPITAL_COMMUNITY)
Admission: RE | Disposition: A | Discharge status: Home / Self Care | Payer: Self-pay | Source: Ambulatory Visit | Attending: Neurosurgery

## 2016-11-22 ENCOUNTER — Ambulatory Visit (HOSPITAL_COMMUNITY)
Admission: RE | Admit: 2016-11-22 | Discharge: 2016-11-22 | Disposition: A | Discharge status: Home / Self Care | Payer: Medicare Other | Source: Ambulatory Visit | Attending: Neurosurgery | Admitting: Neurosurgery

## 2016-11-22 DIAGNOSIS — Z9689 Presence of other specified functional implants: Secondary | ICD-10-CM

## 2016-11-22 DIAGNOSIS — I10 Essential (primary) hypertension: Secondary | ICD-10-CM | POA: Diagnosis not present

## 2016-11-22 DIAGNOSIS — R000 Tachycardia, unspecified: Secondary | ICD-10-CM | POA: Insufficient documentation

## 2016-11-22 DIAGNOSIS — Z90710 Acquired absence of both cervix and uterus: Secondary | ICD-10-CM | POA: Diagnosis not present

## 2016-11-22 DIAGNOSIS — Z880 Allergy status to penicillin: Secondary | ICD-10-CM | POA: Diagnosis not present

## 2016-11-22 DIAGNOSIS — Z888 Allergy status to other drugs, medicaments and biological substances status: Secondary | ICD-10-CM | POA: Insufficient documentation

## 2016-11-22 DIAGNOSIS — J449 Chronic obstructive pulmonary disease, unspecified: Secondary | ICD-10-CM | POA: Diagnosis not present

## 2016-11-22 DIAGNOSIS — Z9889 Other specified postprocedural states: Secondary | ICD-10-CM

## 2016-11-22 DIAGNOSIS — Z85028 Personal history of other malignant neoplasm of stomach: Secondary | ICD-10-CM | POA: Insufficient documentation

## 2016-11-22 DIAGNOSIS — N289 Disorder of kidney and ureter, unspecified: Secondary | ICD-10-CM | POA: Diagnosis not present

## 2016-11-22 DIAGNOSIS — Z79899 Other long term (current) drug therapy: Secondary | ICD-10-CM | POA: Diagnosis not present

## 2016-11-22 DIAGNOSIS — J45909 Unspecified asthma, uncomplicated: Secondary | ICD-10-CM | POA: Diagnosis not present

## 2016-11-22 DIAGNOSIS — E890 Postprocedural hypothyroidism: Secondary | ICD-10-CM | POA: Diagnosis not present

## 2016-11-22 DIAGNOSIS — G20 Parkinson's disease: Secondary | ICD-10-CM | POA: Diagnosis present

## 2016-11-22 HISTORY — PX: MINOR PLACEMENT OF FIDUCIAL: SHX6748

## 2016-11-22 SURGERY — MINOR PLACEMENT OF FIDUCIAL
Anesthesia: Choice

## 2016-11-22 MED ORDER — SODIUM BICARBONATE 4 % IV SOLN
INTRAVENOUS | Status: AC
  Filled 2016-11-22: qty 5

## 2016-11-22 MED ORDER — LIDOCAINE-EPINEPHRINE 2 %-1:100000 IJ SOLN
INTRAMUSCULAR | Status: AC
  Filled 2016-11-22: qty 1

## 2016-11-22 MED ORDER — BACITRACIN ZINC 500 UNIT/GM EX OINT
TOPICAL_OINTMENT | CUTANEOUS | Status: AC
  Filled 2016-11-22: qty 28.35

## 2016-11-22 SURGICAL SUPPLY — 16 items
BANDAGE ADH SHEER 1  50/CT (GAUZE/BANDAGES/DRESSINGS) ×10 IMPLANT
BLADE CLIPPER SURG (BLADE) ×2 IMPLANT
BLADE SURG 15 STRL LF DISP TIS (BLADE) ×1 IMPLANT
BLADE SURG 15 STRL SS (BLADE) ×1
COVER BACK TABLE 60X90IN (DRAPES) ×2 IMPLANT
DRAPE HALF SHEET 40X57 (DRAPES) ×2 IMPLANT
DRAPE SHEET LG 3/4 BI-LAMINATE (DRAPES) ×2 IMPLANT
GLOVE BIO SURGEON STRL SZ8 (GLOVE) ×2 IMPLANT
GLOVE ECLIPSE 8.5 STRL (GLOVE) ×2 IMPLANT
NEEDLE HYPO 18GX1.5 BLUNT FILL (NEEDLE) ×2 IMPLANT
NEEDLE HYPO 25X1 1.5 SAFETY (NEEDLE) ×2 IMPLANT
SOLUTION BETADINE 4OZ (MISCELLANEOUS) ×2 IMPLANT
SPONGE GAUZE 4X4 12PLY STER LF (GAUZE/BANDAGES/DRESSINGS) ×2 IMPLANT
STAPLER SKIN PROX WIDE 3.9 (STAPLE) ×2 IMPLANT
SYR CONTROL 10ML LL (SYRINGE) ×2 IMPLANT
TOWEL GREEN STERILE (TOWEL DISPOSABLE) ×2 IMPLANT

## 2016-11-22 NOTE — Progress Notes (Addendum)
Verdis Prime, RN and Dr. Vertell Limber performed procedure. Instructions and discharge by Verdis Prime.

## 2016-11-22 NOTE — Op Note (Signed)
Indications:  Patient has Parkinson's Disease and presents for Star Fix Fiducial Placement for upcoming Bilateral DBS STN placement.    Procedure:  Patient was brought to the preoperative room.  Her scalp was been shaved.  Areas of planned fiducial placement were marked, scalp was prepped with betadine.  Scalp was infiltrated with lidocaine with epinephrine.  Four fiducials were placed according to standard landmarks through stab incisions.  4-0 Nylon sutures were placed and sterile dressings were applied.  Patient tolerated procedure well.  She was taken for a CT scan and then discharged home having tolerated the procedure well.

## 2016-11-22 NOTE — H&P (Signed)
Patient ID:   [id number] Patient: Ana Salazar  Date of Birth: Aug 29, 1946 Visit Type: Office Visit   Date: 09/19/2016 03:30 PM Provider: Marchia Meiers. Vertell Limber MD   This 71 year old female presents for Parkinson's Disease.  History of Present Illness: 1.  Parkinson's Disease    Ana Salazar, 71 year old retired female, visits to discuss deep brain stimulation for her Parkinson's.  She has discussed this option with Dr. Carles Collet her neurologist.  History: HTN, asthma, hypothyroidism, kidney disease, tachycardia, Parkinson's Surgical history: Fibroid tumor 1998, C-section 1976; left arm sebaceous tumor 2016; thyroid surgery 2014; tonsillectomy and hysterectomy years ago  Dr. Wynonia Lawman is her cardiologist  MRI [facility]        PAST MEDICAL/SURGICAL HISTORY   (Detailed)  Disease/disorder Onset Date Management Date Comments    Thyroidectomy      fibroid tumor  RRM 09/19/2016 -    arm tumor  RRM 09/19/2016 -    c-section  RRM 09/19/2016 -  Cancer, stomach    RRM 09/19/2016 -  COPD      Hypertension      Thyroid disease         Family History  (Detailed)   SOCIAL HISTORY  (Detailed) Tobacco use reviewed. Preferred language is Unknown.   Smoking status: Never smoker.  SMOKING STATUS Use Status Type Smoking Status Usage Per Day Years Used Total Pack Years  no/never  Never smoker             MEDICATIONS(added, continued or stopped this visit): Started Medication Directions Instruction Stopped   citalopram 20 mg tablet take 1 tablet by oral route  every day     clonazepam 0.5 mg tablet take 1 tablet by oral route  every day     entacapone 200 mg tablet take 1 tablet by oral route 3 times every day in combination with carbidopa and levodopa     potassium chloride ER 10 mEq capsule,extended release take 4 capsule by oral route 2 times every day with food     Sinemet CR 25 mg-100 mg tablet,extended release take 1 tablet by oral route 2 times every day     Sinemet CR 50 mg-200 mg  tablet,extended release take 1 tablet by oral route  every day     verapamil 80 mg tablet take 1 tablet by oral route  every day       ALLERGIES: Ingredient Reaction Medication Name Comment  AMLODIPINE BESYLATE  Norvasc   NSAIDS (NON-STEROIDAL ANTI-INFLAMMATORY DRUG)     VALSARTAN     MELOXICAM     NEBIVOLOL     LEVOTHYROXINE     AMPICILLIN     NEBIVOLOL HCL  Bystolic    Reviewed, updated.    Vitals Date Temp F BP Pulse Ht In Wt Lb BMI BSA Pain Score  09/19/2016  140/82 94 65 180 29.95  0/10     PHYSICAL EXAM General Level of Distress: no acute distress Overall Appearance: normal    Cardiovascular Cardiac: regular rate and rhythm without murmur  Right Left  Carotid Pulses: normal normal  Respiratory Lungs: clear to auscultation  Neurological Orientation: normal Recent and Remote Memory: normal Attention Span and Concentration:   normal Language: normal Fund of Knowledge: normal  Right Left Sensation: normal normal Upper Extremity Coordination: normal normal  Lower Extremity Coordination: normal normal  Musculoskeletal Gait and Station: normal  Right Left Upper Extremity Muscle Strength: normal normal Lower Extremity Muscle Strength: normal normal Upper Extremity Muscle Tone:  normal normal Lower  Extremity Muscle Tone: normal normal  Motor Strength Upper and lower extremity motor strength was tested in the clinically pertinent muscles.     Deep Tendon Reflexes  Right Left Biceps: normal normal Triceps: normal normal Brachioradialis: normal normal Patellar: normal normal Achilles: normal normal  Sensory Sensation was tested at C2 to T1.   Cranial Nerves II. Optic Nerve/Visual Fields: normal III. Oculomotor: normal IV. Trochlear: normal V. Trigeminal: normal VI. Abducens: normal VII. Facial: normal VIII. Acoustic/Vestibular: normal IX. Glossopharyngeal: normal X. Vagus: normal XI. Spinal Accessory: normal XII.  Hypoglossal: normal  Motor and other Tests Lhermittes: negative Rhomberg: negative Pronator drift: absent     Right Left Spurlings negative negative Hoffman's: normal normal Clonus: normal normal Babinski: normal normal SLR: negative negative Patrick's Corky Sox): negative negative Toe Walk: normal normal Toe Lift: normal normal Heel Walk: normal normal Tinels Elbow: negative negative Tinels Wrist: negative negative Phalen: negative negative   Additional Findings:  Symmetric reflexes, no pronator drift.    IMPRESSION The patient comes in expressing desire for relief for her dyskinesias and other Parkinson's symptoms.  We discussed DBS surgery and IPG placement and she would like to pursue it  She is not on any anti-platelet agents  or blood thinners.  On confrontational testing, she has symmetric reflexes and no pronator drift.  She has mild dyskinesias of both upper extremities on exam today.  She needs to be cleared by Dr. Wynonia Lawman before surgery.  Completed Orders (this encounter) Order Details Reason Side Interpretation Result Initial Treatment Date Region  Lifestyle education regarding diet Encouraged patient to eat well balanced diet.        Lifestyle education Patient will follow up with Primary care physician.         Assessment/Plan # Detail Type Description   1. Assessment Body mass index (BMI) 29.0-29.9, adult (Y10.17).   Plan Orders Today's instructions / counseling include(s) Lifestyle education regarding diet.       2. Assessment Essential (primary) hypertension (I10).         Pain Assessment/Treatment Pain Scale: 0/10. Method: Numeric Pain Intensity Scale. Onset: 09/20/2011.  Gain clearance from Dr. Wynonia Lawman.  Schedule bilateral STN DBS surgery and IPG placement.  Nurse Education Given.  Given booklet and DVD on DBS treatment for Parkinson's Disease.  Orders: Instruction(s)/Education: Assessment Instruction  I10 Lifestyle education  [phone] Lifestyle  education regarding diet             Provider:  Marchia Meiers. Vertell Limber MD  09/19/2016 04:56 PM Dictation edited by: Daine Gravel    CC Providers: Alonza Bogus [address] [place], [place] [address]              Electronically signed by Marchia Meiers. Vertell Limber MD on 09/21/2016 09:58 AM

## 2016-11-22 NOTE — Interval H&P Note (Signed)
History and Physical Interval Note:  11/22/2016 8:31 AM  Ana Salazar  has presented today for surgery, with the diagnosis of Parkinson Disease  The various methods of treatment have been discussed with the patient and family. After consideration of risks, benefits and other options for treatment, the patient has consented to  Procedure(s) with comments: Fiducial placement (N/A) - Fiducial placement as a surgical intervention .  The patient's history has been reviewed, patient examined, no change in status, stable for surgery.  I have reviewed the patient's chart and labs.  Questions were answered to the patient's satisfaction.     [name] D

## 2016-11-23 ENCOUNTER — Encounter (HOSPITAL_COMMUNITY): Payer: Self-pay | Admitting: Neurosurgery

## 2016-11-28 ENCOUNTER — Encounter (HOSPITAL_COMMUNITY): Payer: Self-pay | Admitting: *Deleted

## 2016-11-28 NOTE — Progress Notes (Signed)
Pt denies SOB and chest pain but is under the care of Dr. Wynonia Lawman, Cardiology. Pt denies having a cardiac cath. Pt stated that a chest x ray was performed at Dr. Jim Like office; records requested. Pt made aware to stop taking Aspirin, vitamins, fish oil, Cranberry and herbal medications. Do not take any NSAIDs ie: Ibuprofen, Advil, Naproxen, BC and Goody Powder or any medication containing Aspirin. Pt verbalized understanding of all pre-op instructions.

## 2016-11-29 ENCOUNTER — Encounter (HOSPITAL_COMMUNITY)
Admission: RE | Disposition: A | Discharge status: Home / Self Care | Payer: Self-pay | Source: Ambulatory Visit | Attending: Neurosurgery

## 2016-11-29 ENCOUNTER — Inpatient Hospital Stay (HOSPITAL_COMMUNITY): Payer: Medicare Other | Admitting: Certified Registered Nurse Anesthetist

## 2016-11-29 ENCOUNTER — Inpatient Hospital Stay (HOSPITAL_COMMUNITY)
Admission: RE | Admit: 2016-11-29 | Discharge: 2016-11-30 | DRG: 027 | Disposition: A | Discharge status: Home / Self Care | Payer: Medicare Other | Source: Ambulatory Visit | Attending: Neurosurgery | Admitting: Neurosurgery

## 2016-11-29 ENCOUNTER — Inpatient Hospital Stay (HOSPITAL_COMMUNITY): Payer: Medicare Other

## 2016-11-29 ENCOUNTER — Encounter (HOSPITAL_COMMUNITY): Payer: Self-pay | Admitting: *Deleted

## 2016-11-29 ENCOUNTER — Other Ambulatory Visit: Payer: Self-pay | Admitting: Neurology

## 2016-11-29 DIAGNOSIS — J449 Chronic obstructive pulmonary disease, unspecified: Secondary | ICD-10-CM | POA: Diagnosis present

## 2016-11-29 DIAGNOSIS — G20 Parkinson's disease: Principal | ICD-10-CM | POA: Diagnosis present

## 2016-11-29 DIAGNOSIS — Z880 Allergy status to penicillin: Secondary | ICD-10-CM | POA: Diagnosis not present

## 2016-11-29 DIAGNOSIS — Z79899 Other long term (current) drug therapy: Secondary | ICD-10-CM | POA: Diagnosis not present

## 2016-11-29 DIAGNOSIS — D649 Anemia, unspecified: Secondary | ICD-10-CM | POA: Diagnosis present

## 2016-11-29 DIAGNOSIS — Z888 Allergy status to other drugs, medicaments and biological substances status: Secondary | ICD-10-CM

## 2016-11-29 DIAGNOSIS — G4730 Sleep apnea, unspecified: Secondary | ICD-10-CM | POA: Diagnosis present

## 2016-11-29 DIAGNOSIS — I110 Hypertensive heart disease with heart failure: Secondary | ICD-10-CM | POA: Diagnosis present

## 2016-11-29 DIAGNOSIS — I509 Heart failure, unspecified: Secondary | ICD-10-CM | POA: Diagnosis present

## 2016-11-29 DIAGNOSIS — E039 Hypothyroidism, unspecified: Secondary | ICD-10-CM | POA: Diagnosis present

## 2016-11-29 DIAGNOSIS — Z87891 Personal history of nicotine dependence: Secondary | ICD-10-CM

## 2016-11-29 HISTORY — DX: Heart failure, unspecified: I50.9

## 2016-11-29 HISTORY — DX: Pneumonia, unspecified organism: J18.9

## 2016-11-29 HISTORY — PX: SUBTHALAMIC STIMULATOR INSERTION: SHX5375

## 2016-11-29 LAB — COMPREHENSIVE METABOLIC PANEL
ALK PHOS: 85 U/L (ref 38–126)
ALT: 9 U/L — ABNORMAL LOW (ref 14–54)
ANION GAP: 10 (ref 5–15)
AST: 13 U/L — ABNORMAL LOW (ref 15–41)
Albumin: 3.9 g/dL (ref 3.5–5.0)
BILIRUBIN TOTAL: 0.9 mg/dL (ref 0.3–1.2)
BUN: 22 mg/dL — ABNORMAL HIGH (ref 6–20)
CALCIUM: 9.7 mg/dL (ref 8.9–10.3)
CO2: 29 mmol/L (ref 22–32)
Chloride: 104 mmol/L (ref 101–111)
Creatinine, Ser: 0.81 mg/dL (ref 0.44–1.00)
GFR calc non Af Amer: >60 mL/min (ref >60)
GLUCOSE: 108 mg/dL — AB (ref 65–99)
Potassium: 3.4 mmol/L — ABNORMAL LOW (ref 3.5–5.1)
Sodium: 143 mmol/L (ref 135–145)
Total Protein: 6.4 g/dL — ABNORMAL LOW (ref 6.5–8.1)

## 2016-11-29 LAB — CBC
HEMATOCRIT: 40.7 % (ref 36.0–46.0)
HEMOGLOBIN: 13.3 g/dL (ref 12.0–15.0)
MCH: 30.6 pg (ref 26.0–34.0)
MCHC: 32.7 g/dL (ref 30.0–36.0)
MCV: 93.8 fL (ref 78.0–100.0)
Platelets: 161 10*3/uL (ref 150–400)
RBC: 4.34 MIL/uL (ref 3.87–5.11)
RDW: 14.5 % (ref 11.5–15.5)
WBC: 7.3 10*3/uL (ref 4.0–10.5)

## 2016-11-29 SURGERY — SUBTHALAMIC STIMULATOR INSERTION
Anesthesia: Monitor Anesthesia Care | Site: Head | Laterality: Bilateral

## 2016-11-29 MED ORDER — THROMBIN 5000 UNITS EX SOLR
CUTANEOUS | Status: AC
  Filled 2016-11-29: qty 15000

## 2016-11-29 MED ORDER — POLYVINYL ALCOHOL 1.4 % OP SOLN
1.0000 [drp] | Freq: Two times a day (BID) | OPHTHALMIC | Status: DC | PRN
  Filled 2016-11-29: qty 15

## 2016-11-29 MED ORDER — 0.9 % SODIUM CHLORIDE (POUR BTL) OPTIME
TOPICAL | Status: DC | PRN
  Administered 2016-11-29: 1000 mL

## 2016-11-29 MED ORDER — PROPOFOL 10 MG/ML IV BOLUS
INTRAVENOUS | Status: DC | PRN
  Administered 2016-11-29: 30 mg via INTRAVENOUS

## 2016-11-29 MED ORDER — BUPIVACAINE HCL (PF) 0.5 % IJ SOLN
INTRAMUSCULAR | Status: AC
  Filled 2016-11-29: qty 90

## 2016-11-29 MED ORDER — FENTANYL CITRATE (PF) 100 MCG/2ML IJ SOLN: 25.0000 ug | INTRAMUSCULAR | Status: DC | PRN

## 2016-11-29 MED ORDER — PANTOPRAZOLE SODIUM 40 MG PO TBEC
40.0000 mg | DELAYED_RELEASE_TABLET | Freq: Every day | ORAL | Status: DC
  Administered 2016-11-29: 40 mg via ORAL
  Filled 2016-11-29: qty 1

## 2016-11-29 MED ORDER — PANTOPRAZOLE SODIUM 40 MG IV SOLR: 40.0000 mg | Freq: Every day | INTRAVENOUS | Status: DC

## 2016-11-29 MED ORDER — THROMBIN 5000 UNITS EX SOLR
OROMUCOSAL | Status: DC | PRN
  Administered 2016-11-29: 09:00:00 via TOPICAL

## 2016-11-29 MED ORDER — HYDROCODONE-ACETAMINOPHEN 5-325 MG PO TABS
1.0000 | ORAL_TABLET | ORAL | Status: DC | PRN
  Administered 2016-11-29 – 2016-11-30 (×3): 1 via ORAL
  Filled 2016-11-29 (×3): qty 1

## 2016-11-29 MED ORDER — SODIUM CHLORIDE 0.9% FLUSH
3.0000 mL | Freq: Two times a day (BID) | INTRAVENOUS | Status: DC
  Administered 2016-11-29: 3 mL via INTRAVENOUS

## 2016-11-29 MED ORDER — CLONAZEPAM 0.5 MG PO TABS
0.2500 mg | ORAL_TABLET | Freq: Every day | ORAL | Status: DC
  Administered 2016-11-29: 0.25 mg via ORAL
  Filled 2016-11-29: qty 1

## 2016-11-29 MED ORDER — HYDRALAZINE HCL 20 MG/ML IJ SOLN
INTRAMUSCULAR | Status: DC | PRN
  Administered 2016-11-29 (×3): 2.5 mg via INTRAVENOUS

## 2016-11-29 MED ORDER — SODIUM BICARBONATE 4 % IV SOLN
INTRAVENOUS | Status: AC
  Filled 2016-11-29: qty 15

## 2016-11-29 MED ORDER — PROPOFOL 500 MG/50ML IV EMUL
INTRAVENOUS | Status: DC | PRN
  Administered 2016-11-29: 50 ug/kg/min via INTRAVENOUS

## 2016-11-29 MED ORDER — VERAPAMIL HCL 80 MG PO TABS
80.0000 mg | ORAL_TABLET | Freq: Three times a day (TID) | ORAL | Status: DC
  Administered 2016-11-29 – 2016-11-30 (×3): 80 mg via ORAL
  Filled 2016-11-29 (×4): qty 1

## 2016-11-29 MED ORDER — CHLORHEXIDINE GLUCONATE CLOTH 2 % EX PADS: 6.0000 | MEDICATED_PAD | Freq: Once | CUTANEOUS | Status: DC

## 2016-11-29 MED ORDER — POTASSIUM CHLORIDE CRYS ER 10 MEQ PO TBCR
10.0000 meq | EXTENDED_RELEASE_TABLET | Freq: Every day | ORAL | Status: DC
  Administered 2016-11-29: 10 meq via ORAL
  Filled 2016-11-29: qty 1

## 2016-11-29 MED ORDER — ACETAMINOPHEN 500 MG PO TABS
1000.0000 mg | ORAL_TABLET | Freq: Four times a day (QID) | ORAL | Status: DC | PRN
  Administered 2016-11-29: 1000 mg via ORAL
  Filled 2016-11-29: qty 2

## 2016-11-29 MED ORDER — METHOCARBAMOL 500 MG PO TABS
500.0000 mg | ORAL_TABLET | Freq: Four times a day (QID) | ORAL | Status: DC | PRN
  Administered 2016-11-29: 500 mg via ORAL
  Filled 2016-11-29: qty 1

## 2016-11-29 MED ORDER — ACETAMINOPHEN 325 MG PO TABS: 650.0000 mg | ORAL_TABLET | ORAL | Status: DC | PRN

## 2016-11-29 MED ORDER — SODIUM CHLORIDE 0.9% FLUSH: 3.0000 mL | INTRAVENOUS | Status: DC | PRN

## 2016-11-29 MED ORDER — CITALOPRAM HYDROBROMIDE 20 MG PO TABS
20.0000 mg | ORAL_TABLET | Freq: Every day | ORAL | Status: DC
  Administered 2016-11-29 – 2016-11-30 (×2): 20 mg via ORAL
  Filled 2016-11-29 (×2): qty 1

## 2016-11-29 MED ORDER — DOCUSATE SODIUM 100 MG PO CAPS
100.0000 mg | ORAL_CAPSULE | Freq: Two times a day (BID) | ORAL | Status: DC
  Administered 2016-11-29: 100 mg via ORAL
  Filled 2016-11-29: qty 1

## 2016-11-29 MED ORDER — BUPIVACAINE HCL 0.5 % IJ SOLN
INTRAMUSCULAR | Status: DC | PRN
  Administered 2016-11-29: 22.5 mL

## 2016-11-29 MED ORDER — PROPOFOL 10 MG/ML IV BOLUS
INTRAVENOUS | Status: AC
  Filled 2016-11-29: qty 40

## 2016-11-29 MED ORDER — LIDOCAINE-EPINEPHRINE 2 %-1:100000 IJ SOLN
INTRAMUSCULAR | Status: AC
  Filled 2016-11-29: qty 3

## 2016-11-29 MED ORDER — CARBIDOPA-LEVODOPA ER 50-200 MG PO TBCR
1.0000 | EXTENDED_RELEASE_TABLET | Freq: Every day | ORAL | Status: DC
  Administered 2016-11-29: 1 via ORAL
  Filled 2016-11-29: qty 1

## 2016-11-29 MED ORDER — ACETAMINOPHEN 650 MG RE SUPP: 650.0000 mg | RECTAL | Status: DC | PRN

## 2016-11-29 MED ORDER — CARBIDOPA-LEVODOPA 25-100 MG PO TABS
2.0000 | ORAL_TABLET | ORAL | Status: DC
  Administered 2016-11-29: 2 via ORAL
  Filled 2016-11-29 (×4): qty 2

## 2016-11-29 MED ORDER — LORATADINE 10 MG PO TABS
10.0000 mg | ORAL_TABLET | Freq: Every day | ORAL | Status: DC
  Administered 2016-11-30: 10 mg via ORAL
  Filled 2016-11-29: qty 1

## 2016-11-29 MED ORDER — BACITRACIN ZINC 500 UNIT/GM EX OINT
TOPICAL_OINTMENT | CUTANEOUS | Status: AC
  Filled 2016-11-29: qty 28.35

## 2016-11-29 MED ORDER — POTASSIUM CHLORIDE IN NACL 20-0.9 MEQ/L-% IV SOLN: INTRAVENOUS | Status: DC

## 2016-11-29 MED ORDER — EPHEDRINE 5 MG/ML INJ
INTRAVENOUS | Status: AC
  Filled 2016-11-29: qty 10

## 2016-11-29 MED ORDER — DOCUSATE SODIUM 100 MG PO CAPS: 100.0000 mg | ORAL_CAPSULE | Freq: Every day | ORAL | Status: DC | PRN

## 2016-11-29 MED ORDER — BISACODYL 10 MG RE SUPP: 10.0000 mg | Freq: Every day | RECTAL | Status: DC | PRN

## 2016-11-29 MED ORDER — PHENOL 1.4 % MT LIQD: 1.0000 | OROMUCOSAL | Status: DC | PRN

## 2016-11-29 MED ORDER — CEPHALEXIN 250 MG PO CAPS
250.0000 mg | ORAL_CAPSULE | Freq: Three times a day (TID) | ORAL | Status: DC
  Administered 2016-11-29 – 2016-11-30 (×3): 250 mg via ORAL
  Filled 2016-11-29 (×4): qty 1

## 2016-11-29 MED ORDER — EPHEDRINE SULFATE-NACL 50-0.9 MG/10ML-% IV SOSY
PREFILLED_SYRINGE | INTRAVENOUS | Status: DC | PRN
Start: 2016-11-29 — End: 2016-11-29
  Administered 2016-11-29: 5 mg via INTRAVENOUS

## 2016-11-29 MED ORDER — HEMOSTATIC AGENTS (NO CHARGE) OPTIME
TOPICAL | Status: DC | PRN
  Administered 2016-11-29 (×2): 1 via TOPICAL

## 2016-11-29 MED ORDER — METHOCARBAMOL 1000 MG/10ML IJ SOLN: 500.0000 mg | Freq: Four times a day (QID) | INTRAVENOUS | Status: DC | PRN

## 2016-11-29 MED ORDER — POLYETHYLENE GLYCOL 3350 17 G PO PACK: 17.0000 g | PACK | Freq: Every day | ORAL | Status: DC | PRN

## 2016-11-29 MED ORDER — VANCOMYCIN HCL IN DEXTROSE 1-5 GM/200ML-% IV SOLN
1000.0000 mg | Freq: Once | INTRAVENOUS | Status: AC
  Administered 2016-11-29: 1000 mg via INTRAVENOUS
  Filled 2016-11-29: qty 200

## 2016-11-29 MED ORDER — ONDANSETRON HCL 4 MG PO TABS: 4.0000 mg | ORAL_TABLET | Freq: Four times a day (QID) | ORAL | Status: DC | PRN

## 2016-11-29 MED ORDER — CARBIDOPA-LEVODOPA 25-100 MG PO TABS
1.0000 | ORAL_TABLET | ORAL | Status: DC
  Filled 2016-11-29 (×3): qty 1

## 2016-11-29 MED ORDER — SODIUM BICARBONATE 4 % IV SOLN
INTRAVENOUS | Status: DC | PRN
  Administered 2016-11-29: 5 mL

## 2016-11-29 MED ORDER — FLEET ENEMA 7-19 GM/118ML RE ENEM: 1.0000 | ENEMA | Freq: Once | RECTAL | Status: DC | PRN

## 2016-11-29 MED ORDER — CRANBERRY 250 MG PO TABS: ORAL_TABLET | Freq: Every day | ORAL | Status: DC

## 2016-11-29 MED ORDER — FENTANYL CITRATE (PF) 100 MCG/2ML IJ SOLN
INTRAMUSCULAR | Status: AC
  Filled 2016-11-29: qty 2

## 2016-11-29 MED ORDER — ONDANSETRON HCL 4 MG/2ML IJ SOLN: 4.0000 mg | Freq: Four times a day (QID) | INTRAMUSCULAR | Status: DC | PRN

## 2016-11-29 MED ORDER — ENTACAPONE 200 MG PO TABS
200.0000 mg | ORAL_TABLET | Freq: Three times a day (TID) | ORAL | Status: DC
  Administered 2016-11-29 (×2): 200 mg via ORAL
  Filled 2016-11-29 (×4): qty 1

## 2016-11-29 MED ORDER — LACTATED RINGERS IV SOLN
INTRAVENOUS | Status: DC | PRN
  Administered 2016-11-29: 07:00:00 via INTRAVENOUS

## 2016-11-29 MED ORDER — MORPHINE SULFATE (PF) 4 MG/ML IV SOLN: 2.0000 mg | INTRAVENOUS | Status: DC | PRN

## 2016-11-29 MED ORDER — LIDOCAINE-EPINEPHRINE 2 %-1:100000 IJ SOLN
INTRAMUSCULAR | Status: DC | PRN
  Administered 2016-11-29: 22.5 mL

## 2016-11-29 MED ORDER — ATROPINE SULFATE 1 % OP SOLN: 1.0000 [drp] | Freq: Every day | OPHTHALMIC | Status: DC

## 2016-11-29 MED ORDER — VANCOMYCIN HCL IN DEXTROSE 1-5 GM/200ML-% IV SOLN
1000.0000 mg | INTRAVENOUS | Status: AC
  Administered 2016-11-29: 1000 mg via INTRAVENOUS
  Filled 2016-11-29: qty 200

## 2016-11-29 MED ORDER — MENTHOL 3 MG MT LOZG
1.0000 | LOZENGE | OROMUCOSAL | Status: DC | PRN
Start: 2016-11-29 — End: 2016-11-30

## 2016-11-29 MED ORDER — DEXMEDETOMIDINE HCL IN NACL 200 MCG/50ML IV SOLN
INTRAVENOUS | Status: DC | PRN
  Administered 2016-11-29: .1 ug/kg/h via INTRAVENOUS

## 2016-11-29 MED ORDER — BUDESONIDE 0.25 MG/2ML IN SUSP
0.2500 mg | Freq: Two times a day (BID) | RESPIRATORY_TRACT | Status: DC
  Administered 2016-11-29: 0.25 mg via RESPIRATORY_TRACT
  Filled 2016-11-29 (×2): qty 2

## 2016-11-29 MED ORDER — MIDAZOLAM HCL 2 MG/2ML IJ SOLN
INTRAMUSCULAR | Status: AC
  Filled 2016-11-29: qty 2

## 2016-11-29 MED ORDER — ZOLPIDEM TARTRATE 5 MG PO TABS: 5.0000 mg | ORAL_TABLET | Freq: Every evening | ORAL | Status: DC | PRN

## 2016-11-29 MED ORDER — BACITRACIN ZINC 500 UNIT/GM EX OINT
TOPICAL_OINTMENT | CUTANEOUS | Status: DC | PRN
  Administered 2016-11-29: 1 via TOPICAL

## 2016-11-29 MED ORDER — FUROSEMIDE 20 MG PO TABS: 20.0000 mg | ORAL_TABLET | Freq: Every day | ORAL | Status: DC

## 2016-11-29 SURGICAL SUPPLY — 72 items
BANDAGE ADH SHEER 1  50/CT (GAUZE/BANDAGES/DRESSINGS) IMPLANT
BIT DRILL NEURO 2X3.1 SFT TUCH (MISCELLANEOUS) IMPLANT
BLADE CLIPPER SURG (BLADE) ×3 IMPLANT
BLADE SURG 11 STRL SS (BLADE) ×3 IMPLANT
BNDG GAUZE ELAST 4 BULKY (GAUZE/BANDAGES/DRESSINGS) IMPLANT
BOOT SUTURE AID YELLOW STND (SUTURE) ×3 IMPLANT
CABLE EXTENSION OR (MISCELLANEOUS) ×3 IMPLANT
CABLE MER (MISCELLANEOUS) ×3 IMPLANT
CANISTER SUCT 3000ML PPV (MISCELLANEOUS) ×3 IMPLANT
CARTRIDGE OIL MAESTRO DRILL (MISCELLANEOUS) ×1 IMPLANT
CLIP RANEY DISP (INSTRUMENTS) ×9 IMPLANT
DECANTER SPIKE VIAL GLASS SM (MISCELLANEOUS) ×6 IMPLANT
DIFFUSER DRILL AIR PNEUMATIC (MISCELLANEOUS) ×3 IMPLANT
DRAPE POUCH INSTRU U-SHP 10X18 (DRAPES) ×3 IMPLANT
DRAPE STERI IOBAN 125X83 (DRAPES) IMPLANT
DRILL NEURO 2X3.1 SOFT TOUCH (MISCELLANEOUS)
DRSG OPSITE 4X5.5 SM (GAUZE/BANDAGES/DRESSINGS) ×6 IMPLANT
DRSG TEGADERM 2-3/8X2-3/4 SM (GAUZE/BANDAGES/DRESSINGS) ×12 IMPLANT
DRSG TELFA 3X8 NADH (GAUZE/BANDAGES/DRESSINGS) ×3 IMPLANT
DURAPREP 6ML APPLICATOR 50/CS (WOUND CARE) ×6 IMPLANT
DURASEAL APPLICATOR TIP (TIP) ×6 IMPLANT
DURASEAL SPINE SEALANT 3ML (MISCELLANEOUS) ×6 IMPLANT
GAUZE SPONGE 4X4 12PLY STRL (GAUZE/BANDAGES/DRESSINGS) IMPLANT
GAUZE SPONGE 4X4 16PLY XRAY LF (GAUZE/BANDAGES/DRESSINGS) IMPLANT
GLOVE BIO SURGEON STRL SZ8 (GLOVE) ×9 IMPLANT
GLOVE BIOGEL PI IND STRL 7.5 (GLOVE) ×1 IMPLANT
GLOVE BIOGEL PI IND STRL 8 (GLOVE) ×3 IMPLANT
GLOVE BIOGEL PI IND STRL 8.5 (GLOVE) ×1 IMPLANT
GLOVE BIOGEL PI INDICATOR 7.5 (GLOVE) ×2
GLOVE BIOGEL PI INDICATOR 8 (GLOVE) ×6
GLOVE BIOGEL PI INDICATOR 8.5 (GLOVE) ×2
GLOVE ECLIPSE 7.5 STRL STRAW (GLOVE) ×12 IMPLANT
GLOVE ECLIPSE 8.0 STRL XLNG CF (GLOVE) ×3 IMPLANT
GLOVE EXAM NITRILE LRG STRL (GLOVE) IMPLANT
GLOVE EXAM NITRILE XL STR (GLOVE) IMPLANT
GLOVE EXAM NITRILE XS STR PU (GLOVE) IMPLANT
GOWN STRL REUS W/ TWL LRG LVL3 (GOWN DISPOSABLE) IMPLANT
GOWN STRL REUS W/ TWL XL LVL3 (GOWN DISPOSABLE) ×1 IMPLANT
GOWN STRL REUS W/TWL 2XL LVL3 (GOWN DISPOSABLE) ×6 IMPLANT
GOWN STRL REUS W/TWL LRG LVL3 (GOWN DISPOSABLE)
GOWN STRL REUS W/TWL XL LVL3 (GOWN DISPOSABLE) ×2
HEMOSTAT POWDER KIT SURGIFOAM (HEMOSTASIS) ×3 IMPLANT
KIT BASIN OR (CUSTOM PROCEDURE TRAY) ×3 IMPLANT
KIT COVER BURR HOLE (Miscellaneous) ×2 IMPLANT
KIT HEADREST PASSIVE (Neuro Prosthesis/Implant) ×3 IMPLANT
KIT LEAD 45CM (Miscellaneous) ×6 IMPLANT
KIT NEUROSTIM DBS W/BURR COVER (Miscellaneous) ×2 IMPLANT
KIT PLATFORM UNI 4LEG STARFIX (KITS) IMPLANT
KIT ROOM TURNOVER OR (KITS) ×3 IMPLANT
MARKER SKIN DUAL TIP RULER LAB (MISCELLANEOUS) ×3 IMPLANT
NEEDLE HYPO 25X1 1.5 SAFETY (NEEDLE) ×3 IMPLANT
NEEDLE SPNL 18GX3.5 QUINCKE PK (NEEDLE) IMPLANT
NEEDLE SPNL 22GX3.5 QUINCKE BK (NEEDLE) IMPLANT
NS IRRIG 1000ML POUR BTL (IV SOLUTION) ×3 IMPLANT
OIL CARTRIDGE MAESTRO DRILL (MISCELLANEOUS) ×3
PACK LAMINECTOMY NEURO (CUSTOM PROCEDURE TRAY) ×3 IMPLANT
PAD ARMBOARD 7.5X6 YLW CONV (MISCELLANEOUS) ×6 IMPLANT
PERFORATOR LRG  14-11MM (BIT) ×2
PERFORATOR LRG 14-11MM (BIT) ×1 IMPLANT
PLATEFORM ARRAY DZAP LEADPOINT (MISCELLANEOUS) ×2
PLATFORM ARRAY DZAP LEADPOINT (MISCELLANEOUS) ×1 IMPLANT
PLATFORM BILATERAL KIT (MISCELLANEOUS) ×2 IMPLANT
SET SINGLE IT STRL (KITS) ×3 IMPLANT
SPONGE SURGIFOAM ABS GEL SZ50 (HEMOSTASIS) IMPLANT
STAPLER SKIN PROX WIDE 3.9 (STAPLE) ×6 IMPLANT
SUT ETHILON 3 0 PS 1 (SUTURE) ×3 IMPLANT
SUT SILK 2 0 TIES 10X30 (SUTURE) ×3 IMPLANT
SUT VIC AB 2-0 CP2 18 (SUTURE) ×6 IMPLANT
TOWEL GREEN STERILE (TOWEL DISPOSABLE) ×2 IMPLANT
TOWEL GREEN STERILE FF (TOWEL DISPOSABLE) ×2 IMPLANT
TRAY FOLEY W/METER SILVER 16FR (SET/KITS/TRAYS/PACK) IMPLANT
WATER STERILE IRR 1000ML POUR (IV SOLUTION) ×3 IMPLANT

## 2016-11-29 NOTE — Progress Notes (Signed)
Awake, alert, conversant.  PERRL, EOMI, MAEW without drift.  Doing well.

## 2016-11-29 NOTE — Procedures (Signed)
Preoperative diagnosis:  Parkinsons disease Postoperative diagnosis:  same Surgeon:  Erline Levine Neurologist:  Wells Guiles Isam Unrein  CPT codes: [phone] (first hour) [phone] (for each additional hour) Indication for procedure: Determination of optimal electrode position for deep brain stimulation therapy. Complications: None   Description of procedure:  Following the incision and burr hole placement, a recording microelectrode was slowly advanced into the brain via a motorized drive.  Beginning approximately 10 mm above the target, recordings were periodically made, and the resultant brain activity was described on the neurophysiologic recording worksheet.  Relevant samples of the recordings were saved for subsequent off line analysis.  A total of one recording pass was obtained on the left side of the brain.    4.5 mm of STN  were recorded from the left side of the brain. The sensory-motor subregion of the STN was identified. Following the recording, the recording electrode was replaced by a stimulating electrode by Dr. Vertell Limber.  Test stimulations were then obtained.  Active contacts that were used and the response to stimulation, including both adverse effects and therapeutic effects, were recorded on the neurophysiologic stimulation worksheet.  In brief, there was complete resolution of tremor, rigidity, and bradykinesia at therapeutic voltages.   Adverse effects only occurred at supratherapeutic voltages and consisted of facial paresthesias.  A target of mirror image of the initial side was selected for the contralateral side and one recording pass was obtained on the opposite side of the brain.  6 mm of STN were recorded from the left side of the brain.  The sensory-motor subregion was identified.  Tremor cells and response to passive stimulation were noted.  The recording electrode was again replaced by a stimulating electrode and test stimulations were obtained.  Again, there was resolution of  Parkinsonian sx's but no side effects were noted.   Final electrode position was determined and the electrode was secured in place by Dr. Vertell Limber on each side.  Alonza Bogus, DO [facility] Director of Movement Disorders

## 2016-11-29 NOTE — Op Note (Signed)
11/29/2016  12:03 PM  PATIENT:  Ana Salazar  71 y.o. female  PRE-OPERATIVE DIAGNOSIS:  Parkinson Disease  POST-OPERATIVE DIAGNOSIS:  Parkinson Disease  PROCEDURE:  Procedure(s) with comments: Bilateral Deep brain stimulator placement (Bilateral) - Bilateral Deep brain stimulator placement Bilateral STN nuclei with microelectrode recording using [employer]  SURGEON:  Surgeon(s) and Role:    * Erline Levine, MD - Primary  PHYSICIAN ASSISTANT:   ASSISTANTS: [place], RN   ANESTHESIA:   IV sedation  EBL:  Total I/O In: 400 [I.V.:400] Out: 50 [Blood:50]  BLOOD ADMINISTERED:none  DRAINS: none   LOCAL MEDICATIONS USED:  MARCAINE    and LIDOCAINE   SPECIMEN:  No Specimen  DISPOSITION OF SPECIMEN:  N/A  COUNTS:  YES  TOURNIQUET:  * No tourniquets in log *  DICTATION: DICTATION:  Indications: Patient is a 71 year old woman with Parkinson's Disease. It was elected to take her to surgery for bilateral STN  deep brain stimulator electrode placement  Procedure: Preoperative planing was performed with volumetricCT and placement of 4 fiducial markers in the skull followed by CT of the brain also obtained volumetrically. These were then exported to create Starfix head frame with planned targeting of STN nucleus electrodes was performed. The patient was brought to the operating room and placed in a semi-Fowler's position with her neck and stabilized in a neck Wiest. This was affixed to the [place] adapter. Her scalp was then prepped with DuraPrep and subsequently draped with an Ioban drape.with the fiducials and the skin was infiltrated with local lidocaine.  The areas of planned incision were then infiltrated with local lidocaine with epinephrine. The stepoffs were connected and the Starfix frame was assembled.  The entry points were then marked.  One linear incision was made centered on the bilateral entry points. An elevator was used to clear pericranium from the skull. The  perforator it was then used to produce two 14 mm bur holes. The dura was coagulated with bipolar electrocautery. Initially we operated on the left and subsequently on the right. After opening the dura and a localizing the entry point the stylette and outer cannula were inserted into the brain. Surgifoam and Duraseal was placed to prevent CSF leakage at each entry site. Microelectrode recordings were then performed and  we had very good recordings from the STN for 5 mm. Subsequently the stimulating electrode was placed and the patient had significant improvement in tremor and rigidity on the left side of the body without significant side effects to higher voltages. The electrode was then locked into position with the locking cap. The redundant electrode was circularized and tunneled under the scalp. The assembly was reassembled on the right.  There was good microelectrode recordings for 6 mm of STN and after placing the stimulating electrode the patient had excellent control of tremor and rigidityon the right side of her body.  We therefore elected to place this electrode and locked into position and the stereotactic assembly was disassembled. The electrodes were both tunneled to the right and then into the posterior scalp and locked into position. The wounds were then irrigated and closed with 2-0 Vicryl sutures and staples. The fiducials were removed and staples were placed over each of these sites. The head was washed and then sterile occlusive dressings were placed. The patient was taken to recovery having tolerated her procedure without difficulty or untoward effect. Please refer to detailed microelectrode recordings from Dr. Carles Collet for more specifics of the positioning of the electrodes. These are included  in her Epic note from the detailed neural monitoring and physical exam assessment during the surgery.  PLAN OF CARE: Admit to inpatient   PATIENT DISPOSITION:  PACU - hemodynamically stable.   Delay start  of Pharmacological VTE agent (>24hrs) due to surgical blood loss or risk of bleeding: yes

## 2016-11-29 NOTE — Anesthesia Preprocedure Evaluation (Signed)
Anesthesia Evaluation  Patient identified by MRN, date of birth, ID band Patient awake    Reviewed: Allergy & Precautions, NPO status , Patient's Chart, lab work & pertinent test results  Airway Mallampati: II  TM Distance: >3 FB     Dental   Pulmonary shortness of breath, asthma , sleep apnea , COPD, former smoker,    breath sounds clear to auscultation       Cardiovascular hypertension, +CHF   Rhythm:Regular Rate:Normal     Neuro/Psych    GI/Hepatic negative GI ROS, Neg liver ROS,   Endo/Other  Hypothyroidism   Renal/GU Renal disease     Musculoskeletal   Abdominal   Peds  Hematology  (+) anemia ,   Anesthesia Other Findings   Reproductive/Obstetrics                             Anesthesia Physical Anesthesia Plan  ASA: III  Anesthesia Plan: MAC   Post-op Pain Management:    Induction: Intravenous  Airway Management Planned: Simple Face Mask and Mask  Additional Equipment:   Intra-op Plan:   Post-operative Plan:   Informed Consent: I have reviewed the patients History and Physical, chart, labs and discussed the procedure including the risks, benefits and alternatives for the proposed anesthesia with the patient or authorized representative who has indicated his/her understanding and acceptance.   Dental advisory given  Plan Discussed with: CRNA, Anesthesiologist and Surgeon  Anesthesia Plan Comments:         Anesthesia Quick Evaluation

## 2016-11-29 NOTE — Progress Notes (Signed)
Pharmacy Antibiotic Note  Ana Salazar is a 71 y.o. female admitted on 11/29/2016 with surgical prophylaxis.  Pharmacy has been consulted for vancomycin dosing.  Patient received pre-op vancomycin 1g at 0800 this morning  Plan: Vancomycin 1g IV x1 to be given tonight at [place] to sign off as no drain, and no further doses required  Height: 5\' 5"  (165.1 cm) Weight: 168 lb (76.2 kg) IBW/kg (Calculated) : 57  Temp (24hrs), Avg:97.5 F (36.4 C), Min:97 F (36.1 C), Max:98.1 F (36.7 C)   Recent Labs Lab 11/29/16 0618  WBC 7.3  CREATININE 0.81    Estimated Creatinine Clearance: 67.9 mL/min (by C-G formula based on SCr of 0.81 mg/dL).    Allergies  Allergen Reactions  . Amoxicillin Anaphylaxis, Swelling and Rash    Has patient had a PCN reaction causing immediate rash, facial/tongue/throat swelling, SOB or lightheadedness with hypotension: Yes PCN reaction causing SEVERE RASH INVOLVING MUCUS MEMBRANES or SKIN NECROSIS: Yes Has patient had a PCN reaction that required hospitalization: No Has patient had a PCN reaction occurring within the last 10 years: Yes   . Prednisone Shortness Of Breath and Nausea Only    Hypertension   . Amlodipine Itching and Other (See Comments)    BURNING  . Levothyroxine Other (See Comments)    Tired, shaky, tremors  . Meloxicam Swelling    SWELLING REACTION UNSPECIFIED  "Burning "  . Nebivolol Rash  . Nsaids Nausea And Vomiting  . Valsartan Itching   Thank you for allowing pharmacy to be a part of this patient's care.  Carleah Yablonski 11/29/2016 2:20 PM

## 2016-11-29 NOTE — H&P (View-Only) (Signed)
Patient ID:   [id number] Patient: Ana Salazar  Date of Birth: 1946/02/14 Visit Type: Office Visit   Date: 09/19/2016 03:30 PM Provider: Marchia Meiers. Vertell Limber MD   This 71 year old female presents for Parkinson's Disease.  History of Present Illness: 1.  Parkinson's Disease    Ana Salazar, 71 year old retired female, visits to discuss deep brain stimulation for her Parkinson's.  She has discussed this option with Dr. Carles Collet her neurologist.  History: HTN, asthma, hypothyroidism, kidney disease, tachycardia, Parkinson's Surgical history: Fibroid tumor 1998, C-section 1976; left arm sebaceous tumor 2016; thyroid surgery 2014; tonsillectomy and hysterectomy years ago  Dr. Wynonia Lawman is her cardiologist  MRI [facility]        PAST MEDICAL/SURGICAL HISTORY   (Detailed)  Disease/disorder Onset Date Management Date Comments    Thyroidectomy      fibroid tumor  RRM 09/19/2016 -    arm tumor  RRM 09/19/2016 -    c-section  RRM 09/19/2016 -  Cancer, stomach    RRM 09/19/2016 -  COPD      Hypertension      Thyroid disease         Family History  (Detailed)   SOCIAL HISTORY  (Detailed) Tobacco use reviewed. Preferred language is Unknown.   Smoking status: Never smoker.  SMOKING STATUS Use Status Type Smoking Status Usage Per Day Years Used Total Pack Years  no/never  Never smoker             MEDICATIONS(added, continued or stopped this visit): Started Medication Directions Instruction Stopped   citalopram 20 mg tablet take 1 tablet by oral route  every day     clonazepam 0.5 mg tablet take 1 tablet by oral route  every day     entacapone 200 mg tablet take 1 tablet by oral route 3 times every day in combination with carbidopa and levodopa     potassium chloride ER 10 mEq capsule,extended release take 4 capsule by oral route 2 times every day with food     Sinemet CR 25 mg-100 mg tablet,extended release take 1 tablet by oral route 2 times every day     Sinemet CR 50 mg-200 mg  tablet,extended release take 1 tablet by oral route  every day     verapamil 80 mg tablet take 1 tablet by oral route  every day       ALLERGIES: Ingredient Reaction Medication Name Comment  AMLODIPINE BESYLATE  Norvasc   NSAIDS (NON-STEROIDAL ANTI-INFLAMMATORY DRUG)     VALSARTAN     MELOXICAM     NEBIVOLOL     LEVOTHYROXINE     AMPICILLIN     NEBIVOLOL HCL  Bystolic    Reviewed, updated.    Vitals Date Temp F BP Pulse Ht In Wt Lb BMI BSA Pain Score  09/19/2016  140/82 94 65 180 29.95  0/10     PHYSICAL EXAM General Level of Distress: no acute distress Overall Appearance: normal    Cardiovascular Cardiac: regular rate and rhythm without murmur  Right Left  Carotid Pulses: normal normal  Respiratory Lungs: clear to auscultation  Neurological Orientation: normal Recent and Remote Memory: normal Attention Span and Concentration:   normal Language: normal Fund of Knowledge: normal  Right Left Sensation: normal normal Upper Extremity Coordination: normal normal  Lower Extremity Coordination: normal normal  Musculoskeletal Gait and Station: normal  Right Left Upper Extremity Muscle Strength: normal normal Lower Extremity Muscle Strength: normal normal Upper Extremity Muscle Tone:  normal normal Lower  Extremity Muscle Tone: normal normal  Motor Strength Upper and lower extremity motor strength was tested in the clinically pertinent muscles.     Deep Tendon Reflexes  Right Left Biceps: normal normal Triceps: normal normal Brachioradialis: normal normal Patellar: normal normal Achilles: normal normal  Sensory Sensation was tested at C2 to T1.   Cranial Nerves II. Optic Nerve/Visual Fields: normal III. Oculomotor: normal IV. Trochlear: normal V. Trigeminal: normal VI. Abducens: normal VII. Facial: normal VIII. Acoustic/Vestibular: normal IX. Glossopharyngeal: normal X. Vagus: normal XI. Spinal Accessory: normal XII.  Hypoglossal: normal  Motor and other Tests Lhermittes: negative Rhomberg: negative Pronator drift: absent     Right Left Spurlings negative negative Hoffman's: normal normal Clonus: normal normal Babinski: normal normal SLR: negative negative Patrick's Corky Sox): negative negative Toe Walk: normal normal Toe Lift: normal normal Heel Walk: normal normal Tinels Elbow: negative negative Tinels Wrist: negative negative Phalen: negative negative   Additional Findings:  Symmetric reflexes, no pronator drift.    IMPRESSION The patient comes in expressing desire for relief for her dyskinesias and other Parkinson's symptoms.  We discussed DBS surgery and IPG placement and she would like to pursue it  She is not on any anti-platelet agents  or blood thinners.  On confrontational testing, she has symmetric reflexes and no pronator drift.  She has mild dyskinesias of both upper extremities on exam today.  She needs to be cleared by Dr. Wynonia Lawman before surgery.  Completed Orders (this encounter) Order Details Reason Side Interpretation Result Initial Treatment Date Region  Lifestyle education regarding diet Encouraged patient to eat well balanced diet.        Lifestyle education Patient will follow up with Primary care physician.         Assessment/Plan # Detail Type Description   1. Assessment Body mass index (BMI) 29.0-29.9, adult (O96.29).   Plan Orders Today's instructions / counseling include(s) Lifestyle education regarding diet.       2. Assessment Essential (primary) hypertension (I10).         Pain Assessment/Treatment Pain Scale: 0/10. Method: Numeric Pain Intensity Scale. Onset: 09/20/2011.  Gain clearance from Dr. Wynonia Lawman.  Schedule bilateral STN DBS surgery and IPG placement.  Nurse Education Given.  Given booklet and DVD on DBS treatment for Parkinson's Disease.  Orders: Instruction(s)/Education: Assessment Instruction  I10 Lifestyle education  [phone] Lifestyle  education regarding diet             Provider:  Marchia Meiers. Vertell Limber MD  09/19/2016 04:56 PM Dictation edited by: Daine Gravel    CC Providers: Alonza Bogus [address] [place], [place] [phone]              Electronically signed by Marchia Meiers. Vertell Limber MD on 09/21/2016 09:58 AM

## 2016-11-29 NOTE — Transfer of Care (Signed)
Immediate Anesthesia Transfer of Care Note  Patient: Ana Salazar  Procedure(s) Performed: Procedure(s) with comments: Bilateral Deep brain stimulator placement (Bilateral) - Bilateral Deep brain stimulator placement  Patient Location: PACU  Anesthesia Type:MAC  Level of Consciousness: awake, alert , oriented and patient cooperative  Airway & Oxygen Therapy: Patient Spontanous Breathing and Patient connected to nasal cannula oxygen  Post-op Assessment: Report given to RN, Post -op Vital signs reviewed and stable, Patient moving all extremities X 4 and Patient able to stick tongue midline  Post vital signs: Reviewed and stable  Last Vitals:  Vitals:   11/29/16 0622 11/29/16 1157  BP: (!) 188/68   Pulse: (!) 52   Resp: (!) 30 (P) 20  Temp: 36.7 C (P) 36.1 C    Last Pain:  Vitals:   11/29/16 0622  TempSrc: Oral      Patients Stated Pain Goal: 7 (73/53/29 [id number])  Complications: No apparent anesthesia complications

## 2016-11-29 NOTE — Interval H&P Note (Signed)
History and Physical Interval Note:  11/29/2016 8:01 AM  Ana Salazar  has presented today for surgery, with the diagnosis of Parkinson Disease  The various methods of treatment have been discussed with the patient and family. After consideration of risks, benefits and other options for treatment, the patient has consented to  Procedure(s) with comments: Bilateral Deep brain stimulator placement (Bilateral) - Bilateral Deep brain stimulator placement as a surgical intervention .  The patient's history has been reviewed, patient examined, no change in status, stable for surgery.  I have reviewed the patient's chart and labs.  Questions were answered to the patient's satisfaction.     Dellis Voght D

## 2016-11-29 NOTE — Anesthesia Procedure Notes (Signed)
Procedure Name: MAC Date/Time: 11/29/2016 8:15 AM Performed by: Everlean Cherry A Pre-anesthesia Checklist: Patient identified, Emergency Drugs available, Suction available and Patient being monitored Patient Re-evaluated:Patient Re-evaluated prior to inductionOxygen Delivery Method: Nasal cannula

## 2016-11-29 NOTE — Brief Op Note (Signed)
11/29/2016  12:03 PM  PATIENT:  Ana Salazar  71 y.o. female  PRE-OPERATIVE DIAGNOSIS:  Parkinson Disease  POST-OPERATIVE DIAGNOSIS:  Parkinson Disease  PROCEDURE:  Procedure(s) with comments: Bilateral Deep brain stimulator placement (Bilateral) - Bilateral Deep brain stimulator placement Bilateral STN nuclei with microelectrode recording using [employer]  SURGEON:  Surgeon(s) and Role:    * Erline Levine, MD - Primary  PHYSICIAN ASSISTANT:   ASSISTANTS: Poteat, RN   ANESTHESIA:   IV sedation  EBL:  Total I/O In: 400 [I.V.:400] Out: 50 [Blood:50]  BLOOD ADMINISTERED:none  DRAINS: none   LOCAL MEDICATIONS USED:  MARCAINE    and LIDOCAINE   SPECIMEN:  No Specimen  DISPOSITION OF SPECIMEN:  N/A  COUNTS:  YES  TOURNIQUET:  * No tourniquets in log *  DICTATION: DICTATION:  Indications: Patient is a 71 year old woman with Parkinson's Disease. It was elected to take her to surgery for bilateral STN  deep brain stimulator electrode placement  Procedure: Preoperative planing was performed with volumetricCT and placement of 4 fiducial markers in the skull followed by CT of the brain also obtained volumetrically. These were then exported to create Starfix head frame with planned targeting of STN nucleus electrodes was performed. The patient was brought to the operating room and placed in a semi-Fowler's position with her neck and stabilized in a neck Kennan. This was affixed to the [place] adapter. Her scalp was then prepped with DuraPrep and subsequently draped with an Ioban drape.with the fiducials and the skin was infiltrated with local lidocaine.  The areas of planned incision were then infiltrated with local lidocaine with epinephrine. The stepoffs were connected and the Starfix frame was assembled.  The entry points were then marked.  One linear incision was made centered on the bilateral entry points. An elevator was used to clear pericranium from the skull. The  perforator it was then used to produce two 14 mm bur holes. The dura was coagulated with bipolar electrocautery. Initially we operated on the left and subsequently on the right. After opening the dura and a localizing the entry point the stylette and outer cannula were inserted into the brain. Surgifoam and Duraseal was placed to prevent CSF leakage at each entry site. Microelectrode recordings were then performed and  we had very good recordings from the STN for 5 mm. Subsequently the stimulating electrode was placed and the patient had significant improvement in tremor and rigidity on the left side of the body without significant side effects to higher voltages. The electrode was then locked into position with the locking cap. The redundant electrode was circularized and tunneled under the scalp. The assembly was reassembled on the right.  There was good microelectrode recordings for 6 mm of STN and after placing the stimulating electrode the patient had excellent control of tremor and rigidityon the right side of her body.  We therefore elected to place this electrode and locked into position and the stereotactic assembly was disassembled. The electrodes were both tunneled to the right and then into the posterior scalp and locked into position. The wounds were then irrigated and closed with 2-0 Vicryl sutures and staples. The fiducials were removed and staples were placed over each of these sites. The head was washed and then sterile occlusive dressings were placed. The patient was taken to recovery having tolerated her procedure without difficulty or untoward effect. Please refer to detailed microelectrode recordings from Dr. Carles Collet for more specifics of the positioning of the electrodes. These are included  in her Epic note from the detailed neural monitoring and physical exam assessment during the surgery.  PLAN OF CARE: Admit to inpatient   PATIENT DISPOSITION:  PACU - hemodynamically stable.   Delay start  of Pharmacological VTE agent (>24hrs) due to surgical blood loss or risk of bleeding: yes

## 2016-11-29 NOTE — Anesthesia Postprocedure Evaluation (Signed)
Anesthesia Post Note  Patient: Ana Salazar  Procedure(s) Performed: Procedure(s) (LRB): Bilateral Deep brain stimulator placement (Bilateral)  Patient location during evaluation: PACU Anesthesia Type: MAC Level of consciousness: awake Pain management: pain level controlled Respiratory status: spontaneous breathing Cardiovascular status: stable Anesthetic complications: no       Last Vitals:  Vitals:   11/29/16 1245 11/29/16 1300  BP: (!) 162/75 (!) 166/73  Pulse: (!) 58 (!) 59  Resp: 17 20  Temp:      Last Pain:  Vitals:   11/29/16 0622  TempSrc: Oral                 Nyari Olsson

## 2016-11-30 MED ORDER — MUPIROCIN 2 % EX OINT
TOPICAL_OINTMENT | CUTANEOUS | Status: AC
  Filled 2016-11-30: qty 22

## 2016-11-30 NOTE — Progress Notes (Signed)
Patient alert and oriented, mae's well, voiding adequate amount of urine, swallowing without difficulty, no c/o pain at time of discharge. Patient discharged home with family. Script and discharged instructions given to patient. Patient and family stated understanding of instructions given. Patient has an appointment with Dr.Stern    

## 2016-11-30 NOTE — Evaluation (Signed)
Physical Therapy Evaluation Patient Details Name: Ana Salazar MRN: [id number] DOB: Feb 28, 1946 Today's Date: 11/30/2016   History of Present Illness  71 yo female s/p Bilateral Deep brain stimulator placement (Bilateral)  Clinical Impression  Pt evaluation was limited due to incontinence and communication issues, but the patient shows potential for and needs improvements in bed mobility, transfers, ambulation, and safety awareness.  She will benefit from further PT services at the acute level to achieve baseline functional mobility before D/C to home, with family cooperation.    Follow Up Recommendations Supervision/Assistance - 24 hour    Equipment Recommendations  None recommended by PT    Recommendations for Other Services       Precautions / Restrictions Precautions Precautions: Fall Restrictions Weight Bearing Restrictions: No Other Position/Activity Restrictions: Incisions from deep brain stimulator procedure      Mobility  Bed Mobility Overal bed mobility: Needs Assistance Bed Mobility: Rolling;Sidelying to Sit Rolling: Min assist Sidelying to sit: Mod assist       General bed mobility comments: Pt very slow to respond to commands and required mod physical assistance to get to EOB.  Transfers Overall transfer level: Needs assistance Equipment used: Rolling walker (2 wheeled);2 person hand held assist Transfers: Sit to/from [facility] Sit to Stand: Min assist Stand pivot transfers: Min assist       General transfer comment: pt very fatigue and transfer not assessed at this time.   Ambulation/Gait Ambulation/Gait assistance: Mod assist;+2 physical assistance Ambulation Distance (Feet): 2 Feet Assistive device: 2 person hand held assist Gait Pattern/deviations: Shuffle;Trunk flexed;Narrow base of support Gait velocity: decreased Gait velocity interpretation: Below normal speed for age/gender General Gait Details: BSC to chair  Stairs            Wheelchair Mobility    Modified Rankin (Stroke Patients Only)       Balance Overall balance assessment: Needs assistance Sitting-balance support: Bilateral upper extremity supported;No upper extremity supported Sitting balance-Leahy Scale: Good     Standing balance support: Bilateral upper extremity supported Standing balance-Leahy Scale: Poor Standing balance comment: Completed 4 standing trials required to clean her up and change her.  Once standing up straight, pt able to maintain WB and balance with heavy reliance on RW.                             Pertinent Vitals/Pain Pain Assessment: No/denies pain    Home Living Family/patient expects to be discharged to:: Private residence Living Arrangements: Other relatives Available Help at Discharge: Family Type of Home: House       Home Layout: One level Home Equipment: Environmental consultant - 2 wheels;Cane - single point;Bedside commode;Shower seat;Wheelchair - manual Additional Comments: dgter and sister will help with all dressing / bathing 24/7 (A) upon d/c     Prior Function Level of Independence: Independent with assistive device(s)         Comments: Pt family member stated pt is independent with managing self care and incontinence.     Hand Dominance   Dominant Hand: Right    Extremity/Trunk Assessment   Upper Extremity Assessment Upper Extremity Assessment: RUE deficits/detail;Generalized weakness;LUE deficits/detail RUE Deficits / Details: tremor/ daughter reports dropping items more this AM then normal. Unable to sustain attention to jelly per family LUE Deficits / Details: tremor to hand    Lower Extremity Assessment Lower Extremity Assessment: Overall WFL for tasks assessed    Cervical / Trunk Assessment Cervical / Trunk  Assessment: Kyphotic  Communication   Communication: Expressive difficulties  Cognition Arousal/Alertness: Lethargic;Suspect due to medications Behavior During  Therapy:  (sedate) Overall Cognitive Status: Impaired/Different from baseline Area of Impairment: Orientation;Attention;Following commands                 Orientation Level: Disoriented to;Situation Current Attention Level: Focused Memory: Decreased short-term memory;Decreased recall of precautions Following Commands: Follows one step commands inconsistently;Follows one step commands with increased time Safety/Judgement: Decreased awareness of safety;Decreased awareness of deficits   Problem Solving: Slow processing General Comments: pt demonstrates deficits with word finding and expressive deficits. pt unable to repeat "today is Wedneday " initially and even after 2 attempts words remain slurred. pt with L eye droop and deficits opening but able to complete       General Comments General comments (skin integrity, edema, etc.): dressing intact and advised not to wash directly on the cut    Exercises     Assessment/Plan    PT Assessment Patient needs continued PT services  PT Problem List Decreased strength;Decreased balance;Decreased mobility;Decreased coordination;Decreased cognition;Decreased knowledge of use of DME;Decreased safety awareness;Decreased knowledge of precautions       PT Treatment Interventions DME instruction;Gait training;Stair training;Functional mobility training;Therapeutic activities;Therapeutic exercise;Balance training;Neuromuscular re-education;Cognitive remediation;Patient/family education    PT Goals (Current goals can be found in the Care Plan section)  Acute Rehab PT Goals PT Goal Formulation: Patient unable to participate in goal setting Time For Goal Achievement: 12/07/16 Potential to Achieve Goals: Fair    Frequency Min 5X/week   Barriers to discharge        Co-evaluation               End of Session Equipment Utilized During Treatment: Gait belt Activity Tolerance: Other (comment) (incontinence) Patient left: in chair;with  call bell/phone within reach Nurse Communication: Mobility status PT Visit Diagnosis: Unsteadiness on feet (R26.81);Other abnormalities of gait and mobility (R26.89)    Time: 0973-5329 PT Time Calculation (min) (ACUTE ONLY): 43 min   Charges:   PT Evaluation $PT Eval Moderate Complexity: 1 Procedure PT Treatments $Therapeutic Activity: 23-37 mins   PT G Codes:        Gaetano Net SPT  Gaetano Net 11/30/2016, 1:39 PM

## 2016-11-30 NOTE — Discharge Summary (Signed)
Physician Discharge Summary  Patient ID: Ana Salazar MRN: [id number] DOB/AGE: 69-12-1945 69 y.o.  Admit date: 11/29/2016 Discharge date: 11/30/2016  Admission Diagnoses:Parkinson Disease     Discharge Diagnoses: Parkinson Disease s/p Bilateral Deep brain stimulator placement (Bilateral) - Bilateral Deep brain stimulator placement Bilateral STN nuclei with microelectrode recording using [employer]    Active Problems:   Parkinson's disease [facility])   Discharged Condition: good  Hospital Course: Ana Salazar was admitted for surgery with Dx Parkinsons Disease. Following uncomplicated bilateral STN microelectrode placement, she recovered well and transferred to [facility] for nursing care.   Consults: None  Significant Diagnostic Studies: radiology: CT scan: post-op  Treatments: surgery: Bilateral Deep brain stimulator placement (Bilateral) - Bilateral Deep brain stimulator placement Bilateral STN nuclei with microelectrode recording using [employer]     Discharge Exam: Blood pressure (!) 172/88, pulse 60, temperature 98.7 F (37.1 C), temperature source Oral, resp. rate 20, height 5\' 5"  (1.651 m), weight 76.2 kg (168 lb), SpO2 94 %. Alert, conversant. Daughter present. MAEW. PEARL. Mild periorbital swelling "puffy" on the left, not unexpected. Incisions well-approximated without swelling or drainage.      Disposition: 01-Home or [place] to leave incisions open to air. Triple antibiotic oint to sites next couple days, then leave clean & dry. May wash scalp with mild soap (avoid washcloth d/t staples). She has pain med at home for prn use.  Plan for implanted pulse generator placement next week.         Allergies as of 11/30/2016      Reactions   Amoxicillin Anaphylaxis, Swelling, Rash   Has patient had a PCN reaction causing immediate rash, facial/tongue/throat swelling, SOB or lightheadedness with hypotension: Yes PCN reaction causing SEVERE RASH  INVOLVING MUCUS MEMBRANES or SKIN NECROSIS: Yes Has patient had a PCN reaction that required hospitalization: No Has patient had a PCN reaction occurring within the last 10 years: Yes   Prednisone Shortness Of Breath, Nausea Only   Hypertension    Amlodipine Itching, Other (See Comments)   BURNING   Levothyroxine Other (See Comments)   Tired, shaky, tremors   Meloxicam Swelling   SWELLING REACTION UNSPECIFIED  "Burning "   Nebivolol Rash   Nsaids Nausea And Vomiting   Valsartan Itching      Medication List    TAKE these medications   ARNUITY ELLIPTA 100 MCG/ACT Aepb Generic drug:  Fluticasone Furoate Inhale 1 puff into the lungs daily.   atropine 1 % ophthalmic solution Use as directed What changed:  how much to take  when to take this  additional instructions   carbidopa-levodopa 25-100 MG tablet Commonly known as:  SINEMET IR 2 tablets at 7 AM/2 tablet at 10 AM//2 tablets at 1 PM/1 tablet at 4 PM and one tablet at 7 PM What changed:  how much to take  how to take this  when to take this  additional instructions   carbidopa-levodopa 50-200 MG tablet Commonly known as:  SINEMET CR Take 1 tablet by mouth at bedtime. What changed:  Another medication with the same name was changed. Make sure you understand how and when to take each.   cephALEXin 250 MG capsule Commonly known as:  KEFLEX Take 250 mg by mouth 3 (three) times daily.   cetirizine 10 MG tablet Commonly known as:  ZYRTEC Take 10 mg by mouth daily as needed for allergies.   citalopram 20 MG tablet Commonly known as:  CELEXA Take 20 mg by mouth  daily.   clonazePAM 0.5 MG tablet Commonly known as:  KLONOPIN TAKE 1/2 TABLET BY MOUTH AT BEDTIME   CRANBERRY PO Take 1 tablet by mouth daily.   docusate sodium 100 MG capsule Commonly known as:  COLACE Take 100 mg by mouth daily as needed for mild constipation.   entacapone 200 MG tablet Commonly known as:  COMTAN TAKE 1 TABLET THREE TIMES  PER DAY   EYE DROPS ADVANCED RELIEF OP Apply 1 drop to eye 2 (two) times daily as needed (dry eyes).   furosemide 20 MG tablet Commonly known as:  LASIX TAKE 1/2 TABLET DAILY AS NEEDED FOR SIGNIFICANT FLUID SWELLING ONLY   potassium chloride 10 MEQ tablet Commonly known as:  K-DUR,KLOR-CON Take 10 mEq by mouth daily at 12 noon.   verapamil 80 MG tablet Commonly known as:  CALAN Take 80 mg by mouth 3 (three) times daily.        Signed: Peggyann Shoals, MD 11/30/2016, 1:36 PM

## 2016-11-30 NOTE — Progress Notes (Addendum)
Subjective: Patient reports "I feel ok, a little headache, but they gave me some medicine"  Objective: Vital signs in last 24 hours: Temp:  [97 F (36.1 C)-98.7 F (37.1 C)] 98.7 F (37.1 C) (03/28 0807) Pulse Rate:  [56-63] 60 (03/28 0807) Resp:  [17-20] 20 (03/28 0807) BP: (122-174)/(64-88) 172/88 (03/28 0807) SpO2:  [94 %-99 %] 94 % (03/28 0807)  Intake/Output from previous day: 03/27 0701 - 03/28 0700 In: 1240 [P.O.:840; I.V.:400] Out: 50 [Blood:50] Intake/Output this shift: Total I/O In: 480 [P.O.:480] Out: -   Alert, conversant. Daughter present. MAEW. PEARL. Mild periorbital swelling "puffy" on the left, not unexpected. Incisions well-approximated without swelling or drainage.   Lab Results:  Recent Labs  11/29/16 0618  WBC 7.3  HGB 13.3  HCT 40.7  PLT 161   BMET  Recent Labs  11/29/16 0618  NA 143  K 3.4*  CL 104  CO2 29  GLUCOSE 108*  BUN 22*  CREATININE 0.81  CALCIUM 9.7    Studies/Results: Ct Head Wo Contrast  Result Date: 11/29/2016 CLINICAL DATA:  Status post deep brain stimulator placement. EXAM: CT HEAD WITHOUT CONTRAST TECHNIQUE: Contiguous axial images were obtained from the base of the skull through the vertex without intravenous contrast. COMPARISON:  11/22/2016 FINDINGS: Brain: New bilateral deep brain stimulator via burr holes. The leads appear symmetrically placed. Expected pneumocephalus. No intracranial hemorrhage, swelling, or infarct. No intraventricular extension. Vascular: Atherosclerotic calcification. Skull: Burr holes and expected scalp gas and soft tissue swelling. Sinuses/Orbits: Chronic opacification of bilateral mastoid and middle ear spaces. Polyp or retention cyst in the right maxillary antrum. IMPRESSION: No unexpected finding after deep brain stimulator placement. Electronically Signed   By: Monte Fantasia M.D.   On: 11/29/2016 15:41    Assessment/Plan: stable  LOS: 1 day  Per DrStern, d/c to home. Ok to leave  incisions open to air. Triple antibiotic oint to sites next couple days, then leave clean & dry. May wash scalp with mild soap (avoid washcloth d/t staples). She has pain med at home for prn use.  Plan for implanted pulse generator placement next week.     Verdis Prime 11/30/2016, 10:48 AM  Patient is doing well.  Discharge home.

## 2016-11-30 NOTE — Evaluation (Signed)
Occupational Therapy Evaluation Patient Details Name: Ana Salazar MRN: [id number] DOB: 04-May-1946 Today's Date: 11/30/2016    History of Present Illness 71 yo female s/p Bilateral Deep brain stimulator placement (Bilateral)  Past Medical History:  Diagnosis Date  . Anemia   . Asthma   . CHF (congestive heart failure) ([place])   . Depression   . Dyspnea   . Hypertension   . Hypothyroidism   . Kidney disease    has frequent UTS's.  just finishing up antibiotics now.  . Neuromuscular disorder ([place])    parkinson's disease    dx 4 yr ago  . Overactive bladder   . Parkinson's disease ([place])   . Pneumonia   . Seasonal allergies   . Sleep apnea    tested in [place], 5-6 mths ago.  wears cpap. "sitting on 5"  . Stroke [facility])    "a bunch"   no deficits,  "cleared up".  Last one 4 yr ago  . Tachycardia      Clinical Impression   Patient is s/p brain stimulator placement surgery resulting in functional limitations due to the deficits listed below (see OT problem list). PTA was completing ADLS independently. Family increased (A) due to decr medications last week to prepare for surgery today. Education reviewed with family due to pending second surgery.  Patient will benefit from skilled OT acutely to increase independence and safety with ADLS to allow discharge [place].     Follow Up Recommendations  Home health OT;Supervision/Assistance - 24 hour    Equipment Recommendations  3 in 1 bedside commode    Recommendations for Other Services PT consult;Speech consult (slurred speech)     Precautions / Restrictions Precautions Precautions: Fall Restrictions Weight Bearing Restrictions: No Other Position/Activity Restrictions: Incisions from deep brain stimulator procedure      Mobility Bed Mobility Overal bed mobility: Needs Assistance Bed Mobility: Rolling;Sidelying to Sit Rolling: Min assist Sidelying to sit: Mod assist       General bed mobility comments: Pt very slow to  respond to commands and required mod physical assistance to get to EOB.  Transfers Overall transfer level: Needs assistance Equipment used: Rolling walker (2 wheeled);None Transfers: Sit to/from [employer] to Stand: Min assist Stand pivot transfers: Min assist       General transfer comment: pt very fatigue and transfer not assessed at this time.     Balance Overall balance assessment: Needs assistance Sitting-balance support: Bilateral upper extremity supported;No upper extremity supported Sitting balance-Leahy Scale: Good     Standing balance support: Bilateral upper extremity supported Standing balance-Leahy Scale: Poor Standing balance comment: Completed 4 standing trials required to clean her up and change her.  Once standing up straight, pt able to maintain WB and balance with heavy reliance on RW.                           ADL either performed or assessed with clinical judgement   ADL Overall ADL's : Needs assistance/impaired Eating/Feeding: Moderate assistance                             Toileting - Clothing Manipulation Details (indicate cue type and reason): focused talking to family about toilet program of setting an alarm and incr the time between toileting as indicated by incontinence events. Recommend use of elastic waist adult briefs. recommending 3n1 for home for chair and bedside bed due to  incontinence       General ADL Comments: pt currently sedate in nature. RN alerated to expressive deficits   Pt educated on bathing and avoid washing directly on incision. Pt educated to use new wash cloth and towel each day. Pt educated to allow water to run across dressing and not to soak in a tub at this time. Pt advised RN will instruct on any bandages required otherwise is open to air.      Vision   Additional Comments: L eye ptosis noted     Perception     Praxis      Pertinent Vitals/Pain Pain Assessment: No/denies  pain     Hand Dominance Right   Extremity/Trunk Assessment Upper Extremity Assessment Upper Extremity Assessment: RUE deficits/detail;Generalized weakness;LUE deficits/detail RUE Deficits / Details: tremor/ daughter reports dropping items more this AM then normal. Unable to sustain attention to jelly per family LUE Deficits / Details: tremor to hand   Lower Extremity Assessment Lower Extremity Assessment: Overall WFL for tasks assessed   Cervical / Trunk Assessment Cervical / Trunk Assessment: Kyphotic   Communication Communication Communication: Expressive difficulties   Cognition Arousal/Alertness: Lethargic;Suspect due to medications Behavior During Therapy:  (sedate) Overall Cognitive Status: Impaired/Different from baseline Area of Impairment: Orientation;Attention;Following commands                 Orientation Level: Disoriented to;Situation Current Attention Level: Focused Memory: Decreased short-term memory;Decreased recall of precautions Following Commands: Follows one step commands inconsistently;Follows one step commands with increased time Safety/Judgement: Decreased awareness of safety;Decreased awareness of deficits   Problem Solving: Slow processing General Comments: pt demonstrates deficits with word finding and expressive deficits. pt unable to repeat "today is Wedneday " initially and even after 2 attempts words remain slurred. pt with L eye droop and deficits opening but able to complete    General Comments  dressing intact and advised not to wash directly on the cut    Exercises     Shoulder Instructions      Home Living Family/patient expects to be discharged to:: Private residence Living Arrangements: Other relatives Available Help at Discharge: Family Type of Home: House       Home Layout: One level     Bathroom Shower/Tub: Occupational psychologist: [place]: Environmental consultant - 2 wheels;Cane - single point;Bedside  commode;Shower seat;Wheelchair - manual   Additional Comments: dgter and sister will help with all dressing / bathing 24/7 (A) upon d/c       Prior Functioning/Environment Level of Independence: Independent with assistive device(s)        Comments: Pt family member stated pt is independent with managing self care and incontinence.        OT Problem List: Decreased strength;Decreased activity tolerance;Impaired balance (sitting and/or standing);Decreased safety awareness;Decreased knowledge of use of DME or AE;Decreased knowledge of precautions;Obesity;Pain      OT Treatment/Interventions: Self-care/ADL training;Therapeutic exercise;DME and/or AE instruction;Therapeutic activities;Balance training;Patient/family education;Cognitive remediation/compensation;Visual/perceptual remediation/compensation    OT Goals(Current goals can be found in the care plan section) Acute Rehab OT Goals OT Goal Formulation: With patient/family Time For Goal Achievement: 12/14/16 Potential to Achieve Goals: Good  OT Frequency: Min 2X/week   Barriers to D/C:            Co-evaluation              End of Session Nurse Communication: Mobility status;Precautions  Activity Tolerance: Patient limited by fatigue;Patient limited by lethargy Patient left:  in chair;with call bell/phone within reach;with family/visitor present  OT Visit Diagnosis: Unsteadiness on feet (R26.81)                Time: 0786-7544 OT Time Calculation (min): 17 min Charges:  OT General Charges $OT Visit: 1 Procedure OT Evaluation $OT Eval Moderate Complexity: 1 Procedure G-Codes:      Jeri Modena   OTR/L Pager: [phone] Office: [phone] .   Parke Poisson B 11/30/2016, 11:34 AM

## 2016-12-01 ENCOUNTER — Telehealth: Payer: Self-pay | Admitting: Neurology

## 2016-12-01 NOTE — Telephone Encounter (Signed)
Patients sister made aware.

## 2016-12-01 NOTE — Telephone Encounter (Signed)
Patient's sister Rod Holler called and would like you to call her to explain how she should be taking her medication. Thanks

## 2016-12-01 NOTE — Telephone Encounter (Signed)
She should be taking her medication as she took it before surgery, correct?

## 2016-12-01 NOTE — Telephone Encounter (Signed)
yes

## 2016-12-02 ENCOUNTER — Encounter (HOSPITAL_COMMUNITY): Payer: Self-pay | Admitting: Emergency Medicine

## 2016-12-02 ENCOUNTER — Observation Stay (HOSPITAL_COMMUNITY)
Admit: 2016-12-02 | Discharge: 2016-12-02 | Disposition: A | Discharge status: Home / Self Care | Payer: Medicare Other | Attending: Family Medicine | Admitting: Family Medicine

## 2016-12-02 ENCOUNTER — Inpatient Hospital Stay (HOSPITAL_COMMUNITY)
Admission: EM | Admit: 2016-12-02 | Discharge: 2016-12-04 | DRG: 057 | Disposition: A | Discharge status: Home Health | Payer: Medicare Other | Attending: Internal Medicine | Admitting: Internal Medicine

## 2016-12-02 ENCOUNTER — Observation Stay (HOSPITAL_COMMUNITY): Payer: Medicare Other

## 2016-12-02 ENCOUNTER — Encounter (HOSPITAL_COMMUNITY): Payer: Self-pay | Admitting: *Deleted

## 2016-12-02 ENCOUNTER — Emergency Department (HOSPITAL_COMMUNITY): Payer: Medicare Other

## 2016-12-02 DIAGNOSIS — G20 Parkinson's disease: Secondary | ICD-10-CM | POA: Diagnosis not present

## 2016-12-02 DIAGNOSIS — Z888 Allergy status to other drugs, medicaments and biological substances status: Secondary | ICD-10-CM

## 2016-12-02 DIAGNOSIS — I951 Orthostatic hypotension: Secondary | ICD-10-CM | POA: Diagnosis present

## 2016-12-02 DIAGNOSIS — I10 Essential (primary) hypertension: Secondary | ICD-10-CM | POA: Diagnosis present

## 2016-12-02 DIAGNOSIS — R443 Hallucinations, unspecified: Secondary | ICD-10-CM | POA: Diagnosis present

## 2016-12-02 DIAGNOSIS — I509 Heart failure, unspecified: Secondary | ICD-10-CM | POA: Diagnosis present

## 2016-12-02 DIAGNOSIS — Z8673 Personal history of transient ischemic attack (TIA), and cerebral infarction without residual deficits: Secondary | ICD-10-CM

## 2016-12-02 DIAGNOSIS — R531 Weakness: Secondary | ICD-10-CM

## 2016-12-02 DIAGNOSIS — E876 Hypokalemia: Secondary | ICD-10-CM | POA: Diagnosis present

## 2016-12-02 DIAGNOSIS — Z886 Allergy status to analgesic agent status: Secondary | ICD-10-CM

## 2016-12-02 DIAGNOSIS — Z87440 Personal history of urinary (tract) infections: Secondary | ICD-10-CM

## 2016-12-02 DIAGNOSIS — J45909 Unspecified asthma, uncomplicated: Secondary | ICD-10-CM | POA: Diagnosis present

## 2016-12-02 DIAGNOSIS — G709 Myoneural disorder, unspecified: Secondary | ICD-10-CM | POA: Diagnosis present

## 2016-12-02 DIAGNOSIS — R296 Repeated falls: Secondary | ICD-10-CM | POA: Diagnosis present

## 2016-12-02 DIAGNOSIS — E039 Hypothyroidism, unspecified: Secondary | ICD-10-CM | POA: Diagnosis present

## 2016-12-02 DIAGNOSIS — Z9989 Dependence on other enabling machines and devices: Secondary | ICD-10-CM

## 2016-12-02 DIAGNOSIS — R32 Unspecified urinary incontinence: Secondary | ICD-10-CM | POA: Diagnosis present

## 2016-12-02 DIAGNOSIS — G20A1 Parkinson's disease without dyskinesia, without mention of fluctuations: Secondary | ICD-10-CM | POA: Diagnosis present

## 2016-12-02 DIAGNOSIS — I110 Hypertensive heart disease with heart failure: Secondary | ICD-10-CM | POA: Diagnosis present

## 2016-12-02 DIAGNOSIS — G4730 Sleep apnea, unspecified: Secondary | ICD-10-CM | POA: Diagnosis present

## 2016-12-02 DIAGNOSIS — R4182 Altered mental status, unspecified: Secondary | ICD-10-CM

## 2016-12-02 DIAGNOSIS — K5900 Constipation, unspecified: Secondary | ICD-10-CM | POA: Diagnosis present

## 2016-12-02 DIAGNOSIS — Z969 Presence of functional implant, unspecified: Secondary | ICD-10-CM | POA: Diagnosis present

## 2016-12-02 DIAGNOSIS — F419 Anxiety disorder, unspecified: Secondary | ICD-10-CM | POA: Diagnosis present

## 2016-12-02 DIAGNOSIS — Z87891 Personal history of nicotine dependence: Secondary | ICD-10-CM

## 2016-12-02 DIAGNOSIS — Z79899 Other long term (current) drug therapy: Secondary | ICD-10-CM

## 2016-12-02 DIAGNOSIS — F329 Major depressive disorder, single episode, unspecified: Secondary | ICD-10-CM | POA: Diagnosis present

## 2016-12-02 DIAGNOSIS — N3281 Overactive bladder: Secondary | ICD-10-CM | POA: Diagnosis present

## 2016-12-02 DIAGNOSIS — E860 Dehydration: Secondary | ICD-10-CM | POA: Diagnosis present

## 2016-12-02 DIAGNOSIS — Z880 Allergy status to penicillin: Secondary | ICD-10-CM

## 2016-12-02 LAB — URINALYSIS, ROUTINE W REFLEX MICROSCOPIC
BACTERIA UA: NONE SEEN
GLUCOSE, UA: NEGATIVE mg/dL
Ketones, ur: 5 mg/dL — AB
LEUKOCYTES UA: NEGATIVE
NITRITE: NEGATIVE
PROTEIN: NEGATIVE mg/dL
SPECIFIC GRAVITY, URINE: 1.026 (ref 1.005–1.030)
Squamous Epithelial / LPF: NONE SEEN
pH: 5 (ref 5.0–8.0)

## 2016-12-02 LAB — CBC WITH DIFFERENTIAL/PLATELET
BASOS PCT: 0 %
Basophils Absolute: 0 10*3/uL (ref 0.0–0.1)
Eosinophils Absolute: 0.1 10*3/uL (ref 0.0–0.7)
Eosinophils Relative: 1 %
HEMATOCRIT: 36.5 % (ref 36.0–46.0)
Hemoglobin: 11.9 g/dL — ABNORMAL LOW (ref 12.0–15.0)
LYMPHS PCT: 15 %
Lymphs Abs: 1.1 10*3/uL (ref 0.7–4.0)
MCH: 30.4 pg (ref 26.0–34.0)
MCHC: 32.6 g/dL (ref 30.0–36.0)
MCV: 93.1 fL (ref 78.0–100.0)
MONO ABS: 0.5 10*3/uL (ref 0.1–1.0)
Monocytes Relative: 7 %
NEUTROS ABS: 5.7 10*3/uL (ref 1.7–7.7)
NEUTROS PCT: 77 %
Platelets: 166 10*3/uL (ref 150–400)
RBC: 3.92 MIL/uL (ref 3.87–5.11)
RDW: 14.2 % (ref 11.5–15.5)
WBC: 7.4 10*3/uL (ref 4.0–10.5)

## 2016-12-02 LAB — COMPREHENSIVE METABOLIC PANEL
ALK PHOS: 74 U/L (ref 38–126)
ALT: <5 U/L — ABNORMAL LOW (ref 14–54)
ANION GAP: 8 (ref 5–15)
AST: 14 U/L — ABNORMAL LOW (ref 15–41)
Albumin: 3.5 g/dL (ref 3.5–5.0)
BILIRUBIN TOTAL: 0.7 mg/dL (ref 0.3–1.2)
BUN: 19 mg/dL (ref 6–20)
CALCIUM: 9.5 mg/dL (ref 8.9–10.3)
CO2: 27 mmol/L (ref 22–32)
Chloride: 105 mmol/L (ref 101–111)
Creatinine, Ser: 0.8 mg/dL (ref 0.44–1.00)
GLUCOSE: 111 mg/dL — AB (ref 65–99)
POTASSIUM: 3.2 mmol/L — AB (ref 3.5–5.1)
Sodium: 140 mmol/L (ref 135–145)
TOTAL PROTEIN: 6.2 g/dL — AB (ref 6.5–8.1)

## 2016-12-02 LAB — TSH: TSH: 3.241 u[IU]/mL (ref 0.350–4.500)

## 2016-12-02 LAB — I-STAT CG4 LACTIC ACID, ED: Lactic Acid, Venous: 1.64 mmol/L (ref 0.5–1.9)

## 2016-12-02 LAB — MAGNESIUM: Magnesium: 1.9 mg/dL (ref 1.7–2.4)

## 2016-12-02 LAB — PHOSPHORUS: Phosphorus: 2.7 mg/dL (ref 2.5–4.6)

## 2016-12-02 LAB — LACTIC ACID, PLASMA: LACTIC ACID, VENOUS: 0.8 mmol/L (ref 0.5–1.9)

## 2016-12-02 MED ORDER — ENTACAPONE 200 MG PO TABS
200.0000 mg | ORAL_TABLET | Freq: Three times a day (TID) | ORAL | Status: DC
  Administered 2016-12-02 – 2016-12-04 (×7): 200 mg via ORAL
  Filled 2016-12-02 (×8): qty 1

## 2016-12-02 MED ORDER — CARBIDOPA-LEVODOPA 25-100 MG PO TABS
2.0000 | ORAL_TABLET | Freq: Three times a day (TID) | ORAL | Status: DC
  Administered 2016-12-02 – 2016-12-03 (×4): 2 via ORAL
  Filled 2016-12-02 (×4): qty 2

## 2016-12-02 MED ORDER — ACETAMINOPHEN 650 MG RE SUPP: 650.0000 mg | Freq: Four times a day (QID) | RECTAL | Status: DC | PRN

## 2016-12-02 MED ORDER — CLONAZEPAM 0.5 MG PO TABS
0.2500 mg | ORAL_TABLET | Freq: Every day | ORAL | Status: DC
  Administered 2016-12-02 – 2016-12-03 (×2): 0.25 mg via ORAL
  Filled 2016-12-02 (×2): qty 1

## 2016-12-02 MED ORDER — ACETAMINOPHEN 325 MG PO TABS: 650.0000 mg | ORAL_TABLET | Freq: Four times a day (QID) | ORAL | Status: DC | PRN

## 2016-12-02 MED ORDER — CITALOPRAM HYDROBROMIDE 10 MG PO TABS
20.0000 mg | ORAL_TABLET | Freq: Every day | ORAL | Status: DC
  Administered 2016-12-02 – 2016-12-04 (×3): 20 mg via ORAL
  Filled 2016-12-02 (×3): qty 2

## 2016-12-02 MED ORDER — ONDANSETRON HCL 4 MG PO TABS: 4.0000 mg | ORAL_TABLET | Freq: Four times a day (QID) | ORAL | Status: DC | PRN

## 2016-12-02 MED ORDER — ONDANSETRON HCL 4 MG/2ML IJ SOLN: 4.0000 mg | Freq: Four times a day (QID) | INTRAMUSCULAR | Status: DC | PRN

## 2016-12-02 MED ORDER — CARBIDOPA-LEVODOPA ER 50-200 MG PO TBCR
1.0000 | EXTENDED_RELEASE_TABLET | Freq: Every day | ORAL | Status: DC
  Administered 2016-12-02 – 2016-12-03 (×2): 1 via ORAL
  Filled 2016-12-02 (×2): qty 1

## 2016-12-02 MED ORDER — VERAPAMIL HCL 80 MG PO TABS
80.0000 mg | ORAL_TABLET | Freq: Three times a day (TID) | ORAL | Status: DC
  Administered 2016-12-02 – 2016-12-04 (×7): 80 mg via ORAL
  Filled 2016-12-02 (×8): qty 1

## 2016-12-02 MED ORDER — POTASSIUM CHLORIDE CRYS ER 20 MEQ PO TBCR
40.0000 meq | EXTENDED_RELEASE_TABLET | Freq: Once | ORAL | Status: AC
  Administered 2016-12-02: 40 meq via ORAL
  Filled 2016-12-02: qty 2

## 2016-12-02 NOTE — ED Notes (Signed)
Pt explained where she is being admitted to. Pt understands and family understands.

## 2016-12-02 NOTE — Progress Notes (Signed)
Routine EEG completed, results pending. 

## 2016-12-02 NOTE — ED Provider Notes (Signed)
[facility] DEPT Provider Note   CSN: [id number] Arrival date & time: 12/02/16  1114     History   Chief Complaint Chief Complaint  Patient presents with  . Post-op Problem    HPI Ana Salazar is a 71 y.o. female.Complains of weakness in both legs since discharge from the hospital 11/30/2016 when a deep brain stimulator was implanted for Parkinson's disease. She denies arm weakness denies pain anywhere and denies fever denies other complaint. She has fallen 3 or 4 times since discharge from the hospital. No injuries will fall. She is also feeling somewhat confused.  HPI  Past Medical History:  Diagnosis Date  . Anemia   . Anxiety    takes Klonopin as bedtime  . Asthma   . CHF (congestive heart failure) ([place])   . Constipation    takes Colace as needed  . Depression    takes Citalopram daily  . Dyspnea   . Hypertension    takes Verapamil daily  . Hypothyroidism   . Kidney disease    has frequent UTS's.  just finishing up antibiotics now.  . Neuromuscular disorder ([place])    parkinson's disease    dx 4 yr ago  . Overactive bladder   . Parkinson's disease ([place])    takes Sinemet daily  . Peripheral edema    take Lasix as needed  . Pneumonia   . Seasonal allergies   . Sleep apnea    tested in [place], 5-6 mths ago.  wears cpap. "sitting on 5"  . Stroke [facility])    "a bunch"   no deficits,  "cleared up".  Last one 4 yr ago  . Tachycardia     Patient Active Problem List   Diagnosis Date Noted  . 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 Parkinson's disease ([place]) 09/28/2015    Past Surgical History:  Procedure Laterality Date  . ABDOMINAL HYSTERECTOMY    . CESAREAN SECTION    . fibroid tumor    . MINOR PLACEMENT OF FIDUCIAL N/A 11/22/2016   Procedure: Fiducial placement;  Surgeon: Erline Levine, MD;  Location: [facility];  Service: Neurosurgery;  Laterality: N/A;  Fiducial placement  . RADIOLOGY WITH ANESTHESIA N/A 09/13/2016   Procedure: MRI BRAIN WITH AND WITHOUT;   Surgeon: Medication Radiologist, MD;  Location: [facility];  Service: Radiology;  Laterality: N/A;  . skin cancer removed left arm    . THYROIDECTOMY, PARTIAL    . TONSILLECTOMY    . TUBAL LIGATION      OB History    No data available       Home Medications    Prior to Admission medications   Medication Sig Start Date End Date Taking? Authorizing Provider  atropine 1 % ophthalmic solution Use as directed Patient taking differently: 1 drop daily. Use as directed in nose For Parkinson's disease 02/02/16   Eustace Quail Tat, DO  carbidopa-levodopa (SINEMET CR) 50-200 MG tablet Take 1 tablet by mouth at bedtime. 09/20/16   Eustace Quail Tat, DO  carbidopa-levodopa (SINEMET IR) 25-100 MG tablet 2 tablets at 7 AM/2 tablet at 10 AM//2 tablets at 1 PM/1 tablet at 4 PM and one tablet at 7 PM Patient taking differently: Take 2 tablets by mouth 3 (three) times daily.  07/25/16   Eustace Quail Tat, DO  cephALEXin (KEFLEX) 250 MG capsule Take 250 mg by mouth 3 (three) times daily.    Historical Provider, MD  cetirizine (ZYRTEC) 10 MG tablet Take 10 mg by mouth daily as needed  for allergies.     Historical Provider, MD  citalopram (CELEXA) 20 MG tablet Take 20 mg by mouth daily.     Historical Provider, MD  clonazePAM (KLONOPIN) 0.5 MG tablet TAKE 1/2 TABLET BY MOUTH AT BEDTIME 09/01/16   Rebecca S Tat, DO  CRANBERRY PO Take 1 tablet by mouth daily.     Historical Provider, MD  docusate sodium (COLACE) 100 MG capsule Take 100 mg by mouth daily as needed for mild constipation.     Historical Provider, MD  entacapone (COMTAN) 200 MG tablet TAKE 1 TABLET THREE TIMES PER DAY 09/20/16   Rebecca S Tat, DO  Fluticasone Furoate (ARNUITY ELLIPTA) 100 MCG/ACT AEPB Inhale 1 puff into the lungs daily.     Historical Provider, MD  furosemide (LASIX) 20 MG tablet TAKE 1/2 TABLET DAILY AS NEEDED FOR SIGNIFICANT FLUID SWELLING ONLY 05/24/16   Historical Provider, MD  potassium chloride (K-DUR,KLOR-CON) 10 MEQ tablet Take 10 mEq by  mouth daily at 12 noon.     Historical Provider, MD  Tetrahydroz-Dextran-PEG-Povid (EYE DROPS ADVANCED RELIEF OP) Apply 1 drop to eye 2 (two) times daily as needed (dry eyes).    Historical Provider, MD  verapamil (CALAN) 80 MG tablet Take 80 mg by mouth 3 (three) times daily.    Historical Provider, MD    Family History No family history on file.  Social History Social History  Substance Use Topics  . Smoking status: Former Smoker    Quit date: 09/27/2012  . Smokeless tobacco: Never Used  . Alcohol use No     Allergies   Amoxicillin; Prednisone; Amlodipine; Levothyroxine; Meloxicam; Nebivolol; Nsaids; and Valsartan   Review of Systems Review of Systems  Musculoskeletal: Positive for gait problem.  Neurological: Positive for weakness.       Bilateral leg weakness     Physical Exam Updated Vital Signs BP (!) 159/68   Pulse (!) 54   Temp 99.4 F (37.4 C)   Resp 13   Ht 5\' 5"  (1.651 m)   Wt 168 lb (76.2 kg)   SpO2 98%   BMI 27.96 kg/m   Physical Exam  Constitutional: She is oriented to person, place, and time. She appears well-developed and well-nourished.  HENT:  Well-healing stapled surgical wounds at  scalp. Otherwise normocephalic atraumatic. No facial asymmetry  Eyes: Conjunctivae are normal. Pupils are equal, round, and reactive to light.  Neck: Neck supple. No tracheal deviation present. No thyromegaly present.  Cardiovascular: Normal rate and regular rhythm.   No murmur heard. Pulmonary/Chest: Effort normal and breath sounds normal.  Abdominal: Soft. Bowel sounds are normal. She exhibits no distension. There is no tenderness.  Musculoskeletal: Normal range of motion. She exhibits no edema or tenderness.  Neurological: She is alert and oriented to person, place, and time. Coordination normal.  Motor strength 5 over 5 overall DTRs symmetric bilaterally at knee jerk ankle jerk biceps was ordered bilaterally  Skin: Skin is warm and dry. No rash noted.    Psychiatric: She has a normal mood and affect.  Nursing note and vitals reviewed.    ED Treatments / Results  Labs (all labs ordered are listed, but only abnormal results are displayed) Labs Reviewed  COMPREHENSIVE METABOLIC PANEL - Abnormal; Notable for the following:       Result Value   Potassium 3.2 (*)    Glucose, Bld 111 (*)    Total Protein 6.2 (*)    AST 14 (*)    ALT <5 (*)  All other components within normal limits  CBC WITH DIFFERENTIAL/PLATELET - Abnormal; Notable for the following:    Hemoglobin 11.9 (*)    All other components within normal limits  URINALYSIS, ROUTINE W REFLEX MICROSCOPIC  I-STAT CG4 LACTIC ACID, ED   Results for orders placed or performed during the hospital encounter of 12/02/16  Comprehensive metabolic panel  Result Value Ref Range   Sodium 140 135 - 145 mmol/L   Potassium 3.2 (L) 3.5 - 5.1 mmol/L   Chloride 105 101 - 111 mmol/L   CO2 27 22 - 32 mmol/L   Glucose, Bld 111 (H) 65 - 99 mg/dL   BUN 19 6 - 20 mg/dL   Creatinine, Ser 0.80 0.44 - 1.00 mg/dL   Calcium 9.5 8.9 - 10.3 mg/dL   Total Protein 6.2 (L) 6.5 - 8.1 g/dL   Albumin 3.5 3.5 - 5.0 g/dL   AST 14 (L) 15 - 41 U/L   ALT <5 (L) 14 - 54 U/L   Alkaline Phosphatase 74 38 - 126 U/L   Total Bilirubin 0.7 0.3 - 1.2 mg/dL   GFR calc non Af Amer >60 >60 mL/min   GFR calc Af Amer >60 >60 mL/min   Anion gap 8 5 - 15  CBC with Differential  Result Value Ref Range   WBC 7.4 4.0 - 10.5 K/uL   RBC 3.92 3.87 - 5.11 MIL/uL   Hemoglobin 11.9 (L) 12.0 - 15.0 g/dL   HCT 36.5 36.0 - 46.0 %   MCV 93.1 78.0 - 100.0 fL   MCH 30.4 26.0 - 34.0 pg   MCHC 32.6 30.0 - 36.0 g/dL   RDW 14.2 11.5 - 15.5 %   Platelets 166 150 - 400 K/uL   Neutrophils Relative % 77 %   Neutro Abs 5.7 1.7 - 7.7 K/uL   Lymphocytes Relative 15 %   Lymphs Abs 1.1 0.7 - 4.0 K/uL   Monocytes Relative 7 %   Monocytes Absolute 0.5 0.1 - 1.0 K/uL   Eosinophils Relative 1 %   Eosinophils Absolute 0.1 0.0 - 0.7 K/uL    Basophils Relative 0 %   Basophils Absolute 0.0 0.0 - 0.1 K/uL  Urinalysis, Routine w reflex microscopic  Result Value Ref Range   Color, Urine AMBER (A) YELLOW   APPearance CLEAR CLEAR   Specific Gravity, Urine 1.026 1.005 - 1.030   pH 5.0 5.0 - 8.0   Glucose, UA NEGATIVE NEGATIVE mg/dL   Hgb urine dipstick SMALL (A) NEGATIVE   Bilirubin Urine SMALL (A) NEGATIVE   Ketones, ur 5 (A) NEGATIVE mg/dL   Protein, ur NEGATIVE NEGATIVE mg/dL   Nitrite NEGATIVE NEGATIVE   Leukocytes, UA NEGATIVE NEGATIVE   RBC / HPF 6-30 0 - 5 RBC/hpf   WBC, UA 0-5 0 - 5 WBC/hpf   Bacteria, UA NONE SEEN NONE SEEN   Squamous Epithelial / LPF NONE SEEN NONE SEEN   Mucous PRESENT    Ca Oxalate Crys, UA PRESENT   I-Stat CG4 Lactic Acid, ED  Result Value Ref Range   Lactic Acid, Venous 1.64 0.5 - 1.9 mmol/L   Ct Head Wo Contrast  Result Date: 12/02/2016 CLINICAL DATA:  Postop deep brain stimulators.  Weakness in legs. EXAM: CT HEAD WITHOUT CONTRAST TECHNIQUE: Contiguous axial images were obtained from the base of the skull through the vertex without intravenous contrast. COMPARISON:  11/29/2016 FINDINGS: Brain: Bifrontal stimulators are noted in place with the tips in the region of the thalami bilaterally. No hemorrhage. No  acute infarction. No hydrocephalus. Small amount of pneumocephalus anteriorly. Vascular: No hyperdense vessel or unexpected calcification. Skull: No acute calvarial abnormality. Sinuses/Orbits: Visualized paranasal sinuses and mastoids clear. Orbital soft tissues unremarkable. Other: None IMPRESSION: Bilateral deep brain stimulators in place entering in the frontal regions bilaterally with the tips in the region of the thalamus. Small amount of pneumocephalus. No hemorrhage. Electronically Signed   By: Rolm Baptise M.D.   On: 12/02/2016 13:04   Ct Head Wo Contrast  Result Date: 11/29/2016 CLINICAL DATA:  Status post deep brain stimulator placement. EXAM: CT HEAD WITHOUT CONTRAST TECHNIQUE:  Contiguous axial images were obtained from the base of the skull through the vertex without intravenous contrast. COMPARISON:  11/22/2016 FINDINGS: Brain: New bilateral deep brain stimulator via burr holes. The leads appear symmetrically placed. Expected pneumocephalus. No intracranial hemorrhage, swelling, or infarct. No intraventricular extension. Vascular: Atherosclerotic calcification. Skull: Burr holes and expected scalp gas and soft tissue swelling. Sinuses/Orbits: Chronic opacification of bilateral mastoid and middle ear spaces. Polyp or retention cyst in the right maxillary antrum. IMPRESSION: No unexpected finding after deep brain stimulator placement. Electronically Signed   By: Monte Fantasia M.D.   On: 11/29/2016 15:41   Ct Head Wo Contrast  Result Date: 11/29/2016 CLINICAL DATA:  71 year old female. Parkinson disease. Preoperative study for deep brain stimulator placement, skull fiducials in place. EXAM: CT HEAD WITHOUT CONTRAST TECHNIQUE: Contiguous axial images were obtained from the base of the skull through the vertex without intravenous contrast. COMPARISON:  Brain MRI 09/13/2016 and earlier. FINDINGS: Brain: Mild motion artifact, but not significantly affecting imaging of the brain parenchyma. Stable cerebral volume. Patchy and confluent cerebral white matter hypodensity corresponding to T2/FLAIR changes on the previous MRIs. No midline shift, ventriculomegaly, mass effect, evidence of mass lesion, intracranial hemorrhage or evidence of cortically based acute infarction. No cortical encephalomalacia. Vascular: Calcified atherosclerosis at the skull base. Skull: Multiple skull fiducials in place. The anterior fiducials are slightly affected by motion artifact. No other acute osseous abnormality. Sinuses/Orbits: Chronic right maxillary mucous retention cyst. Chronic right mastoid effusions. Other: Negative orbit soft tissues. Mild postoperative scalp soft tissue changes from fiducial placement.  IMPRESSION: 1. Study for stereotactic surgical planning with multiple skull fiducials in place. 2. No acute intracranial abnormality. Stable brain with chronic nonspecific white matter changes. Electronically Signed   By: Genevie Ann M.D.   On: 11/22/2016 11:23   EKG  EKG Interpretation None       Radiology No results found.  Procedures Procedures (including critical care time)  Medications Ordered in ED Medications - No data to display  I've consulted Dr. Vertell Limber from neurosurgery who will see patient while in hospital. Cause weakness is likely secondary to deconditioning or medication related. Not likely secondary to postoperative complication. Dr. Vertell Limber suggest overnight stay with medical service. He will see patient while in hospital Initial Impression / Assessment and Plan / ED Course  I have reviewed the triage vital signs and the nursing notes.  Pertinent labs & imaging results that were available during my care of the patient were reviewed by me and considered in my medical decision making (see chart for details).     Hospitalist service consulted and will arrange for overnight stay  Final Clinical Impressions(s) / ED Diagnoses  Diagnosis #1 weakness  #2 hypokalemia Final diagnoses:  None    New Prescriptions New Prescriptions   No medications on file     Orlie Dakin, MD 12/02/16 1401

## 2016-12-02 NOTE — Procedures (Signed)
History: 71 year old female with  Sedation: None  Technique: This is a 21 channel routine scalp EEG performed at the bedside with bipolar and monopolar montages arranged in accordance to the international 10/20 system of electrode placement. One channel was dedicated to EKG recording.    Background: The background consists of intermixed alpha and beta activities. There is a well defined posterior dominant rhythm of 9 Hz that attenuates with eye opening. Sleep is briefly recorded with normal appearing structures.   Photic stimulation: Physiologic driving is now performed  EEG Abnormalities: None  Clinical Interpretation: This normal EEG is recorded in the waking and sleep state. There was no seizure or seizure predisposition recorded on this study. Please note that a normal EEG does not preclude the possibility of epilepsy.   Roland Rack, MD [facility] [phone]  If 7pm- 7am, please page neurology on call as listed in [place].

## 2016-12-02 NOTE — ED Notes (Signed)
Patient transported to CT 

## 2016-12-02 NOTE — ED Triage Notes (Signed)
Family member reports pt had BPS generator implanted in head by dr Vertell Limber on 3/28, states patient has been very weak since and is unable to stand and move around like normal. pts family member spoke with dr Vertell Limber who referred pt to ED for further eval. Pt a/ox4, resp e/u. Large incision to scalp with staples present.

## 2016-12-02 NOTE — ED Notes (Signed)
Pt assisted to bathroom by RN and NT. Pt unable to provide urine sample at this time.

## 2016-12-02 NOTE — H&P (Signed)
History and Physical    Ana Salazar WYO:[id number] DOB: [birth year] DOA: 12/02/2016   PCP: [name], MD   Patient coming from/Resides with: Private residence  Admission status: Observation/floor-it may be medically necessary to stay a minimum 2 midnights to rule out impending and/or unexpected changes in physiologic status that may differ from initial evaluation performed in the ER and/or at time of admission therefore consider reevaluation of admission status 24 hours.   Chief Complaint: Generalized weakness  HPI: Ana Salazar is a 71 y.o. female with medical history significant for hypertension and Parkinson's disease. Patient currently is undergoing a staged process for bilateral deep brain stimulator implantation. Recent procedure was on 3/27 with a discharge date of 3/28. Since discharge patient has had some issues with transient confusion and generalized weakness (primarily involving the legs). She is not having any fevers, chills or other infectious symptoms. She has not had any nausea, vomiting or diarrhea. She has not fallen.  ED Course:  Vital Signs: BP (!) 177/87   Pulse (!) 57   Temp 99.4 F (37.4 C)   Resp (!) 24   Ht 5\' 5"  (1.651 m)   Wt 76.2 kg (168 lb)   SpO2 95%   BMI 27.96 kg/m  CT head without contrast: Expected pneumocephalus without hemorrhage or other acute findings Lab data: Sodium 140, potassium 3.2, chloride 95, CO2 27, glucose 111, BUN 19, creatinine 0.8, LFTs not elevated, lactic acid 1.64, white count 7400 with normal differential, hemoglobin 11.9, platelets 166,000, urinalysis unremarkable except for small none bilirubin, amber color, small hemoglobin, 5 ketones, specific gravity 1.026 Medications and treatments: Potassium 40 mEq 1  Review of Systems:  In addition to the HPI above,  No Fever-chills, myalgias or other constitutional symptoms No Headache, changes with Vision or hearing, tingling, numbness in any extremity, dizziness, dysarthria or  word finding difficulty, gait disturbance or imbalance, no change in long-standing Parkinson's related tremors or no seizure activity No problems swallowing food or Liquids, indigestion/reflux, choking or coughing while eating, abdominal pain with or after eating No Chest pain, Cough or Shortness of Breath, palpitations, orthopnea or DOE No Abdominal pain, N/V, melena,hematochezia, dark tarry stools, constipation No dysuria, malodorous urine, hematuria or flank pain No new skin rashes, lesions, masses or bruises, No new joint pains, aches, swelling or redness No recent unintentional weight gain or loss No polyuria, polydypsia or polyphagia   Past Medical History:  Diagnosis Date  . Anemia   . Anxiety    takes Klonopin as bedtime  . Asthma   . CHF (congestive heart failure) ([place])   . Constipation    takes Colace as needed  . Depression    takes Citalopram daily  . Dyspnea   . Hypertension    takes Verapamil daily  . Hypothyroidism   . Kidney disease    has frequent UTS's.  just finishing up antibiotics now.  . Neuromuscular disorder ([place])    parkinson's disease    dx 4 yr ago  . Overactive bladder   . Parkinson's disease ([place])    takes Sinemet daily  . Peripheral edema    take Lasix as needed  . Pneumonia   . Seasonal allergies   . Sleep apnea    tested in [place], 5-6 mths ago.  wears cpap. "sitting on 5"  . Stroke [facility])    "a bunch"   no deficits,  "cleared up".  Last one 4 yr ago  . Tachycardia     Past Surgical History:  Procedure Laterality Date  . ABDOMINAL HYSTERECTOMY    . CESAREAN SECTION    . fibroid tumor    . MINOR PLACEMENT OF FIDUCIAL N/A 11/22/2016   Procedure: Fiducial placement;  Surgeon: Erline Levine, MD;  Location: [place];  Service: Neurosurgery;  Laterality: N/A;  Fiducial placement  . RADIOLOGY WITH ANESTHESIA N/A 09/13/2016   Procedure: MRI BRAIN WITH AND WITHOUT;  Surgeon: Medication Radiologist, MD;  Location: [facility];  Service: Radiology;   Laterality: N/A;  . skin cancer removed left arm    . SUBTHALAMIC STIMULATOR INSERTION Bilateral 11/29/2016   Procedure: Bilateral Deep brain stimulator placement;  Surgeon: Erline Levine, MD;  Location: [place];  Service: Neurosurgery;  Laterality: Bilateral;  Bilateral Deep brain stimulator placement  . THYROIDECTOMY, PARTIAL    . TONSILLECTOMY    . TUBAL LIGATION      Social History   Social History  . Marital status: Widowed    Spouse name: N/A  . Number of children: N/A  . Years of education: N/A   Occupational History  . retired    Social History Main Topics  . Smoking status: Former Smoker    Quit date: 09/27/2012  . Smokeless tobacco: Never Used  . Alcohol use No  . Drug use: No  . Sexual activity: Not on file   Other Topics Concern  . Not on file   Social History Narrative  . No narrative on file    Mobility: Independent without devices Work history: Retired   Allergies  Allergen Reactions  . Amoxicillin Anaphylaxis, Swelling and Rash    Has patient had a PCN reaction causing immediate rash, facial/tongue/throat swelling, SOB or lightheadedness with hypotension: Yes PCN reaction causing SEVERE RASH INVOLVING MUCUS MEMBRANES or SKIN NECROSIS: Yes Has patient had a PCN reaction that required hospitalization: No Has patient had a PCN reaction occurring within the last 10 years: Yes   . Prednisone Shortness Of Breath and Nausea Only    Hypertension   . Amlodipine Itching and Other (See Comments)    BURNING  . Levothyroxine Other (See Comments)    Tired, shaky, tremors  . Meloxicam Swelling    SWELLING REACTION UNSPECIFIED  "Burning "  . Nebivolol Rash  . Nsaids Nausea And Vomiting  . Valsartan Itching    Family history reviewed and not pertinent to current admission findings are diagnosis   Prior to Admission medications   Medication Sig Start Date End Date Taking? Authorizing Provider  albuterol (PROVENTIL HFA;VENTOLIN HFA) 108 (90 Base) MCG/ACT  inhaler Inhale 1 puff into the lungs every 6 (six) hours as needed for wheezing or shortness of breath.   Yes Historical Provider, MD  carbidopa-levodopa (SINEMET CR) 50-200 MG tablet Take 1 tablet by mouth at bedtime. 09/20/16  Yes Rebecca S Tat, DO  carbidopa-levodopa (SINEMET IR) 25-100 MG tablet 2 tablets at 7 AM/2 tablet at 10 AM//2 tablets at 1 PM/1 tablet at 4 PM and one tablet at 7 PM Patient taking differently: Take 1-2 tablets by mouth See admin instructions. Pt takes 2 tablets at 0700, 1000, 1300 - pt takes 1 tablet at 1600 07/25/16  Yes Rebecca S Tat, DO  citalopram (CELEXA) 20 MG tablet Take 20 mg by mouth daily.    Yes Historical Provider, MD  clonazePAM (KLONOPIN) 0.5 MG tablet TAKE 1/2 TABLET BY MOUTH AT BEDTIME 09/01/16  Yes Rebecca S Tat, DO  CRANBERRY PO Take 1 tablet by mouth daily.    Yes Historical Provider, MD  docusate sodium (  COLACE) 100 MG capsule Take 100 mg by mouth daily as needed for mild constipation.    Yes Historical Provider, MD  entacapone (COMTAN) 200 MG tablet TAKE 1 TABLET THREE TIMES PER DAY 09/20/16  Yes Rebecca S Tat, DO  Fluticasone Furoate (ARNUITY ELLIPTA) 100 MCG/ACT AEPB Inhale 1 puff into the lungs daily.    Yes Historical Provider, MD  furosemide (LASIX) 20 MG tablet TAKE 1/2 TABLET DAILY AS NEEDED FOR SIGNIFICANT FLUID SWELLING ONLY 05/24/16  Yes Historical Provider, MD  potassium chloride (K-DUR,KLOR-CON) 10 MEQ tablet Take 10 mEq by mouth daily at 12 noon.    Yes Historical Provider, MD  verapamil (CALAN) 80 MG tablet Take 80 mg by mouth 3 (three) times daily.   Yes Historical Provider, MD  atropine 1 % ophthalmic solution Use as directed Patient not taking: Reported on 12/02/2016 02/02/16   Eustace Quail Tat, DO    Physical Exam: Vitals:   12/02/16 1230 12/02/16 1300 12/02/16 1330 12/02/16 1400  BP: (!) 174/75 (!) 181/80 (!) 196/81 (!) 177/87  Pulse: (!) 44 (!) 55 (!) 58 (!) 57  Resp: 20 (!) 21 20 (!) 24  Temp:      SpO2: 97% 94% 92% 95%  Weight:       Height:          Constitutional: NAD, calm, comfortable Eyes: PERRL, lids and conjunctivae normal ENMT: Mucous membranes are dry. Posterior pharynx clear of any exudate or lesions.Normal dentition.  Neck: normal, supple, no masses, no thyromegaly Respiratory: clear to auscultation bilaterally, no wheezing, no crackles. Normal respiratory effort. No accessory muscle use.  Cardiovascular: Regular rate and rhythm, no murmurs / rubs / gallops. No extremity edema. 2+ pedal pulses. No carotid bruits.  Abdomen: no tenderness, no masses palpated. No hepatosplenomegaly. Bowel sounds positive.  Musculoskeletal: no clubbing / cyanosis. No joint deformity upper and lower extremities. Good ROM, no contractures. Normal muscle tone.  Skin: no rashes, lesions, ulcers. No induration; staple lines on top of scalp clean dry and intact Neurologic: CN 2-12 grossly intact. Sensation intact, DTR normal. Strength 5/5 x all 4 extremities. No resting tremors observed Psychiatric: Normal judgment and insight. Alert and oriented x 3. Normal mood.    Labs on Admission: I have personally reviewed following labs and imaging studies  CBC:  Recent Labs Lab 11/29/16 0618 12/02/16 1122  WBC 7.3 7.4  NEUTROABS  --  5.7  HGB 13.3 11.9*  HCT 40.7 36.5  MCV 93.8 93.1  PLT 161 235   Basic Metabolic Panel:  Recent Labs Lab 11/29/16 0618 12/02/16 1122  NA 143 140  K 3.4* 3.2*  CL 104 105  CO2 29 27  GLUCOSE 108* 111*  BUN 22* 19  CREATININE 0.81 0.80  CALCIUM 9.7 9.5   GFR: Estimated Creatinine Clearance: 68.7 mL/min (by C-G formula based on SCr of 0.8 mg/dL). Liver Function Tests:  Recent Labs Lab 11/29/16 0618 12/02/16 1122  AST 13* 14*  ALT 9* <5*  ALKPHOS 85 74  BILITOT 0.9 0.7  PROT 6.4* 6.2*  ALBUMIN 3.9 3.5   No results for input(s): LIPASE, AMYLASE in the last 168 hours. No results for input(s): AMMONIA in the last 168 hours. Coagulation Profile: No results for input(s): INR,  PROTIME in the last 168 hours. Cardiac Enzymes: No results for input(s): CKTOTAL, CKMB, CKMBINDEX, TROPONINI in the last 168 hours. BNP (last 3 results) No results for input(s): PROBNP in the last 8760 hours. HbA1C: No results for input(s): HGBA1C in the last  72 hours. CBG: No results for input(s): GLUCAP in the last 168 hours. Lipid Profile: No results for input(s): CHOL, HDL, LDLCALC, TRIG, CHOLHDL, LDLDIRECT in the last 72 hours. Thyroid Function Tests: No results for input(s): TSH, T4TOTAL, FREET4, T3FREE, THYROIDAB in the last 72 hours. Anemia Panel: No results for input(s): VITAMINB12, FOLATE, FERRITIN, TIBC, IRON, RETICCTPCT in the last 72 hours. Urine analysis:    Component Value Date/Time   COLORURINE AMBER (A) 12/02/2016 1221   APPEARANCEUR CLEAR 12/02/2016 1221   LABSPEC 1.026 12/02/2016 1221   PHURINE 5.0 12/02/2016 1221   GLUCOSEU NEGATIVE 12/02/2016 1221   HGBUR SMALL (A) 12/02/2016 1221   BILIRUBINUR SMALL (A) 12/02/2016 1221   KETONESUR 5 (A) 12/02/2016 1221   PROTEINUR NEGATIVE 12/02/2016 1221   NITRITE NEGATIVE 12/02/2016 1221   LEUKOCYTESUR NEGATIVE 12/02/2016 1221   Sepsis Labs: @LABRCNTIP (procalcitonin:4,lacticidven:4) )No results found for this or any previous visit (from the past 240 hour(s)).   Radiological Exams on Admission: Ct Head Wo Contrast  Result Date: 12/02/2016 CLINICAL DATA:  Postop deep brain stimulators.  Weakness in legs. EXAM: CT HEAD WITHOUT CONTRAST TECHNIQUE: Contiguous axial images were obtained from the base of the skull through the vertex without intravenous contrast. COMPARISON:  11/29/2016 FINDINGS: Brain: Bifrontal stimulators are noted in place with the tips in the region of the thalami bilaterally. No hemorrhage. No acute infarction. No hydrocephalus. Small amount of pneumocephalus anteriorly. Vascular: No hyperdense vessel or unexpected calcification. Skull: No acute calvarial abnormality. Sinuses/Orbits: Visualized paranasal  sinuses and mastoids clear. Orbital soft tissues unremarkable. Other: None IMPRESSION: Bilateral deep brain stimulators in place entering in the frontal regions bilaterally with the tips in the region of the thalamus. Small amount of pneumocephalus. No hemorrhage. Electronically Signed   By: Rolm Baptise M.D.   On: 12/02/2016 13:04     Assessment/Plan Principal Problem:   Generalized weakness/underlying Parkinson's disease  -Presents with reports of generalized weakness primarily with standing and involving the legs without witnessed seizure activity or any focal neurological findings on exam with negative CT of the head-symptoms have developed since surgical procedure for implantation of brain stimulator -Neurosurgery/[facility] aware of patient's admission and will see patient in consultation later today -Borderline elevated urine specific gravity so concern over dehydration so hold home Lasix and check orthostatic vital signs -? Atypical UTI (symptoms could be masked by current use of Keflex prior to admission)-obtain urine culture-recently treated on 3/6 for Klebsiella UTI resistant to ampicillin -? Atypical seizure activity-check EEG -Continue preadmission Sinemet and Comtan -Continue preadmission medications for symptomatic management: Celexa and Klonopin -PT/OT evaluation -?? Symptoms related to unexpected response to sedatives/anesthetic agents given during procedure -Check TSH -Check CXR  Active Problems:   Acute hypokalemia -Oral potassium given in ER -Labs in a.m. -Check magnesium and phosphorus    Uncontrolled hypertension -Continue preadmission Calan -Holding Lasix due to suspected dehydration      DVT prophylaxis: SCDs  Code Status: Full  Family Communication: Family at bedside Disposition Plan: Home Consults called: Neurosurgery/[facility]     Samella Parr ANP-BC [facility] Pager [phone]   If 7PM-7AM, please contact  night-coverage [email or web address] Password [email or web address]  12/02/2016, 2:18 PM

## 2016-12-02 NOTE — Progress Notes (Signed)
Pt admitted to the unit from ED; pt A&O x4; MAE x4; pt oriented to the unit and room; fall/safety precaution and prevention education completed with pt. Pt skin intact with no pressure or open wounds except bilateral old bruises to arms and hands; pt head has staples clean dry and intact to frontal and posterior head. Pt resting in bed with call light within reach. Will closely monitor. Delia Heady RN

## 2016-12-02 NOTE — Consult Note (Signed)
Reason for Consult:confusion, falling, incontinence Referring Physician: Yamilett Anastos is an 71 y.o. female.  HPI: Patient was recently hospitalized for bilateral DBS STN electrode placement for management of Parkinson's.  She was discharged home and was doing well on POD 1.  I was called today by her sister that patient was falling, incontinent and hallucinating.  She had an unremarkable head CT which showed expected postop findings.  Patient states that she has been exhausted and confused as a result of that.  She also says the incontinence is not new, but that she has worn a pad in the past, but has not been able to do so.  She says she was talking about the dog next door and said he had puppies.  She realizes this is incorrect.  I met with patient and her daughter.  Patient is sitting up in bed and speaking lucidly.  Her incisions appear to be well-opposed and healing well.    Past Medical History:  Diagnosis Date  . Anemia   . Anxiety    takes Klonopin as bedtime  . Asthma   . CHF (congestive heart failure) ([place])   . Constipation    takes Colace as needed  . Depression    takes Citalopram daily  . Dyspnea   . Hypertension    takes Verapamil daily  . Hypothyroidism   . Kidney disease    has frequent UTS's.  just finishing up antibiotics now.  . Neuromuscular disorder ([place])    parkinson's disease    dx 4 yr ago  . Overactive bladder   . Parkinson's disease ([place])    takes Sinemet daily  . Peripheral edema    take Lasix as needed  . Pneumonia   . Seasonal allergies   . Sleep apnea    tested in [place], 5-6 mths ago.  wears cpap. "sitting on 5"  . Stroke [facility])    "a bunch"   no deficits,  "cleared up".  Last one 4 yr ago  . Tachycardia     Past Surgical History:  Procedure Laterality Date  . ABDOMINAL HYSTERECTOMY    . CESAREAN SECTION    . fibroid tumor    . MINOR PLACEMENT OF FIDUCIAL N/A 11/22/2016   Procedure: Fiducial placement;  Surgeon: Erline Levine,  MD;  Location: [facility];  Service: Neurosurgery;  Laterality: N/A;  Fiducial placement  . RADIOLOGY WITH ANESTHESIA N/A 09/13/2016   Procedure: MRI BRAIN WITH AND WITHOUT;  Surgeon: Medication Radiologist, MD;  Location: [facility];  Service: Radiology;  Laterality: N/A;  . skin cancer removed left arm    . SUBTHALAMIC STIMULATOR INSERTION Bilateral 11/29/2016   Procedure: Bilateral Deep brain stimulator placement;  Surgeon: Erline Levine, MD;  Location: [facility];  Service: Neurosurgery;  Laterality: Bilateral;  Bilateral Deep brain stimulator placement  . THYROIDECTOMY, PARTIAL    . TONSILLECTOMY    . TUBAL LIGATION      No family history on file.  Social History:  reports that she quit smoking about 4 years ago. She has never used smokeless tobacco. She reports that she does not drink alcohol or use drugs.  Allergies:  Allergies  Allergen Reactions  . Amoxicillin Anaphylaxis, Swelling and Rash    Has patient had a PCN reaction causing immediate rash, facial/tongue/throat swelling, SOB or lightheadedness with hypotension: Yes PCN reaction causing SEVERE RASH INVOLVING MUCUS MEMBRANES or SKIN NECROSIS: Yes Has patient had a PCN reaction that required hospitalization: No Has patient had a PCN reaction  occurring within the last 10 years: Yes   . Prednisone Shortness Of Breath and Nausea Only    Hypertension   . Amlodipine Itching and Other (See Comments)    BURNING  . Levothyroxine Other (See Comments)    Tired, shaky, tremors  . Meloxicam Swelling    SWELLING REACTION UNSPECIFIED  "Burning "  . Nebivolol Rash  . Nsaids Nausea And Vomiting  . Valsartan Itching    Medications: I have reviewed the patient's current medications.  Results for orders placed or performed during the hospital encounter of 12/02/16 (from the past 48 hour(s))  Comprehensive metabolic panel     Status: Abnormal   Collection Time: 12/02/16 11:22 AM  Result Value Ref Range   Sodium 140 135 - 145 mmol/L   Potassium 3.2  (L) 3.5 - 5.1 mmol/L   Chloride 105 101 - 111 mmol/L   CO2 27 22 - 32 mmol/L   Glucose, Bld 111 (H) 65 - 99 mg/dL   BUN 19 6 - 20 mg/dL   Creatinine, Ser 0.80 0.44 - 1.00 mg/dL   Calcium 9.5 8.9 - 10.3 mg/dL   Total Protein 6.2 (L) 6.5 - 8.1 g/dL   Albumin 3.5 3.5 - 5.0 g/dL   AST 14 (L) 15 - 41 U/L   ALT <5 (L) 14 - 54 U/L   Alkaline Phosphatase 74 38 - 126 U/L   Total Bilirubin 0.7 0.3 - 1.2 mg/dL   GFR calc non Af Amer >60 >60 mL/min   GFR calc Af Amer >60 >60 mL/min    Comment: (NOTE) The eGFR has been calculated using the CKD EPI equation. This calculation has not been validated in all clinical situations. eGFR's persistently <60 mL/min signify possible Chronic Kidney Disease.    Anion gap 8 5 - 15  CBC with Differential     Status: Abnormal   Collection Time: 12/02/16 11:22 AM  Result Value Ref Range   WBC 7.4 4.0 - 10.5 K/uL   RBC 3.92 3.87 - 5.11 MIL/uL   Hemoglobin 11.9 (L) 12.0 - 15.0 g/dL   HCT 36.5 36.0 - 46.0 %   MCV 93.1 78.0 - 100.0 fL   MCH 30.4 26.0 - 34.0 pg   MCHC 32.6 30.0 - 36.0 g/dL   RDW 14.2 11.5 - 15.5 %   Platelets 166 150 - 400 K/uL   Neutrophils Relative % 77 %   Neutro Abs 5.7 1.7 - 7.7 K/uL   Lymphocytes Relative 15 %   Lymphs Abs 1.1 0.7 - 4.0 K/uL   Monocytes Relative 7 %   Monocytes Absolute 0.5 0.1 - 1.0 K/uL   Eosinophils Relative 1 %   Eosinophils Absolute 0.1 0.0 - 0.7 K/uL   Basophils Relative 0 %   Basophils Absolute 0.0 0.0 - 0.1 K/uL  I-Stat CG4 Lactic Acid, ED     Status: None   Collection Time: 12/02/16 12:06 PM  Result Value Ref Range   Lactic Acid, Venous 1.64 0.5 - 1.9 mmol/L  Urinalysis, Routine w reflex microscopic     Status: Abnormal   Collection Time: 12/02/16 12:21 PM  Result Value Ref Range   Color, Urine AMBER (A) YELLOW    Comment: BIOCHEMICALS MAY BE AFFECTED BY COLOR   APPearance CLEAR CLEAR   Specific Gravity, Urine 1.026 1.005 - 1.030   pH 5.0 5.0 - 8.0   Glucose, UA NEGATIVE NEGATIVE mg/dL   Hgb urine  dipstick SMALL (A) NEGATIVE   Bilirubin Urine SMALL (A) NEGATIVE  Ketones, ur 5 (A) NEGATIVE mg/dL   Protein, ur NEGATIVE NEGATIVE mg/dL   Nitrite NEGATIVE NEGATIVE   Leukocytes, UA NEGATIVE NEGATIVE   RBC / HPF 6-30 0 - 5 RBC/hpf   WBC, UA 0-5 0 - 5 WBC/hpf   Bacteria, UA NONE SEEN NONE SEEN   Squamous Epithelial / LPF NONE SEEN NONE SEEN   Mucous PRESENT    Ca Oxalate Crys, UA PRESENT   Magnesium     Status: None   Collection Time: 12/02/16  1:57 PM  Result Value Ref Range   Magnesium 1.9 1.7 - 2.4 mg/dL  Phosphorus     Status: None   Collection Time: 12/02/16  1:57 PM  Result Value Ref Range   Phosphorus 2.7 2.5 - 4.6 mg/dL  Lactic acid, plasma     Status: None   Collection Time: 12/02/16  2:07 PM  Result Value Ref Range   Lactic Acid, Venous 0.8 0.5 - 1.9 mmol/L  TSH     Status: None   Collection Time: 12/02/16  2:39 PM  Result Value Ref Range   TSH 3.241 0.350 - 4.500 uIU/mL    Comment: Performed by a 3rd Generation assay with a functional sensitivity of <=0.01 uIU/mL.    Ct Head Wo Contrast  Result Date: 12/02/2016 CLINICAL DATA:  Postop deep brain stimulators.  Weakness in legs. EXAM: CT HEAD WITHOUT CONTRAST TECHNIQUE: Contiguous axial images were obtained from the base of the skull through the vertex without intravenous contrast. COMPARISON:  11/29/2016 FINDINGS: Brain: Bifrontal stimulators are noted in place with the tips in the region of the thalami bilaterally. No hemorrhage. No acute infarction. No hydrocephalus. Small amount of pneumocephalus anteriorly. Vascular: No hyperdense vessel or unexpected calcification. Skull: No acute calvarial abnormality. Sinuses/Orbits: Visualized paranasal sinuses and mastoids clear. Orbital soft tissues unremarkable. Other: None IMPRESSION: Bilateral deep brain stimulators in place entering in the frontal regions bilaterally with the tips in the region of the thalamus. Small amount of pneumocephalus. No hemorrhage. Electronically  Signed   By: Rolm Baptise M.D.   On: 12/02/2016 13:04   Dg Chest Port 1 View  Result Date: 12/02/2016 CLINICAL DATA:  Weakness starting last week, history of pneumonia EXAM: PORTABLE CHEST 1 VIEW COMPARISON:  10/14/2014 FINDINGS: Cardiomediastinal silhouette is stable. No infiltrate or pleural effusion. No pulmonary edema. Bony thorax is unremarkable. IMPRESSION: No active disease. Electronically Signed   By: Lahoma Crocker M.D.   On: 12/02/2016 14:33    Review of Systems - Negative except CHF, fatigue, dyspnea, weakneess, confusion    Blood pressure (!) 147/79, pulse 60, temperature 98.4 F (36.9 C), temperature source Oral, resp. rate 18, height '5\' 5"'  (1.651 m), weight 77.5 kg (170 lb 14.4 oz), SpO2 98 %. Physical Exam  Constitutional: She is oriented to person, place, and time. She appears well-developed and well-nourished.  HENT:  Head: Normocephalic. Head is with contusion.  Eyes: Conjunctivae and EOM are normal. Pupils are equal, round, and reactive to light.  Neck: Trachea normal and normal range of motion. Neck supple.  Neurological: She is alert and oriented to person, place, and time. She exhibits abnormal muscle tone. GCS eye subscore is 4. GCS verbal subscore is 5. GCS motor subscore is 6.  Patient has healing scalp incisions and some expected bruising.  She is unsteady on her feet.  Parkinsonian rigidity typical for patient.    Assessment/Plan: Patient had episode of confusion, weakness, falling and incontinence.  She was admitted to rule out a more serious  medical cause.  She appears to be doing well at present.  She would benefit from some PT.  Urine sent for UA.  Patient is scheduled for surgery on this coming Tuesday to place implantable pulse generator.  Depending on how she does, she can stay in hospital prior to surgery or be discharged home and readmitted if medical workup negative nad patient does well with PT assessment.  Peggyann Shoals, MD 12/02/2016, 5:06 PM

## 2016-12-03 ENCOUNTER — Inpatient Hospital Stay (HOSPITAL_COMMUNITY): Payer: Medicare Other

## 2016-12-03 DIAGNOSIS — R32 Unspecified urinary incontinence: Secondary | ICD-10-CM | POA: Diagnosis present

## 2016-12-03 DIAGNOSIS — I509 Heart failure, unspecified: Secondary | ICD-10-CM | POA: Diagnosis present

## 2016-12-03 DIAGNOSIS — G20 Parkinson's disease: Secondary | ICD-10-CM | POA: Diagnosis not present

## 2016-12-03 DIAGNOSIS — Z9989 Dependence on other enabling machines and devices: Secondary | ICD-10-CM | POA: Diagnosis not present

## 2016-12-03 DIAGNOSIS — F329 Major depressive disorder, single episode, unspecified: Secondary | ICD-10-CM | POA: Diagnosis present

## 2016-12-03 DIAGNOSIS — I110 Hypertensive heart disease with heart failure: Secondary | ICD-10-CM | POA: Diagnosis present

## 2016-12-03 DIAGNOSIS — R4182 Altered mental status, unspecified: Secondary | ICD-10-CM | POA: Diagnosis present

## 2016-12-03 DIAGNOSIS — E876 Hypokalemia: Secondary | ICD-10-CM

## 2016-12-03 DIAGNOSIS — K5900 Constipation, unspecified: Secondary | ICD-10-CM | POA: Diagnosis present

## 2016-12-03 DIAGNOSIS — G709 Myoneural disorder, unspecified: Secondary | ICD-10-CM | POA: Diagnosis present

## 2016-12-03 DIAGNOSIS — R443 Hallucinations, unspecified: Secondary | ICD-10-CM | POA: Diagnosis present

## 2016-12-03 DIAGNOSIS — R4020 Unspecified coma: Secondary | ICD-10-CM | POA: Diagnosis not present

## 2016-12-03 DIAGNOSIS — Z87891 Personal history of nicotine dependence: Secondary | ICD-10-CM | POA: Diagnosis not present

## 2016-12-03 DIAGNOSIS — N3281 Overactive bladder: Secondary | ICD-10-CM | POA: Diagnosis present

## 2016-12-03 DIAGNOSIS — Z8673 Personal history of transient ischemic attack (TIA), and cerebral infarction without residual deficits: Secondary | ICD-10-CM | POA: Diagnosis not present

## 2016-12-03 DIAGNOSIS — E039 Hypothyroidism, unspecified: Secondary | ICD-10-CM | POA: Diagnosis present

## 2016-12-03 DIAGNOSIS — R531 Weakness: Secondary | ICD-10-CM | POA: Diagnosis not present

## 2016-12-03 DIAGNOSIS — R296 Repeated falls: Secondary | ICD-10-CM | POA: Diagnosis present

## 2016-12-03 DIAGNOSIS — Z969 Presence of functional implant, unspecified: Secondary | ICD-10-CM | POA: Diagnosis present

## 2016-12-03 DIAGNOSIS — Z87440 Personal history of urinary (tract) infections: Secondary | ICD-10-CM | POA: Diagnosis not present

## 2016-12-03 DIAGNOSIS — I951 Orthostatic hypotension: Secondary | ICD-10-CM | POA: Diagnosis present

## 2016-12-03 DIAGNOSIS — E860 Dehydration: Secondary | ICD-10-CM | POA: Diagnosis present

## 2016-12-03 DIAGNOSIS — J45909 Unspecified asthma, uncomplicated: Secondary | ICD-10-CM | POA: Diagnosis present

## 2016-12-03 DIAGNOSIS — G4730 Sleep apnea, unspecified: Secondary | ICD-10-CM | POA: Diagnosis present

## 2016-12-03 DIAGNOSIS — F419 Anxiety disorder, unspecified: Secondary | ICD-10-CM | POA: Diagnosis present

## 2016-12-03 LAB — CBC
HEMATOCRIT: 33.1 % — AB (ref 36.0–46.0)
HEMOGLOBIN: 10.8 g/dL — AB (ref 12.0–15.0)
MCH: 30.3 pg (ref 26.0–34.0)
MCHC: 32.6 g/dL (ref 30.0–36.0)
MCV: 92.7 fL (ref 78.0–100.0)
Platelets: 136 10*3/uL — ABNORMAL LOW (ref 150–400)
RBC: 3.57 MIL/uL — ABNORMAL LOW (ref 3.87–5.11)
RDW: 14.1 % (ref 11.5–15.5)
WBC: 5.5 10*3/uL (ref 4.0–10.5)

## 2016-12-03 LAB — COMPREHENSIVE METABOLIC PANEL
ALBUMIN: 3.1 g/dL — AB (ref 3.5–5.0)
ALK PHOS: 71 U/L (ref 38–126)
ALT: <5 U/L — ABNORMAL LOW (ref 14–54)
ANION GAP: 6 (ref 5–15)
AST: 9 U/L — ABNORMAL LOW (ref 15–41)
BILIRUBIN TOTAL: 0.7 mg/dL (ref 0.3–1.2)
BUN: 19 mg/dL (ref 6–20)
CO2: 29 mmol/L (ref 22–32)
CREATININE: 0.64 mg/dL (ref 0.44–1.00)
Calcium: 9.2 mg/dL (ref 8.9–10.3)
Chloride: 104 mmol/L (ref 101–111)
GLUCOSE: 95 mg/dL (ref 65–99)
POTASSIUM: 3.4 mmol/L — AB (ref 3.5–5.1)
Sodium: 139 mmol/L (ref 135–145)
TOTAL PROTEIN: 5.6 g/dL — AB (ref 6.5–8.1)

## 2016-12-03 LAB — URINE CULTURE: Culture: NO GROWTH

## 2016-12-03 LAB — SURGICAL PCR SCREEN
MRSA, PCR: NEGATIVE
Staphylococcus aureus: NEGATIVE

## 2016-12-03 MED ORDER — SODIUM CHLORIDE 0.9 % IV SOLN
INTRAVENOUS | Status: DC
  Administered 2016-12-03: 13:00:00 via INTRAVENOUS

## 2016-12-03 MED ORDER — CARBIDOPA-LEVODOPA 25-100 MG PO TABS
1.0000 | ORAL_TABLET | Freq: Three times a day (TID) | ORAL | Status: DC
  Administered 2016-12-04 (×3): 1 via ORAL
  Filled 2016-12-03 (×3): qty 1

## 2016-12-03 MED ORDER — CARBIDOPA-LEVODOPA 25-100 MG PO TABS
1.0000 | ORAL_TABLET | Freq: Two times a day (BID) | ORAL | Status: DC
  Filled 2016-12-03: qty 1

## 2016-12-03 MED ORDER — POTASSIUM CHLORIDE CRYS ER 20 MEQ PO TBCR
40.0000 meq | EXTENDED_RELEASE_TABLET | Freq: Once | ORAL | Status: AC
  Administered 2016-12-03: 40 meq via ORAL
  Filled 2016-12-03: qty 2

## 2016-12-03 MED ORDER — MIDODRINE HCL 5 MG PO TABS
5.0000 mg | ORAL_TABLET | Freq: Three times a day (TID) | ORAL | Status: DC
  Administered 2016-12-04 (×2): 5 mg via ORAL
  Filled 2016-12-03 (×2): qty 1

## 2016-12-03 NOTE — Pre-Procedure Instructions (Signed)
Rogina Schiano  12/03/2016       Your procedure is scheduled on April 3.  Report to [facility] Admitting at 05:30 A.M.  Call this number if you have problems the morning of surgery:  [phone]   Remember:  Do not eat food or drink liquids after midnight.  Take these medicines the morning of surgery with A SIP OF WATER: Albuterol, Sinement, Citalopram, Fluticasone, Comtan   STOP/ Do not take Aspirin, Aleve, Naproxen, Advil, Ibuprofen, Motrin, Vitamins, Herbs, or Supplements starting today   STOP Cranberry today   Do not wear jewelry, make-up or nail polish.  Do not wear lotions, powders, or perfumes, or deoderant.  Do not shave 48 hours prior to surgery.  Men may shave face and neck.  Do not bring valuables to the hospital.  [facility] is not responsible for any belongings or valuables.  Contacts, dentures or bridgework may not be worn into surgery.  Leave your suitcase in the car.  After surgery it may be brought to your room.  For patients admitted to the hospital, discharge time will be determined by your treatment team.  Patients discharged the day of surgery will not be allowed to drive home.   [facility] - Preparing for Surgery  Before surgery, you can play an important role.  Because skin is not sterile, your skin needs to be as free of germs as possible.  You can reduce the number of germs on you skin by washing with CHG (chlorahexidine gluconate) soap before surgery.  CHG is an antiseptic cleaner which kills germs and bonds with the skin to continue killing germs even after washing.  Please DO NOT use if you have an allergy to CHG or antibacterial soaps.  If your skin becomes reddened/irritated stop using the CHG and inform your nurse when you arrive at Short Stay.  Do not shave (including legs and underarms) for at least 48 hours prior to the first CHG shower.  You may shave your face.  Please follow these instructions carefully:   1.  Shower with  CHG Soap the night before surgery and the morning of Surgery.  2.  If you choose to wash your hair, wash your hair first as usual with your normal shampoo.  3.  After you shampoo, rinse your hair and body thoroughly to remove the shampoo.  4.  Use CHG as you would any other liquid soap.  You can apply CHG directly to the skin and wash gently with scrungie or a clean washcloth.  5.  Apply the CHG Soap to your body ONLY FROM THE NECK DOWN.  Do not use on open wounds or open sores.  Avoid contact with your eyes, ears, mouth and genitals (private parts).  Wash genitals (private parts) with your normal soap.  6.  Wash thoroughly, paying special attention to the area where your surgery will be performed.  7.  Thoroughly rinse your body with warm water from the neck down.  8.  DO NOT shower/wash with your normal soap after using and rinsing off the CHG Soap.  9.  Pat yourself dry with a clean towel.            10.  Wear clean pajamas.            11.  Place clean sheets on your bed the night of your first shower and do not sleep with pets.  Day of Surgery  Do not apply any lotions the morning  of surgery.  Please wear clean clothes to the hospital/surgery center.  Please read over the following fact sheets that you were given.

## 2016-12-03 NOTE — Evaluation (Signed)
Physical Therapy Evaluation Patient Details Name: Ana Salazar MRN: [id number] DOB: 08-21-1946 Today's Date: 12/03/2016   History of Present Illness  Patient is a 71 y/o female with bilateral DBS placed 3/28 presents from home with falls, weakness, incontinence and hallucinations. Head CT-unremarkable. EEG normal. Plan for surgery on 4/3 for implantable pulse generator. PMH includes PD, CVA, HTN, depression, CHF, asthma.  Clinical Impression  Patient presents with decreased functional strength BLEs, orthostasis (see below), impaired balance, slowed processing and impaired mobility s/p above. Tolerated short distance ambulation within room with min guard assist for safety due to orthostasis and weakness. Pt active and going to the gym prior to DBS surgery and eager to return to this. At this time, recommend HHPT to maximize independence, mobility and decrease falls at home to help transition to gym program. Recommend supervision for OOB mobility and use of RW for stability until functional strength improves. Will follow acutely.  Sitting BP 144/96  Standing BP 118/64 Sitting BP post ambulation 132/66    Follow Up Recommendations Supervision/Assistance - 24 hour    Equipment Recommendations  None recommended by PT    Recommendations for Other Services OT consult     Precautions / Restrictions Precautions Precautions: Fall Precaution Comments: Hx of falls- worsened since DBS surgery Restrictions Weight Bearing Restrictions: No      Mobility  Bed Mobility               General bed mobility comments: Up in chair upon PT arrival.   Transfers Overall transfer level: Needs assistance Equipment used: None Transfers: Sit to/from Stand Sit to Stand: Supervision         General transfer comment: Supervision for safety. Stood from chair x3. No dizziness reported.   Ambulation/Gait Ambulation/Gait assistance: Min guard Ambulation Distance (Feet): 30 Feet Assistive device:  None Gait Pattern/deviations: Shuffle;Trunk flexed;Narrow base of support;Decreased dorsiflexion - right;Decreased dorsiflexion - left Gait velocity: decreased Gait velocity interpretation: Below normal speed for age/gender General Gait Details: Slow, shuffling like gait pattern with increased knee flexion throughout. Bil knee instability but no buckling noted. Will try RW next session for support.  Stairs            Wheelchair Mobility    Modified Rankin (Stroke Patients Only)       Balance Overall balance assessment: Needs assistance Sitting-balance support: Feet supported;No upper extremity supported Sitting balance-Leahy Scale: Good     Standing balance support: During functional activity Standing balance-Leahy Scale: Fair Standing balance comment: Able to stand unsupported without UE support with no sway.                             Pertinent Vitals/Pain Pain Assessment: No/denies pain    Home Living Family/patient expects to be discharged to:: Private residence Living Arrangements: Other relatives (sister and daughter) Available Help at Discharge: Family;Available 24 hours/day ("most of the time" they are there) Type of Home: House Home Access: Stairs to enter Entrance Stairs-Rails: Right Entrance Stairs-Number of Steps: 2 Home Layout: One level Home Equipment: Walker - 2 wheels;Cane - single point;Bedside commode;Shower seat;Wheelchair - manual      Prior Function Level of Independence: Independent with assistive device(s)         Comments: Reports independence with ADls, does not do any IADLs. Drives. Grocery shops. Goes to the gym 3 days/week prior to recent surgery- walks, weights, punching bags. Reports multiple falls since surgery with LEs "giving out."  Hand Dominance   Dominant Hand: Right    Extremity/Trunk Assessment   Upper Extremity Assessment Upper Extremity Assessment: Defer to OT evaluation (No resting tremors noted.  Some mild intention tremor present LUE during finger to nose testing but much improved.)    Lower Extremity Assessment Lower Extremity Assessment: LLE deficits/detail;RLE deficits/detail (Grossly ~4/5 throughout with MMT but increased knee flexion throughout gait during functional assessment.) RLE Sensation:  (WFL) LLE Sensation:  (WFL)    Cervical / Trunk Assessment Cervical / Trunk Assessment: Kyphotic  Communication   Communication: Expressive difficulties  Cognition Arousal/Alertness: Awake/alert Behavior During Therapy: WFL for tasks assessed/performed Overall Cognitive Status: Impaired/Different from baseline Area of Impairment: Memory;Following commands;Safety/judgement;Attention                   Current Attention Level: Selective Memory: Decreased short-term memory;Decreased recall of precautions Following Commands: Follows multi-step commands with increased time     Problem Solving: Slow processing General Comments: Some word finding difficulties and delayed processing but A&O x4. Difficulty relying sequence of events since surgery a few days ago.      General Comments General comments (skin integrity, edema, etc.): Sitting BP 144/96, Standing BP 118/64, Sitting BP post ambulation 132/66    Exercises     Assessment/Plan    PT Assessment Patient needs continued PT services  PT Problem List Decreased strength;Decreased balance;Decreased mobility;Decreased coordination;Decreased cognition;Decreased knowledge of use of DME;Decreased safety awareness;Decreased knowledge of precautions;Cardiopulmonary status limiting activity       PT Treatment Interventions DME instruction;Gait training;Stair training;Functional mobility training;Therapeutic activities;Therapeutic exercise;Balance training;Neuromuscular re-education;Cognitive remediation;Patient/family education    PT Goals (Current goals can be found in the Care Plan section)  Acute Rehab PT Goals Patient  Stated Goal: to return to the gym and go home today PT Goal Formulation: With patient Time For Goal Achievement: 12/17/16 Potential to Achieve Goals: Good    Frequency Min 3X/week   Barriers to discharge Decreased caregiver support not sure how much support daughter/sister can provide    Co-evaluation               End of Session Equipment Utilized During Treatment: Gait belt Activity Tolerance: Treatment limited secondary to medical complications (Comment) (orthostatic) Patient left: in chair;with call bell/phone within reach;with chair alarm set Nurse Communication: Mobility status PT Visit Diagnosis: Unsteadiness on feet (R26.81);Other abnormalities of gait and mobility (R26.89)    Time: 9242-6834 PT Time Calculation (min) (ACUTE ONLY): 24 min   Charges:   PT Evaluation $PT Eval Low Complexity: 1 Procedure PT Treatments $Therapeutic Activity: 8-22 mins   PT G Codes:   PT G-Codes **NOT FOR INPATIENT CLASS** Functional Assessment Tool Used: Clinical judgement Functional Limitation: Mobility: Walking and moving around Mobility: Walking and Moving Around Current Status (H9622): At least 20 percent but less than 40 percent impaired, limited or restricted Mobility: Walking and Moving Around Goal Status [phone]): At least 1 percent but less than 20 percent impaired, limited or restricted    [place], [place], [place] [phone]    Lacie Draft 12/03/2016, 9:45 AM

## 2016-12-03 NOTE — Progress Notes (Signed)
rn went to do rounds and pt was found to be lethargic. arousable with sternal rub. Pt able to follow commands but not speak. MD notified. Orders for stat head ct ordered. Pharmacy also contacted to adjust parkinsons meds to home dose. About 10 minutes after rn attempted to arouse pt., pt began answering yes or no to questions. Will continue to monitor

## 2016-12-03 NOTE — Progress Notes (Signed)
Subjective: Patient reports doing better.  No hallucinations or falling.  Objective: Vital signs in last 24 hours: Temp:  [97.7 F (36.5 C)-99.4 F (37.4 C)] 98 F (36.7 C) (03/31 1008) Pulse Rate:  [44-60] 52 (03/31 1008) Resp:  [13-30] 18 (03/31 1008) BP: (128-196)/(45-87) 186/74 (03/31 1008) SpO2:  [92 %-98 %] 96 % (03/31 1008) Weight:  [76.2 kg (168 lb)-77.5 kg (170 lb 14.4 oz)] 77.5 kg (170 lb 14.4 oz) (03/30 1600)  Intake/Output from previous day: 03/30 0701 - 03/31 0700 In: 240 [P.O.:240] Out: -  Intake/Output this shift: No intake/output data recorded.  Physical Exam: Patient has not been confused.  No hallucinations.  PT has assessed patient and feels she will be OK at home with visiting nursing.    Lab Results:  Recent Labs  12/02/16 1122 12/03/16 0416  WBC 7.4 5.5  HGB 11.9* 10.8*  HCT 36.5 33.1*  PLT 166 136*   BMET  Recent Labs  12/02/16 1122 12/03/16 0416  NA 140 139  K 3.2* 3.4*  CL 105 104  CO2 27 29  GLUCOSE 111* 95  BUN 19 19  CREATININE 0.80 0.64  CALCIUM 9.5 9.2    Studies/Results: Ct Head Wo Contrast  Result Date: 12/02/2016 CLINICAL DATA:  Postop deep brain stimulators.  Weakness in legs. EXAM: CT HEAD WITHOUT CONTRAST TECHNIQUE: Contiguous axial images were obtained from the base of the skull through the vertex without intravenous contrast. COMPARISON:  11/29/2016 FINDINGS: Brain: Bifrontal stimulators are noted in place with the tips in the region of the thalami bilaterally. No hemorrhage. No acute infarction. No hydrocephalus. Small amount of pneumocephalus anteriorly. Vascular: No hyperdense vessel or unexpected calcification. Skull: No acute calvarial abnormality. Sinuses/Orbits: Visualized paranasal sinuses and mastoids clear. Orbital soft tissues unremarkable. Other: None IMPRESSION: Bilateral deep brain stimulators in place entering in the frontal regions bilaterally with the tips in the region of the thalamus. Small amount of  pneumocephalus. No hemorrhage. Electronically Signed   By: Rolm Baptise M.D.   On: 12/02/2016 13:04   Dg Chest Port 1 View  Result Date: 12/02/2016 CLINICAL DATA:  Weakness starting last week, history of pneumonia EXAM: PORTABLE CHEST 1 VIEW COMPARISON:  10/14/2014 FINDINGS: Cardiomediastinal silhouette is stable. No infiltrate or pleural effusion. No pulmonary edema. Bony thorax is unremarkable. IMPRESSION: No active disease. Electronically Signed   By: Lahoma Crocker M.D.   On: 12/02/2016 14:33    Assessment/Plan: OK to discharge.  Nurse to check orthostatic BP.  Outpatient surgery scheduled for this coming Tuesday.    LOS: 0 days    Peggyann Shoals, MD 12/03/2016, 11:07 AM

## 2016-12-03 NOTE — Progress Notes (Addendum)
Patient currently in hospital but plan is that patient will be discharged prior to surgery on Tuesday. Printed instructions to give to patient along with CHG. Went over these instructions with patient and daughter at bedside. They verbalized understanding. Surgical PCR will be obtained today.

## 2016-12-03 NOTE — Progress Notes (Signed)
[facility] PROGRESS NOTE  Ana Salazar PQZ:[id number] DOB: [birth year] DOA: 12/02/2016   PCP: [name], MD     Assessment/Plan: Principal Problem:   Generalized weakness Active Problems:   Parkinson's disease ([place])   Acute hypokalemia   Uncontrolled hypertension   71 y.o. female with medical history significant for hypertension and Parkinson's disease. Patient currently is undergoing a staged process for bilateral deep brain stimulator implantation. Recent procedure was on 3/27 with a discharge date of 3/28. Since discharge patient has had some issues with transient confusion and generalized weakness (primarily involving the legs). She is not having any fevers, chills or other infectious symptoms. She has not had any nausea, vomiting or diarrhea. She has not fallen   Assessment/Plan     Generalized weakness/underlying Parkinson's disease  -Presents with reports of generalized weakness primarily with standing and involving the legs without witnessed seizure activity or any focal neurological findings on exam with negative CT of the head-symptoms have developed since surgical procedure for implantation of brain stimulator -Neurosurgery/Stern aware of patient's admission and will see patient in consultation later today -Borderline elevated urine specific gravity so concern over dehydration so hold home Lasix and check orthostatic vital signs -? Atypical UTI (symptoms could be masked by current use of Keflex prior to admission)-obtain urine culture-recently treated on 3/6 for Klebsiella UTI resistant to ampicillin. Repeat UA negative -Continue preadmission Sinemet and Comtan -Continue preadmission medications for symptomatic management: Celexa and Klonopin -PT/OT evaluation-very orthostatic today -?? Symptoms related to unexpected response to sedatives/anesthetic agents given during procedure TSH, okay CXR No active disease. EEG This normal EEG is recorded in the  waking and sleep state. There was no seizure or seizure predisposition recorded on this study. Please note that a normal EEG does not preclude the possibility of epilepsy.     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 We will hydrate the patient to see for orthostatics improved Support stockings, midodrine 5 mg 3 times a day    DVT prophylaxsis SCDs  Code Status:  Full code   Family Communication: Discussed in detail with the patient, all imaging results, lab results explained to the patient   Disposition Plan:  Anticipate discharge tomorrow if orthostatics have resolved      Consultants:  Neurosurgery  Procedures:  None  Antibiotics: Anti-infectives    None         HPI/Subjective: Extremely weak and dizzy when the patient is standing  Objective: Vitals:   12/02/16 2113 12/03/16 0102 12/03/16 0505 12/03/16 1008  BP: (!) 145/50 (!) 155/59 (!) 162/74 (!) 186/74  Pulse: (!) 48 (!) 47 (!) 46 (!) 52  Resp: 18 18 18 18   Temp: 97.7 F (36.5 C) 97.8 F (36.6 C) 97.7 F (36.5 C) 98 F (36.7 C)  TempSrc: Oral Oral Oral Oral  SpO2: 95% 94% 97% 96%  Weight:      Height:        Intake/Output Summary (Last 24 hours) at 12/03/16 1247 Last data filed at 12/03/16 0430  Gross per 24 hour  Intake              240 ml  Output                0 ml  Net  240 ml     General exam: Appears calm and comfortable  Respiratory system: Clear to auscultation. Respiratory effort normal. Cardiovascular system: S1 & S2 heard, RRR. No JVD, murmurs, rubs, gallops or clicks. No pedal edema. Gastrointestinal system: Abdomen is nondistended, soft and nontender. No organomegaly or masses felt. Normal bowel sounds heard. Central nervous system: Surgical scar on her for head  clean. Extremities: Symmetric 5 x 5 power. Skin: No rashes, lesions or ulcers Psychiatry: Judgement and insight appear normal. Mood & affect appropriate.     Data Reviewed: I have personally reviewed following labs and imaging studies  Micro Results Recent Results (from the past 240 hour(s))  Culture, Urine     Status: None   Collection Time: 12/02/16  2:00 PM  Result Value Ref Range Status   Specimen Description URINE, RANDOM  Final   Special Requests NONE  Final   Culture NO GROWTH  Final   Report Status 12/03/2016 FINAL  Final    Radiology Reports Ct Head Wo Contrast  Result Date: 12/02/2016 CLINICAL DATA:  Postop deep brain stimulators.  Weakness in legs. EXAM: CT HEAD WITHOUT CONTRAST TECHNIQUE: Contiguous axial images were obtained from the base of the skull through the vertex without intravenous contrast. COMPARISON:  11/29/2016 FINDINGS: Brain: Bifrontal stimulators are noted in place with the tips in the region of the thalami bilaterally. No hemorrhage. No acute infarction. No hydrocephalus. Small amount of pneumocephalus anteriorly. Vascular: No hyperdense vessel or unexpected calcification. Skull: No acute calvarial abnormality. Sinuses/Orbits: Visualized paranasal sinuses and mastoids clear. Orbital soft tissues unremarkable. Other: None IMPRESSION: Bilateral deep brain stimulators in place entering in the frontal regions bilaterally with the tips in the region of the thalamus. Small amount of pneumocephalus. No hemorrhage. Electronically Signed   By: Rolm Baptise M.D.   On: 12/02/2016 13:04   Ct Head Wo Contrast  Result Date: 11/29/2016 CLINICAL DATA:  Status post deep brain stimulator placement. EXAM: CT HEAD WITHOUT CONTRAST TECHNIQUE: Contiguous axial images were obtained from the base of the skull through the vertex without intravenous contrast. COMPARISON:  11/22/2016 FINDINGS: Brain: New bilateral deep brain stimulator via burr holes. The leads appear symmetrically  placed. Expected pneumocephalus. No intracranial hemorrhage, swelling, or infarct. No intraventricular extension. Vascular: Atherosclerotic calcification. Skull: Burr holes and expected scalp gas and soft tissue swelling. Sinuses/Orbits: Chronic opacification of bilateral mastoid and middle ear spaces. Polyp or retention cyst in the right maxillary antrum. IMPRESSION: No unexpected finding after deep brain stimulator placement. Electronically Signed   By: Monte Fantasia M.D.   On: 11/29/2016 15:41   Ct Head Wo Contrast  Result Date: 11/29/2016 CLINICAL DATA:  71 year old female. Parkinson disease. Preoperative study for deep brain stimulator placement, skull fiducials in place. EXAM: CT HEAD WITHOUT CONTRAST TECHNIQUE: Contiguous axial images were obtained from the base of the skull through the vertex without intravenous contrast. COMPARISON:  Brain MRI 09/13/2016 and earlier. FINDINGS: Brain: Mild motion artifact, but not significantly affecting imaging of the brain parenchyma. Stable cerebral volume. Patchy and confluent cerebral white matter hypodensity corresponding to T2/FLAIR changes on the previous MRIs. No midline shift, ventriculomegaly, mass effect, evidence of mass lesion, intracranial hemorrhage or evidence of cortically based acute infarction. No cortical encephalomalacia. Vascular: Calcified atherosclerosis at the skull base. Skull: Multiple skull fiducials in place. The anterior fiducials are slightly affected by motion artifact. No other acute osseous abnormality. Sinuses/Orbits: Chronic right maxillary mucous retention cyst. Chronic right mastoid effusions. Other: Negative orbit soft tissues. Mild postoperative scalp soft  tissue changes from fiducial placement. IMPRESSION: 1. Study for stereotactic surgical planning with multiple skull fiducials in place. 2. No acute intracranial abnormality. Stable brain with chronic nonspecific white matter changes. Electronically Signed   By: Genevie Ann M.D.    On: 11/22/2016 11:23   Dg Chest Port 1 View  Result Date: 12/02/2016 CLINICAL DATA:  Weakness starting last week, history of pneumonia EXAM: PORTABLE CHEST 1 VIEW COMPARISON:  10/14/2014 FINDINGS: Cardiomediastinal silhouette is stable. No infiltrate or pleural effusion. No pulmonary edema. Bony thorax is unremarkable. IMPRESSION: No active disease. Electronically Signed   By: Lahoma Crocker M.D.   On: 12/02/2016 14:33     CBC  Recent Labs Lab 11/29/16 0618 12/02/16 1122 12/03/16 0416  WBC 7.3 7.4 5.5  HGB 13.3 11.9* 10.8*  HCT 40.7 36.5 33.1*  PLT 161 166 136*  MCV 93.8 93.1 92.7  MCH 30.6 30.4 30.3  MCHC 32.7 32.6 32.6  RDW 14.5 14.2 14.1  LYMPHSABS  --  1.1  --   MONOABS  --  0.5  --   EOSABS  --  0.1  --   BASOSABS  --  0.0  --     Chemistries   Recent Labs Lab 11/29/16 0618 12/02/16 1122 12/02/16 1357 12/03/16 0416  NA 143 140  --  139  K 3.4* 3.2*  --  3.4*  CL 104 105  --  104  CO2 29 27  --  29  GLUCOSE 108* 111*  --  95  BUN 22* 19  --  19  CREATININE 0.81 0.80  --  0.64  CALCIUM 9.7 9.5  --  9.2  MG  --   --  1.9  --   AST 13* 14*  --  9*  ALT 9* <5*  --  <5*  ALKPHOS 85 74  --  71  BILITOT 0.9 0.7  --  0.7   ------------------------------------------------------------------------------------------------------------------ estimated creatinine clearance is 69.3 mL/min (by C-G formula based on SCr of 0.64 mg/dL). ------------------------------------------------------------------------------------------------------------------ No results for input(s): HGBA1C in the last 72 hours. ------------------------------------------------------------------------------------------------------------------ No results for input(s): CHOL, HDL, LDLCALC, TRIG, CHOLHDL, LDLDIRECT in the last 72 hours. ------------------------------------------------------------------------------------------------------------------  Recent Labs  12/02/16 1439  TSH 3.241    ------------------------------------------------------------------------------------------------------------------ No results for input(s): VITAMINB12, FOLATE, FERRITIN, TIBC, IRON, RETICCTPCT in the last 72 hours.  Coagulation profile No results for input(s): INR, PROTIME in the last 168 hours.  No results for input(s): DDIMER in the last 72 hours.  Cardiac Enzymes No results for input(s): CKMB, TROPONINI, MYOGLOBIN in the last 168 hours.  Invalid input(s): CK ------------------------------------------------------------------------------------------------------------------ Invalid input(s): POCBNP   CBG: No results for input(s): GLUCAP in the last 168 hours.     Studies: Ct Head Wo Contrast  Result Date: 12/02/2016 CLINICAL DATA:  Postop deep brain stimulators.  Weakness in legs. EXAM: CT HEAD WITHOUT CONTRAST TECHNIQUE: Contiguous axial images were obtained from the base of the skull through the vertex without intravenous contrast. COMPARISON:  11/29/2016 FINDINGS: Brain: Bifrontal stimulators are noted in place with the tips in the region of the thalami bilaterally. No hemorrhage. No acute infarction. No hydrocephalus. Small amount of pneumocephalus anteriorly. Vascular: No hyperdense vessel or unexpected calcification. Skull: No acute calvarial abnormality. Sinuses/Orbits: Visualized paranasal sinuses and mastoids clear. Orbital soft tissues unremarkable. Other: None IMPRESSION: Bilateral deep brain stimulators in place entering in the frontal regions bilaterally with the tips in the region of the thalamus. Small amount of pneumocephalus. No hemorrhage. Electronically Signed   By: Lennette Bihari  [place] M.D.   On: 12/02/2016 13:04   Dg Chest Port 1 View  Result Date: 12/02/2016 CLINICAL DATA:  Weakness starting last week, history of pneumonia EXAM: PORTABLE CHEST 1 VIEW COMPARISON:  10/14/2014 FINDINGS: Cardiomediastinal silhouette is stable. No infiltrate or pleural effusion. No pulmonary  edema. Bony thorax is unremarkable. IMPRESSION: No active disease. Electronically Signed   By: Lahoma Crocker M.D.   On: 12/02/2016 14:33      No results found for: HGBA1C Lab Results  Component Value Date   CREATININE 0.64 12/03/2016       Scheduled Meds: . carbidopa-levodopa  1 tablet Oral QHS  . carbidopa-levodopa  2 tablet Oral TID  . citalopram  20 mg Oral Daily  . clonazePAM  0.25 mg Oral QHS  . entacapone  200 mg Oral TID  . verapamil  80 mg Oral TID   Continuous Infusions: . sodium chloride 75 mL/hr at 12/03/16 1247     LOS: 0 days    Time spent: >30 MINS    [facility]  [facility] Pager [phone]. If 7PM-7AM, please contact night-coverage at [email or web address], password [email or web address] 12/03/2016, 12:47 PM  LOS: 0 days

## 2016-12-04 DIAGNOSIS — R4020 Unspecified coma: Secondary | ICD-10-CM

## 2016-12-04 DIAGNOSIS — R4182 Altered mental status, unspecified: Secondary | ICD-10-CM

## 2016-12-04 LAB — BASIC METABOLIC PANEL
Anion gap: 5 (ref 5–15)
BUN: 17 mg/dL (ref 6–20)
CHLORIDE: 106 mmol/L (ref 101–111)
CO2: 30 mmol/L (ref 22–32)
Calcium: 9.3 mg/dL (ref 8.9–10.3)
Creatinine, Ser: 0.83 mg/dL (ref 0.44–1.00)
GFR calc non Af Amer: >60 mL/min (ref >60)
Glucose, Bld: 116 mg/dL — ABNORMAL HIGH (ref 65–99)
Potassium: 3.9 mmol/L (ref 3.5–5.1)
SODIUM: 141 mmol/L (ref 135–145)

## 2016-12-04 LAB — MAGNESIUM: MAGNESIUM: 1.7 mg/dL (ref 1.7–2.4)

## 2016-12-04 MED ORDER — MIDODRINE HCL 5 MG PO TABS: 5.0000 mg | ORAL_TABLET | Freq: Three times a day (TID) | ORAL | 1 refills | Status: DC

## 2016-12-04 NOTE — Care Management Note (Signed)
Case Management Note  Patient Details  Name: Ana Salazar MRN: [id number] Date of Birth: 1946-06-13  Subjective/Objective:  Falls, weakness, incontinence              Action/Plan: Discharge Planning: NCM spoke to pt and offered choice for [facility]. States she had [facility] in the past. Requesting RW for home. Contacted [facility] DME rep for RW.   Pt declining RW.   PCP [name] MD  Expected Discharge Date:  12/04/16               Expected Discharge Plan:  [place]  In-House Referral:  NA  Discharge planning Services  CM Consult  Post Acute Care Choice:  Home Health Choice offered to:  Patient  DME Arranged:  [facility] DME Agency:  [place] Arranged:  PT, OT [facility] Agency:  [place]  Status of Service:  Completed, signed off  If discussed at [place] of Stay Meetings, dates discussed:    Additional Comments:  Erenest Rasher, RN 12/04/2016, 6:25 PM

## 2016-12-04 NOTE — Progress Notes (Signed)
[facility] PROGRESS NOTE  Daci Stubbe VOJ:[id number] DOB: [birth year] DOA: 12/02/2016   PCP: [name], MD     Assessment/Plan: Principal Problem:   Generalized weakness Active Problems:   Parkinson's disease ([place])   Acute hypokalemia   Uncontrolled hypertension   Altered mental status   71 y.o. female with medical history significant for hypertension and Parkinson's disease. Patient currently is undergoing a staged process for bilateral deep brain stimulator implantation. Recent procedure was on 3/27 with a discharge date of 3/28. Since discharge patient has had some issues with transient confusion and generalized weakness (primarily involving the legs). Now found to have altered mental status and orthostatic hypotension    Assessment/Plan     Generalized weakness/underlying Parkinson's disease  -Presents with reports of generalized weakness primarily with standing and involving the legs without witnessed seizure activity or any focal neurological findings on exam with negative CT of the head-symptoms have developed since surgical procedure for implantation of brain stimulator -Neurosurgery/[facility]  following -Found to be positively orthostatic , Lasix on hold -UA negative this admission, recently treated on 3/6 for Klebsiella UTI resistant to ampicillin. Repeat UA negative -Continue preadmission Sinemet and Comtan-dosage/timing adjusted by pharmacy -Continue preadmission medications for symptomatic management: Celexa and Klonopin -PT/OT evaluation-home health versus SNF TSH, okay CXR No active disease. EEG This normal EEG is recorded in the waking and sleep state. There was no seizure or seizure predisposition recorded on this study.  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 Continue IV fluids, repeat orthostatics Support stockings, midodrine 5 mg 3 times a day    DVT prophylaxsis SCDs  Code Status:  Full code   Family Communication: Discussed in detail with the patient, all imaging results, lab results explained to the patient   Disposition Plan:  Anticipate discharge 1-2 days      Consultants:  Neurosurgery  Procedures:  None  Antibiotics: Anti-infectives    None         HPI/Subjective: Extremely weak and dizzy when the patient is standing  Objective: Vitals:   12/03/16 1711 12/03/16 2051 12/04/16 0100 12/04/16 0500  BP: (!) 180/85 (!) 174/64 (!) 168/65 (!) 172/82  Pulse: (!) 53 (!) 50 (!) 47 (!) 53  Resp: 14 16 18 18   Temp:  98 F (36.7 C) 97.9 F (36.6 C) 97.9 F (36.6 C)  TempSrc:  Oral Oral Oral  SpO2: 95% 97% 97% 96%  Weight:      Height:        Intake/Output Summary (Last 24 hours) at 12/04/16 0952 Last data filed at 12/04/16 0400  Gross per 24 hour  Intake          1141.25 ml  Output                0 ml  Net          1141.25 ml     General exam: Appears calm and comfortable  Respiratory system: Clear to auscultation. Respiratory effort normal. Cardiovascular system: S1 & S2 heard, RRR. No JVD, murmurs, rubs, gallops or clicks. No pedal edema. Gastrointestinal system: Abdomen is nondistended, soft and nontender. No organomegaly or masses felt. Normal bowel sounds heard. Central nervous system: Surgical scar on her for head clean. Extremities: Symmetric 5 x 5  power. Skin: No rashes, lesions or ulcers Psychiatry: Judgement and insight appear normal. Mood & affect appropriate.     Data Reviewed: I have personally reviewed following labs and imaging studies  Micro Results Recent Results (from the past 240 hour(s))  Culture, Urine     Status: None   Collection Time: 12/02/16  2:00 PM  Result Value Ref Range Status   Specimen  Description URINE, RANDOM  Final   Special Requests NONE  Final   Culture NO GROWTH  Final   Report Status 12/03/2016 FINAL  Final  Surgical pcr screen     Status: None   Collection Time: 12/03/16 12:34 PM  Result Value Ref Range Status   MRSA, PCR NEGATIVE NEGATIVE Final   Staphylococcus aureus NEGATIVE NEGATIVE Final    Comment:        The Xpert SA Assay (FDA approved for NASAL specimens in patients over 73 years of age), is one component of a comprehensive surveillance program.  Test performance has been validated by [facility] for patients greater than or equal to 74 year old. It is not intended to diagnose infection nor to guide or monitor treatment.     Radiology Reports Ct Head Wo Contrast  Result Date: 12/03/2016 CLINICAL DATA:  Acute onset of altered mental status. Initial encounter. EXAM: CT HEAD WITHOUT CONTRAST TECHNIQUE: Contiguous axial images were obtained from the base of the skull through the vertex without intravenous contrast. COMPARISON:  CT of the head performed 12/02/2016, and MRI of the brain performed 09/13/2016 FINDINGS: Brain: No evidence of acute infarction, hemorrhage, hydrocephalus, extra-axial collection or mass lesion/mass effect. Scattered periventricular and subcortical white matter change likely reflects small vessel ischemic microangiopathy. Bilateral subthalamic stimulator leads are noted. The brainstem and fourth ventricle are within normal limits. The basal ganglia are unremarkable in appearance. The cerebral hemispheres demonstrate grossly normal gray-white differentiation. No mass effect or midline shift is seen. Vascular: No hyperdense vessel or unexpected calcification. Skull: There is no evidence of fracture; postoperative change is noted at the high frontal calvarium bilaterally, with minimal pneumocephalus and associated leads. Sinuses/Orbits: The visualized portions of the orbits are within normal limits. A mucus retention cyst or polyp is  noted at the right maxillary sinus. There is opacification of the mastoid air cells bilaterally. The remaining paranasal sinuses are well-aerated. Other: No additional soft tissue abnormalities are seen. IMPRESSION: 1. No acute intracranial pathology seen on CT. 2. Bilateral subthalamic stimulator leads noted, with associated postoperative change. 3. Scattered small vessel ischemic microangiopathy. 4. Mucus retention cyst or polyp at the right maxillary sinus. Opacification of the mastoid air cells bilaterally. Electronically Signed   By: Garald Balding M.D.   On: 12/03/2016 20:45   Ct Head Wo Contrast  Result Date: 12/02/2016 CLINICAL DATA:  Postop deep brain stimulators.  Weakness in legs. EXAM: CT HEAD WITHOUT CONTRAST TECHNIQUE: Contiguous axial images were obtained from the base of the skull through the vertex without intravenous contrast. COMPARISON:  11/29/2016 FINDINGS: Brain: Bifrontal stimulators are noted in place with the tips in the region of the thalami bilaterally. No hemorrhage. No acute infarction. No hydrocephalus. Small amount of pneumocephalus anteriorly. Vascular: No hyperdense vessel or unexpected calcification. Skull: No acute calvarial abnormality. Sinuses/Orbits: Visualized paranasal sinuses and mastoids clear. Orbital soft tissues unremarkable. Other: None IMPRESSION: Bilateral deep brain stimulators in place entering in the frontal regions bilaterally with the tips in the region of the thalamus. Small amount of pneumocephalus. No hemorrhage. Electronically Signed   By: Lennette Bihari  [place] M.D.   On: 12/02/2016 13:04   Ct Head Wo Contrast  Result Date: 11/29/2016 CLINICAL DATA:  Status post deep brain stimulator placement. EXAM: CT HEAD WITHOUT CONTRAST TECHNIQUE: Contiguous axial images were obtained from the base of the skull through the vertex without intravenous contrast. COMPARISON:  11/22/2016 FINDINGS: Brain: New bilateral deep brain stimulator via burr holes. The leads appear  symmetrically placed. Expected pneumocephalus. No intracranial hemorrhage, swelling, or infarct. No intraventricular extension. Vascular: Atherosclerotic calcification. Skull: Burr holes and expected scalp gas and soft tissue swelling. Sinuses/Orbits: Chronic opacification of bilateral mastoid and middle ear spaces. Polyp or retention cyst in the right maxillary antrum. IMPRESSION: No unexpected finding after deep brain stimulator placement. Electronically Signed   By: Monte Fantasia M.D.   On: 11/29/2016 15:41   Ct Head Wo Contrast  Result Date: 11/29/2016 CLINICAL DATA:  71 year old female. Parkinson disease. Preoperative study for deep brain stimulator placement, skull fiducials in place. EXAM: CT HEAD WITHOUT CONTRAST TECHNIQUE: Contiguous axial images were obtained from the base of the skull through the vertex without intravenous contrast. COMPARISON:  Brain MRI 09/13/2016 and earlier. FINDINGS: Brain: Mild motion artifact, but not significantly affecting imaging of the brain parenchyma. Stable cerebral volume. Patchy and confluent cerebral white matter hypodensity corresponding to T2/FLAIR changes on the previous MRIs. No midline shift, ventriculomegaly, mass effect, evidence of mass lesion, intracranial hemorrhage or evidence of cortically based acute infarction. No cortical encephalomalacia. Vascular: Calcified atherosclerosis at the skull base. Skull: Multiple skull fiducials in place. The anterior fiducials are slightly affected by motion artifact. No other acute osseous abnormality. Sinuses/Orbits: Chronic right maxillary mucous retention cyst. Chronic right mastoid effusions. Other: Negative orbit soft tissues. Mild postoperative scalp soft tissue changes from fiducial placement. IMPRESSION: 1. Study for stereotactic surgical planning with multiple skull fiducials in place. 2. No acute intracranial abnormality. Stable brain with chronic nonspecific white matter changes. Electronically Signed   By: Genevie Ann M.D.   On: 11/22/2016 11:23   Dg Chest Port 1 View  Result Date: 12/02/2016 CLINICAL DATA:  Weakness starting last week, history of pneumonia EXAM: PORTABLE CHEST 1 VIEW COMPARISON:  10/14/2014 FINDINGS: Cardiomediastinal silhouette is stable. No infiltrate or pleural effusion. No pulmonary edema. Bony thorax is unremarkable. IMPRESSION: No active disease. Electronically Signed   By: Lahoma Crocker M.D.   On: 12/02/2016 14:33     CBC  Recent Labs Lab 11/29/16 0618 12/02/16 1122 12/03/16 0416  WBC 7.3 7.4 5.5  HGB 13.3 11.9* 10.8*  HCT 40.7 36.5 33.1*  PLT 161 166 136*  MCV 93.8 93.1 92.7  MCH 30.6 30.4 30.3  MCHC 32.7 32.6 32.6  RDW 14.5 14.2 14.1  LYMPHSABS  --  1.1  --   MONOABS  --  0.5  --   EOSABS  --  0.1  --   BASOSABS  --  0.0  --     Chemistries   Recent Labs Lab 11/29/16 0618 12/02/16 1122 12/02/16 1357 12/03/16 0416  NA 143 140  --  139  K 3.4* 3.2*  --  3.4*  CL 104 105  --  104  CO2 29 27  --  29  GLUCOSE 108* 111*  --  95  BUN 22* 19  --  19  CREATININE 0.81 0.80  --  0.64  CALCIUM 9.7 9.5  --  9.2  MG  --   --  1.9  --   AST 13* 14*  --  9*  ALT 9* <5*  --  <  5*  ALKPHOS 85 74  --  71  BILITOT 0.9 0.7  --  0.7   ------------------------------------------------------------------------------------------------------------------ estimated creatinine clearance is 69.3 mL/min (by C-G formula based on SCr of 0.64 mg/dL). ------------------------------------------------------------------------------------------------------------------ No results for input(s): HGBA1C in the last 72 hours. ------------------------------------------------------------------------------------------------------------------ No results for input(s): CHOL, HDL, LDLCALC, TRIG, CHOLHDL, LDLDIRECT in the last 72 hours. ------------------------------------------------------------------------------------------------------------------  Recent Labs  12/02/16 1439  TSH 3.241    ------------------------------------------------------------------------------------------------------------------ No results for input(s): VITAMINB12, FOLATE, FERRITIN, TIBC, IRON, RETICCTPCT in the last 72 hours.  Coagulation profile No results for input(s): INR, PROTIME in the last 168 hours.  No results for input(s): DDIMER in the last 72 hours.  Cardiac Enzymes No results for input(s): CKMB, TROPONINI, MYOGLOBIN in the last 168 hours.  Invalid input(s): CK ------------------------------------------------------------------------------------------------------------------ Invalid input(s): POCBNP   CBG: No results for input(s): GLUCAP in the last 168 hours.     Studies: Ct Head Wo Contrast  Result Date: 12/03/2016 CLINICAL DATA:  Acute onset of altered mental status. Initial encounter. EXAM: CT HEAD WITHOUT CONTRAST TECHNIQUE: Contiguous axial images were obtained from the base of the skull through the vertex without intravenous contrast. COMPARISON:  CT of the head performed 12/02/2016, and MRI of the brain performed 09/13/2016 FINDINGS: Brain: No evidence of acute infarction, hemorrhage, hydrocephalus, extra-axial collection or mass lesion/mass effect. Scattered periventricular and subcortical white matter change likely reflects small vessel ischemic microangiopathy. Bilateral subthalamic stimulator leads are noted. The brainstem and fourth ventricle are within normal limits. The basal ganglia are unremarkable in appearance. The cerebral hemispheres demonstrate grossly normal gray-white differentiation. No mass effect or midline shift is seen. Vascular: No hyperdense vessel or unexpected calcification. Skull: There is no evidence of fracture; postoperative change is noted at the high frontal calvarium bilaterally, with minimal pneumocephalus and associated leads. Sinuses/Orbits: The visualized portions of the orbits are within normal limits. A mucus retention cyst or polyp is noted  at the right maxillary sinus. There is opacification of the mastoid air cells bilaterally. The remaining paranasal sinuses are well-aerated. Other: No additional soft tissue abnormalities are seen. IMPRESSION: 1. No acute intracranial pathology seen on CT. 2. Bilateral subthalamic stimulator leads noted, with associated postoperative change. 3. Scattered small vessel ischemic microangiopathy. 4. Mucus retention cyst or polyp at the right maxillary sinus. Opacification of the mastoid air cells bilaterally. Electronically Signed   By: Garald Balding M.D.   On: 12/03/2016 20:45   Ct Head Wo Contrast  Result Date: 12/02/2016 CLINICAL DATA:  Postop deep brain stimulators.  Weakness in legs. EXAM: CT HEAD WITHOUT CONTRAST TECHNIQUE: Contiguous axial images were obtained from the base of the skull through the vertex without intravenous contrast. COMPARISON:  11/29/2016 FINDINGS: Brain: Bifrontal stimulators are noted in place with the tips in the region of the thalami bilaterally. No hemorrhage. No acute infarction. No hydrocephalus. Small amount of pneumocephalus anteriorly. Vascular: No hyperdense vessel or unexpected calcification. Skull: No acute calvarial abnormality. Sinuses/Orbits: Visualized paranasal sinuses and mastoids clear. Orbital soft tissues unremarkable. Other: None IMPRESSION: Bilateral deep brain stimulators in place entering in the frontal regions bilaterally with the tips in the region of the thalamus. Small amount of pneumocephalus. No hemorrhage. Electronically Signed   By: Rolm Baptise M.D.   On: 12/02/2016 13:04   Dg Chest Port 1 View  Result Date: 12/02/2016 CLINICAL DATA:  Weakness starting last week, history of pneumonia EXAM: PORTABLE CHEST 1 VIEW COMPARISON:  10/14/2014 FINDINGS: Cardiomediastinal silhouette is stable. No infiltrate or pleural effusion. No pulmonary  edema. Bony thorax is unremarkable. IMPRESSION: No active disease. Electronically Signed   By: Lahoma Crocker M.D.   On:  12/02/2016 14:33      No results found for: HGBA1C Lab Results  Component Value Date   CREATININE 0.64 12/03/2016       Scheduled Meds: . carbidopa-levodopa  1 tablet Oral QHS  . carbidopa-levodopa  1 tablet Oral TID   And  . carbidopa-levodopa  1 tablet Oral BID  . citalopram  20 mg Oral Daily  . clonazePAM  0.25 mg Oral QHS  . entacapone  200 mg Oral TID  . midodrine  5 mg Oral TID WC  . verapamil  80 mg Oral TID   Continuous Infusions: . sodium chloride 75 mL/hr at 12/03/16 1247     LOS: 1 day    Time spent: >30 MINS    [facility] Pager [phone]. If 7PM-7AM, please contact night-coverage at [email or web address], password [facility] 12/04/2016, 9:52 AM  LOS: 1 day

## 2016-12-04 NOTE — Progress Notes (Addendum)
Physical Therapy Progress Note  From PT evaluation 12/03/16, HHPT was recommended, but not indicated under Follow-up Recommendations.  Patient will need f/u HHPT.  Patient has a RW at home.  Have attempted to see patient this am x3 - patient unavailable.  Will return after lunch for PT session.  [name] Sanjuana Kava, [place] Pager [phone]

## 2016-12-04 NOTE — Progress Notes (Signed)
Pt d/c to home by car with family. Assessment stable. All questions answered. 

## 2016-12-04 NOTE — Progress Notes (Signed)
Pt seen and examined.  Was lethargic yesterday. Stat CT head normal. Back to baseline shortly after. Believe due to Parkinson's meds. Having issues with orthostatic hypotn - managed by internal  Feels well today.  No neuro sx.  EXAM: Temp:  [97.9 F (36.6 C)-98 F (36.7 C)] 97.9 F (36.6 C) (04/01 0500) Pulse Rate:  [47-53] 53 (04/01 0500) Resp:  [14-20] 18 (04/01 0500) BP: (168-190)/(64-85) 172/82 (04/01 0500) SpO2:  [95 %-97 %] 96 % (04/01 0500) Intake/Output      03/31 0701 - 04/01 0700 04/01 0701 - 04/02 0700   P.O.     I.V. (mL/kg) 1141.3 (14.7)    Total Intake(mL/kg) 1141.3 (14.7)    Net +1141.3          Urine Occurrence 2 x    Stool Occurrence 2 x     Awake and alert PERRL. CN grossly intact Follows commands throughout Strength normal Wound c/di  Stable Continue current care Okay to discharge from NS standpoint once no longer orthostatic

## 2016-12-04 NOTE — Progress Notes (Signed)
qPhysical Therapy Treatment Patient Details Name: Ana Salazar MRN: [id number] DOB: June 12, 1946 Today's Date: 12/04/2016    History of Present Illness Patient is a 71 y/o female with bilateral DBS placed 3/28 presents from home with falls, weakness, incontinence and hallucinations. Head CT-unremarkable. EEG normal. Plan for surgery on 4/3 for implantable pulse generator. PMH includes PD, CVA, HTN, depression, CHF, asthma.    PT Comments    Patient has made good progress with mobility and gait.  Continued to have unsteady gait requiring assist.  Agree with need for HHPT at d/c for continued therapy.   Follow Up Recommendations  Home health PT;Supervision for mobility/OOB     Equipment Recommendations  None recommended by PT    Recommendations for Other Services       Precautions / Restrictions Precautions Precautions: Fall Precaution Comments: orthostasis and h/o falls  Restrictions Weight Bearing Restrictions: No    Mobility  Bed Mobility               General bed mobility comments: Up in chair  Transfers Overall transfer level: Modified independent Equipment used: None                Ambulation/Gait Ambulation/Gait assistance: Min guard Ambulation Distance (Feet): 84 Feet Assistive device: None Gait Pattern/deviations: Step-through pattern;Decreased stride length;Trunk flexed Gait velocity: decreased Gait velocity interpretation: Below normal speed for age/gender General Gait Details: Patient declined use of RW.  Patient able to ambulate with no assistive device 24' with min guard assist.  No loss of balance on flat surface, but slightly unsteady when attempts to perform a single task during gait.   Stairs Stairs: Yes   Stair Management: One rail Right;Step to pattern;Forwards Number of Stairs: 3 General stair comments: Min guard for assist.  Instructed step-to pattern.  Wheelchair Mobility    Modified Rankin (Stroke Patients Only)        Balance           Standing balance support: No upper extremity supported;During functional activity Standing balance-Leahy Scale: Good Standing balance comment: Unsteadiness with high level balance activities                            Cognition Arousal/Alertness: Awake/alert Behavior During Therapy: WFL for tasks assessed/performed;Flat affect Overall Cognitive Status: Impaired/Different from baseline Area of Impairment: Attention;Memory;Awareness;Problem solving                   Current Attention Level: Selective Memory: Decreased short-term memory Following Commands: Follows one step commands consistently Safety/Judgement: Decreased awareness of deficits   Problem Solving: Slow processing;Difficulty sequencing;Requires verbal cues        Exercises      General Comments        Pertinent Vitals/Pain Pain Assessment: No/denies pain    Home Living                      Prior Function            PT Goals (current goals can now be found in the care plan section) Acute Rehab PT Goals Patient Stated Goal: to return to the gym and go home today Progress towards PT goals: Progressing toward goals    Frequency    Min 3X/week      PT Plan Current plan remains appropriate    Co-evaluation             End of Session Equipment Utilized During Treatment:  Gait belt Activity Tolerance: Patient tolerated treatment well Patient left: in chair;with nursing/sitter in room (Patient in w/c for d/c) Nurse Communication: Mobility status PT Visit Diagnosis: Unsteadiness on feet (R26.81);Other abnormalities of gait and mobility (R26.89)     Time: 4944-9675 PT Time Calculation (min) (ACUTE ONLY): 9 min  Charges:  $Gait Training: 8-22 mins                    G Codes:       [name]. Sanjuana Kava, [facility] Acute Rehab Services Pager [place] 12/04/2016, 5:38 PM

## 2016-12-04 NOTE — Discharge Instructions (Signed)
Midodrine tablets What is this medicine? MIDODRINE (MI doe dreen) is used to treat low blood pressure in patients who have symptoms like dizziness when going from a sitting to a standing position. This medicine may be used for other purposes; ask your health care provider or pharmacist if you have questions. COMMON BRAND NAME(S): Orvaten, ProAmatine What should I tell my health care provider before I take this medicine? They need to know if you have any of the following conditions: -difficulty passing urine -heart disease -high blood pressure -kidney disease -over active thyroid -pheochromocytoma -an unusual or allergic reaction to midodrine, other medicines, foods, dyes, or preservatives -pregnant or trying to get pregnant -breast-feeding How should I use this medicine? Take this medicine by mouth with a glass of water. Follow the directions on the prescription label. The last dose of this medicine should not be taken after the evening meal or less than 4 hours before bedtime. When you lie down for any length of time after taking this medicine, high blood pressure can occur. Do not take this medicine if you will be lying down for any length of time. Do not take your medicine more often than directed. Do not stop taking except on your doctor's advice. Talk to your pediatrician regarding the use of this medicine in children. Special care may be needed. Overdosage: If you think you have taken too much of this medicine contact a poison control center or emergency room at once. NOTE: This medicine is only for you. Do not share this medicine with others. What if I miss a dose? If you miss a dose, take it as soon as you can. If it is almost time for your next dose, take only that dose. Do not take double or extra doses. What may interact with this medicine? Do not take this medicine with any of the following medications: -MAOIs like Carbex, Eldepryl, Marplan, Nardil, and Parnate -medicines called  ergot alkaloids -medicines for colds and breathing difficulties or weight loss -procarbazine This medicine may also interact with the following medications: -cimetidine -digoxin -flecainide -fludrocortisone -metformin -procainamide -quinidine -ranitidine -triamterene -medicines called alpha-blockers like doxazosin, prazosin, and terazosin This list may not describe all possible interactions. Give your health care provider a list of all the medicines, herbs, non-prescription drugs, or dietary supplements you use. Also tell them if you smoke, drink alcohol, or use illegal drugs. Some items may interact with your medicine. What should I watch for while using this medicine? Visit your doctor or health care professional for regular checks on your progress. You may get drowsy or dizzy. Do not drive, use machinery, or do anything that needs mental alertness until you know how this medicine affects you. Do not stand or sit up quickly, especially if you are an older patient. This reduces the risk of dizzy or fainting spells. Your mouth may get dry. Chewing sugarless gum or sucking hard candy, and drinking plenty of water may help. Contact your doctor if the problem does not go away or is severe. Do not treat yourself for coughs, colds, or pain while you are taking this medicine without asking your doctor or health care professional for advice. Some ingredients may increase your blood pressure. What side effects may I notice from receiving this medicine? Side effects that you should report to your doctor or health care professional as soon as possible: -awareness of heart beating -blurred vision -headache -irregular heartbeat, palpitations, or chest pain -pounding in the ears -skin rash, hives Side effects that  usually do not require medical attention (report to your doctor or health care professional if they continue or are bothersome): -change in heart rate -chills -goose bumps -increased  need to urinate -itching -stomach pain -tingling in the skin or scalp This list may not describe all possible side effects. Call your doctor for medical advice about side effects. You may report side effects to FDA at 1-800-FDA-1088. Where should I keep my medicine? Keep out of the reach of children. Store at room temperature between 15 and 30 degrees C (59 and 86 degrees F). Throw away any unused medicine after the expiration date. NOTE: This sheet is a summary. It may not cover all possible information. If you have questions about this medicine, talk to your doctor, pharmacist, or health care provider.  2018 Elsevier/Gold Standard (2008-03-10 13:51:24) Orthostatic Hypotension Orthostatic hypotension is a sudden drop in blood pressure that happens when you quickly change positions, such as when you get up from a seated or lying position. Blood pressure is a measurement of how strongly, or weakly, your blood is pressing against the walls of your arteries. Arteries are blood vessels that carry blood from your heart throughout your body. When blood pressure is too low, you may not get enough blood to your brain or to the rest of your organs. This can cause weakness, light-headedness, rapid heartbeat, and fainting. This can last for just a few seconds or for up to a few minutes. Orthostatic hypotension is usually not a serious problem. However, if it happens frequently or gets worse, it may be a sign of something more serious. What are the causes? This condition may be caused by:  Sudden changes in posture, such as standing up quickly after you have been sitting or lying down.  Blood loss.  Loss of body fluids (dehydration).  Heart problems.  Hormone (endocrine) problems.  Pregnancy.  Severe infection.  Lack of certain nutrients.  Severe allergic reactions (anaphylaxis).  Certain medicines, such as blood pressure medicine or medicines that make the body lose excess fluids (diuretics).  Sometimes, this condition can be caused by not taking medicine as directed, such as taking too much of a certain medicine. What increases the risk? Certain factors can make you more likely to develop orthostatic hypotension, including:  Age. Risk increases as you get older.  Conditions that affect the heart or the central nervous system.  Taking certain medicines, such as blood pressure medicine or diuretics.  Being pregnant. What are the signs or symptoms? Symptoms of this condition may include:  Weakness.  Light-headedness.  Dizziness.  Blurred vision.  Fatigue.  Rapid heartbeat.  Fainting, in severe cases. How is this diagnosed? This condition is diagnosed based on:  Your medical history.  Your symptoms.  Your blood pressure measurement. Your health care provider will check your blood pressure when you are:  Lying down.  Sitting.  Standing. A blood pressure reading is recorded as two numbers, such as "120 over 80" (or 120/80). The first ("top") number is called the systolic pressure. It is a measure of the pressure in your arteries as your heart beats. The second ("bottom") number is called the diastolic pressure. It is a measure of the pressure in your arteries when your heart relaxes between beats. Blood pressure is measured in a unit called mm Hg. Healthy blood pressure for adults is 120/80. If your blood pressure is below 90/60, you may be diagnosed with hypotension. Other information or tests that may be used to diagnose orthostatic hypotension  include:  Your other vital signs, such as your heart rate and temperature.  Blood tests.  Tilt table test. For this test, you will be safely secured to a table that moves you from a lying position to an upright position. Your heart rhythm and blood pressure will be monitored during the test. How is this treated? Treatment for this condition may include:  Changing your diet. This may involve eating more salt (sodium)  or drinking more water.  Taking medicines to raise your blood pressure.  Changing the dosage of certain medicines you are taking that might be lowering your blood pressure.  Wearing compression stockings. These stockings help to prevent blood clots and reduce swelling in your legs. In some cases, you may need to go to the hospital for:  Fluid replacement. This means you will receive fluids through an IV tube.  Blood replacement. This means you will receive donated blood through an IV tube (transfusion).  Treating an infection or heart problems, if this applies.  Monitoring. You may need to be monitored while medicines that you are taking wear off. Follow these instructions at home: Eating and drinking    Drink enough fluid to keep your urine clear or pale yellow.  Eat a healthy diet and follow instructions from your health care provider about eating or drinking restrictions. A healthy diet includes:  Fresh fruits and vegetables.  Whole grains.  Lean meats.  Low-fat dairy products.  Eat extra salt only as directed. Do not add extra salt to your diet unless your health care provider told you to do that.  Eat frequent, small meals.  Avoid standing up suddenly after eating. Medicines   Take over-the-counter and prescription medicines only as told by your health care provider.  Follow instructions from your health care provider about changing the dosage of your current medicines, if this applies.  Do not stop or adjust any of your medicines on your own. General instructions   Wear compression stockings as told by your health care provider.  Get up slowly from lying down or sitting positions. This gives your blood pressure a chance to adjust.  Avoid hot showers and excessive heat as directed by your health care provider.  Return to your normal activities as told by your health care provider. Ask your health care provider what activities are safe for you.  Do not use  any products that contain nicotine or tobacco, such as cigarettes and e-cigarettes. If you need help quitting, ask your health care provider.  Keep all follow-up visits as told by your health care provider. This is important. Contact a health care provider if:  You vomit.  You have diarrhea.  You have a fever for more than 2-3 days.  You feel more thirsty than usual.  You feel weak and tired. Get help right away if:  You have chest pain.  You have a fast or irregular heartbeat.  You develop numbness in any part of your body.  You cannot move your arms or your legs.  You have trouble speaking.  You become sweaty or feel lightheaded.  You faint.  You feel short of breath.  You have trouble staying awake.  You feel confused. This information is not intended to replace advice given to you by your health care provider. Make sure you discuss any questions you have with your health care provider. Document Released: 08/12/2002 Document Revised: 05/10/2016 Document Reviewed: 02/12/2016 Elsevier Interactive Patient Education  2017 [employer].

## 2016-12-04 NOTE — Evaluation (Signed)
Occupational Therapy Evaluation Patient Details Name: Ana Salazar MRN: [id number] DOB: 1946/05/10 Today's Date: 12/04/2016    History of Present Illness Patient is a 71 y/o female with bilateral DBS placed 3/28 presents from home with falls, weakness, incontinence and hallucinations. Head CT-unremarkable. EEG normal. Plan for surgery on 4/3 for implantable pulse generator. PMH includes PD, CVA, HTN, depression, CHF, asthma.   Clinical Impression   .Pt admitted with above and demonstrates the below listed deficits.   She requires min guard - min A for ADLs and is at risk for falls.  Family reports they will be able to provide 24 hour supervision/assist at discharge.  Pt for discharge today.  Recommend HHOT for continued OT as pt was very independent PTA.  All further OT needs can be addressed by [facility].     Follow Up Recommendations  Home health OT;Supervision/Assistance - 24 hour    Equipment Recommendations  None recommended by OT    Recommendations for Other Services       Precautions / Restrictions Precautions Precautions: Fall Precaution Comments: orthostasis and h/o falls  Restrictions Weight Bearing Restrictions: No      Mobility Bed Mobility               General bed mobility comments: up in chair   Transfers Overall transfer level: Needs assistance Equipment used: None Transfers: Sit to/from Stand;Stand Pivot Transfers Sit to Stand: Supervision Stand pivot transfers: Min guard       General transfer comment: min guard for balance     Balance Overall balance assessment: Needs assistance Sitting-balance support: Feet supported;No upper extremity supported Sitting balance-Leahy Scale: Good     Standing balance support: During functional activity Standing balance-Leahy Scale: Fair                             ADL either performed or assessed with clinical judgement   ADL Overall ADL's : Needs assistance/impaired Eating/Feeding:  Independent   Grooming: Wash/dry hands;Wash/dry face;Oral care;Brushing hair;Min guard;Standing   Upper Body Bathing: Set up;Sitting   Lower Body Bathing: Min guard;Sit to/from stand   Upper Body Dressing : Set up;Sitting   Lower Body Dressing: Min guard;Sit to/from stand   Toilet Transfer: Min guard;Ambulation;Comfort height toilet;RW   Toileting- Water quality scientist and Hygiene: Min guard;Sit to/from stand       Functional mobility during ADLs: Min guard;Rolling walker General ADL Comments: Pt unsteady which becomes worse when distracted      Vision   Vision Assessment?: Yes Eye Alignment: Within Functional Limits Ocular Range of Motion: Within Functional Limits Alignment/Gaze Preference: Within Defined Limits Tracking/Visual Pursuits: Able to track stimulus in all quads without difficulty Saccades: Within functional limits Visual Fields: No apparent deficits     Agricultural engineer Tested?: Yes   Praxis Praxis Praxis tested?: Within functional limits    Pertinent Vitals/Pain Pain Assessment: No/denies pain     Hand Dominance Right   Extremity/Trunk Assessment Upper Extremity Assessment Upper Extremity Assessment: RUE deficits/detail RUE Deficits / Details: grossly 4/5.  Pt with tremor       Cervical / Trunk Assessment Cervical / Trunk Assessment: Kyphotic   Communication Communication Communication: No difficulties   Cognition Arousal/Alertness: Awake/alert Behavior During Therapy: WFL for tasks assessed/performed Overall Cognitive Status: Impaired/Different from baseline Area of Impairment: Attention;Memory;Awareness;Problem solving                   Current Attention Level: Selective Memory: Decreased short-term  memory Following Commands: Follows one step commands consistently Safety/Judgement: Decreased awareness of deficits Awareness: Intellectual Problem Solving: Slow processing;Difficulty sequencing;Requires verbal  cues General Comments: Pt able to perform serial counting to and from 100 by 2's while ambulating with min errors, but loses balance frequently     General Comments  VSS     Exercises     Shoulder Instructions      Home Living Family/patient expects to be discharged to:: Private residence Living Arrangements: Children;Other relatives (sister ) Available Help at Discharge: Family;Available 24 hours/day Type of Home: House Home Access: Stairs to enter [employer] of Steps: 2 Entrance Stairs-Rails: Right Home Layout: One level     Bathroom Shower/Tub: Occupational psychologist: Handicapped height     Home Equipment: Environmental consultant - 2 wheels;Cane - single point;Bedside commode;Shower seat;Wheelchair - manual   Additional Comments: dtr and sister       Prior Functioning/Environment Level of Independence: Independent with assistive device(s)        Comments: Reports independence with ADls, does not do any IADLs. Drives. Grocery shops. Goes to the gym 3 days/week prior to recent surgery- walks, weights, punching bags. Reports multiple falls since surgery with LEs "giving out."        OT Problem List: Decreased strength;Impaired balance (sitting and/or standing);Decreased cognition;Decreased safety awareness;Decreased activity tolerance      OT Treatment/Interventions: Self-care/ADL training;DME and/or AE instruction;Therapeutic activities;Patient/family education;Balance training    OT Goals(Current goals can be found in the care plan section)    OT Frequency:     Barriers to D/C:            Co-evaluation              End of Session Equipment Utilized During Treatment: Gait belt Nurse Communication: Mobility status;Precautions  Activity Tolerance: Patient tolerated treatment well Patient left: in chair;with call bell/phone within reach;with chair alarm set  OT Visit Diagnosis: Unsteadiness on feet (R26.81)                Time: 2500-3704 OT  Time Calculation (min): 22 min Charges:  OT General Charges $OT Visit: 1 Procedure OT Evaluation $OT Eval Moderate Complexity: 1 Procedure G-Codes:     Lucille Passy, OTR/L [phone]   Lucille Passy M 12/04/2016, 12:33 PM

## 2016-12-04 NOTE — Progress Notes (Deleted)
Physician Discharge Summary  Rakesha Dalporto MRN: [id number] DOB/AGE: May 19, 1946 71 y.o.  PCP: [name], MD   Admit date: 12/02/2016 Discharge date: 12/04/2016  Discharge Diagnoses:    Principal Problem:   Generalized weakness Active Problems:   Parkinson's disease ([place])   Acute hypokalemia   Uncontrolled hypertension   Altered mental status    Follow-up recommendations Follow-up with PCP in 3-5 days , including all  additional recommended appointments as below Follow-up CBC, CMP in 3-5 days Patient is being discharged home with home health patient needs to follow-up with her neurologist to discuss her orthostatic hypotension and discuss further treatment options       Current Discharge Medication List    START taking these medications   Details  midodrine (PROAMATINE) 5 MG tablet Take 1 tablet (5 mg total) by mouth 3 (three) times daily with meals. Qty: 90 tablet, Refills: 1      CONTINUE these medications which have NOT CHANGED   Details  albuterol (PROVENTIL HFA;VENTOLIN HFA) 108 (90 Base) MCG/ACT inhaler Inhale 1 puff into the lungs every 6 (six) hours as needed for wheezing or shortness of breath.    carbidopa-levodopa (SINEMET CR) 50-200 MG tablet Take 1 tablet by mouth at bedtime. Qty: 90 tablet, Refills: 1    carbidopa-levodopa (SINEMET IR) 25-100 MG tablet 2 tablets at 7 AM/2 tablet at 10 AM//2 tablets at 1 PM/1 tablet at 4 PM and one tablet at 7 PM Qty: 240 tablet, Refills: 5    citalopram (CELEXA) 20 MG tablet Take 10 mg by mouth daily.     clonazePAM (KLONOPIN) 0.5 MG tablet TAKE 1/2 TABLET BY MOUTH AT BEDTIME Qty: 15 tablet, Refills: 5    CRANBERRY PO Take 1 tablet by mouth daily.     docusate sodium (COLACE) 100 MG capsule Take 100 mg by mouth daily as needed for mild constipation.     entacapone (COMTAN) 200 MG tablet TAKE 1 TABLET THREE TIMES PER DAY Qty: 90 tablet, Refills: 5    Fluticasone Furoate (ARNUITY ELLIPTA) 100 MCG/ACT AEPB  Inhale 1 puff into the lungs daily.     furosemide (LASIX) 20 MG tablet TAKE 1/2 TABLET DAILY AS NEEDED FOR SIGNIFICANT FLUID SWELLING ONLY    potassium chloride (K-DUR,KLOR-CON) 10 MEQ tablet Take 10 mEq by mouth daily at 12 noon.     verapamil (CALAN) 80 MG tablet Take 80 mg by mouth 3 (three) times daily.    atropine 1 % ophthalmic solution Use as directed Qty: 2 mL, Refills: 2         Discharge Condition: Stable   Discharge Instructions Get Medicines reviewed and adjusted: Please take all your medications with you for your next visit with your Primary MD  Please request your Primary MD to go over all hospital tests and procedure/radiological results at the follow up, please ask your Primary MD to get all Hospital records sent to his/her office.  If you experience worsening of your admission symptoms, develop shortness of breath, life threatening emergency, suicidal or homicidal thoughts you must seek medical attention immediately by calling 911 or calling your MD immediately if symptoms less severe.  You must read complete instructions/literature along with all the possible adverse reactions/side effects for all the Medicines you take and that have been prescribed to you. Take any new Medicines after you have completely understood and accpet all the possible adverse reactions/side effects.   Do not drive when taking Pain medications.   Do not take more than prescribed  Pain, Sleep and Anxiety Medications  Special Instructions: If you have smoked or chewed Tobacco in the last 2 yrs please stop smoking, stop any regular Alcohol and or any Recreational drug use.  Wear Seat belts while driving.  Please note  You were cared for by a hospitalist during your hospital stay. Once you are discharged, your primary care physician will handle any further medical issues. Please note that NO REFILLS for any discharge medications will be authorized once you are discharged, as it is  imperative that you return to your primary care physician (or establish a relationship with a primary care physician if you do not have one) for your aftercare needs so that they can reassess your need for medications and monitor your lab values.  Discharge Instructions    Diet - low sodium heart healthy    Complete by:  As directed    Increase activity slowly    Complete by:  As directed        Allergies  Allergen Reactions  . Amoxicillin Anaphylaxis, Swelling and Rash    Has patient had a PCN reaction causing immediate rash, facial/tongue/throat swelling, SOB or lightheadedness with hypotension: Yes PCN reaction causing SEVERE RASH INVOLVING MUCUS MEMBRANES or SKIN NECROSIS: Yes Has patient had a PCN reaction that required hospitalization: No Has patient had a PCN reaction occurring within the last 10 years: Yes   . Prednisone Shortness Of Breath and Nausea Only    Hypertension   . Amlodipine Itching and Other (See Comments)    BURNING  . Levothyroxine Other (See Comments)    Tired, shaky, tremors  . Meloxicam Swelling    SWELLING REACTION UNSPECIFIED  "Burning "  . Nebivolol Rash  . Nsaids Nausea And Vomiting  . Valsartan Itching      Disposition: 01-Home or Self Care   Consults:  Neurology neurosurgery    Significant Diagnostic Studies:  Ct Head Wo Contrast  Result Date: 12/03/2016 CLINICAL DATA:  Acute onset of altered mental status. Initial encounter. EXAM: CT HEAD WITHOUT CONTRAST TECHNIQUE: Contiguous axial images were obtained from the base of the skull through the vertex without intravenous contrast. COMPARISON:  CT of the head performed 12/02/2016, and MRI of the brain performed 09/13/2016 FINDINGS: Brain: No evidence of acute infarction, hemorrhage, hydrocephalus, extra-axial collection or mass lesion/mass effect. Scattered periventricular and subcortical white matter change likely reflects small vessel ischemic microangiopathy. Bilateral subthalamic  stimulator leads are noted. The brainstem and fourth ventricle are within normal limits. The basal ganglia are unremarkable in appearance. The cerebral hemispheres demonstrate grossly normal gray-white differentiation. No mass effect or midline shift is seen. Vascular: No hyperdense vessel or unexpected calcification. Skull: There is no evidence of fracture; postoperative change is noted at the high frontal calvarium bilaterally, with minimal pneumocephalus and associated leads. Sinuses/Orbits: The visualized portions of the orbits are within normal limits. A mucus retention cyst or polyp is noted at the right maxillary sinus. There is opacification of the mastoid air cells bilaterally. The remaining paranasal sinuses are well-aerated. Other: No additional soft tissue abnormalities are seen. IMPRESSION: 1. No acute intracranial pathology seen on CT. 2. Bilateral subthalamic stimulator leads noted, with associated postoperative change. 3. Scattered small vessel ischemic microangiopathy. 4. Mucus retention cyst or polyp at the right maxillary sinus. Opacification of the mastoid air cells bilaterally. Electronically Signed   By: Garald Balding M.D.   On: 12/03/2016 20:45   Ct Head Wo Contrast  Result Date: 12/02/2016 CLINICAL DATA:  Postop deep brain  stimulators.  Weakness in legs. EXAM: CT HEAD WITHOUT CONTRAST TECHNIQUE: Contiguous axial images were obtained from the base of the skull through the vertex without intravenous contrast. COMPARISON:  11/29/2016 FINDINGS: Brain: Bifrontal stimulators are noted in place with the tips in the region of the thalami bilaterally. No hemorrhage. No acute infarction. No hydrocephalus. Small amount of pneumocephalus anteriorly. Vascular: No hyperdense vessel or unexpected calcification. Skull: No acute calvarial abnormality. Sinuses/Orbits: Visualized paranasal sinuses and mastoids clear. Orbital soft tissues unremarkable. Other: None IMPRESSION: Bilateral deep brain  stimulators in place entering in the frontal regions bilaterally with the tips in the region of the thalamus. Small amount of pneumocephalus. No hemorrhage. Electronically Signed   By: Rolm Baptise M.D.   On: 12/02/2016 13:04   Ct Head Wo Contrast  Result Date: 11/29/2016 CLINICAL DATA:  Status post deep brain stimulator placement. EXAM: CT HEAD WITHOUT CONTRAST TECHNIQUE: Contiguous axial images were obtained from the base of the skull through the vertex without intravenous contrast. COMPARISON:  11/22/2016 FINDINGS: Brain: New bilateral deep brain stimulator via burr holes. The leads appear symmetrically placed. Expected pneumocephalus. No intracranial hemorrhage, swelling, or infarct. No intraventricular extension. Vascular: Atherosclerotic calcification. Skull: Burr holes and expected scalp gas and soft tissue swelling. Sinuses/Orbits: Chronic opacification of bilateral mastoid and middle ear spaces. Polyp or retention cyst in the right maxillary antrum. IMPRESSION: No unexpected finding after deep brain stimulator placement. Electronically Signed   By: Monte Fantasia M.D.   On: 11/29/2016 15:41   Ct Head Wo Contrast  Result Date: 11/29/2016 CLINICAL DATA:  71 year old female. Parkinson disease. Preoperative study for deep brain stimulator placement, skull fiducials in place. EXAM: CT HEAD WITHOUT CONTRAST TECHNIQUE: Contiguous axial images were obtained from the base of the skull through the vertex without intravenous contrast. COMPARISON:  Brain MRI 09/13/2016 and earlier. FINDINGS: Brain: Mild motion artifact, but not significantly affecting imaging of the brain parenchyma. Stable cerebral volume. Patchy and confluent cerebral white matter hypodensity corresponding to T2/FLAIR changes on the previous MRIs. No midline shift, ventriculomegaly, mass effect, evidence of mass lesion, intracranial hemorrhage or evidence of cortically based acute infarction. No cortical encephalomalacia. Vascular: Calcified  atherosclerosis at the skull base. Skull: Multiple skull fiducials in place. The anterior fiducials are slightly affected by motion artifact. No other acute osseous abnormality. Sinuses/Orbits: Chronic right maxillary mucous retention cyst. Chronic right mastoid effusions. Other: Negative orbit soft tissues. Mild postoperative scalp soft tissue changes from fiducial placement. IMPRESSION: 1. Study for stereotactic surgical planning with multiple skull fiducials in place. 2. No acute intracranial abnormality. Stable brain with chronic nonspecific white matter changes. Electronically Signed   By: Genevie Ann M.D.   On: 11/22/2016 11:23   Dg Chest Port 1 View  Result Date: 12/02/2016 CLINICAL DATA:  Weakness starting last week, history of pneumonia EXAM: PORTABLE CHEST 1 VIEW COMPARISON:  10/14/2014 FINDINGS: Cardiomediastinal silhouette is stable. No infiltrate or pleural effusion. No pulmonary edema. Bony thorax is unremarkable. IMPRESSION: No active disease. Electronically Signed   By: Lahoma Crocker M.D.   On: 12/02/2016 14:33        Filed Weights   12/02/16 1120 12/02/16 1600  Weight: 76.2 kg (168 lb) 77.5 kg (170 lb 14.4 oz)     Microbiology: Recent Results (from the past 240 hour(s))  Culture, Urine     Status: None   Collection Time: 12/02/16  2:00 PM  Result Value Ref Range Status   Specimen Description URINE, RANDOM  Final   Special Requests NONE  Final   Culture NO GROWTH  Final   Report Status 12/03/2016 FINAL  Final  Surgical pcr screen     Status: None   Collection Time: 12/03/16 12:34 PM  Result Value Ref Range Status   MRSA, PCR NEGATIVE NEGATIVE Final   Staphylococcus aureus NEGATIVE NEGATIVE Final    Comment:        The Xpert SA Assay (FDA approved for NASAL specimens in patients over 69 years of age), is one component of a comprehensive surveillance program.  Test performance has been validated by [facility] for patients greater than or equal to 45 year old. It is not  intended to diagnose infection nor to guide or monitor treatment.        Blood Culture    Component Value Date/Time   SDES URINE, RANDOM 12/02/2016 1400   SPECREQUEST NONE 12/02/2016 1400   CULT NO GROWTH 12/02/2016 1400   REPTSTATUS 12/03/2016 FINAL 12/02/2016 1400      Labs: Results for orders placed or performed during the hospital encounter of 12/02/16 (from the past 48 hour(s))  I-Stat CG4 Lactic Acid, ED     Status: None   Collection Time: 12/02/16 12:06 PM  Result Value Ref Range   Lactic Acid, Venous 1.64 0.5 - 1.9 mmol/L  Urinalysis, Routine w reflex microscopic     Status: Abnormal   Collection Time: 12/02/16 12:21 PM  Result Value Ref Range   Color, Urine AMBER (A) YELLOW    Comment: BIOCHEMICALS MAY BE AFFECTED BY COLOR   APPearance CLEAR CLEAR   Specific Gravity, Urine 1.026 1.005 - 1.030   pH 5.0 5.0 - 8.0   Glucose, UA NEGATIVE NEGATIVE mg/dL   Hgb urine dipstick SMALL (A) NEGATIVE   Bilirubin Urine SMALL (A) NEGATIVE   Ketones, ur 5 (A) NEGATIVE mg/dL   Protein, ur NEGATIVE NEGATIVE mg/dL   Nitrite NEGATIVE NEGATIVE   Leukocytes, UA NEGATIVE NEGATIVE   RBC / HPF 6-30 0 - 5 RBC/hpf   WBC, UA 0-5 0 - 5 WBC/hpf   Bacteria, UA NONE SEEN NONE SEEN   Squamous Epithelial / LPF NONE SEEN NONE SEEN   Mucous PRESENT    Ca Oxalate Crys, UA PRESENT   Magnesium     Status: None   Collection Time: 12/02/16  1:57 PM  Result Value Ref Range   Magnesium 1.9 1.7 - 2.4 mg/dL  Phosphorus     Status: None   Collection Time: 12/02/16  1:57 PM  Result Value Ref Range   Phosphorus 2.7 2.5 - 4.6 mg/dL  Culture, Urine     Status: None   Collection Time: 12/02/16  2:00 PM  Result Value Ref Range   Specimen Description URINE, RANDOM    Special Requests NONE    Culture NO GROWTH    Report Status 12/03/2016 FINAL   Lactic acid, plasma     Status: None   Collection Time: 12/02/16  2:07 PM  Result Value Ref Range   Lactic Acid, Venous 0.8 0.5 - 1.9 mmol/L  TSH      Status: None   Collection Time: 12/02/16  2:39 PM  Result Value Ref Range   TSH 3.241 0.350 - 4.500 uIU/mL    Comment: Performed by a 3rd Generation assay with a functional sensitivity of <=0.01 uIU/mL.  CBC     Status: Abnormal   Collection Time: 12/03/16  4:16 AM  Result Value Ref Range   WBC 5.5 4.0 - 10.5 K/uL   RBC 3.57 (L)  3.87 - 5.11 MIL/uL   Hemoglobin 10.8 (L) 12.0 - 15.0 g/dL   HCT 33.1 (L) 36.0 - 46.0 %   MCV 92.7 78.0 - 100.0 fL   MCH 30.3 26.0 - 34.0 pg   MCHC 32.6 30.0 - 36.0 g/dL   RDW 14.1 11.5 - 15.5 %   Platelets 136 (L) 150 - 400 K/uL  Comprehensive metabolic panel     Status: Abnormal   Collection Time: 12/03/16  4:16 AM  Result Value Ref Range   Sodium 139 135 - 145 mmol/L   Potassium 3.4 (L) 3.5 - 5.1 mmol/L   Chloride 104 101 - 111 mmol/L   CO2 29 22 - 32 mmol/L   Glucose, Bld 95 65 - 99 mg/dL   BUN 19 6 - 20 mg/dL   Creatinine, Ser 0.64 0.44 - 1.00 mg/dL   Calcium 9.2 8.9 - 10.3 mg/dL   Total Protein 5.6 (L) 6.5 - 8.1 g/dL   Albumin 3.1 (L) 3.5 - 5.0 g/dL   AST 9 (L) 15 - 41 U/L   ALT <5 (L) 14 - 54 U/L   Alkaline Phosphatase 71 38 - 126 U/L   Total Bilirubin 0.7 0.3 - 1.2 mg/dL   GFR calc non Af Amer >60 >60 mL/min   GFR calc Af Amer >60 >60 mL/min    Comment: (NOTE) The eGFR has been calculated using the CKD EPI equation. This calculation has not been validated in all clinical situations. eGFR's persistently <60 mL/min signify possible Chronic Kidney Disease.    Anion gap 6 5 - 15  Surgical pcr screen     Status: None   Collection Time: 12/03/16 12:34 PM  Result Value Ref Range   MRSA, PCR NEGATIVE NEGATIVE   Staphylococcus aureus NEGATIVE NEGATIVE    Comment:        The Xpert SA Assay (FDA approved for NASAL specimens in patients over 2 years of age), is one component of a comprehensive surveillance program.  Test performance has been validated by [facility] for patients greater than or equal to 92 year old. It is not intended to  diagnose infection nor to guide or monitor treatment.   Basic metabolic panel     Status: Abnormal   Collection Time: 12/04/16 10:14 AM  Result Value Ref Range   Sodium 141 135 - 145 mmol/L   Potassium 3.9 3.5 - 5.1 mmol/L   Chloride 106 101 - 111 mmol/L   CO2 30 22 - 32 mmol/L   Glucose, Bld 116 (H) 65 - 99 mg/dL   BUN 17 6 - 20 mg/dL   Creatinine, Ser 0.83 0.44 - 1.00 mg/dL   Calcium 9.3 8.9 - 10.3 mg/dL   GFR calc non Af Amer >60 >60 mL/min   GFR calc Af Amer >60 >60 mL/min    Comment: (NOTE) The eGFR has been calculated using the CKD EPI equation. This calculation has not been validated in all clinical situations. eGFR's persistently <60 mL/min signify possible Chronic Kidney Disease.    Anion gap 5 5 - 15  Magnesium     Status: None   Collection Time: 12/04/16 10:14 AM  Result Value Ref Range   Magnesium 1.7 1.7 - 2.4 mg/dL     Lipid Panel  No results found for: CHOL, TRIG, HDL, CHOLHDL, VLDL, LDLCALC, LDLDIRECT   No results found for: HGBA1C   Lab Results  Component Value Date   CREATININE 0.83 12/04/2016     HPI : *  71 y.o.femalewith  medical history significant for hypertension and Parkinson's disease. Patient currently is undergoing a staged process for bilateral deep brain stimulator implantation. Recent procedure was on 3/27 with a discharge date of 3/28. Since discharge patient has had someissues with transient confusion and generalized weakness (primarily involving the legs). Now found to have altered mental status and orthostatic hypotension  HOSPITAL COURSE:    Generalized weakness/underlyingParkinson's disease  -Presents with reports of generalized weakness primarily with standing and involving the legs without witnessed seizure activity or any focal neurological findings on exam with negative CT of the head-symptoms have developed since surgical procedure for implantation of brain stimulator -Neurosurgery/Stern following -Found to be positively  orthostatic 3/31 , Lasix held initially patient hydrated with IV fluids and orthostatics have somewhat improved -UA negative this admission, recently treated on 3/6 for Klebsiella UTI resistant to ampicillin. Repeat UA negative -Continue preadmission Sinemet and Comtan-dosage/timing adjusted by pharmacy -Continue preadmission medications for symptomatic management: Celexa and Klonopin -PT/OT evaluation-home health   TSH, okay CXR No active disease. EEG This normal EEG is recorded in the waking and sleepstate. There was no seizure or seizure predisposition recorded on this study.  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 rate 18, height _0  (1.651 m), weight 77.5 kg (170 lb 14.4 oz), SpO2 96 %.   General exam: Appears calm and comfortable  Respiratory system: Clear to auscultation. Respiratory effort normal. Cardiovascular system: S1 & S2 heard, RRR. No JVD, murmurs, rubs, gallops or clicks. No pedal edema. Gastrointestinal system: Abdomen is nondistended, soft and nontender. No organomegaly or masses felt. Normal bowel sounds heard. Central nervous system: Surgical scar on her for head clean. Extremities: Symmetric 5 x 5 power. Skin: No rashes, lesions or ulcers Psychiatry: Judgement and insight appear normal. Mood & affect  appropriate     Signed: Faduma Cho 12/04/2016, 11:53 AM        Time spent >45 mins

## 2016-12-05 ENCOUNTER — Telehealth: Payer: Self-pay | Admitting: Neurology

## 2016-12-05 LAB — CG4 I-STAT (LACTIC ACID): Lactic Acid, Venous: 0.8 mmol/L (ref 0.5–1.9)

## 2016-12-05 NOTE — Telephone Encounter (Signed)
Please call there are some questions about dosage of the citalopram

## 2016-12-05 NOTE — Telephone Encounter (Signed)
Spoke with sister and made her aware we don't prescribe this medication.

## 2016-12-06 ENCOUNTER — Encounter (HOSPITAL_COMMUNITY)
Admission: RE | Disposition: A | Discharge status: Home / Self Care | Payer: Self-pay | Source: Ambulatory Visit | Attending: Neurosurgery

## 2016-12-06 ENCOUNTER — Telehealth: Payer: Self-pay | Admitting: Neurology

## 2016-12-06 ENCOUNTER — Ambulatory Visit (HOSPITAL_COMMUNITY): Payer: Medicare Other | Admitting: Anesthesiology

## 2016-12-06 ENCOUNTER — Encounter (HOSPITAL_COMMUNITY): Payer: Self-pay | Admitting: *Deleted

## 2016-12-06 ENCOUNTER — Observation Stay (HOSPITAL_COMMUNITY)
Admission: RE | Admit: 2016-12-06 | Discharge: 2016-12-07 | Disposition: A | Discharge status: Home / Self Care | Payer: Medicare Other | Source: Ambulatory Visit | Attending: Neurosurgery | Admitting: Neurosurgery

## 2016-12-06 DIAGNOSIS — R2681 Unsteadiness on feet: Secondary | ICD-10-CM | POA: Insufficient documentation

## 2016-12-06 DIAGNOSIS — R531 Weakness: Secondary | ICD-10-CM | POA: Diagnosis not present

## 2016-12-06 DIAGNOSIS — G20 Parkinson's disease: Secondary | ICD-10-CM | POA: Diagnosis present

## 2016-12-06 DIAGNOSIS — Z87891 Personal history of nicotine dependence: Secondary | ICD-10-CM | POA: Diagnosis not present

## 2016-12-06 DIAGNOSIS — Z79899 Other long term (current) drug therapy: Secondary | ICD-10-CM | POA: Insufficient documentation

## 2016-12-06 DIAGNOSIS — R200 Anesthesia of skin: Secondary | ICD-10-CM | POA: Diagnosis not present

## 2016-12-06 DIAGNOSIS — I10 Essential (primary) hypertension: Secondary | ICD-10-CM | POA: Diagnosis not present

## 2016-12-06 HISTORY — DX: Anxiety disorder, unspecified: F41.9

## 2016-12-06 HISTORY — PX: PULSE GENERATOR IMPLANT: SHX5370

## 2016-12-06 HISTORY — DX: Edema, unspecified: R60.9

## 2016-12-06 HISTORY — DX: Localized edema: R60.0

## 2016-12-06 SURGERY — UNILATERAL PULSE GENERATOR IMPLANT
Anesthesia: Monitor Anesthesia Care | Site: Head | Laterality: Right

## 2016-12-06 MED ORDER — BUPIVACAINE HCL (PF) 0.5 % IJ SOLN
INTRAMUSCULAR | Status: AC
  Filled 2016-12-06: qty 30

## 2016-12-06 MED ORDER — PHENYLEPHRINE 40 MCG/ML (10ML) SYRINGE FOR IV PUSH (FOR BLOOD PRESSURE SUPPORT)
PREFILLED_SYRINGE | INTRAVENOUS | Status: AC
  Filled 2016-12-06: qty 10

## 2016-12-06 MED ORDER — BISACODYL 10 MG RE SUPP: 10.0000 mg | Freq: Every day | RECTAL | Status: DC | PRN

## 2016-12-06 MED ORDER — FLEET ENEMA 7-19 GM/118ML RE ENEM: 1.0000 | ENEMA | Freq: Once | RECTAL | Status: DC | PRN

## 2016-12-06 MED ORDER — FUROSEMIDE 20 MG PO TABS
20.0000 mg | ORAL_TABLET | Freq: Every day | ORAL | Status: DC
  Filled 2016-12-06: qty 1

## 2016-12-06 MED ORDER — SUGAMMADEX SODIUM 200 MG/2ML IV SOLN
INTRAVENOUS | Status: DC | PRN
  Administered 2016-12-06: 160 mg via INTRAVENOUS

## 2016-12-06 MED ORDER — ROCURONIUM BROMIDE 50 MG/5ML IV SOSY
PREFILLED_SYRINGE | INTRAVENOUS | Status: AC
  Filled 2016-12-06: qty 5

## 2016-12-06 MED ORDER — VERAPAMIL HCL 80 MG PO TABS
80.0000 mg | ORAL_TABLET | Freq: Three times a day (TID) | ORAL | Status: DC
  Administered 2016-12-06 – 2016-12-07 (×3): 80 mg via ORAL
  Filled 2016-12-06 (×4): qty 1

## 2016-12-06 MED ORDER — BACITRACIN ZINC 500 UNIT/GM EX OINT
TOPICAL_OINTMENT | CUTANEOUS | Status: AC
  Filled 2016-12-06: qty 28.35

## 2016-12-06 MED ORDER — CARBIDOPA-LEVODOPA 25-100 MG PO TABS
2.0000 | ORAL_TABLET | Freq: Three times a day (TID) | ORAL | Status: DC
  Administered 2016-12-06 – 2016-12-07 (×3): 2 via ORAL
  Filled 2016-12-06 (×4): qty 2

## 2016-12-06 MED ORDER — PROPOFOL 1000 MG/100ML IV EMUL
INTRAVENOUS | Status: AC
  Filled 2016-12-06: qty 200

## 2016-12-06 MED ORDER — PROPOFOL 10 MG/ML IV BOLUS
INTRAVENOUS | Status: AC
  Filled 2016-12-06: qty 20

## 2016-12-06 MED ORDER — CITALOPRAM HYDROBROMIDE 10 MG PO TABS
10.0000 mg | ORAL_TABLET | Freq: Every day | ORAL | Status: DC
  Administered 2016-12-07: 10 mg via ORAL
  Filled 2016-12-06: qty 1

## 2016-12-06 MED ORDER — 0.9 % SODIUM CHLORIDE (POUR BTL) OPTIME
TOPICAL | Status: DC | PRN
  Administered 2016-12-06: 1000 mL

## 2016-12-06 MED ORDER — LIDOCAINE-EPINEPHRINE 2 %-1:100000 IJ SOLN
INTRAMUSCULAR | Status: AC
  Filled 2016-12-06: qty 1

## 2016-12-06 MED ORDER — LACTATED RINGERS IV SOLN
INTRAVENOUS | Status: DC | PRN
  Administered 2016-12-06 (×2): via INTRAVENOUS

## 2016-12-06 MED ORDER — OXYCODONE HCL 5 MG PO TABS: 5.0000 mg | ORAL_TABLET | ORAL | Status: DC | PRN

## 2016-12-06 MED ORDER — DOCUSATE SODIUM 100 MG PO CAPS
100.0000 mg | ORAL_CAPSULE | Freq: Two times a day (BID) | ORAL | Status: DC
  Administered 2016-12-06 – 2016-12-07 (×2): 100 mg via ORAL
  Filled 2016-12-06 (×2): qty 1

## 2016-12-06 MED ORDER — DOCUSATE SODIUM 100 MG PO CAPS: 100.0000 mg | ORAL_CAPSULE | Freq: Every day | ORAL | Status: DC | PRN

## 2016-12-06 MED ORDER — FENTANYL CITRATE (PF) 250 MCG/5ML IJ SOLN
INTRAMUSCULAR | Status: AC
  Filled 2016-12-06: qty 5

## 2016-12-06 MED ORDER — SUGAMMADEX SODIUM 500 MG/5ML IV SOLN
INTRAVENOUS | Status: AC
  Filled 2016-12-06: qty 5

## 2016-12-06 MED ORDER — ROCURONIUM 10MG/ML (10ML) SYRINGE FOR MEDFUSION PUMP - OPTIME
INTRAVENOUS | Status: DC | PRN
  Administered 2016-12-06: 30 mg via INTRAVENOUS
  Administered 2016-12-06: 50 mg via INTRAVENOUS

## 2016-12-06 MED ORDER — EPHEDRINE 5 MG/ML INJ
INTRAVENOUS | Status: AC
  Filled 2016-12-06: qty 10

## 2016-12-06 MED ORDER — ACETAMINOPHEN 650 MG RE SUPP: 650.0000 mg | RECTAL | Status: DC | PRN

## 2016-12-06 MED ORDER — ATROPINE SULFATE 1 % OP SOLN
1.0000 [drp] | Freq: Three times a day (TID) | OPHTHALMIC | Status: DC
  Administered 2016-12-06 (×2): 1 [drp] via OPHTHALMIC
  Filled 2016-12-06: qty 2

## 2016-12-06 MED ORDER — CARBIDOPA-LEVODOPA 25-100 MG PO TABS
1.0000 | ORAL_TABLET | Freq: Every day | ORAL | Status: DC
  Administered 2016-12-06: 1 via ORAL
  Filled 2016-12-06 (×2): qty 1

## 2016-12-06 MED ORDER — POLYETHYLENE GLYCOL 3350 17 G PO PACK: 17.0000 g | PACK | Freq: Every day | ORAL | Status: DC | PRN

## 2016-12-06 MED ORDER — VANCOMYCIN HCL 1000 MG IV SOLR
INTRAVENOUS | Status: DC | PRN
  Administered 2016-12-06: 1000 mg via INTRAVENOUS

## 2016-12-06 MED ORDER — VANCOMYCIN HCL 1000 MG IV SOLR
INTRAVENOUS | Status: DC | PRN
  Administered 2016-12-06: 1000 mg

## 2016-12-06 MED ORDER — PROPOFOL 10 MG/ML IV BOLUS
INTRAVENOUS | Status: DC | PRN
  Administered 2016-12-06: 140 mg via INTRAVENOUS

## 2016-12-06 MED ORDER — OXYCODONE HCL 5 MG PO TABS: 5.0000 mg | ORAL_TABLET | Freq: Once | ORAL | Status: DC | PRN

## 2016-12-06 MED ORDER — SUGAMMADEX SODIUM 200 MG/2ML IV SOLN
INTRAVENOUS | Status: AC
  Filled 2016-12-06: qty 2

## 2016-12-06 MED ORDER — CLONAZEPAM 0.5 MG PO TABS
0.2500 mg | ORAL_TABLET | Freq: Every day | ORAL | Status: DC
  Administered 2016-12-06: 0.25 mg via ORAL
  Filled 2016-12-06: qty 1

## 2016-12-06 MED ORDER — BUDESONIDE 0.25 MG/2ML IN SUSP
0.2500 mg | Freq: Two times a day (BID) | RESPIRATORY_TRACT | Status: DC
  Filled 2016-12-06 (×2): qty 2

## 2016-12-06 MED ORDER — LIDOCAINE HCL (CARDIAC) 20 MG/ML IV SOLN
INTRAVENOUS | Status: DC | PRN
  Administered 2016-12-06: 40 mg via INTRATRACHEAL

## 2016-12-06 MED ORDER — MIDAZOLAM HCL 2 MG/2ML IJ SOLN
INTRAMUSCULAR | Status: AC
  Filled 2016-12-06: qty 2

## 2016-12-06 MED ORDER — LIDOCAINE 2% (20 MG/ML) 5 ML SYRINGE
INTRAMUSCULAR | Status: AC
  Filled 2016-12-06: qty 5

## 2016-12-06 MED ORDER — HYDRALAZINE HCL 20 MG/ML IJ SOLN
20.0000 mg | INTRAMUSCULAR | Status: DC | PRN
  Administered 2016-12-06: 20 mg via INTRAVENOUS
  Filled 2016-12-06: qty 1

## 2016-12-06 MED ORDER — ALBUTEROL SULFATE (2.5 MG/3ML) 0.083% IN NEBU: 2.5000 mg | INHALATION_SOLUTION | Freq: Four times a day (QID) | RESPIRATORY_TRACT | Status: DC | PRN

## 2016-12-06 MED ORDER — ACETAMINOPHEN 325 MG PO TABS: 650.0000 mg | ORAL_TABLET | ORAL | Status: DC | PRN

## 2016-12-06 MED ORDER — PHENOL 1.4 % MT LIQD
1.0000 | OROMUCOSAL | Status: DC | PRN
Start: 2016-12-06 — End: 2016-12-07

## 2016-12-06 MED ORDER — LIDOCAINE-EPINEPHRINE 2 %-1:100000 IJ SOLN
INTRAMUSCULAR | Status: DC | PRN
  Administered 2016-12-06: 10 mL

## 2016-12-06 MED ORDER — ONDANSETRON HCL 4 MG PO TABS: 4.0000 mg | ORAL_TABLET | Freq: Four times a day (QID) | ORAL | Status: DC | PRN

## 2016-12-06 MED ORDER — OXYCODONE HCL 5 MG/5ML PO SOLN: 5.0000 mg | Freq: Once | ORAL | Status: DC | PRN

## 2016-12-06 MED ORDER — ENTACAPONE 200 MG PO TABS
200.0000 mg | ORAL_TABLET | Freq: Three times a day (TID) | ORAL | Status: DC
  Administered 2016-12-06 – 2016-12-07 (×3): 200 mg via ORAL
  Filled 2016-12-06 (×4): qty 1

## 2016-12-06 MED ORDER — VANCOMYCIN HCL IN DEXTROSE 1-5 GM/200ML-% IV SOLN
1000.0000 mg | Freq: Once | INTRAVENOUS | Status: AC
  Administered 2016-12-06: 1000 mg via INTRAVENOUS
  Filled 2016-12-06: qty 200

## 2016-12-06 MED ORDER — VANCOMYCIN HCL IN DEXTROSE 1-5 GM/200ML-% IV SOLN
INTRAVENOUS | Status: AC
  Filled 2016-12-06: qty 200

## 2016-12-06 MED ORDER — FENTANYL CITRATE (PF) 100 MCG/2ML IJ SOLN: 25.0000 ug | INTRAMUSCULAR | Status: DC | PRN

## 2016-12-06 MED ORDER — VANCOMYCIN HCL 1000 MG IV SOLR
INTRAVENOUS | Status: AC
  Filled 2016-12-06: qty 1000

## 2016-12-06 MED ORDER — EPHEDRINE SULFATE 50 MG/ML IJ SOLN
INTRAMUSCULAR | Status: DC | PRN
  Administered 2016-12-06: 10 mg via INTRAVENOUS

## 2016-12-06 MED ORDER — SODIUM CHLORIDE 0.9% FLUSH
3.0000 mL | Freq: Two times a day (BID) | INTRAVENOUS | Status: DC
  Administered 2016-12-06 (×2): 3 mL via INTRAVENOUS

## 2016-12-06 MED ORDER — MIDODRINE HCL 5 MG PO TABS
5.0000 mg | ORAL_TABLET | Freq: Three times a day (TID) | ORAL | Status: DC
  Filled 2016-12-06 (×5): qty 1

## 2016-12-06 MED ORDER — MENTHOL 3 MG MT LOZG: 1.0000 | LOZENGE | OROMUCOSAL | Status: DC | PRN

## 2016-12-06 MED ORDER — ONDANSETRON HCL 4 MG/2ML IJ SOLN
INTRAMUSCULAR | Status: DC | PRN
  Administered 2016-12-06: 4 mg via INTRAVENOUS

## 2016-12-06 MED ORDER — BUPIVACAINE HCL (PF) 0.5 % IJ SOLN
INTRAMUSCULAR | Status: DC | PRN
  Administered 2016-12-06: 10 mL

## 2016-12-06 MED ORDER — ONDANSETRON HCL 4 MG/2ML IJ SOLN: 4.0000 mg | Freq: Once | INTRAMUSCULAR | Status: DC | PRN

## 2016-12-06 MED ORDER — CRANBERRY 250 MG PO TABS: ORAL_TABLET | Freq: Every day | ORAL | Status: DC

## 2016-12-06 MED ORDER — SODIUM CHLORIDE 0.9% FLUSH: 3.0000 mL | INTRAVENOUS | Status: DC | PRN

## 2016-12-06 MED ORDER — POTASSIUM CHLORIDE CRYS ER 10 MEQ PO TBCR
10.0000 meq | EXTENDED_RELEASE_TABLET | Freq: Every day | ORAL | Status: DC
  Administered 2016-12-06 – 2016-12-07 (×2): 10 meq via ORAL
  Filled 2016-12-06 (×2): qty 1

## 2016-12-06 MED ORDER — CARBIDOPA-LEVODOPA ER 50-200 MG PO TBCR
1.0000 | EXTENDED_RELEASE_TABLET | Freq: Every day | ORAL | Status: DC
Start: 2016-12-06 — End: 2016-12-07
  Administered 2016-12-06: 1 via ORAL
  Filled 2016-12-06: qty 1

## 2016-12-06 MED ORDER — ONDANSETRON HCL 4 MG/2ML IJ SOLN: 4.0000 mg | Freq: Four times a day (QID) | INTRAMUSCULAR | Status: DC | PRN

## 2016-12-06 MED ORDER — KCL IN DEXTROSE-NACL 20-5-0.45 MEQ/L-%-% IV SOLN: INTRAVENOUS | Status: DC

## 2016-12-06 MED ORDER — ONDANSETRON HCL 4 MG/2ML IJ SOLN
INTRAMUSCULAR | Status: AC
  Filled 2016-12-06: qty 2

## 2016-12-06 MED ORDER — FENTANYL CITRATE (PF) 100 MCG/2ML IJ SOLN
INTRAMUSCULAR | Status: DC | PRN
  Administered 2016-12-06: 50 ug via INTRAVENOUS
  Administered 2016-12-06: 100 ug via INTRAVENOUS
  Administered 2016-12-06 (×2): 50 ug via INTRAVENOUS

## 2016-12-06 MED ORDER — HYDROCODONE-ACETAMINOPHEN 5-325 MG PO TABS
1.0000 | ORAL_TABLET | ORAL | Status: DC | PRN
Start: 2016-12-06 — End: 2016-12-07
  Administered 2016-12-06 (×2): 1 via ORAL
  Filled 2016-12-06 (×2): qty 1

## 2016-12-06 SURGICAL SUPPLY — 31 items
BANDAGE ADH SHEER 1  50/CT (GAUZE/BANDAGES/DRESSINGS) IMPLANT
BLADE CLIPPER SURG (BLADE) IMPLANT
CANISTER SUCT 3000ML PPV (MISCELLANEOUS) ×3 IMPLANT
CHARGING SYSTEM VERCISE (MISCELLANEOUS) ×2
COVER BACK TABLE 60X90IN (DRAPES) ×3 IMPLANT
DECANTER SPIKE VIAL GLASS SM (MISCELLANEOUS) ×3 IMPLANT
DERMABOND ADVANCED (GAUZE/BANDAGES/DRESSINGS) ×2
DERMABOND ADVANCED .7 DNX12 (GAUZE/BANDAGES/DRESSINGS) ×1 IMPLANT
DRSG OPSITE POSTOP 4X6 (GAUZE/BANDAGES/DRESSINGS) ×6 IMPLANT
DRSG TELFA 3X8 NADH (GAUZE/BANDAGES/DRESSINGS) ×3 IMPLANT
DURAPREP 26ML APPLICATOR (WOUND CARE) ×3 IMPLANT
GAUZE SPONGE 4X4 16PLY XRAY LF (GAUZE/BANDAGES/DRESSINGS) IMPLANT
GLOVE BIO SURGEON STRL SZ8 (GLOVE) ×3 IMPLANT
GLOVE ECLIPSE 8.0 STRL XLNG CF (GLOVE) ×3 IMPLANT
GLOVE INDICATOR 8.0 STRL GRN (GLOVE) ×3 IMPLANT
GLOVE INDICATOR 8.5 STRL (GLOVE) ×3 IMPLANT
KIT BASIN OR (CUSTOM PROCEDURE TRAY) ×3 IMPLANT
KIT CONTACT EXTENSION 55CMX8 (Miscellaneous) ×6 IMPLANT
KIT IMPLANT PULSE GENERATOR (Generator) ×2 IMPLANT
KIT REMOTE CONTROL VERCISE (MISCELLANEOUS) ×3 IMPLANT
KIT REMOVER STAPLE SKIN (MISCELLANEOUS) ×3 IMPLANT
MARKER SKIN DUAL TIP RULER LAB (MISCELLANEOUS) ×3 IMPLANT
NEEDLE HYPO 25X1 1.5 SAFETY (NEEDLE) ×3 IMPLANT
NEEDLE SPNL 18GX3.5 QUINCKE PK (NEEDLE) IMPLANT
NEEDLE SPNL 22GX3.5 QUINCKE BK (NEEDLE) IMPLANT
PACK LAMINECTOMY NEURO (CUSTOM PROCEDURE TRAY) ×3 IMPLANT
PAD ARMBOARD 7.5X6 YLW CONV (MISCELLANEOUS) ×6 IMPLANT
STAPLER SKIN PROX WIDE 3.9 (STAPLE) ×3 IMPLANT
SUT ETHILON 3 0 PS 1 (SUTURE) IMPLANT
SUT VIC AB 2-0 CP2 18 (SUTURE) ×3 IMPLANT
SYSTEM CHARGING VERCISE (MISCELLANEOUS) ×1 IMPLANT

## 2016-12-06 NOTE — Telephone Encounter (Signed)
Patient admitted for overnight observation. Will touch base with her and family tomorrow.

## 2016-12-06 NOTE — Interval H&P Note (Signed)
History and Physical Interval Note:  12/06/2016 7:33 AM  Ana Salazar  has presented today for surgery, with the diagnosis of Parkinsons Disease  The various methods of treatment have been discussed with the patient and family. After consideration of risks, benefits and other options for treatment, the patient has consented to  Procedure(s) with comments: Unilateral PULSE GENERATOR IMPLANT (Bilateral) - Unilateral PULSE GENERATOR IMPLANT as a surgical intervention .  The patient's history has been reviewed, patient examined, no change in status, stable for surgery.  I have reviewed the patient's chart and labs.  Questions were answered to the patient's satisfaction.     Ronella Plunk D

## 2016-12-06 NOTE — Clinical Social Work Note (Addendum)
CSW received call from unit nurse regarding patient and need for ST rehab. CSW received chart and learned that patient is Medicare A&B and Observation status. CSW and nurse case manager also checked prior admissions and patient does not have a qualifying stay. CSW visited room and spoke with patient (sitting up in a chair) and her sister, Rod Holler. They were informed that patient does not qualify for her insurance to pay for ST rehab due to observation status. Sister asked about home health services as they thought it had been set-up during her last hospital stay, and was informed that this information will be shared with nurse case manager to check on. CSW will assist if any SW services needed prior to discharge.  Erskin Zinda Givens, MSW, LCSW Licensed Clinical Social Worker [place] [phone]

## 2016-12-06 NOTE — Anesthesia Postprocedure Evaluation (Addendum)
Anesthesia Post Note  Patient: Ana Salazar  Procedure(s) Performed: Procedure(s) (LRB): Unilateral PULSE GENERATOR IMPLANT (Right)  Patient location during evaluation: PACU Anesthesia Type: MAC Level of consciousness: awake, awake and alert and oriented Pain management: pain level controlled Vital Signs Assessment: post-procedure vital signs reviewed and stable Respiratory status: nonlabored ventilation, spontaneous breathing and respiratory function stable Cardiovascular status: blood pressure returned to baseline Anesthetic complications: no       Last Vitals:  Vitals:   12/06/16 0947 12/06/16 1003  BP: (!) 155/68 (!) 150/69  Pulse: (!) 58 (!) 56  Resp: 16 15  Temp:      Last Pain:  Vitals:   12/06/16 0603  TempSrc: Oral                 Shalinda Burkholder COKER

## 2016-12-06 NOTE — Telephone Encounter (Signed)
Reviewed hospital records.  Family called via answering service last night re: gait instability and falls.  Pt needs to use walker at all times.  Talked with Dr. Vertell Limber today.  States that there is some family dynamics at play.  Family wants her to go to SNF rehab but patient refuses and patient was refusing to use walker.  She will be admitted today for battery placement.  Will be d/c tomorrow.  Jade, please get home health set up for patient once gets back to her home tomorrow.  Let pt/family know (once pt d/c)

## 2016-12-06 NOTE — Anesthesia Preprocedure Evaluation (Addendum)
Anesthesia Evaluation  Patient identified by MRN, date of birth, ID band Patient awake    Reviewed: Allergy & Precautions, NPO status , Patient's Chart, lab work & pertinent test results  Airway Mallampati: II  TM Distance: >3 FB Neck ROM: Full    Dental  (+) Teeth Intact, Dental Advisory Given   Pulmonary former smoker,    breath sounds clear to auscultation       Cardiovascular hypertension,  Rhythm:Regular Rate:Normal     Neuro/Psych    GI/Hepatic   Endo/Other    Renal/GU      Musculoskeletal   Abdominal   Peds  Hematology   Anesthesia Other Findings   Reproductive/Obstetrics                            Anesthesia Physical Anesthesia Plan  ASA: III  Anesthesia Plan: MAC   Post-op Pain Management:    Induction: Intravenous  Airway Management Planned: Natural Airway and Nasal Cannula  Additional Equipment:   Intra-op Plan:   Post-operative Plan: Extubation in OR  Informed Consent: I have reviewed the patients History and Physical, chart, labs and discussed the procedure including the risks, benefits and alternatives for the proposed anesthesia with the patient or authorized representative who has indicated his/her understanding and acceptance.   Dental advisory given  Plan Discussed with: CRNA and Anesthesiologist  Anesthesia Plan Comments:        Anesthesia Quick Evaluation

## 2016-12-06 NOTE — Brief Op Note (Signed)
12/06/2016  9:16 AM  PATIENT:  Ana Salazar  71 y.o. female  PRE-OPERATIVE DIAGNOSIS:  Parkinsons Disease  POST-OPERATIVE DIAGNOSIS:  Parkinsons Disease  PROCEDURE:  Procedure(s) with comments: Unilateral PULSE GENERATOR IMPLANT (Right) - Unilateral PULSE GENERATOR IMPLANT for bilateral DBS Stn electrodes with extensions  SURGEON:  Surgeon(s) and Role:    * Erline Levine, MD - Primary  PHYSICIAN ASSISTANT:   ASSISTANTS: none   ANESTHESIA:   general  EBL:  Total I/O In: 1000 [I.V.:1000] Out: 20 [Blood:20]  BLOOD ADMINISTERED:none  DRAINS: none   LOCAL MEDICATIONS USED:  MARCAINE    and LIDOCAINE   SPECIMEN:  No Specimen  DISPOSITION OF SPECIMEN:  N/A  COUNTS:  YES  TOURNIQUET:  * No tourniquets in log *  DICTATION: DICTATION: Patient has implanted bilateral subthalamic stimulator electrodes having recently completed DBS Stage I and now presents for placement of lead extensions and IPG implantation.  PROCEDURE: Patient was brought to the operating room and GETA anesthesia was induced.  Right upper chest, scalp, neck were prepped with betadine scrub and Duraprep.  Area of planned incision was infiltrated with lidocaine.  Scalp incision was made and the lead extensions were exposed. An incision was made in the right upper chest and a pocket was created.  Extension tunnel was made from scalp to pocket.  [employer] IPG was placed and attached to lead extensions, which in turn were connected to cranial leads and torqued appropriately.      The IPG  was placed in the pocket and 2, 2-0 silk stay sutures were placed.  Wounds were irrigated with vancomycin.  Incisions were closed with 2-0 Vicryl and 3-0 vicryl sutures at the pocket and 2-0 vicryl at the scalp with staples. and dressed with a sterile occlusive dressing.  Counts were correct at the end of the case. Impedences were within normal range for all contacts.  PLAN OF CARE: Admit for overnight observation  PATIENT  DISPOSITION:  PACU - hemodynamically stable.   Delay start of Pharmacological VTE agent (>24hrs) due to surgical blood loss or risk of bleeding: yes

## 2016-12-06 NOTE — Anesthesia Procedure Notes (Signed)
Procedure Name: Intubation Date/Time: 12/06/2016 7:41 AM Performed by: Lance Coon Pre-anesthesia Checklist: Patient identified, Emergency Drugs available, Suction available, Patient being monitored and Timeout performed Patient Re-evaluated:Patient Re-evaluated prior to inductionOxygen Delivery Method: Circle system utilized Preoxygenation: Pre-oxygenation with 100% oxygen Intubation Type: IV induction Ventilation: Mask ventilation without difficulty Laryngoscope Size: Miller and 3 Grade View: Grade I Tube type: Oral Tube size: 7.5 mm Number of attempts: 1 Airway Equipment and Method: Stylet Placement Confirmation: ETT inserted through vocal cords under direct vision,  positive ETCO2 and breath sounds checked- equal and bilateral Secured at: 20 cm Tube secured with: Tape Dental Injury: Teeth and Oropharynx as per pre-operative assessment

## 2016-12-06 NOTE — Evaluation (Signed)
Physical Therapy Evaluation Patient Details Name: Rexine Gowens MRN: [id number] DOB: 01-Feb-1946 Today's Date: 12/06/2016   History of Present Illness  Pt is a 71 y/o female s/p unilateral pulse generator implant on the R. PMH includes parkinsons disease, HTN, CVA, depression, CHF, and asthma.   Clinical Impression  Pt is s/p surgery above with deficits below. PTA, pt reporting she was independent with mobility and was able to perform ADLs. Unsure if present cognitive status is baseline, will need to follow up with family. Per RN, pt has had multiple falls at home. Upon evaluation, pt demonstrating decreased steadiness and strength during mobility tasks. Required min to mod A for mobility tasks secondary to decreased balance. Pt reporting her family is not available 24/7 and currently pt requires 24/7 assist with mobility for increased safety with mobility. Recommending SNF at d/c based on current deficits. If SNF is not an option, pt will required HHPT, BSC, and 24 hour close supervision for safety at home. Will continue to follow to maximize functional mobility independence and update recommendations based on pt progress.     Follow Up Recommendations SNF;Supervision/Assistance - 24 hour    Equipment Recommendations  3in1 (PT)    Recommendations for Other Services       Precautions / Restrictions Precautions Precautions: Fall Restrictions Weight Bearing Restrictions: No      Mobility  Bed Mobility Overal bed mobility: Needs Assistance Bed Mobility: Rolling;Sidelying to Sit Rolling: Min assist Sidelying to sit: Mod assist;HOB elevated       General bed mobility comments: Mod A for trunk elevation. Relied heavily on bed rails for bed mobility and verbal cues for sequencing for log roll technique.   Transfers Overall transfer level: Needs assistance Equipment used: Rolling walker (2 wheeled) Transfers: Sit to/from Stand Sit to Stand: Mod assist         General transfer  comment: mod A for lift assist. Verbal cues for appropriate hand placement prior to transfer.  Ambulation/Gait Ambulation/Gait assistance: Min assist;Mod assist Ambulation Distance (Feet): 50 Feet Assistive device: Rolling walker (2 wheeled) Gait Pattern/deviations: Step-through pattern;Decreased stride length;Shuffle;Drifts right/left;Trunk flexed Gait velocity: decreased Gait velocity interpretation: Below normal speed for age/gender General Gait Details: Demonstrating slow, unsteady gait. Pt with periods of festination during gait requiring mod A to prevent LOB. Pt drifting L and R during gait, despite verbal cues to keep straight path. Required mod A for RW management, especially during turns.   Stairs            Wheelchair Mobility    Modified Rankin (Stroke Patients Only)       Balance Overall balance assessment: Needs assistance Sitting-balance support: Feet supported;No upper extremity supported Sitting balance-Leahy Scale: Fair     Standing balance support: Bilateral upper extremity supported;During functional activity Standing balance-Leahy Scale: Poor Standing balance comment: Reliant on RW for steadying and min-mod A to prevent LOB during gait.                              Pertinent Vitals/Pain Pain Assessment: Faces Faces Pain Scale: Hurts a little bit Pain Location: incision at neck  Pain Descriptors / Indicators: Operative site guarding;Sore Pain Intervention(s): Limited activity within patient's tolerance;Monitored during session;Repositioned    Home Living Family/patient expects to be discharged to:: [place]: Children;Other relatives (sister) Available Help at Discharge: Family;Available PRN/intermittently Type of Home: House Home Access: Stairs to enter Entrance Stairs-Rails: Right Entrance Stairs-Number of Steps:  1 Home Layout: One level Home Equipment: Walker - 2 wheels;Cane - single point;Shower  seat - built in;Wheelchair - manual Additional Comments: Pt reports her family is wanting her to go to rehab before she goes home and the pt is agreeable.     Prior Function Level of Independence: Independent         Comments: Reports that she was not using any AD to walk at home and was able to perform ADL tasks. Per prior admission PT notes, pt has reported multiple falls at home.      Hand Dominance   Dominant Hand: Right    Extremity/Trunk Assessment   Upper Extremity Assessment Upper Extremity Assessment: Defer to OT evaluation    Lower Extremity Assessment Lower Extremity Assessment: Generalized weakness (grossly 3+/5 throughout LE. )    Cervical / Trunk Assessment Cervical / Trunk Assessment: Kyphotic  Communication   Communication: No difficulties  Cognition Arousal/Alertness: Awake/alert Behavior During Therapy: WFL for tasks assessed/performed;Flat affect Overall Cognitive Status: No family/caregiver present to determine baseline cognitive functioning                                 General Comments: Pt demonstrating slow cognitive processing throughout session, and required increased time to follow commands. No family caregiver present during session to confirm if pt is at baseline.       General Comments General comments (skin integrity, edema, etc.): Pt reporting she is feeling fuzzy headed this session. Pt attempting to go to bathroom during session; required min to mod A for toileting activities and increased time. Pt not present during session, so will need to determine baseline cognitive status when they are available. Pt reporting she is agreeable to go to rehab and family wants her to go to rehab secondary to recent falls at home. Pt's family is also not available 24/7 to assist with mobility.     Exercises     Assessment/Plan    PT Assessment Patient needs continued PT services  PT Problem List Decreased strength;Decreased  balance;Decreased mobility;Decreased cognition;Decreased coordination;Decreased knowledge of use of DME;Decreased safety awareness;Decreased knowledge of precautions       PT Treatment Interventions DME instruction;Gait training;Stair training;Functional mobility training;Therapeutic activities;Therapeutic exercise;Balance training;Neuromuscular re-education;Patient/family education;Cognitive remediation    PT Goals (Current goals can be found in the Care Plan section)  Acute Rehab PT Goals Patient Stated Goal: unable to state this session PT Goal Formulation: With patient Time For Goal Achievement: 12/20/16 Potential to Achieve Goals: Fair    Frequency Min 5X/week   Barriers to discharge Decreased caregiver support Pt family only available intermittently throughout the day and pt needs 24/7 assist with mobility.     Co-evaluation               End of Session Equipment Utilized During Treatment: Gait belt Activity Tolerance: Patient tolerated treatment well Patient left: in chair;with call bell/phone within reach Nurse Communication: Mobility status PT Visit Diagnosis: Unsteadiness on feet (R26.81);Other abnormalities of gait and mobility (R26.89)    Time: 9528-4132 PT Time Calculation (min) (ACUTE ONLY): 26 min   Charges:   PT Evaluation $PT Eval Moderate Complexity: 1 Procedure PT Treatments $Gait Training: 8-22 mins   PT G Codes:   PT G-Codes **NOT FOR INPATIENT CLASS** Functional Assessment Tool Used: AM-PAC 6 Clicks Basic Mobility;Clinical judgement Functional Limitation: Mobility: Walking and moving around Mobility: Walking and Moving Around Current Status (G4010): At least 40  percent but less than 60 percent impaired, limited or restricted Mobility: Walking and Moving Around Goal Status [phone]): At least 1 percent but less than 20 percent impaired, limited or restricted    Nicky Pugh, PT, DPT  Acute Rehabilitation Services  Pager: [phone] 12/06/2016, 1:55 PM

## 2016-12-06 NOTE — Op Note (Signed)
12/06/2016  9:16 AM  PATIENT:  Ana Salazar  71 y.o. female  PRE-OPERATIVE DIAGNOSIS:  Parkinsons Disease  POST-OPERATIVE DIAGNOSIS:  Parkinsons Disease  PROCEDURE:  Procedure(s) with comments: Unilateral PULSE GENERATOR IMPLANT (Right) - Unilateral PULSE GENERATOR IMPLANT for bilateral DBS Stn electrodes with extensions  SURGEON:  Surgeon(s) and Role:    * Erline Levine, MD - Primary  PHYSICIAN ASSISTANT:   ASSISTANTS: none   ANESTHESIA:   general  EBL:  Total I/O In: 1000 [I.V.:1000] Out: 20 [Blood:20]  BLOOD ADMINISTERED:none  DRAINS: none   LOCAL MEDICATIONS USED:  MARCAINE    and LIDOCAINE   SPECIMEN:  No Specimen  DISPOSITION OF SPECIMEN:  N/A  COUNTS:  YES  TOURNIQUET:  * No tourniquets in log *  DICTATION: DICTATION: Patient has implanted bilateral subthalamic stimulator electrodes having recently completed DBS Stage I and now presents for placement of lead extensions and IPG implantation.  PROCEDURE: Patient was brought to the operating room and GETA anesthesia was induced.  Right upper chest, scalp, neck were prepped with betadine scrub and Duraprep.  Area of planned incision was infiltrated with lidocaine.  Scalp incision was made and the lead extensions were exposed. An incision was made in the right upper chest and a pocket was created.  Extension tunnel was made from scalp to pocket.  [employer] IPG was placed and attached to lead extensions, which in turn were connected to cranial leads and torqued appropriately.      The IPG  was placed in the pocket and 2, 2-0 silk stay sutures were placed.  Wounds were irrigated with vancomycin.  Incisions were closed with 2-0 Vicryl and 3-0 vicryl sutures at the pocket and 2-0 vicryl at the scalp with staples. and dressed with a sterile occlusive dressing.  Counts were correct at the end of the case. Impedences were within normal range for all contacts.  PLAN OF CARE: Admit for overnight observation  PATIENT  DISPOSITION:  PACU - hemodynamically stable.   Delay start of Pharmacological VTE agent (>24hrs) due to surgical blood loss or risk of bleeding: yes

## 2016-12-06 NOTE — H&P (View-Only) (Signed)
Subjective: Patient reports doing better.  No hallucinations or falling.  Objective: Vital signs in last 24 hours: Temp:  [97.7 F (36.5 C)-99.4 F (37.4 C)] 98 F (36.7 C) (03/31 1008) Pulse Rate:  [44-60] 52 (03/31 1008) Resp:  [13-30] 18 (03/31 1008) BP: (128-196)/(45-87) 186/74 (03/31 1008) SpO2:  [92 %-98 %] 96 % (03/31 1008) Weight:  [76.2 kg (168 lb)-77.5 kg (170 lb 14.4 oz)] 77.5 kg (170 lb 14.4 oz) (03/30 1600)  Intake/Output from previous day: 03/30 0701 - 03/31 0700 In: 240 [P.O.:240] Out: -  Intake/Output this shift: No intake/output data recorded.  Physical Exam: Patient has not been confused.  No hallucinations.  PT has assessed patient and feels she will be OK at home with visiting nursing.    Lab Results:  Recent Labs  12/02/16 1122 12/03/16 0416  WBC 7.4 5.5  HGB 11.9* 10.8*  HCT 36.5 33.1*  PLT 166 136*   BMET  Recent Labs  12/02/16 1122 12/03/16 0416  NA 140 139  K 3.2* 3.4*  CL 105 104  CO2 27 29  GLUCOSE 111* 95  BUN 19 19  CREATININE 0.80 0.64  CALCIUM 9.5 9.2    Studies/Results: Ct Head Wo Contrast  Result Date: 12/02/2016 CLINICAL DATA:  Postop deep brain stimulators.  Weakness in legs. EXAM: CT HEAD WITHOUT CONTRAST TECHNIQUE: Contiguous axial images were obtained from the base of the skull through the vertex without intravenous contrast. COMPARISON:  11/29/2016 FINDINGS: Brain: Bifrontal stimulators are noted in place with the tips in the region of the thalami bilaterally. No hemorrhage. No acute infarction. No hydrocephalus. Small amount of pneumocephalus anteriorly. Vascular: No hyperdense vessel or unexpected calcification. Skull: No acute calvarial abnormality. Sinuses/Orbits: Visualized paranasal sinuses and mastoids clear. Orbital soft tissues unremarkable. Other: None IMPRESSION: Bilateral deep brain stimulators in place entering in the frontal regions bilaterally with the tips in the region of the thalamus. Small amount of  pneumocephalus. No hemorrhage. Electronically Signed   By: Rolm Baptise M.D.   On: 12/02/2016 13:04   Dg Chest Port 1 View  Result Date: 12/02/2016 CLINICAL DATA:  Weakness starting last week, history of pneumonia EXAM: PORTABLE CHEST 1 VIEW COMPARISON:  10/14/2014 FINDINGS: Cardiomediastinal silhouette is stable. No infiltrate or pleural effusion. No pulmonary edema. Bony thorax is unremarkable. IMPRESSION: No active disease. Electronically Signed   By: Lahoma Crocker M.D.   On: 12/02/2016 14:33    Assessment/Plan: OK to discharge.  Nurse to check orthostatic BP.  Outpatient surgery scheduled for this coming Tuesday.    LOS: 0 days    Peggyann Shoals, MD 12/03/2016, 11:07 AM

## 2016-12-06 NOTE — Progress Notes (Signed)
Pharmacy Antibiotic Note  Ana Salazar is a 71 y.o. female admitted on 12/06/2016 for a planned pulse generator implant. Pharmacy has been consulted for Vancomycin post-op dosing. Per RN report - no drain is in place.   Vancomycin was given earlier today at 0800  Plan: 1. Vancomycin 1g IV x 1 dose post-op at 2000 2. Pharmacy will sign off as no further doses expected at this time.   Height: 5\' 5"  (165.1 cm) Weight: 168 lb (76.2 kg) IBW/kg (Calculated) : 57  Temp (24hrs), Avg:97.9 F (36.6 C), Min:97.2 F (36.2 C), Max:98.5 F (36.9 C)   Recent Labs Lab 12/02/16 1122 12/02/16 1206 12/02/16 1407 12/02/16 1446 12/03/16 0416 12/04/16 1014  WBC 7.4  --   --   --  5.5  --   CREATININE 0.80  --   --   --  0.64 0.83  LATICACIDVEN  --  1.64 0.8 0.80  --   --     Estimated Creatinine Clearance: 66.3 mL/min (by C-G formula based on SCr of 0.83 mg/dL).    Allergies  Allergen Reactions  . Amoxicillin Anaphylaxis, Swelling and Rash    Has patient had a PCN reaction causing immediate rash, facial/tongue/throat swelling, SOB or lightheadedness with hypotension: Yes PCN reaction causing SEVERE RASH INVOLVING MUCUS MEMBRANES or SKIN NECROSIS: Yes Has patient had a PCN reaction that required hospitalization: No Has patient had a PCN reaction occurring within the last 10 years: Yes   . Prednisone Shortness Of Breath and Nausea Only    Hypertension   . Amlodipine Itching and Other (See Comments)    BURNING  . Levothyroxine Other (See Comments)    Tired, shaky, tremors  . Meloxicam Swelling    SWELLING REACTION UNSPECIFIED  "Burning "  . Nebivolol Rash  . Nsaids Nausea And Vomiting  . Valsartan Itching    Thank you for allowing pharmacy to be a part of this patient's care.  Lawson Radar 12/06/2016 11:50 AM

## 2016-12-06 NOTE — Transfer of Care (Signed)
Immediate Anesthesia Transfer of Care Note  Patient: Genoa Freyre  Procedure(s) Performed: Procedure(s) with comments: Unilateral PULSE GENERATOR IMPLANT (Right) - Unilateral PULSE GENERATOR IMPLANT  Patient Location: PACU  Anesthesia Type:General  Level of Consciousness: sedated and patient cooperative  Airway & Oxygen Therapy: Patient Spontanous Breathing and Patient connected to face mask oxygen  Post-op Assessment: Report given to RN and Post -op Vital signs reviewed and stable  Post vital signs: Reviewed and stable  Last Vitals:  Vitals:   12/06/16 0603 12/06/16 0915  BP: (!) 193/78   Pulse: (!) 50   Resp: 18   Temp: 36.9 C 36.2 C    Last Pain:  Vitals:   12/06/16 0603  TempSrc: Oral         Complications: No apparent anesthesia complications

## 2016-12-07 ENCOUNTER — Encounter (HOSPITAL_COMMUNITY): Payer: Self-pay | Admitting: Neurosurgery

## 2016-12-07 DIAGNOSIS — G20 Parkinson's disease: Secondary | ICD-10-CM | POA: Diagnosis not present

## 2016-12-07 NOTE — Progress Notes (Signed)
qPhysical Therapy Treatment Patient Details Name: Ana Salazar MRN: [id number] DOB: 1945/11/20 Today's Date: 12/07/2016    History of Present Illness Pt is a 71 y/o female s/p unilateral pulse generator implant on the R. PMH includes parkinsons disease, HTN, CVA, depression, CHF, and asthma.     PT Comments    Pt progressing towards physical therapy goals. Was able to demonstrate increased independence this session with RW for support, however continue to recommend 24 hour assist for safety. Pt's sister was present during session for education. As functional improvement is seen from initial eval, feel HHPT is appropriate for follow-up at d/c. Will continue to follow and progress as able per POC.   Follow Up Recommendations  Home health PT;Supervision/Assistance - 24 hour     Equipment Recommendations  3in1 (PT);Other (comment) (Rollator)    Recommendations for Other Services       Precautions / Restrictions Precautions Precautions: Fall Precaution Comments: orthostasis and h/o falls  Restrictions Weight Bearing Restrictions: No    Mobility  Bed Mobility Overal bed mobility: Needs Assistance Bed Mobility: Rolling;Sidelying to Sit;Sit to Sidelying Rolling: Supervision Sidelying to sit: Min guard     Sit to sidelying: Min guard General bed mobility comments: VC's for log roll for ease of transfer. Pt's sister present for education.   Transfers Overall transfer level: Needs assistance Equipment used: Rolling walker (2 wheeled) Transfers: Sit to/from Stand Sit to Stand: Min guard         General transfer comment: VC's for hand placement on seated surface for safety. Pt was able to power-up to full stand without difficulty.   Ambulation/Gait Ambulation/Gait assistance: Min assist Ambulation Distance (Feet): 100 Feet Assistive device: Rolling walker (2 wheeled) Gait Pattern/deviations: Step-through pattern;Decreased stride length;Staggering right;Drifts right/left;Trunk  flexed Gait velocity: decreased Gait velocity interpretation: Below normal speed for age/gender General Gait Details: Initially pt ambulating fairly well with close guard for safety and occasional cues for closer walker proximity. As pt fatigued, required more frequent cues for safety, noted veering R and L with walker, and occasional posterior LOB and lateral stagger. Pt was educated in energy conservation techniques.    Stairs            Wheelchair Mobility    Modified Rankin (Stroke Patients Only)       Balance Overall balance assessment: Needs assistance Sitting-balance support: Feet supported;No upper extremity supported Sitting balance-Leahy Scale: Fair     Standing balance support: Bilateral upper extremity supported;During functional activity Standing balance-Leahy Scale: Poor Standing balance comment: Reliant on RW for support                            Cognition Arousal/Alertness: Awake/alert Behavior During Therapy: WFL for tasks assessed/performed;Flat affect Overall Cognitive Status: History of cognitive impairments - at baseline Area of Impairment: Attention;Memory;Following commands;Safety/judgement;Problem solving                 Orientation Level: Disoriented to;Situation Current Attention Level: Selective Memory: Decreased short-term memory;Decreased recall of precautions Following Commands: Follows one step commands consistently;Follows one step commands with increased time Safety/Judgement: Decreased awareness of deficits Awareness: Emergent Problem Solving: Slow processing;Decreased initiation;Difficulty sequencing;Requires verbal cues        Exercises      General Comments        Pertinent Vitals/Pain Pain Assessment: Faces Faces Pain Scale: Hurts little more Pain Location: Head incisions Pain Descriptors / Indicators: Operative site guarding;Sore Pain Intervention(s): Limited activity within patient's  tolerance;Monitored during session;Repositioned    Home Living                      Prior Function            PT Goals (current goals can now be found in the care plan section) Acute Rehab PT Goals Patient Stated Goal: Home at d/c PT Goal Formulation: With patient Time For Goal Achievement: 12/20/16 Potential to Achieve Goals: Fair Progress towards PT goals: Progressing toward goals    Frequency    Min 5X/week      PT Plan Discharge plan needs to be updated    Co-evaluation             End of Session Equipment Utilized During Treatment: Gait belt Activity Tolerance: Patient limited by fatigue Patient left: in chair;with call bell/phone within reach;with family/visitor present Nurse Communication: Mobility status PT Visit Diagnosis: Unsteadiness on feet (R26.81);Other abnormalities of gait and mobility (R26.89)     Time: 1610-9604 PT Time Calculation (min) (ACUTE ONLY): 19 min  Charges:  $Gait Training: 8-22 mins                    G Codes:       Ana Salazar, PT, DPT Acute Rehabilitation Services Pager: [phone]    Thelma Comp 12/07/2016, 10:41 AM

## 2016-12-07 NOTE — Discharge Summary (Signed)
Physician Discharge Summary  Patient ID: Ana Salazar MRN: [id number] DOB/AGE: 04-13-1946 71 y.o.  Admit date: 12/06/2016 Discharge date: 12/07/2016  Admission Diagnoses:Parkinsons Disease    Discharge Diagnoses: Parkinsons Disease s/p Unilateral PULSE GENERATOR IMPLANT (Right) - Unilateral PULSE GENERATOR IMPLANT for bilateral DBS Stn electrodes with extensions     Active Problems:   Parkinson's disease [facility])   Discharged Condition: good  Hospital Course: Ana Salazar was admitted for placement of Implanted Pulse Generator and lead extensions for Deep Brain Stimulator. Following uncomplicated surgery, she recovered well and transferred to [facility] for nursing care. She is mobilizing well. (She continues to report intermittent weakness in knees, requiring stand-by assist for safe ambulation.)  Consults: None  Significant Diagnostic Studies:   Treatments: surgery: Unilateral PULSE GENERATOR IMPLANT (Right) - Unilateral PULSE GENERATOR IMPLANT for bilateral DBS Stn electrodes with extensions    Discharge Exam: Blood pressure (!) 153/81, pulse (!) 55, temperature 98.2 F (36.8 C), temperature source Oral, resp. rate 18, height 5\' 5"  (1.651 m), weight 76.2 kg (168 lb), SpO2 96 %. Alert, conversant. Reports only soreness right chest after leaning into the arms of CNA this morning "when my knee gave out". Incisions well approximated with staples scalp and subcuticular sutures right chest. Sites without erythema, swelling, or drainage. MAEW. Discussion of SNF vs Home Health reveals pt and family have agreed on [place]; but pt promises to use her walker.     Disposition: 01-Home or Self Care  HHPT/aide. Office f/u in 1-2 weeks for staple removal. Planning appt with DrTat May 1st to turn DBS on. Stop Midodrine as BP has been high throughout her stay. Pt should f/u with primary care.        Allergies as of 12/07/2016      Reactions   Amoxicillin Anaphylaxis,  Swelling, Rash   Has patient had a PCN reaction causing immediate rash, facial/tongue/throat swelling, SOB or lightheadedness with hypotension: Yes PCN reaction causing SEVERE RASH INVOLVING MUCUS MEMBRANES or SKIN NECROSIS: Yes Has patient had a PCN reaction that required hospitalization: No Has patient had a PCN reaction occurring within the last 10 years: Yes   Prednisone Shortness Of Breath, Nausea Only   Hypertension    Amlodipine Itching, Other (See Comments)   BURNING   Levothyroxine Other (See Comments)   Tired, shaky, tremors   Meloxicam Swelling   SWELLING REACTION UNSPECIFIED  "Burning "   Nebivolol Rash   Nsaids Nausea And Vomiting   Valsartan Itching      Medication List    STOP taking these medications   midodrine 5 MG tablet Commonly known as:  PROAMATINE     TAKE these medications   albuterol 108 (90 Base) MCG/ACT inhaler Commonly known as:  PROVENTIL HFA;VENTOLIN HFA Inhale 1 puff into the lungs every 6 (six) hours as needed for wheezing or shortness of breath.   ARNUITY ELLIPTA 100 MCG/ACT Aepb Generic drug:  Fluticasone Furoate Inhale 1 puff into the lungs daily.   atropine 1 % ophthalmic solution Use as directed   carbidopa-levodopa 25-100 MG tablet Commonly known as:  SINEMET IR 2 tablets at 7 AM/2 tablet at 10 AM//2 tablets at 1 PM/1 tablet at 4 PM and one tablet at 7 PM What changed:  how much to take  how to take this  when to take this  additional instructions   carbidopa-levodopa 50-200 MG tablet Commonly known as:  SINEMET CR Take 1 tablet by mouth at bedtime. What changed:  Another medication with the same  name was changed. Make sure you understand how and when to take each.   citalopram 20 MG tablet Commonly known as:  CELEXA Take 10 mg by mouth daily.   clonazePAM 0.5 MG tablet Commonly known as:  KLONOPIN TAKE 1/2 TABLET BY MOUTH AT BEDTIME   CRANBERRY PO Take 1 tablet by mouth daily.   docusate sodium 100 MG  capsule Commonly known as:  COLACE Take 100 mg by mouth daily as needed for mild constipation.   entacapone 200 MG tablet Commonly known as:  COMTAN TAKE 1 TABLET THREE TIMES PER DAY   furosemide 20 MG tablet Commonly known as:  LASIX TAKE 1/2 TABLET DAILY AS NEEDED FOR SIGNIFICANT FLUID SWELLING ONLY   potassium chloride 10 MEQ tablet Commonly known as:  K-DUR,KLOR-CON Take 10 mEq by mouth daily at 12 noon.   verapamil 80 MG tablet Commonly known as:  CALAN Take 80 mg by mouth 3 (three) times daily.            Durable Medical Equipment        Start     Ordered   12/07/16 0813  For home use only DME Eelevated commode seat  Once     12/07/16 0812       Signed: Peggyann Shoals, MD 12/07/2016, 8:16 AM

## 2016-12-07 NOTE — Evaluation (Signed)
Occupational Therapy Evaluation Patient Details Name: Ana Salazar MRN: [id number] DOB: Jan 20, 1946 Today's Date: 12/07/2016    History of Present Illness Pt is a 71 y/o female s/p unilateral pulse generator implant on the R. PMH includes parkinsons disease, HTN, CVA, depression, CHF, and asthma.    Clinical Impression   Patient is s/p unilateral pulse generator implant on the R surgery resulting in functional limitations due to the deficits listed below (see OT problem list). Pt noted to have LOB x2 during session with inability to correct LOB without physical (A). Pt with fall since last admission. Family currently leaving hospital again to go get clothes because they forgot to bring them.  Patient will benefit from skilled OT acutely to increase independence and safety with ADLS to allow discharge  hhot.     Follow Up Recommendations  Home health OT    Equipment Recommendations  None recommended by OT    Recommendations for Other Services PT consult     Precautions / Restrictions Precautions Precautions: Fall Precaution Comments: orthostasis and h/o falls - note fall since last admission Restrictions Weight Bearing Restrictions: No Other Position/Activity Restrictions: incisions dry and intact      Mobility Bed Mobility Overal bed mobility: Needs Assistance Bed Mobility: Sit to Supine Rolling: Supervision Sidelying to sit: Min guard   Sit to supine: Supervision Sit to sidelying: Min guard General bed mobility comments: pt completed transfer supervision level   Transfers Overall transfer level: Needs assistance Equipment used: Rolling walker (2 wheeled) Transfers: Sit to/from Stand Sit to Stand: Min guard         General transfer comment: cues for hand placement and guarding posteriorly due to LOB     Balance Overall balance assessment: Needs assistance Sitting-balance support: Feet supported;No upper extremity supported Sitting balance-Leahy Scale: Fair      Standing balance support: Bilateral upper extremity supported;During functional activity Standing balance-Leahy Scale: Poor Standing balance comment: Reliant on RW for support                           ADL either performed or assessed with clinical judgement   ADL       Grooming: Wash/dry hands;Min Teacher, music: Minimal assistance           Functional mobility during ADLs: Minimal assistance General ADL Comments: pt with LOB in bathroom x2 posteriorly  with max (A) to correct LOB     Vision         Perception     Praxis      Pertinent Vitals/Pain Pain Assessment: Faces Faces Pain Scale: Hurts little more Pain Location: Head incisions Pain Descriptors / Indicators: Operative site guarding;Sore Pain Intervention(s): Limited activity within patient's tolerance;Monitored during session;Repositioned     Hand Dominance Right   Extremity/Trunk Assessment Upper Extremity Assessment Upper Extremity Assessment: Overall WFL for tasks assessed   Lower Extremity Assessment Lower Extremity Assessment: Defer to PT evaluation   Cervical / Trunk Assessment Cervical / Trunk Assessment: Kyphotic   Communication Communication Communication: No difficulties   Cognition Arousal/Alertness: Awake/alert Behavior During Therapy: WFL for tasks assessed/performed;Flat affect Overall Cognitive Status: History of cognitive impairments - at baseline Area of Impairment: Attention;Memory                 Orientation Level: Disoriented to;Situation Current Attention Level: Selective Memory: Decreased short-term memory Following  Commands: Follows one step commands consistently;Follows one step commands with increased time Safety/Judgement: Decreased awareness of deficits Awareness: Emergent Problem Solving: Slow processing     General Comments  educated on new linens for bathing and avoid washing directly on incision site     Exercises     Shoulder Instructions      Home Living Family/patient expects to be discharged to:: Private residence Living Arrangements: Children Available Help at Discharge: Family Type of Home: House Home Access: Stairs to enter Technical brewer of Steps: 1 Entrance Stairs-Rails: Right Home Layout: One level     Bathroom Shower/Tub: Occupational psychologist: Handicapped height     Home Equipment: Environmental consultant - 2 wheels;Cane - single point;Shower seat - built in;Wheelchair - manual   Additional Comments: pt reports sister and daughter will be with her 24/7      Prior Functioning/Environment Level of Independence: Independent        Comments: Reports that she was not using any AD to walk at home and was able to perform ADL tasks. Per prior admission PT notes, pt has reported multiple falls at home.         OT Problem List: Decreased strength;Impaired balance (sitting and/or standing);Decreased cognition;Decreased safety awareness;Decreased activity tolerance      OT Treatment/Interventions:      OT Goals(Current goals can be found in the care plan section) Acute Rehab OT Goals Patient Stated Goal: Home at d/c  OT Frequency:     Barriers to D/C:            Co-evaluation              End of Session Equipment Utilized During Treatment: Gait belt Nurse Communication: Mobility status;Precautions  Activity Tolerance: Patient tolerated treatment well Patient left: with call bell/phone within reach;in bed  OT Visit Diagnosis: Unsteadiness on feet (R26.81)                Time: 4917-9150 OT Time Calculation (min): 9 min Charges:  OT General Charges $OT Visit: 1 Procedure OT Evaluation $OT Eval Moderate Complexity: 1 Procedure G-Codes:      Jeri Modena   OTR/L Pager: [phone] Office: [phone] .   Parke Poisson B 12/07/2016, 11:08 AM

## 2016-12-07 NOTE — Progress Notes (Addendum)
Subjective: Patient reports "I feel ok. I'm a little sore where she caught me this morning"  Objective: Vital signs in last 24 hours: Temp:  [97.2 F (36.2 C)-98.6 F (37 C)] 98.2 F (36.8 C) (04/04 0322) Pulse Rate:  [55-63] 55 (04/04 0322) Resp:  [14-24] 18 (04/04 0322) BP: (138-204)/(67-83) 153/81 (04/04 0322) SpO2:  [90 %-98 %] 96 % (04/04 0322)  Intake/Output from previous day: 04/03 0701 - 04/04 0700 In: 1000 [I.V.:1000] Out: 20 [Blood:20] Intake/Output this shift: No intake/output data recorded.  Alert, conversant. Reports only soreness right chest after leaning into the arms of CNA this morning "when my knee gave out". Incisions well approximated with staples scalp and subcuticular sutures right chest. Sites without erythema, swelling, or drainage. MAEW. Discussion of SNF vs Home Health reveals pt and family have agreed on [place]; but pt promises to use her walker.   Lab Results: No results for input(s): WBC, HGB, HCT, PLT in the last 72 hours. BMET  Recent Labs  12/04/16 1014  NA 141  K 3.9  CL 106  CO2 30  GLUCOSE 116*  BUN 17  CREATININE 0.83  CALCIUM 9.3    Studies/Results: No results found.  Assessment/Plan:   LOS: 0 days  Per DrStern, d/c to home. HHPT/aide. Office f/u in 1-2 weeks for staple removal. Planning appt with DrTat May 1st to turn DBS on. Stop Midodrine as BP has been high throughout her stay. Pt should f/u with primary care.    Verdis Prime 12/07/2016, 7:58 AM   Patient is doing well.  Discharge home today.

## 2016-12-07 NOTE — Addendum Note (Signed)
Addended byAnnamaria Helling on: 12/07/2016 02:19 PM   Modules accepted: Orders

## 2016-12-07 NOTE — Progress Notes (Signed)
   12/07/16 1100  OT G-codes **NOT FOR INPATIENT CLASS**  Functional Assessment Tool Used Clinical judgement  Functional Limitation Self care  Self Care Current Status [phone]) [facility]  Self Care Goal Status (C6190) [facility]  Self Care Discharge Status [phone]) [facility]    Jeri Modena   OTR/L Pager: [phone] Office: [phone] .

## 2016-12-07 NOTE — Progress Notes (Signed)
Discharged instruction/education/AVS given to patient with sister at bedside and they both verbalized understanding. No drainage, no swelling, no redness  noted on incision sites. Patient back on her baseline mobility wise. Voiding well. Patient D/C via wheelchair.

## 2016-12-07 NOTE — Telephone Encounter (Signed)
I spoke with patient's daughter and it looks like they were going to order home health from hospital at discharge but they haven't heard yet. I placed order just in case and sent to [facility].

## 2016-12-07 NOTE — Discharge Instructions (Signed)
Wound Care Leave incision open to air. You may shower. Do not scrub directly on incision.  Do not put any creams, lotions, or ointments on incision. Activity Walk each and every day, increasing distance each day.  Diet Resume your normal diet.  Return to Work Will be discussed at your follow up appointment. Call Your Doctor If Any of These Occur Redness, drainage, or swelling at the wound.  Temperature greater than 101 degrees. Severe pain not relieved by pain medication. Incision starts to come apart. Follow Up Appt Call today for appointment  [phone]) or for problems.

## 2016-12-09 NOTE — Discharge Summary (Signed)
Physician Discharge Summary  Ana Salazar MRN: [id number] DOB/AGE: Jul 19, 1946 71 y.o.  PCP: [name], MD   Admit date: 12/02/2016 Discharge date: 12/09/2016  Discharge Diagnoses:    Principal Problem:   Generalized weakness Active Problems:   Parkinson's disease ([place])   Acute hypokalemia   Uncontrolled hypertension   Altered mental status    Follow-up recommendations Follow-up with PCP in 3-5 days , including all  additional recommended appointments as below Follow-up CBC, CMP in 3-5 days Patient is being discharged home with home health patient needs to follow-up with her neurologist to discuss her orthostatic hypotension and discuss further treatment options       Discharge Medication List as of 12/04/2016  1:10 PM    START taking these medications   Details  midodrine (PROAMATINE) 5 MG tablet Take 1 tablet (5 mg total) by mouth 3 (three) times daily with meals., Starting Sun 12/04/2016, Until Tue 01/03/2017, Normal      CONTINUE these medications which have NOT CHANGED   Details  albuterol (PROVENTIL HFA;VENTOLIN HFA) 108 (90 Base) MCG/ACT inhaler Inhale 1 puff into the lungs every 6 (six) hours as needed for wheezing or shortness of breath., Historical Med    carbidopa-levodopa (SINEMET CR) 50-200 MG tablet Take 1 tablet by mouth at bedtime., Starting Tue 09/20/2016, Normal    carbidopa-levodopa (SINEMET IR) 25-100 MG tablet 2 tablets at 7 AM/2 tablet at 10 AM//2 tablets at 1 PM/1 tablet at 4 PM and one tablet at 7 PM, Normal    citalopram (CELEXA) 20 MG tablet Take 10 mg by mouth daily. , Historical Med    clonazePAM (KLONOPIN) 0.5 MG tablet TAKE 1/2 TABLET BY MOUTH AT BEDTIME, Phone In    CRANBERRY PO Take 1 tablet by mouth daily. , Historical Med    docusate sodium (COLACE) 100 MG capsule Take 100 mg by mouth daily as needed for mild constipation. , Historical Med    entacapone (COMTAN) 200 MG tablet TAKE 1 TABLET THREE TIMES PER DAY, Normal     Fluticasone Furoate (ARNUITY ELLIPTA) 100 MCG/ACT AEPB Inhale 1 puff into the lungs daily. , Historical Med    furosemide (LASIX) 20 MG tablet TAKE 1/2 TABLET DAILY AS NEEDED FOR SIGNIFICANT FLUID SWELLING ONLY, Historical Med    potassium chloride (K-DUR,KLOR-CON) 10 MEQ tablet Take 10 mEq by mouth daily at 12 noon. , Historical Med    verapamil (CALAN) 80 MG tablet Take 80 mg by mouth 3 (three) times daily., Historical Med    atropine 1 % ophthalmic solution Use as directed, Normal         Discharge Condition: Stable   Discharge Instructions Get Medicines reviewed and adjusted: Please take all your medications with you for your next visit with your Primary MD  Please request your Primary MD to go over all hospital tests and procedure/radiological results at the follow up, please ask your Primary MD to get all Hospital records sent to his/her office.  If you experience worsening of your admission symptoms, develop shortness of breath, life threatening emergency, suicidal or homicidal thoughts you must seek medical attention immediately by calling 911 or calling your MD immediately if symptoms less severe.  You must read complete instructions/literature along with all the possible adverse reactions/side effects for all the Medicines you take and that have been prescribed to you. Take any new Medicines after you have completely understood and accpet all the possible adverse reactions/side effects.   Do not drive when taking Pain medications.  Do not take more than prescribed Pain, Sleep and Anxiety Medications  Special Instructions: If you have smoked or chewed Tobacco in the last 2 yrs please stop smoking, stop any regular Alcohol and or any Recreational drug use.  Wear Seat belts while driving.  Please note  You were cared for by a hospitalist during your hospital stay. Once you are discharged, your primary care physician will handle any further medical issues. Please note  that NO REFILLS for any discharge medications will be authorized once you are discharged, as it is imperative that you return to your primary care physician (or establish a relationship with a primary care physician if you do not have one) for your aftercare needs so that they can reassess your need for medications and monitor your lab values.  Discharge Instructions    Diet - low sodium heart healthy    Complete by:  As directed    Increase activity slowly    Complete by:  As directed        Allergies  Allergen Reactions  . Amoxicillin Anaphylaxis, Swelling and Rash    Has patient had a PCN reaction causing immediate rash, facial/tongue/throat swelling, SOB or lightheadedness with hypotension: Yes PCN reaction causing SEVERE RASH INVOLVING MUCUS MEMBRANES or SKIN NECROSIS: Yes Has patient had a PCN reaction that required hospitalization: No Has patient had a PCN reaction occurring within the last 10 years: Yes   . Prednisone Shortness Of Breath and Nausea Only    Hypertension   . Amlodipine Itching and Other (See Comments)    BURNING  . Levothyroxine Other (See Comments)    Tired, shaky, tremors  . Meloxicam Swelling    SWELLING REACTION UNSPECIFIED  "Burning "  . Nebivolol Rash  . Nsaids Nausea And Vomiting  . Valsartan Itching      Disposition: 01-Home or Self Care   Consults:  Neurology neurosurgery    Significant Diagnostic Studies:  Ct Head Wo Contrast  Result Date: 12/03/2016 CLINICAL DATA:  Acute onset of altered mental status. Initial encounter. EXAM: CT HEAD WITHOUT CONTRAST TECHNIQUE: Contiguous axial images were obtained from the base of the skull through the vertex without intravenous contrast. COMPARISON:  CT of the head performed 12/02/2016, and MRI of the brain performed 09/13/2016 FINDINGS: Brain: No evidence of acute infarction, hemorrhage, hydrocephalus, extra-axial collection or mass lesion/mass effect. Scattered periventricular and subcortical white  matter change likely reflects small vessel ischemic microangiopathy. Bilateral subthalamic stimulator leads are noted. The brainstem and fourth ventricle are within normal limits. The basal ganglia are unremarkable in appearance. The cerebral hemispheres demonstrate grossly normal gray-white differentiation. No mass effect or midline shift is seen. Vascular: No hyperdense vessel or unexpected calcification. Skull: There is no evidence of fracture; postoperative change is noted at the high frontal calvarium bilaterally, with minimal pneumocephalus and associated leads. Sinuses/Orbits: The visualized portions of the orbits are within normal limits. A mucus retention cyst or polyp is noted at the right maxillary sinus. There is opacification of the mastoid air cells bilaterally. The remaining paranasal sinuses are well-aerated. Other: No additional soft tissue abnormalities are seen. IMPRESSION: 1. No acute intracranial pathology seen on CT. 2. Bilateral subthalamic stimulator leads noted, with associated postoperative change. 3. Scattered small vessel ischemic microangiopathy. 4. Mucus retention cyst or polyp at the right maxillary sinus. Opacification of the mastoid air cells bilaterally. Electronically Signed   By: Garald Balding M.D.   On: 12/03/2016 20:45   Ct Head Wo Contrast  Result Date: 12/02/2016  CLINICAL DATA:  Postop deep brain stimulators.  Weakness in legs. EXAM: CT HEAD WITHOUT CONTRAST TECHNIQUE: Contiguous axial images were obtained from the base of the skull through the vertex without intravenous contrast. COMPARISON:  11/29/2016 FINDINGS: Brain: Bifrontal stimulators are noted in place with the tips in the region of the thalami bilaterally. No hemorrhage. No acute infarction. No hydrocephalus. Small amount of pneumocephalus anteriorly. Vascular: No hyperdense vessel or unexpected calcification. Skull: No acute calvarial abnormality. Sinuses/Orbits: Visualized paranasal sinuses and mastoids clear.  Orbital soft tissues unremarkable. Other: None IMPRESSION: Bilateral deep brain stimulators in place entering in the frontal regions bilaterally with the tips in the region of the thalamus. Small amount of pneumocephalus. No hemorrhage. Electronically Signed   By: Rolm Baptise M.D.   On: 12/02/2016 13:04   Ct Head Wo Contrast  Result Date: 11/29/2016 CLINICAL DATA:  Status post deep brain stimulator placement. EXAM: CT HEAD WITHOUT CONTRAST TECHNIQUE: Contiguous axial images were obtained from the base of the skull through the vertex without intravenous contrast. COMPARISON:  11/22/2016 FINDINGS: Brain: New bilateral deep brain stimulator via burr holes. The leads appear symmetrically placed. Expected pneumocephalus. No intracranial hemorrhage, swelling, or infarct. No intraventricular extension. Vascular: Atherosclerotic calcification. Skull: Burr holes and expected scalp gas and soft tissue swelling. Sinuses/Orbits: Chronic opacification of bilateral mastoid and middle ear spaces. Polyp or retention cyst in the right maxillary antrum. IMPRESSION: No unexpected finding after deep brain stimulator placement. Electronically Signed   By: Monte Fantasia M.D.   On: 11/29/2016 15:41   Ct Head Wo Contrast  Result Date: 11/29/2016 CLINICAL DATA:  71 year old female. Parkinson disease. Preoperative study for deep brain stimulator placement, skull fiducials in place. EXAM: CT HEAD WITHOUT CONTRAST TECHNIQUE: Contiguous axial images were obtained from the base of the skull through the vertex without intravenous contrast. COMPARISON:  Brain MRI 09/13/2016 and earlier. FINDINGS: Brain: Mild motion artifact, but not significantly affecting imaging of the brain parenchyma. Stable cerebral volume. Patchy and confluent cerebral white matter hypodensity corresponding to T2/FLAIR changes on the previous MRIs. No midline shift, ventriculomegaly, mass effect, evidence of mass lesion, intracranial hemorrhage or evidence of  cortically based acute infarction. No cortical encephalomalacia. Vascular: Calcified atherosclerosis at the skull base. Skull: Multiple skull fiducials in place. The anterior fiducials are slightly affected by motion artifact. No other acute osseous abnormality. Sinuses/Orbits: Chronic right maxillary mucous retention cyst. Chronic right mastoid effusions. Other: Negative orbit soft tissues. Mild postoperative scalp soft tissue changes from fiducial placement. IMPRESSION: 1. Study for stereotactic surgical planning with multiple skull fiducials in place. 2. No acute intracranial abnormality. Stable brain with chronic nonspecific white matter changes. Electronically Signed   By: Genevie Ann M.D.   On: 11/22/2016 11:23   Dg Chest Port 1 View  Result Date: 12/02/2016 CLINICAL DATA:  Weakness starting last week, history of pneumonia EXAM: PORTABLE CHEST 1 VIEW COMPARISON:  10/14/2014 FINDINGS: Cardiomediastinal silhouette is stable. No infiltrate or pleural effusion. No pulmonary edema. Bony thorax is unremarkable. IMPRESSION: No active disease. Electronically Signed   By: Lahoma Crocker M.D.   On: 12/02/2016 14:33        Filed Weights   12/02/16 1120 12/02/16 1600  Weight: 76.2 kg (168 lb) 77.5 kg (170 lb 14.4 oz)     Microbiology: Recent Results (from the past 240 hour(s))  Culture, Urine     Status: None   Collection Time: 12/02/16  2:00 PM  Result Value Ref Range Status   Specimen Description URINE, RANDOM  Final   Special Requests NONE  Final   Culture NO GROWTH  Final   Report Status 12/03/2016 FINAL  Final  Surgical pcr screen     Status: None   Collection Time: 12/03/16 12:34 PM  Result Value Ref Range Status   MRSA, PCR NEGATIVE NEGATIVE Final   Staphylococcus aureus NEGATIVE NEGATIVE Final    Comment:        The Xpert SA Assay (FDA approved for NASAL specimens in patients over 84 years of age), is one component of a comprehensive surveillance program.  Test performance has been  validated by [facility] for patients greater than or equal to 32 year old. It is not intended to diagnose infection nor to guide or monitor treatment.        Blood Culture    Component Value Date/Time   SDES URINE, RANDOM 12/02/2016 1400   SPECREQUEST NONE 12/02/2016 1400   CULT NO GROWTH 12/02/2016 1400   REPTSTATUS 12/03/2016 FINAL 12/02/2016 1400      Labs: No results found for this or any previous visit (from the past 48 hour(s)).   Lipid Panel  No results found for: CHOL, TRIG, HDL, CHOLHDL, VLDL, LDLCALC, LDLDIRECT   No results found for: HGBA1C   Lab Results  Component Value Date   CREATININE 0.83 12/04/2016     HPI : *  71 y.o.femalewith medical history significant for hypertension and Parkinson's disease. Patient currently is undergoing a staged process for bilateral deep brain stimulator implantation. Recent procedure was on 3/27 with a discharge date of 3/28. Since discharge patient has had someissues with transient confusion and generalized weakness (primarily involving the legs). Now found to have altered mental status and orthostatic hypotension  HOSPITAL COURSE:    Generalized weakness/underlyingParkinson's disease  -Presents with reports of generalized weakness primarily with standing and involving the legs without witnessed seizure activity or any focal neurological findings on exam with negative CT of the head-symptoms have developed since surgical procedure for implantation of brain stimulator -Neurosurgery/Stern following -Found to be positively orthostatic 3/31 , Lasix held initially patient hydrated with IV fluids and orthostatics have somewhat improved -UA negative this admission, recently treated on 3/6 for Klebsiella UTI resistant to ampicillin. Repeat UA negative -Continue preadmission Sinemet and Comtan-dosage/timing adjusted by pharmacy -Continue preadmission medications for symptomatic management: Celexa and Klonopin -PT/OT  evaluation-home health   TSH, okay CXR No active disease. EEG This normal EEG is recorded in the waking and sleepstate. There was no seizure or seizure predisposition recorded on this study.  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 rate 18, height 5\' 5"  (1.651 m), weight 77.5 kg (170 lb 14.4 oz), SpO2 96 %.   General exam: Appears calm and comfortable  Respiratory system: Clear to auscultation. Respiratory effort normal. Cardiovascular system: S1 & S2 heard, RRR. No JVD, murmurs, rubs, gallops or clicks. No pedal edema. Gastrointestinal system: Abdomen is nondistended, soft and nontender. No organomegaly or  masses felt. Normal bowel sounds heard. Central nervous system: Surgical scar on her for head clean. Extremities: Symmetric 5 x 5 power. Skin: No rashes, lesions or ulcers Psychiatry: Judgement and insight appear normal. Mood & affect appropriate   Follow-up Information    [facility].   Why:   Physical Therapy, Occupational Therapy and aide Contact information: [place] [id number] [phone]           Signed: Reyne Dumas 12/09/2016, 1:17 PM        Time spent >45 mins

## 2016-12-16 ENCOUNTER — Telehealth: Payer: Self-pay | Admitting: Neurology

## 2016-12-16 NOTE — Telephone Encounter (Signed)
Caller: Aaron Edelman ([facility])   Urgent? No  Reason for the call: He is needing verbal orders for therapy to start next week. He did the Evaluation today. Thanks

## 2016-12-16 NOTE — Telephone Encounter (Signed)
LMOM making Aaron Edelman aware okay to proceed with PT.

## 2016-12-21 ENCOUNTER — Telehealth: Payer: Self-pay | Admitting: Neurology

## 2016-12-21 NOTE — Telephone Encounter (Signed)
Spoke with patient because she called in after hours asking to change the dosage times of Entacapone to try to help with her sleepiness.  She was taking at 7 am. 10 am. 1 pm.  She wants to change to 7 am. 1 pm. 7 pm.  She does take Levodopa at those times. She was made aware that she can try to change dosage times to see if it helps (it very well may not) as long as she is taking it with Levodopa.  She expressed understanding.

## 2016-12-30 NOTE — Progress Notes (Signed)
u   Ana Salazar was seen today in the movement disorders clinic for neurologic consultation at the request of [name], [name], MD.   The patient presents today for a second opinion regarding Parkinson's disease. She is accompanied by her daughter and sister who supplement the history.   I have reviewed an extensive number of her records from her prior neurologist, [name], and I appreciate those records.  The patient presented to [name] first in November, 2014, but reported that she had had symptoms for about a year and a half prior to that.  Her first symptoms consisted of difficulty moving her feet/shuffling the feet; trouble getting out of the bed; tremor (she thinks that it started in both but does state that the right is worse than the left and always has been).  The patient was started on a trial of levodopa at her first visit with [name] and followed up with him the next month and noted great improvement with the medication.  She was started on pramipexole the following visit but noted that it made her dizzy and it was discontinued over the telephone.  The following visit, she was started on entacapone 200 mg 3 times a day.  In April, 2015 her carbidopa/levodopa 25/100 was increased to 2 tablets 3 times per day.  In July, 2016 she was changed to carbidopa/levodopa 50/200, 3 times per day.  In October, 2016 she was placed on a combination of carbidopa/levodopa 50/200, 3 times a day (4-5am, noon, 8pm) in addition to carbidopa/levodopa 25/100, 2 tablets 3 times per day (1200 mg of levodopa per day).  She remains on the entacapone, 200 mg 3 times per day as well as primidone, 50 mg - 1.5 tablets at night, which she is using for tremor control.  She reports that her biggest frustration is that she is always moving and is more dizzy and is starting to have a few falls.  When she is nearing end of dose (1-2 hours prior) she will have tremor in her stomach, followed by her legs and arms.    10/13/15  update:  The patient is following care today, primarily for levodopa challenge.  She is currently off of medication.  She is accompanied by her daughter (different daughter than last visit) who supplements the history.  She last took her medication at 8pm.  Her medication generally consists of carbidopa/levodopa 50/200 3 times per day in addition to carbidopa/levodopa 25/100, 1 tablet 6 times per day.  Splitting the medication did help somewhat, but she thinks that it is not helping as much as it does initially when she first started doing it that way.  She is also on entacapone 200 mg 3 times per day and primidone 75 mg at night.  She has not had any falls since our last visit.  01/12/16 update:  The patient is following up today, as she called me to ask me about potentially decreasing her levodopa. She is accompanied by her sister who supplements the history.   She is on a very large dosage and somewhat of a strange combination, but came to me on this combination.  She is currently taking carbidopa/levodopa 50/200 3 times a day (5am/noon/8pm) and is now spreading out her carbidopa/levodopa 25/100 to one tablet 6 times per day (5am is the start and she takes that q 3 hrs until 8 pm).  She remains on entacapone 200 mg 3 times a day and primidone 75 mg daily.  She goes to  bed at about 8pm.  She has found that she is having more dyskinesia.  She has been working with rehabilitation therapist and I have gotten a couple of correspondence is from them that they have been concerned about labored breathing.   I advised follow-up with her primary care physician if this was true shortness of breath.  She did say that she wore a holter monitor for a month and that eval was negative.  Was told if continues needs pulm.  Only SOB when she is hot or exerting herself.  He also mentioned that they were concerned about inability to track inferiorly.  She did see [name] for her neuropsych testing, done on 12/22/2015.  She got  feedback from them on 01/05/2016.  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  I specifically spoke with [name], and she did not feel there was evidence of an atypical state from her testing.  She felt that she could potentially be a good DBS candidate.  She did recommend a vision evaluation, medication to help with anxiety/sleep/irritability, and a follow-up regarding a history of sleep apnea.  Pt states that she was already to the eye doctor in December at [facility].    02/02/16 update:  The patient is following up today, earlier than expected.  She is accompanied by her sister who supplements the history.  Last visit I changed her daytime extended release levodopa all over 2 immediate release.  Therefore, she was taking carbidopa/levodopa 25/100 as follows: 2 at 5am (she is waking up to take that pill and goes to bed until 7am/1 at 8am/1 at 11 am//2 at 2 pm/1 at 5pm and then would take an additional levodopa/levodopa 50/200 at 8pm.  This dropped her overall load of levodopa from 1200 mg to 900 mg, primarily because she was complaining about dyskinesia.  She called me on May 22 and stated that she was doing well initially, but now feels more weak and stiff.  When I asked her specifically, she admitted that she was being treated for urinary tract infection.  I told her to give that some time to resolve, because that can worsen the symptoms of Parkinson's.  She called the following day and requested a follow-up appointment here, which is the reason for follow-up.  She states that she is weaker, which means that the legs are "tired."  She denies freezing.  She states that overall she is more tired.  No falls.  She states that the sx's do not wax and wane throughout the day.  Her sister thinks that she is doing better but sister does state that she is "starting to get that shuffle."  Last PT was in mid April.  C/o nose dripping for years.    05/04/16 update:  The patient follows up today,  accompanied by her sister who supplements the history.  Last visit, the patient wanted to decrease her levodopa load, primarily because of dyskinesia.  We ended up reworking it significantly.  I changed her from the CR formulation to the immediate release formulation and did drop the overall load somewhat.  She is currently taking carbidopa/levodopa 25/100, 2 tablets at 7 AM/2 tablet at 10 AM/2 tablets at 1 PM/1 tablet at 4 PM/carbidopa/levodopa 50/200 at bedtime (8PM).  She cut out the 7pm dosage.    She takes entacapone 3 times per day, which turned out to be every other dose of levodopa.  She is still on primidone, but only on 50 mg  daily, which she was on prior to coming to see me.  She initally states that she isn't better but then states that dyskinesia is "a lot better than it was" which was the primary issue for changing.   She has had several falls that she attributes to looking down when she walks or getting dizzy when she first gets up.  Was able to get off of clonidine and losartan per PCP because of dizziness and that helped dizziness.  She hasn't gotten hurt with falls.   The big issue now is trouble sleeping.  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  She states that she was only using one time per day.  She also told me last visit that she was getting ready to have a sleep study for objective sleep apnea syndrome and a pulmonary consult for shortness of breath.  I do not have that information but she tells me that she does have osas and needs cpap titration and is awaiting that.  Denies depression but daughter thinks that she has it.  Pt doesn't have much to do during the day.  06/06/16 update: The patient follows up today, accompanied by her sister and daughter who supplement the history.  She is currently taking carbidopa/levodopa 25/100, 2 tablets at 7 AM/2 tablet at 10 AM/2 tablets at 1 PM/1 tablet at 4  PM/carbidopa/levodopa 50/200 at bedtime (8PM).  She is on entacapone 200 mg 3 times per day.  States that "my legs are really tired." Thinks that this is mostly in middle of the day/early AM and then "I spend the entire day trying to catch up."   Finds that if she takes an extra carbidopa/levodopa 25/100 in the middle of the night she does better.  Last visit, I discontinued her primidone, 50 mg daily.  She was complaining about vasomotor rhinorrhea last visit, and I told her to increase her atropine drops to 3 times a day as needed.  She states that she hasn't do that.  I started remeron last visit for depression and sleep.  Is still on celexa.  Remeron definitely helped sleep but got CPAP since our last visit as well.  Sister states that she is using that and is now screaming and hollaring and hitting the walls at night, but seems this was going on before CPAP.    07/21/16 update:  Pt returns again for levodopa challenge test.  2 of her daughters accompany her and supplement the history.  She is on carbidopa/levodopa 25/100, 2 tablets at 7 AM/2 tablet at 10 AM//2 tablets at 1 PM/1 tablet at 4 PM and then she will decide if she needs her last one tablet at 7 PM.  She also takes carbidopa/levodopa  50/200 at bedtime, which is 8 PM.  She is on comtan 200 mg tid.  She has been off of all PD med for about 60 hrs for this test.  She does feel more slowly and stiff, although she does feel less dizzy getting off of the medication.  10/25/16 update:  Pt returns today for pre-op visit.  She is accompanied by her daughter who supplements the history.  She is scheduled for DBS surgery next month.  She is on carbidopa/levodopa 25/100, 2/2/2/1 and sometimes will take another in middle of the night.  She takes carbidopa/levodopa 50/200 at bedtime.  She takes entacapone with 3 of the daytime dosages of levodopa.  She has had  some falls because not using her walker, often at night per daughter.  She is loving [employer].  Patient seen today in follow-up.  01/02/17 update:  Patient seen today in follow-up.  She is accompanied by her daughters who supplement the history.  She is status post bilateral STN DBS on 11/29/2016.  She had her generator placed on 12/07/2016.  She was in the hospital between the 2 surgeries with complaints of falls.  I had talked to Dr. Vertell Limber.  The patient had refused to go to subacute nursing facility for rehabilitation.  She had apparently also been refusing to use her walker.  We did get home health involved.  She has had a few falls since home health.  She fell on her butt this weekend and "it was the dogs fault."  Few other falls related to letting go of the walker.  Patient does think some of the falls were related to dizziness.  Her primary care physician has just cut back on her verapamil.  Some of issues that were being reported were due to family stress between patient and her sister.  She is now back on carbidopa/levodopa 25/100, 2/2/2/1 and entacapone with 3 of the dosages.  She is on carbidopa/levodopa 50/200 at bedtime.  She last took her PD med at [place] has not been great.  Family having difficulty understanding her.  Hasn't been doing ST with home health.   ALLERGIES:   Allergies  Allergen Reactions  . Amoxicillin Anaphylaxis, Swelling and Rash    Has patient had a PCN reaction causing immediate rash, facial/tongue/throat swelling, SOB or lightheadedness with hypotension: Yes PCN reaction causing SEVERE RASH INVOLVING MUCUS MEMBRANES or SKIN NECROSIS: Yes Has patient had a PCN reaction that required hospitalization: No Has patient had a PCN reaction occurring within the last 10 years: Yes   . Prednisone Shortness Of Breath and Nausea Only    Hypertension   . Amlodipine Itching and Other (See Comments)    BURNING  . Levothyroxine Other (See Comments)    Tired, shaky, tremors  . Meloxicam Swelling    SWELLING REACTION UNSPECIFIED  "Burning "  . Nebivolol Rash    . Nsaids Nausea And Vomiting  . Valsartan Itching    CURRENT MEDICATIONS:  Outpatient Encounter Prescriptions as of 01/02/2017  Medication Sig  . albuterol (PROVENTIL HFA;VENTOLIN HFA) 108 (90 Base) MCG/ACT inhaler Inhale 1 puff into the lungs every 6 (six) hours as needed for wheezing or shortness of breath.  . carbidopa-levodopa (SINEMET CR) 50-200 MG tablet Take 1 tablet by mouth at bedtime.  . carbidopa-levodopa (SINEMET IR) 25-100 MG tablet 2 tablets at 7 AM/2 tablet at 10 AM//2 tablets at 1 PM/1 tablet at 4 PM and one tablet at 7 PM (Patient taking differently: Take 1-2 tablets by mouth See admin instructions. Pt takes 2 tablets at 0700, 1000, 1300 - pt takes 1 tablet at 1600)  . citalopram (CELEXA) 20 MG tablet Take 10 mg by mouth daily.   . clonazePAM (KLONOPIN) 0.5 MG tablet TAKE 1/2 TABLET BY MOUTH AT BEDTIME  . CRANBERRY PO Take 1 tablet by mouth daily.   Marland Kitchen docusate sodium (COLACE) 100 MG capsule Take 100 mg by mouth daily as needed for mild constipation.   . entacapone (COMTAN) 200 MG tablet TAKE 1 TABLET THREE TIMES PER DAY  . Fluticasone Furoate (ARNUITY ELLIPTA) 100 MCG/ACT AEPB Inhale 1 puff into the lungs daily.   . furosemide (LASIX) 20 MG tablet TAKE 1/2  TABLET DAILY AS NEEDED FOR SIGNIFICANT FLUID SWELLING ONLY  . potassium chloride (K-DUR,KLOR-CON) 10 MEQ tablet Take 10 mEq by mouth daily at 12 noon.   . verapamil (CALAN) 80 MG tablet Take 80 mg by mouth 3 (three) times daily.  . [DISCONTINUED] atropine 1 % ophthalmic solution Use as directed (Patient not taking: Reported on 12/02/2016)   No facility-administered encounter medications on file as of 01/02/2017.     PAST MEDICAL HISTORY:   Past Medical History:  Diagnosis Date  . Anemia   . Anxiety    takes Klonopin as bedtime  . Asthma   . CHF (congestive heart failure) ([place])   . Constipation    takes Colace as needed  . Depression    takes Citalopram daily  . Dyspnea   . Hypertension    takes Verapamil daily   . Hypothyroidism   . Kidney disease    has frequent UTS's.  just finishing up antibiotics now.  . Neuromuscular disorder ([place])    parkinson's disease    dx 4 yr ago  . Overactive bladder   . Parkinson's disease ([place])    takes Sinemet daily  . Peripheral edema    take Lasix as needed  . Pneumonia   . Seasonal allergies   . Sleep apnea    tested in , 5-6 mths ago.  wears cpap. "sitting on 5"  . Stroke [facility])    "a bunch"   no deficits,  "cleared up".  Last one 4 yr ago  . Tachycardia     PAST SURGICAL HISTORY:   Past Surgical History:  Procedure Laterality Date  . ABDOMINAL HYSTERECTOMY    . CESAREAN SECTION    . fibroid tumor    . MINOR PLACEMENT OF FIDUCIAL N/A 11/22/2016   Procedure: Fiducial placement;  Surgeon: Erline Levine, MD;  Location: [place];  Service: Neurosurgery;  Laterality: N/A;  Fiducial placement  . PULSE GENERATOR IMPLANT Right 12/06/2016   Procedure: Unilateral PULSE GENERATOR IMPLANT;  Surgeon: Erline Levine, MD;  Location: [place];  Service: Neurosurgery;  Laterality: Right;  Unilateral PULSE GENERATOR IMPLANT  . RADIOLOGY WITH ANESTHESIA N/A 09/13/2016   Procedure: MRI BRAIN WITH AND WITHOUT;  Surgeon: Medication Radiologist, MD;  Location: [place];  Service: Radiology;  Laterality: N/A;  . skin cancer removed left arm    . SUBTHALAMIC STIMULATOR INSERTION Bilateral 11/29/2016   Procedure: Bilateral Deep brain stimulator placement;  Surgeon: Erline Levine, MD;  Location: [place];  Service: Neurosurgery;  Laterality: Bilateral;  Bilateral Deep brain stimulator placement  . THYROIDECTOMY, PARTIAL    . TONSILLECTOMY    . TUBAL LIGATION      SOCIAL HISTORY:   Social History   Social History  . Marital status: Widowed    Spouse name: N/A  . Number of children: N/A  . Years of education: N/A   Occupational History  . retired    Social History Main Topics  . Smoking status: Former Smoker    Quit date: 09/27/2012  . Smokeless tobacco: Never Used  . Alcohol  use No  . Drug use: No  . Sexual activity: Not on file   Other Topics Concern  . Not on file   Social History Narrative  . No narrative on file    FAMILY HISTORY:   Family Status  Relation Status  . Mother Deceased   heart disease, DM, complications of fall  . Father Deceased   MI  . Sister Deceased   DM, CAD, renal  failure  . Sister Alive   healthy  . Child Alive   healthy    ROS:  A complete 10 system review of systems was obtained and was unremarkable apart from what is mentioned above.  PHYSICAL EXAMINATION:    VITALS:   Vitals:   01/02/17 1416  BP: 128/72  Pulse: 60  SpO2: 95%  Weight: 167 lb (75.8 kg)  Height: 5\' 5"  (1.651 m)    GEN:  The patient appears stated age and is in NAD. HEENT:  Normocephalic, atraumatic.  The mucous membranes are moist. The superficial temporal arteries are without ropiness or tenderness. CV:  Bradycardic.  Regular. Lungs:  CTAB.  There is dyspnea on exertion. Neck/HEME:  There are no carotid bruits bilaterally. Skin: Incisions over the chest and scalp are well-healed.  Neurological examination:  Orientation: The patient is alert and oriented x3. Fund of knowledge is appropriate.   Cranial nerves: There is good facial symmetry.  There is mild facial hypomimia.   The speech is fluent and dysarthric (before turning on DBS).    No issues with EOM movement.  Soft palate rises symmetrically and there is no tongue deviation. Hearing is intact to conversational tone. Sensation: Sensation is intact to light touch throughout.  Movement examination: Tone: There is no rigidity.   Abnormal movements:  She has mild RUE resting tremor and minimal tremor of the L thumb Coordination: There isdecremation with RAM's, seen with toe taps and heel taps bilaterally, right more than left. She has mild difficulty with rapid pronating movements in the upper extremities, again right greater than left. Gait and Station: The patient has no difficulty  arising out of a deep-seated chair without the use of the hands. The patient's stride length is normal with normal arm swing.   She has an increase in RUE resting tremor.     DBS programming was performed today.  In brief, there was complete resolution of her tremor.  ASSESSMENT/PLAN:  1.  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  -The patient is continuing to experience motor fluctuations including dyskinesia and on/off.    -The patient is status post bilateral STN DBS with the [facility] [employer] device on 11/29/2016 with IPG placed on 12/07/2016.  Her device was activated today, on 01/02/2017.  -Decrease carbidopa/levodopa 25/100, 2 tablets at 7, 1 tablet at 10, 1 tablet at 1pm, 1 tablet at 4pm and then she will decide if she needs the carbidopa/levodopa 50/200 at night.    -d/c comtan  -She is to resume rock steady boxing.    -I asked her to hold on driving until we get her optimally set up with her DBS.  -weaning schedule given for meds pre-op  -continue with home physical therapy and add home speech therapy.   2.  RBD  -Continue klonopin - 0.5 mg - 1/2 po qhs  Risks, benefits, side effects and alternative therapies were discussed.  The opportunity to ask questions was given and they were answered to the best of my ability.  The patient expressed understanding and willingness to follow the outlined treatment protocols.  -hold remeron since that was primarily for sleep (it did work if need to come back to that)  -get bed rails  3.  Vasomotor rhinitis  -Has atropine ophthalmic.  She needs to try and increase to tid.    4.  OSAS and SOB  -just got started with CPAP and may need chin strap.  Told her  to discuss with home company as she is still having difficulty tolerating this.  5.  dizziness.  -Primary care physician is decreasing her verapamil.  6.  Much greater than 50% of this visit was spent in counseling with the patient  and the family.  Total face to face time:  35 min.  This did not include DBS programming time, described on different procedure note

## 2017-01-02 ENCOUNTER — Telehealth: Payer: Self-pay | Admitting: Neurology

## 2017-01-02 ENCOUNTER — Ambulatory Visit (INDEPENDENT_AMBULATORY_CARE_PROVIDER_SITE_OTHER): Payer: Medicare Other | Admitting: Neurology

## 2017-01-02 ENCOUNTER — Encounter: Payer: Self-pay | Admitting: Neurology

## 2017-01-02 VITALS — BP 128/72 | HR 60 | Ht 65.0 in | Wt 167.0 lb

## 2017-01-02 DIAGNOSIS — G20 Parkinson's disease: Secondary | ICD-10-CM

## 2017-01-02 NOTE — Telephone Encounter (Signed)
Left message on machine for patient to call back.

## 2017-01-02 NOTE — Telephone Encounter (Signed)
Spoke with patient's sister and she will make patient aware.

## 2017-01-02 NOTE — Procedures (Signed)
DBS Programming was performed on [employer] Device  Total time spent programming was 40 minutes.  Device was turned on.   Impedences were checked and were within normal limits.  Battery was checked and was determined to be functioning normally and not near the end of life.  Final settings were as follows:  Left brain electrode:     2-C+           ; Amplitude  1.3   mAmp   ; Pulse width 60 microseconds;   Frequency   130   Hz.  Right brain electrode:     11-C+          ; Amplitude   1.3  mAmps ;  Pulse width 60  microseconds;  Frequency   130    Hz.

## 2017-01-02 NOTE — Patient Instructions (Signed)
1.  Stop entacapone (comtan) 2.  Take carbidopa/levodopa 25/100, 2 tablets at 7, 1 tablet at 10, 1 tablet at 1pm, 1 tablet at 4pm and then you decide depending on how you feel if you need the carbidopa/levodopa 50/200 at night.  Let me know how you do with this in a few days 3.  I am going to send an order to [facility] for ST

## 2017-01-02 NOTE — Telephone Encounter (Signed)
-----   Message from [place], DO sent at 12/30/2016  4:32 PM EDT ----- Call patient in the AM to hold levodopa.  Can take AM pill and that is it for the day

## 2017-01-02 NOTE — Telephone Encounter (Signed)
Referral faxed to [facility] at [phone] with confirmation received. To add speech therapy.

## 2017-01-03 ENCOUNTER — Ambulatory Visit: Payer: No Typology Code available for payment source | Admitting: Neurology

## 2017-01-06 ENCOUNTER — Telehealth: Payer: Self-pay | Admitting: Neurology

## 2017-01-06 NOTE — Telephone Encounter (Signed)
Ana Salazar, could you check on this?  We may have lowered meds a little too fast.  Any dyskinesia?

## 2017-01-06 NOTE — Telephone Encounter (Signed)
Tried to call patient and she was not home. Spoke with her sister. When they state she is "shaking" she states that dyskinesia is just worse. She is okay in the morning, but as the day goes on dyskinesia worsens. She does not know if this is bothersome to the patient. Made her aware we probably will not change medications because we don't want to drop her medication too quickly.  Dr. Carles Collet please advise.

## 2017-01-06 NOTE — Telephone Encounter (Signed)
Patients sister made aware.

## 2017-01-06 NOTE — Telephone Encounter (Signed)
If not bothersome to patient, leave alone for right now.  Just means that I will likely need to drop med a little more next time.  She is not turned on a lot yet.

## 2017-01-06 NOTE — Telephone Encounter (Signed)
Caller: Patient's daughter Alyse Low  Urgent? No   Reason for the call: Patient is still having a lot of shaking and has some questions about her medications. The daughter asked could you call [facility]. Thanks

## 2017-01-16 NOTE — Progress Notes (Signed)
u   Ana Salazar was seen today in the movement disorders clinic for neurologic consultation at the request of [name], MD.   The patient presents today for a second opinion regarding Parkinson's disease. She is accompanied by her daughter and sister who supplement the history.   I have reviewed an extensive number of her records from her prior neurologist, [name], and I appreciate those records.  The patient presented to [name] first in November, 2014, but reported that she had had symptoms for about a year and a half prior to that.  Her first symptoms consisted of difficulty moving her feet/shuffling the feet; trouble getting out of the bed; tremor (she thinks that it started in both but does state that the right is worse than the left and always has been).  The patient was started on a trial of levodopa at her first visit with [name] and followed up with him the next month and noted great improvement with the medication.  She was started on pramipexole the following visit but noted that it made her dizzy and it was discontinued over the telephone.  The following visit, she was started on entacapone 200 mg 3 times a day.  In April, 2015 her carbidopa/levodopa 25/100 was increased to 2 tablets 3 times per day.  In July, 2016 she was changed to carbidopa/levodopa 50/200, 3 times per day.  In October, 2016 she was placed on a combination of carbidopa/levodopa 50/200, 3 times a day (4-5am, noon, 8pm) in addition to carbidopa/levodopa 25/100, 2 tablets 3 times per day (1200 mg of levodopa per day).  She remains on the entacapone, 200 mg 3 times per day as well as primidone, 50 mg - 1.5 tablets at night, which she is using for tremor control.  She reports that her biggest frustration is that she is always moving and is more dizzy and is starting to have a few falls.  When she is nearing end of dose (1-2 hours prior) she will have tremor in her stomach, followed by her legs and arms.    10/13/15  update:  The patient is following care today, primarily for levodopa challenge.  She is currently off of medication.  She is accompanied by her daughter (different daughter than last visit) who supplements the history.  She last took her medication at 8pm.  Her medication generally consists of carbidopa/levodopa 50/200 3 times per day in addition to carbidopa/levodopa 25/100, 1 tablet 6 times per day.  Splitting the medication did help somewhat, but she thinks that it is not helping as much as it does initially when she first started doing it that way.  She is also on entacapone 200 mg 3 times per day and primidone 75 mg at night.  She has not had any falls since our last visit.  01/12/16 update:  The patient is following up today, as she called me to ask me about potentially decreasing her levodopa. She is accompanied by her sister who supplements the history.   She is on a very large dosage and somewhat of a strange combination, but came to me on this combination.  She is currently taking carbidopa/levodopa 50/200 3 times a day (5am/noon/8pm) and is now spreading out her carbidopa/levodopa 25/100 to one tablet 6 times per day (5am is the start and she takes that q 3 hrs until 8 pm).  She remains on entacapone 200 mg 3 times a day and primidone 75 mg daily.  She goes to  bed at about 8pm.  She has found that she is having more dyskinesia.  She has been working with rehabilitation therapist and I have gotten a couple of correspondence is from them that they have been concerned about labored breathing.   I advised follow-up with her primary care physician if this was true shortness of breath.  She did say that she wore a holter monitor for a month and that eval was negative.  Was told if continues needs pulm.  Only SOB when she is hot or exerting herself.  He also mentioned that they were concerned about inability to track inferiorly.  She did see [name] for her neuropsych testing, done on 12/22/2015.  She got  feedback from them on 01/05/2016.  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  I specifically spoke with [name], and she did not feel there was evidence of an atypical state from her testing.  She felt that she could potentially be a good DBS candidate.  She did recommend a vision evaluation, medication to help with anxiety/sleep/irritability, and a follow-up regarding a history of sleep apnea.  Pt states that she was already to the eye doctor in December at [facility].    02/02/16 update:  The patient is following up today, earlier than expected.  She is accompanied by her sister who supplements the history.  Last visit I changed her daytime extended release levodopa all over 2 immediate release.  Therefore, she was taking carbidopa/levodopa 25/100 as follows: 2 at 5am (she is waking up to take that pill and goes to bed until 7am/1 at 8am/1 at 11 am//2 at 2 pm/1 at 5pm and then would take an additional levodopa/levodopa 50/200 at 8pm.  This dropped her overall load of levodopa from 1200 mg to 900 mg, primarily because she was complaining about dyskinesia.  She called me on May 22 and stated that she was doing well initially, but now feels more weak and stiff.  When I asked her specifically, she admitted that she was being treated for urinary tract infection.  I told her to give that some time to resolve, because that can worsen the symptoms of Parkinson's.  She called the following day and requested a follow-up appointment here, which is the reason for follow-up.  She states that she is weaker, which means that the legs are "tired."  She denies freezing.  She states that overall she is more tired.  No falls.  She states that the sx's do not wax and wane throughout the day.  Her sister thinks that she is doing better but sister does state that she is "starting to get that shuffle."  Last PT was in mid April.  C/o nose dripping for years.    05/04/16 update:  The patient follows up today,  accompanied by her sister who supplements the history.  Last visit, the patient wanted to decrease her levodopa load, primarily because of dyskinesia.  We ended up reworking it significantly.  I changed her from the CR formulation to the immediate release formulation and did drop the overall load somewhat.  She is currently taking carbidopa/levodopa 25/100, 2 tablets at 7 AM/2 tablet at 10 AM/2 tablets at 1 PM/1 tablet at 4 PM/carbidopa/levodopa 50/200 at bedtime (8PM).  She cut out the 7pm dosage.    She takes entacapone 3 times per day, which turned out to be every other dose of levodopa.  She is still on primidone, but only on 50 mg  daily, which she was on prior to coming to see me.  She initally states that she isn't better but then states that dyskinesia is "a lot better than it was" which was the primary issue for changing.   She has had several falls that she attributes to looking down when she walks or getting dizzy when she first gets up.  Was able to get off of clonidine and losartan per PCP because of dizziness and that helped dizziness.  She hasn't gotten hurt with falls.   The big issue now is trouble sleeping.  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  She states that she was only using one time per day.  She also told me last visit that she was getting ready to have a sleep study for objective sleep apnea syndrome and a pulmonary consult for shortness of breath.  I do not have that information but she tells me that she does have osas and needs cpap titration and is awaiting that.  Denies depression but daughter thinks that she has it.  Pt doesn't have much to do during the day.  06/06/16 update: The patient follows up today, accompanied by her sister and daughter who supplement the history.  She is currently taking carbidopa/levodopa 25/100, 2 tablets at 7 AM/2 tablet at 10 AM/2 tablets at 1 PM/1 tablet at 4  PM/carbidopa/levodopa 50/200 at bedtime (8PM).  She is on entacapone 200 mg 3 times per day.  States that "my legs are really tired." Thinks that this is mostly in middle of the day/early AM and then "I spend the entire day trying to catch up."   Finds that if she takes an extra carbidopa/levodopa 25/100 in the middle of the night she does better.  Last visit, I discontinued her primidone, 50 mg daily.  She was complaining about vasomotor rhinorrhea last visit, and I told her to increase her atropine drops to 3 times a day as needed.  She states that she hasn't do that.  I started remeron last visit for depression and sleep.  Is still on celexa.  Remeron definitely helped sleep but got CPAP since our last visit as well.  Sister states that she is using that and is now screaming and hollaring and hitting the walls at night, but seems this was going on before CPAP.    07/21/16 update:  Pt returns again for levodopa challenge test.  2 of her daughters accompany her and supplement the history.  She is on carbidopa/levodopa 25/100, 2 tablets at 7 AM/2 tablet at 10 AM//2 tablets at 1 PM/1 tablet at 4 PM and then she will decide if she needs her last one tablet at 7 PM.  She also takes carbidopa/levodopa  50/200 at bedtime, which is 8 PM.  She is on comtan 200 mg tid.  She has been off of all PD med for about 60 hrs for this test.  She does feel more slowly and stiff, although she does feel less dizzy getting off of the medication.  10/25/16 update:  Pt returns today for pre-op visit.  She is accompanied by her daughter who supplements the history.  She is scheduled for DBS surgery next month.  She is on carbidopa/levodopa 25/100, 2/2/2/1 and sometimes will take another in middle of the night.  She takes carbidopa/levodopa 50/200 at bedtime.  She takes entacapone with 3 of the daytime dosages of levodopa.  She has had  some falls because not using her walker, often at night per daughter.  She is loving [employer].  Patient seen today in follow-up.  01/02/17 update:  Patient seen today in follow-up.  She is accompanied by her daughters who supplement the history.  She is status post bilateral STN DBS on 11/29/2016.  She had her generator placed on 12/07/2016.  She was in the hospital between the 2 surgeries with complaints of falls.  I had talked to Dr. Vertell Limber.  The patient had refused to go to subacute nursing facility for rehabilitation.  She had apparently also been refusing to use her walker.  We did get home health involved.  She has had a few falls since home health.  She fell on her butt this weekend and "it was the dogs fault."  Few other falls related to letting go of the walker.  Patient does think some of the falls were related to dizziness.  Her primary care physician has just cut back on her verapamil.  Some of issues that were being reported were due to family stress between patient and her sister.  She is now back on carbidopa/levodopa 25/100, 2/2/2/1 and entacapone with 3 of the dosages.  She is on carbidopa/levodopa 50/200 at bedtime.  She last took her PD med at [place] has not been great.  Family having difficulty understanding her.  Hasn't been doing ST with home health.  01/17/17 update:  Patient seen today in follow-up.  I activated her device on 01/02/2017.  She is currently on carbidopa/levodopa 25/100, 2 tablets at 7, 1 tablet at 10, 1 tablet at 1pm, 1 tablet at 4pm and she has d/c the carbidopa/levodopa 50/200 at night.  I discontinued her entacapone last visit.  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  She has been having some dyskinesia mostly with the L leg.  She is not stable and has had some falls in the house.  She has never had a fall with the walker but has had a fall without the walker.  Speech therapy just started last week.     ALLERGIES:   Allergies  Allergen Reactions  . Amoxicillin Anaphylaxis, Swelling and Rash    Has  patient had a PCN reaction causing immediate rash, facial/tongue/throat swelling, SOB or lightheadedness with hypotension: Yes PCN reaction causing SEVERE RASH INVOLVING MUCUS MEMBRANES or SKIN NECROSIS: Yes Has patient had a PCN reaction that required hospitalization: No Has patient had a PCN reaction occurring within the last 10 years: Yes   . Prednisone Shortness Of Breath and Nausea Only    Hypertension   . Amlodipine Itching and Other (See Comments)    BURNING  . Levothyroxine Other (See Comments)    Tired, shaky, tremors  . Meloxicam Swelling    SWELLING REACTION UNSPECIFIED  "Burning "  . Nebivolol Rash  . Nsaids Nausea And Vomiting  . Valsartan Itching    CURRENT MEDICATIONS:  Outpatient Encounter Prescriptions as of 01/17/2017  Medication Sig  . albuterol (PROVENTIL HFA;VENTOLIN HFA) 108 (90 Base) MCG/ACT inhaler Inhale 1 puff into the lungs every 6 (six) hours as needed for wheezing or shortness of breath.  . carbidopa-levodopa (SINEMET CR) 50-200 MG tablet Take 1 tablet by mouth at bedtime.  . carbidopa-levodopa (SINEMET IR) 25-100 MG tablet 2 tablets at 7 AM/2 tablet at 10 AM//2 tablets at 1 PM/1 tablet at 4 PM and one tablet at 7 PM (Patient taking  differently: Take 1-2 tablets by mouth See admin instructions. Pt takes 2 tablets at 0700, 1000, 1300 - pt takes 1 tablet at 1600)  . citalopram (CELEXA) 20 MG tablet Take 10 mg by mouth daily.   . clonazePAM (KLONOPIN) 0.5 MG tablet TAKE 1/2 TABLET BY MOUTH AT BEDTIME  . CRANBERRY PO Take 1 tablet by mouth daily.   Marland Kitchen docusate sodium (COLACE) 100 MG capsule Take 100 mg by mouth daily as needed for mild constipation.   . entacapone (COMTAN) 200 MG tablet TAKE 1 TABLET THREE TIMES PER DAY  . Fluticasone Furoate (ARNUITY ELLIPTA) 100 MCG/ACT AEPB Inhale 1 puff into the lungs daily.   . furosemide (LASIX) 20 MG tablet TAKE 1/2 TABLET DAILY AS NEEDED FOR SIGNIFICANT FLUID SWELLING ONLY  . potassium chloride (K-DUR,KLOR-CON) 10 MEQ  tablet Take 10 mEq by mouth daily at 12 noon.   . verapamil (CALAN) 80 MG tablet Take 80 mg by mouth 3 (three) times daily.   No facility-administered encounter medications on file as of 01/17/2017.     PAST MEDICAL HISTORY:   Past Medical History:  Diagnosis Date  . Anemia   . Anxiety    takes Klonopin as bedtime  . Asthma   . CHF (congestive heart failure) ([place])   . Constipation    takes Colace as needed  . Depression    takes Citalopram daily  . Dyspnea   . Hypertension    takes Verapamil daily  . Hypothyroidism   . Kidney disease    has frequent UTS's.  just finishing up antibiotics now.  . Neuromuscular disorder ([place])    parkinson's disease    dx 4 yr ago  . Overactive bladder   . Parkinson's disease ([place])    takes Sinemet daily  . Peripheral edema    take Lasix as needed  . Pneumonia   . Seasonal allergies   . Sleep apnea    tested in [place], 5-6 mths ago.  wears cpap. "sitting on 5"  . Stroke [facility])    "a bunch"   no deficits,  "cleared up".  Last one 4 yr ago  . Tachycardia     PAST SURGICAL HISTORY:   Past Surgical History:  Procedure Laterality Date  . ABDOMINAL HYSTERECTOMY    . CESAREAN SECTION    . fibroid tumor    . MINOR PLACEMENT OF FIDUCIAL N/A 11/22/2016   Procedure: Fiducial placement;  Surgeon: Erline Levine, MD;  Location: [place];  Service: Neurosurgery;  Laterality: N/A;  Fiducial placement  . PULSE GENERATOR IMPLANT Right 12/06/2016   Procedure: Unilateral PULSE GENERATOR IMPLANT;  Surgeon: Erline Levine, MD;  Location: [place];  Service: Neurosurgery;  Laterality: Right;  Unilateral PULSE GENERATOR IMPLANT  . RADIOLOGY WITH ANESTHESIA N/A 09/13/2016   Procedure: MRI BRAIN WITH AND WITHOUT;  Surgeon: Medication Radiologist, MD;  Location: [place];  Service: Radiology;  Laterality: N/A;  . skin cancer removed left arm    . SUBTHALAMIC STIMULATOR INSERTION Bilateral 11/29/2016   Procedure: Bilateral Deep brain stimulator placement;  Surgeon: Erline Levine,  MD;  Location: [place];  Service: Neurosurgery;  Laterality: Bilateral;  Bilateral Deep brain stimulator placement  . THYROIDECTOMY, PARTIAL    . TONSILLECTOMY    . TUBAL LIGATION      SOCIAL HISTORY:   Social History   Social History  . Marital status: Widowed    Spouse name: N/A  . Number of children: N/A  . Years of education: N/A   Occupational History  .  retired    Social History Main Topics  . Smoking status: Former Smoker    Quit date: 09/27/2012  . Smokeless tobacco: Never Used  . Alcohol use No  . Drug use: No  . Sexual activity: Not on file   Other Topics Concern  . Not on file   Social History Narrative  . No narrative on file    FAMILY HISTORY:   Family Status  Relation Status  . Mother Deceased       heart disease, DM, complications of fall  . Father Deceased       MI  . Sister Deceased       DM, CAD, renal failure  . Sister Alive       healthy  . Child Alive       healthy    ROS:  A complete 10 system review of systems was obtained and was unremarkable apart from what is mentioned above.  PHYSICAL EXAMINATION:    VITALS:   Vitals:   01/17/17 1516  BP: 112/68  Pulse: 62  SpO2: 92%  Weight: 167 lb (75.8 kg)  Height: 5\' 5"  (1.651 m)    GEN:  The patient appears stated age and is in NAD. HEENT:  Normocephalic, atraumatic.  The mucous membranes are moist. The superficial temporal arteries are without ropiness or tenderness. CV:  Bradycardic.  Regular. Lungs:  CTAB.  There is dyspnea on exertion. Neck/HEME:  There are no carotid bruits bilaterally. Skin: Incisions over the chest and scalp are well-healed.  Neurological examination:  Orientation: The patient is alert and oriented x3. Fund of knowledge is appropriate.   Cranial nerves: There is good facial symmetry.  There is mild facial hypomimia.   The speech is fluent and very minimally dysarthric.   No issues with EOM movement.  Soft palate rises symmetrically and there is no tongue  deviation. Hearing is intact to conversational tone. Sensation: Sensation is intact to light touch throughout.  Movement examination: Tone: There is no rigidity.   Abnormal movements:  She has no tremor.  She has mod to severe dyskinesia in the LLE.   Coordination: There isno decremation today Gait and Station: The patient has no difficulty arising out of a deep-seated chair without the use of the hands. The patient's stride length is normal with slight dyskinesia in the LUE.  She is just mildly unstable because of a dyskinetic left leg  DBS programming was performed today.  Did not change program today.  ASSESSMENT/PLAN:  1.  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  -The patient is continuing to experience motor fluctuations including dyskinesia and on/off.    -The patient is status post bilateral STN DBS with the [facility] [employer] device on 11/29/2016 with IPG placed on 12/07/2016.  Her device was activated today, on 01/02/2017.  -Decrease carbidopa/levodopa 25/100, 1 tablets at 7am, 0.5 tablets at 11am, 3pm, 7pm   -she can resume driving, non interstate  -She is to resume rock steady boxing.    -continue ST  -use walker at all times.  Doesn't fall when has walker   2.  RBD  -Continue klonopin - 0.5 mg - 1/2 po qhs  Risks, benefits, side effects and alternative therapies were discussed.  The opportunity to ask questions was given and they were answered to the best of my ability.  The patient expressed understanding and willingness to follow the outlined treatment protocols.  -hold remeron since that was primarily for  sleep (it did work if need to come back to that)  -get bed rails  3.  Vasomotor rhinitis  -Has atropine ophthalmic.  She needs to try and increase to tid.    4.  OSAS and SOB  -just got started with CPAP and may need chin strap.  Told her to discuss with home company as she is still having difficulty  tolerating this.  5.  dizziness.  -Primary care physician is decreasing her verapamil.  States she is still noting low BP and asked her to follow back up with PCP to discuss lowering verapamil more.    6.  Much greater than 50% of this visit was spent in counseling with the patient and the family.  Total face to face time:  40 min.

## 2017-01-17 ENCOUNTER — Ambulatory Visit (INDEPENDENT_AMBULATORY_CARE_PROVIDER_SITE_OTHER): Payer: Medicare Other | Admitting: Neurology

## 2017-01-17 ENCOUNTER — Encounter: Payer: Self-pay | Admitting: Neurology

## 2017-01-17 VITALS — BP 112/68 | HR 62 | Ht 65.0 in | Wt 167.0 lb

## 2017-01-17 DIAGNOSIS — G20 Parkinson's disease: Secondary | ICD-10-CM

## 2017-01-17 DIAGNOSIS — G249 Dystonia, unspecified: Secondary | ICD-10-CM

## 2017-01-17 DIAGNOSIS — R42 Dizziness and giddiness: Secondary | ICD-10-CM | POA: Diagnosis not present

## 2017-01-17 DIAGNOSIS — G20A1 Parkinson's disease without dyskinesia, without mention of fluctuations: Secondary | ICD-10-CM

## 2017-01-17 DIAGNOSIS — G20B1 Parkinson's disease with dyskinesia, without mention of fluctuations: Secondary | ICD-10-CM

## 2017-01-17 DIAGNOSIS — G4752 REM sleep behavior disorder: Secondary | ICD-10-CM

## 2017-01-17 NOTE — Procedures (Signed)
DBS Programming was performed on [employer] Device  Total time spent programming was 25 minutes.  Device was turned on.   Impedences were checked and were within normal limits.  Battery was checked and was determined to be functioning normally and not near the end of life.  However, it was apparent that patient not fully charging up battery and told/shown how to properly do this Final settings were as follows:  Left brain electrode:     2-C+           ; Amplitude  1.3   mAmp   ; Pulse width 60 microseconds;   Frequency   130   Hz.  Right brain electrode:     11-C+          ; Amplitude   1.3  mAmps ;  Pulse width 60  microseconds;  Frequency   130    Hz.

## 2017-01-17 NOTE — Patient Instructions (Signed)
1. Take Levodopa as follows:  1 tablet at 7 am 1/2 tablet at 11 am 1/2 tablet at 3 pm 1/2 tablet at 7 pm  2. You are released to driving, but no interstate.

## 2017-01-24 ENCOUNTER — Telehealth: Payer: Self-pay | Admitting: Neurology

## 2017-01-24 NOTE — Telephone Encounter (Signed)
Caller: Rod Holler (sister)   Urgent? Yes  Reason for the call: She has recently had the DBS surgery and her sister said that the medication that she is on she is not doing well with. She asked to speak with you. Thanks

## 2017-01-24 NOTE — Telephone Encounter (Signed)
They have a seat built into their shower. The OT had put a non slip cover on it so she wouldn't slip while sitting. She will let us know if she wants a lift chair. Patient is much better with dyskinesia.

## 2017-01-24 NOTE — Telephone Encounter (Signed)
Spoke with patient's sister. She states patient has been doing really well. Today she states after she got a shower her "knees buckled" and she couldn't get her to move. The nurse came (from home health care) and her blood pressure was a little high. She took her medication and was able to move to a seat.  Her sister states she "can't continue" to help her when she is like this. States she is alone with her all day. She asked about a rehab facility to do physical therapy.   Patient has been released from home health physical therapy because she was doing well. I let her know that if she has been doing well otherwise and only had this one episode lately that you wouldn't adjust medications based on this.   I let them know that there needs to be a family discussion about placement and that wouldn't be up to Dr. Carles Collet.   Dr. Carles Collet Juluis Rainier.

## 2017-01-24 NOTE — Telephone Encounter (Signed)
Sounds like they need to get a shower chair.  Does home also have a lift chair?  Is dyskinesia (the excess movement) she was having better?  Does she now know her appt is 6/1

## 2017-02-01 ENCOUNTER — Telehealth: Payer: Self-pay | Admitting: Neurology

## 2017-02-01 NOTE — Telephone Encounter (Signed)
Caller: PT  Urgent?   Reason for the call: PT called and said she is not doing well and she keeps falling, she wants something done with her medication

## 2017-02-01 NOTE — Telephone Encounter (Signed)
Spoke with patient. She has an appt on Friday already. I let her know to keep appt to discuss medications. I did offer to move appt to this evening but she is not able.

## 2017-02-01 NOTE — Progress Notes (Signed)
u   Ana Salazar was seen today in the movement disorders clinic for neurologic consultation at the request of Ana File, MD.   The patient presents today for a second opinion regarding Parkinson's disease. She is accompanied by her daughter and sister who supplement the history.   I have reviewed an extensive number of her records from her prior neurologist, Dr. Blenda Salazar, and I appreciate those records.  The patient presented to Dr. Blenda Salazar first in November, 2014, but reported that she had had symptoms for about a year and a half prior to that.  Her first symptoms consisted of difficulty moving her feet/shuffling the feet; trouble getting out of the bed; tremor (she thinks that it started in both but does state that the right is worse than the left and always has been).  The patient was started on a trial of levodopa at her first visit with Dr. Blenda Salazar and followed up with him the next month and noted great improvement with the medication.  She was started on pramipexole the following visit but noted that it made her dizzy and it was discontinued over the telephone.  The following visit, she was started on entacapone 200 mg 3 times a day.  In April, 2015 her carbidopa/levodopa 25/100 was increased to 2 tablets 3 times per day.  In July, 2016 she was changed to carbidopa/levodopa 50/200, 3 times per day.  In October, 2016 she was placed on a combination of carbidopa/levodopa 50/200, 3 times a day (4-5am, noon, 8pm) in addition to carbidopa/levodopa 25/100, 2 tablets 3 times per day (1200 mg of levodopa per day).  She remains on the entacapone, 200 mg 3 times per day as well as primidone, 50 mg - 1.5 tablets at night, which she is using for tremor control.  She reports that her biggest frustration is that she is always moving and is more dizzy and is starting to have a few falls.  When she is nearing end of dose (1-2 hours prior) she will have tremor in her stomach, followed by her legs and arms.    10/13/15  update:  The patient is following care today, primarily for levodopa challenge.  She is currently off of medication.  She is accompanied by her daughter (different daughter than last visit) who supplements the history.  She last took her medication at 8pm.  Her medication generally consists of carbidopa/levodopa 50/200 3 times per day in addition to carbidopa/levodopa 25/100, 1 tablet 6 times per day.  Splitting the medication did help somewhat, but she thinks that it is not helping as much as it does initially when she first started doing it that way.  She is also on entacapone 200 mg 3 times per day and primidone 75 mg at night.  She has not had any falls since our last visit.  01/12/16 update:  The patient is following up today, as she called me to ask me about potentially decreasing her levodopa. She is accompanied by her sister who supplements the history.   She is on a very large dosage and somewhat of a strange combination, but came to me on this combination.  She is currently taking carbidopa/levodopa 50/200 3 times a day (5am/noon/8pm) and is now spreading out her carbidopa/levodopa 25/100 to one tablet 6 times per day (5am is the start and she takes that q 3 hrs until 8 pm).  She remains on entacapone 200 mg 3 times a day and primidone 75 mg daily.  She goes to  bed at about 8pm.  She has found that she is having more dyskinesia.  She has been working with rehabilitation therapist and I have gotten a couple of correspondence is from them that they have been concerned about labored breathing.   I advised follow-up with her primary care physician if this was true shortness of breath.  She did say that she wore a holter monitor for a month and that eval was negative.  Was told if continues needs pulm.  Only SOB when she is hot or exerting herself.  He also mentioned that they were concerned about inability to track inferiorly.  She did see Dr. Leonides Salazar for her neuropsych testing, done on 12/22/2015.  She got  feedback from them on 01/05/2016.  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  I specifically spoke with Dr. Leonides Salazar, and she did not feel there was evidence of an atypical state from her testing.  She felt that she could potentially be a good DBS candidate.  She did recommend a vision evaluation, medication to help with anxiety/sleep/irritability, and a follow-up regarding a history of sleep apnea.  Pt states that she was already to the eye doctor in December at [facility].    02/02/16 update:  The patient is following up today, earlier than expected.  She is accompanied by her sister who supplements the history.  Last visit I changed her daytime extended release levodopa all over 2 immediate release.  Therefore, she was taking carbidopa/levodopa 25/100 as follows: 2 at 5am (she is waking up to take that pill and goes to bed until 7am/1 at 8am/1 at 11 am//2 at 2 pm/1 at 5pm and then would take an additional levodopa/levodopa 50/200 at 8pm.  This dropped her overall load of levodopa from 1200 mg to 900 mg, primarily because she was complaining about dyskinesia.  She called me on May 22 and stated that she was doing well initially, but now feels more weak and stiff.  When I asked her specifically, she admitted that she was being treated for urinary tract infection.  I told her to give that some time to resolve, because that can worsen the symptoms of Parkinson's.  She called the following day and requested a follow-up appointment here, which is the reason for follow-up.  She states that she is weaker, which means that the legs are "tired."  She denies freezing.  She states that overall she is more tired.  No falls.  She states that the sx's do not wax and wane throughout the day.  Her sister thinks that she is doing better but sister does state that she is "starting to get that shuffle."  Last PT was in mid April.  C/o nose dripping for years.    05/04/16 update:  The patient follows up today,  accompanied by her sister who supplements the history.  Last visit, the patient wanted to decrease her levodopa load, primarily because of dyskinesia.  We ended up reworking it significantly.  I changed her from the CR formulation to the immediate release formulation and did drop the overall load somewhat.  She is currently taking carbidopa/levodopa 25/100, 2 tablets at 7 AM/2 tablet at 10 AM/2 tablets at 1 PM/1 tablet at 4 PM/carbidopa/levodopa 50/200 at bedtime (8PM).  She cut out the 7pm dosage.    She takes entacapone 3 times per day, which turned out to be every other dose of levodopa.  She is still on primidone, but only on 50 mg  daily, which she was on prior to coming to see me.  She initally states that she isn't better but then states that dyskinesia is "a lot better than it was" which was the primary issue for changing.   She has had several falls that she attributes to looking down when she walks or getting dizzy when she first gets up.  Was able to get off of clonidine and losartan per PCP because of dizziness and that helped dizziness.  She hasn't gotten hurt with falls.   The big issue now is trouble sleeping.  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  She states that she was only using one time per day.  She also told me last visit that she was getting ready to have a sleep study for objective sleep apnea syndrome and a pulmonary consult for shortness of breath.  I do not have that information but she tells me that she does have osas and needs cpap titration and is awaiting that.  Denies depression but daughter thinks that she has it.  Pt doesn't have much to do during the day.  06/06/16 update: The patient follows up today, accompanied by her sister and daughter who supplement the history.  She is currently taking carbidopa/levodopa 25/100, 2 tablets at 7 AM/2 tablet at 10 AM/2 tablets at 1 PM/1 tablet at 4  PM/carbidopa/levodopa 50/200 at bedtime (8PM).  She is on entacapone 200 mg 3 times per day.  States that "my legs are really tired." Thinks that this is mostly in middle of the day/early AM and then "I spend the entire day trying to catch up."   Finds that if she takes an extra carbidopa/levodopa 25/100 in the middle of the night she does better.  Last visit, I discontinued her primidone, 50 mg daily.  She was complaining about vasomotor rhinorrhea last visit, and I told her to increase her atropine drops to 3 times a day as needed.  She states that she hasn't do that.  I started remeron last visit for depression and sleep.  Is still on celexa.  Remeron definitely helped sleep but got CPAP since our last visit as well.  Sister states that she is using that and is now screaming and hollaring and hitting the walls at night, but seems this was going on before CPAP.    07/21/16 update:  Pt returns again for levodopa challenge test.  2 of her daughters accompany her and supplement the history.  She is on carbidopa/levodopa 25/100, 2 tablets at 7 AM/2 tablet at 10 AM//2 tablets at 1 PM/1 tablet at 4 PM and then she will decide if she needs her last one tablet at 7 PM.  She also takes carbidopa/levodopa  50/200 at bedtime, which is 8 PM.  She is on comtan 200 mg tid.  She has been off of all PD med for about 60 hrs for this test.  She does feel more slowly and stiff, although she does feel less dizzy getting off of the medication.  10/25/16 update:  Pt returns today for pre-op visit.  She is accompanied by her daughter who supplements the history.  She is scheduled for DBS surgery next month.  She is on carbidopa/levodopa 25/100, 2/2/2/1 and sometimes will take another in middle of the night.  She takes carbidopa/levodopa 50/200 at bedtime.  She takes entacapone with 3 of the daytime dosages of levodopa.  She has had  some falls because not using her walker, often at night per daughter.  She is loving [employer].  Patient seen today in follow-up.  01/02/17 update:  Patient seen today in follow-up.  She is accompanied by her daughters who supplement the history.  She is status post bilateral STN DBS on 11/29/2016.  She had her generator placed on 12/07/2016.  She was in the hospital between the 2 surgeries with complaints of falls.  I had talked to Dr. Vertell Limber.  The patient had refused to go to subacute nursing facility for rehabilitation.  She had apparently also been refusing to use her walker.  We did get home health involved.  She has had a few falls since home health.  She fell on her butt this weekend and "it was the dogs fault."  Few other falls related to letting go of the walker.  Patient does think some of the falls were related to dizziness.  Her primary care physician has just cut back on her verapamil.  Some of issues that were being reported were due to family stress between patient and her sister.  She is now back on carbidopa/levodopa 25/100, 2/2/2/1 and entacapone with 3 of the dosages.  She is on carbidopa/levodopa 50/200 at bedtime.  She last took her PD med at [place] has not been great.  Family having difficulty understanding her.  Hasn't been doing ST with home health.  01/17/17 update:  Patient seen today in follow-up.  I activated her device on 01/02/2017.  She is currently on carbidopa/levodopa 25/100, 2 tablets at 7, 1 tablet at 10, 1 tablet at 1pm, 1 tablet at 4pm and she has d/c the carbidopa/levodopa 50/200 at night.  I discontinued her entacapone last visit.  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  She has been having some dyskinesia mostly with the L leg.  She is not stable and has had some falls in the house.  She has never had a fall with the walker but has had a fall without the walker.  Speech therapy just started last week.    02/03/17 update:  Patient seen today in follow-up.  Last visit, I decreased her medication and told her to  take carbidopa/levodopa 25/100, 1 tablets at 7am, 0.5 tablets at 11am, 3pm, 7pm.  While dyskinesia is better, she has had multiple falls.  With all of the falls, she has let go of the walker for some reason (getting out of shower, making a sandwich).  "I'm taking the walker more than I did."  She has never fallen when actually holding on to the walker.  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   ALLERGIES:   Allergies  Allergen Reactions  . Amoxicillin Anaphylaxis, Swelling and Rash    Has patient had a PCN reaction causing immediate rash, facial/tongue/throat swelling, SOB or lightheadedness with hypotension: Yes PCN reaction causing SEVERE RASH INVOLVING MUCUS MEMBRANES or SKIN NECROSIS: Yes Has patient had a PCN reaction that required hospitalization: No Has patient had a PCN reaction occurring within the last 10 years: Yes   . Prednisone Shortness Of Breath and Nausea Only    Hypertension   . Amlodipine Itching and Other (See Comments)    BURNING  . Levothyroxine Other (See Comments)    Tired, shaky, tremors  . Meloxicam Swelling    SWELLING REACTION UNSPECIFIED  "Burning "  . Nebivolol  Rash  . Nsaids Nausea And Vomiting  . Valsartan Itching    CURRENT MEDICATIONS:  Outpatient Encounter Prescriptions as of 02/03/2017  Medication Sig  . albuterol (PROVENTIL HFA;VENTOLIN HFA) 108 (90 Base) MCG/ACT inhaler Inhale 1 puff into the lungs every 6 (six) hours as needed for wheezing or shortness of breath.  . carbidopa-levodopa (SINEMET CR) 50-200 MG tablet Take 1 tablet by mouth at bedtime.  . carbidopa-levodopa (SINEMET IR) 25-100 MG tablet 2 tablets at 7 AM/2 tablet at 10 AM//2 tablets at 1 PM/1 tablet at 4 PM and one tablet at 7 PM (Patient taking differently: Take 1-2 tablets by mouth See admin instructions. Pt takes 2 tablets at 0700, 1000, 1300 - pt takes 1 tablet at 1600)  . citalopram (CELEXA) 20 MG tablet Take 10 mg by mouth daily.   .  clonazePAM (KLONOPIN) 0.5 MG tablet TAKE 1/2 TABLET BY MOUTH AT BEDTIME  . CRANBERRY PO Take 1 tablet by mouth daily.   Marland Kitchen docusate sodium (COLACE) 100 MG capsule Take 100 mg by mouth daily as needed for mild constipation.   . entacapone (COMTAN) 200 MG tablet TAKE 1 TABLET THREE TIMES PER DAY  . Fluticasone Furoate (ARNUITY ELLIPTA) 100 MCG/ACT AEPB Inhale 1 puff into the lungs daily.   . furosemide (LASIX) 20 MG tablet TAKE 1/2 TABLET DAILY AS NEEDED FOR SIGNIFICANT FLUID SWELLING ONLY  . potassium chloride (K-DUR,KLOR-CON) 10 MEQ tablet Take 10 mEq by mouth daily at 12 noon.   . verapamil (CALAN) 80 MG tablet Take 80 mg by mouth 2 (two) times daily.    No facility-administered encounter medications on Salazar as of 02/03/2017.     PAST MEDICAL HISTORY:   Past Medical History:  Diagnosis Date  . Anemia   . Anxiety    takes Klonopin as bedtime  . Asthma   . CHF (congestive heart failure) ([place])   . Constipation    takes Colace as needed  . Depression    takes Citalopram daily  . Dyspnea   . Hypertension    takes Verapamil daily  . Hypothyroidism   . Kidney disease    has frequent UTS's.  just finishing up antibiotics now.  . Neuromuscular disorder ([place])    parkinson's disease    dx 4 yr ago  . Overactive bladder   . Parkinson's disease ([place])    takes Sinemet daily  . Peripheral edema    take Lasix as needed  . Pneumonia   . Seasonal allergies   . Sleep apnea    tested in [place], 5-6 mths ago.  wears cpap. "sitting on 5"  . Stroke [facility])    "a bunch"   no deficits,  "cleared up".  Last one 4 yr ago  . Tachycardia     PAST SURGICAL HISTORY:   Past Surgical History:  Procedure Laterality Date  . ABDOMINAL HYSTERECTOMY    . CESAREAN SECTION    . fibroid tumor    . MINOR PLACEMENT OF FIDUCIAL N/A 11/22/2016   Procedure: Fiducial placement;  Surgeon: Erline Levine, MD;  Location: [facility];  Service: Neurosurgery;  Laterality: N/A;  Fiducial placement  . PULSE GENERATOR IMPLANT  Right 12/06/2016   Procedure: Unilateral PULSE GENERATOR IMPLANT;  Surgeon: Erline Levine, MD;  Location: [facility];  Service: Neurosurgery;  Laterality: Right;  Unilateral PULSE GENERATOR IMPLANT  . RADIOLOGY WITH ANESTHESIA N/A 09/13/2016   Procedure: MRI BRAIN WITH AND WITHOUT;  Surgeon: Medication Radiologist, MD;  Location: [place];  Service: Radiology;  Laterality: N/A;  . skin cancer removed left arm    . SUBTHALAMIC STIMULATOR INSERTION Bilateral 11/29/2016   Procedure: Bilateral Deep brain stimulator placement;  Surgeon: Erline Levine, MD;  Location: [place];  Service: Neurosurgery;  Laterality: Bilateral;  Bilateral Deep brain stimulator placement  . THYROIDECTOMY, PARTIAL    . TONSILLECTOMY    . TUBAL LIGATION      SOCIAL HISTORY:   Social History   Social History  . Marital status: Widowed    Spouse name: N/A  . Number of children: N/A  . Years of education: N/A   Occupational History  . retired    Social History Main Topics  . Smoking status: Former Smoker    Quit date: 09/27/2012  . Smokeless tobacco: Never Used  . Alcohol use No  . Drug use: No  . Sexual activity: Not on Salazar   Other Topics Concern  . Not on Salazar   Social History Narrative  . No narrative on Salazar    FAMILY HISTORY:   Family Status  Relation Status  . Mother Deceased       heart disease, DM, complications of fall  . Father Deceased       MI  . Sister Deceased       DM, CAD, renal failure  . Sister Alive       healthy  . Child Alive       healthy    ROS:  A complete 10 system review of systems was obtained and was unremarkable apart from what is mentioned above.  PHYSICAL EXAMINATION:    VITALS:   Vitals:   02/03/17 1332  BP: 104/72  Pulse: 63  SpO2: 94%  Weight: 166 lb (75.3 kg)  Height: 5\' 5"  (1.651 m)    GEN:  The patient appears stated age and is in NAD. HEENT:  Normocephalic, atraumatic.  The mucous membranes are moist. The superficial temporal arteries are without ropiness or  tenderness. CV:  Bradycardic.  Regular. Lungs:  CTAB.  There is dyspnea on exertion. Neck/HEME:  There are no carotid bruits bilaterally. Skin: Incisions over the chest and scalp are well-healed.  Neurological examination:  Orientation: The patient is alert and oriented x3. Fund of knowledge is appropriate.   Cranial nerves: There is good facial symmetry.  There is mild facial hypomimia.   The speech is fluent and very minimally dysarthric.   No issues with EOM movement.  Soft palate rises symmetrically and there is no tongue deviation. Hearing is intact to conversational tone. Sensation: Sensation is intact to light touch throughout.  Movement examination: Tone: There is no rigidity.   Abnormal movements:  She has RUE tremor.   She has mild to mod dyskinesia of the L foot Coordination: There isno decremation today Gait and Station: The patient has no difficulty arising out of a deep-seated chair without the use of the hands. The patient's stride length is normal with slight dyskinesia in the LUE.  She is just mildly unstable because of a dyskinetic left leg  DBS programming was performed today.  Did not change program today.  ASSESSMENT/PLAN:  1.  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  -The patient is continuing to experience motor fluctuations including dyskinesia and on/off.    -The patient is status post bilateral STN DBS with the [facility] [employer] device on 11/29/2016 with IPG placed on 12/07/2016.  Her device was activated on 01/02/2017.  -Decrease carbidopa/levodopa 25/100, 1 tablets  at 7am, 0.5 tablets at 11am, 3pm, 7pm   -she can resume driving, non interstate  -She wants to go to SNF for rehab but per social worker she likely won't qualify due to lack of 3 day hosp stay  -continue ST  -use walker at all times.  Doesn't fall when has walker   2.  RBD  -Continue klonopin - 0.5 mg - 1/2 po qhs  Risks, benefits,  side effects and alternative therapies were discussed.  The opportunity to ask questions was given and they were answered to the best of my ability.  The patient expressed understanding and willingness to follow the outlined treatment protocols.  -hold remeron since that was primarily for sleep (it did work if need to come back to that)  -get bed rails  3.  Vasomotor rhinitis  -Has atropine ophthalmic.  She needs to try and increase to tid.    4.  OSAS and SOB  -just got started with CPAP and may need chin strap.  Told her to discuss with home company as she is still having difficulty tolerating this.  5.  Dizziness/Orthostatic hypotension  -Primary care physician is decreasing her verapamil.  States she is still noting low BP but thinks that it is being lowered too slow.  Will make appt with her cardiologist, Dr. Wynonia Lawman to get opinion.    6.  Much greater than 50% of this visit was spent in counseling with the patient and the family.  Total face to face time:  20 min.

## 2017-02-03 ENCOUNTER — Ambulatory Visit (INDEPENDENT_AMBULATORY_CARE_PROVIDER_SITE_OTHER): Payer: Medicare Other | Admitting: Neurology

## 2017-02-03 ENCOUNTER — Encounter: Payer: Self-pay | Admitting: Neurology

## 2017-02-03 VITALS — BP 104/72 | HR 63 | Ht 65.0 in | Wt 166.0 lb

## 2017-02-03 DIAGNOSIS — G4752 REM sleep behavior disorder: Secondary | ICD-10-CM

## 2017-02-03 DIAGNOSIS — G249 Dystonia, unspecified: Secondary | ICD-10-CM

## 2017-02-03 DIAGNOSIS — G20 Parkinson's disease: Secondary | ICD-10-CM

## 2017-02-03 MED ORDER — ABDOMINAL BINDER/ELASTIC MED MISC: 1.0000 | Freq: Every day | 0 refills | Status: DC

## 2017-02-03 NOTE — Patient Instructions (Signed)
1. Appt made with Dr. Wynonia Lawman for 03/02/17 at 3:45 pm.

## 2017-02-03 NOTE — Procedures (Signed)
DBS Programming was performed on [employer] Device  Total time spent programming was 25 minutes.  Device was turned on.   Impedences were checked and were within normal limits.  Battery was checked and was determined to be functioning normally and not near the end of life.   Final settings were as follows:  Left brain electrode:     2-C+           ; Amplitude  1.6   mAmp   ; Pulse width 60 microseconds;   Frequency   130   Hz.  Right brain electrode:     11-C+          ; Amplitude   1.5  mAmps ;  Pulse width 60  microseconds;  Frequency   130    Hz.

## 2017-02-06 ENCOUNTER — Telehealth: Payer: Self-pay | Admitting: Neurology

## 2017-02-06 NOTE — Telephone Encounter (Signed)
Left message giving verbal order [facility] for social worker consult.

## 2017-02-06 NOTE — Telephone Encounter (Signed)
Ana Salazar at [employer] [phone] needs a verbal order for a social worker consult visit this week. Patient last week had to cancel hers

## 2017-02-07 ENCOUNTER — Telehealth: Payer: Self-pay | Admitting: Neurology

## 2017-02-07 ENCOUNTER — Ambulatory Visit: Payer: No Typology Code available for payment source | Admitting: Neurology

## 2017-02-09 ENCOUNTER — Telehealth: Payer: Self-pay | Admitting: Neurology

## 2017-02-09 NOTE — Telephone Encounter (Signed)
Call Caesar Bookman at [employer] or have [employer] him, since I am on vacation and out of the office

## 2017-02-09 NOTE — Telephone Encounter (Signed)
PT called and the DBS machine has stopped working, thinks it's the battery, would like a call back

## 2017-02-09 NOTE — Telephone Encounter (Signed)
Will you call this man please?

## 2017-02-10 NOTE — Telephone Encounter (Signed)
Ana Salazar has reached out to the patient.  It seems there was a little confusion on which icon to look at on her remote.  He has walked her through everything,  Her device is fully charged according to the remote.  He will call her back to make sure all is working later this morning.

## 2017-02-10 NOTE — Telephone Encounter (Signed)
Noted  

## 2017-02-14 ENCOUNTER — Telehealth: Payer: Self-pay | Admitting: Neurology

## 2017-02-14 NOTE — Telephone Encounter (Signed)
Left message on machine for patient's daughter to call back.   

## 2017-02-14 NOTE — Telephone Encounter (Signed)
I decreased that medication 2 visits ago not last visit.  We can try to go back up a bit but she was having lots of dyskinesia before.  Go to 1 in the AM, then 0.5/1/0.5 and see if that is enough.

## 2017-02-14 NOTE — Telephone Encounter (Signed)
Spoke with patient's daughter.  She states since the patient's last visit she has been slowly getting worse.  Carbidopa was decreased to 1 tablet in the morning, 1/2 tablet three times daily.  She states patient has been moving slower and voice is much more quiet.  Slow movements are lasting all day, but leg weakness that has been going on is worse in the morning and at night.  Her dyskinesias are gone.  They want to know if they can slightly increase medication.  She is going into the intensive rehab program at the beginning of July.

## 2017-02-14 NOTE — Telephone Encounter (Signed)
PT's daughter Alyse Low called in regards to PT, and if her medication can be bumped up that she takes for Parkinsons but did not know the name of it

## 2017-02-15 NOTE — Telephone Encounter (Signed)
Patients daughter made aware

## 2017-02-15 NOTE — Telephone Encounter (Signed)
Left message on machine for patient to call back.

## 2017-04-06 ENCOUNTER — Telehealth: Payer: Self-pay | Admitting: Neurology

## 2017-04-06 NOTE — Telephone Encounter (Signed)
Spoke with Tiffany and she states patient's tremors started worsening about three weeks ago.  No other increase in symptoms. Tried to verify what medications she is currently on/how she is taking them. She states she will fax me a list. Awaiting receipt.

## 2017-04-06 NOTE — Telephone Encounter (Signed)
Received med list from [facility].  Patient taking Carbidopa Levodopa 25/100 IR - 1 tablet at 7 am, 1/2 tablet at 11 am, 1 tablet at 3 pm, 1/2 tablet at 7 pm.  (last office note states she should be taking carbidopa/levodopa 25/100, 1 tablets at 7am, 0.5 tablets at 11am, 3pm, 7pm)  Next appt is 10 am.   Please advise.

## 2017-04-06 NOTE — Telephone Encounter (Signed)
Tiffany with [facility] called and said PT's tremors are getting worse and wants to know if Dr Tat can adjust her medication CB# [phone] [id number]

## 2017-04-17 ENCOUNTER — Other Ambulatory Visit: Payer: Self-pay

## 2017-04-17 ENCOUNTER — Encounter (HOSPITAL_COMMUNITY): Payer: Self-pay | Admitting: Internal Medicine

## 2017-04-17 ENCOUNTER — Inpatient Hospital Stay (HOSPITAL_COMMUNITY)
Admission: AD | Admit: 2017-04-17 | Discharge: 2017-04-19 | DRG: 281 | Disposition: A | Discharge status: Home / Self Care | Payer: Medicare (Managed Care) | Source: Other Acute Inpatient Hospital | Attending: Family Medicine | Admitting: Family Medicine

## 2017-04-17 DIAGNOSIS — I471 Supraventricular tachycardia, unspecified: Secondary | ICD-10-CM | POA: Diagnosis present

## 2017-04-17 DIAGNOSIS — I25119 Atherosclerotic heart disease of native coronary artery with unspecified angina pectoris: Secondary | ICD-10-CM | POA: Diagnosis present

## 2017-04-17 DIAGNOSIS — J449 Chronic obstructive pulmonary disease, unspecified: Secondary | ICD-10-CM | POA: Diagnosis present

## 2017-04-17 DIAGNOSIS — I10 Essential (primary) hypertension: Secondary | ICD-10-CM | POA: Diagnosis present

## 2017-04-17 DIAGNOSIS — Z8249 Family history of ischemic heart disease and other diseases of the circulatory system: Secondary | ICD-10-CM | POA: Diagnosis not present

## 2017-04-17 DIAGNOSIS — G20 Parkinson's disease: Secondary | ICD-10-CM | POA: Diagnosis present

## 2017-04-17 DIAGNOSIS — Z85828 Personal history of other malignant neoplasm of skin: Secondary | ICD-10-CM

## 2017-04-17 DIAGNOSIS — R000 Tachycardia, unspecified: Secondary | ICD-10-CM

## 2017-04-17 DIAGNOSIS — R296 Repeated falls: Secondary | ICD-10-CM | POA: Diagnosis present

## 2017-04-17 DIAGNOSIS — I119 Hypertensive heart disease without heart failure: Secondary | ICD-10-CM | POA: Diagnosis present

## 2017-04-17 DIAGNOSIS — Z8673 Personal history of transient ischemic attack (TIA), and cerebral infarction without residual deficits: Secondary | ICD-10-CM | POA: Diagnosis not present

## 2017-04-17 DIAGNOSIS — I7389 Other specified peripheral vascular diseases: Secondary | ICD-10-CM | POA: Diagnosis present

## 2017-04-17 DIAGNOSIS — I5032 Chronic diastolic (congestive) heart failure: Secondary | ICD-10-CM | POA: Diagnosis not present

## 2017-04-17 DIAGNOSIS — J4520 Mild intermittent asthma, uncomplicated: Secondary | ICD-10-CM

## 2017-04-17 DIAGNOSIS — I214 Non-ST elevation (NSTEMI) myocardial infarction: Principal | ICD-10-CM | POA: Diagnosis present

## 2017-04-17 DIAGNOSIS — I4891 Unspecified atrial fibrillation: Secondary | ICD-10-CM | POA: Diagnosis present

## 2017-04-17 DIAGNOSIS — I350 Nonrheumatic aortic (valve) stenosis: Secondary | ICD-10-CM | POA: Diagnosis present

## 2017-04-17 DIAGNOSIS — G20A1 Parkinson's disease without dyskinesia, without mention of fluctuations: Secondary | ICD-10-CM | POA: Diagnosis present

## 2017-04-17 DIAGNOSIS — Z833 Family history of diabetes mellitus: Secondary | ICD-10-CM | POA: Diagnosis not present

## 2017-04-17 DIAGNOSIS — I429 Cardiomyopathy, unspecified: Secondary | ICD-10-CM | POA: Diagnosis present

## 2017-04-17 DIAGNOSIS — Z87891 Personal history of nicotine dependence: Secondary | ICD-10-CM | POA: Diagnosis not present

## 2017-04-17 DIAGNOSIS — E039 Hypothyroidism, unspecified: Secondary | ICD-10-CM | POA: Diagnosis present

## 2017-04-17 DIAGNOSIS — F329 Major depressive disorder, single episode, unspecified: Secondary | ICD-10-CM | POA: Diagnosis present

## 2017-04-17 DIAGNOSIS — I2510 Atherosclerotic heart disease of native coronary artery without angina pectoris: Secondary | ICD-10-CM | POA: Diagnosis not present

## 2017-04-17 DIAGNOSIS — E669 Obesity, unspecified: Secondary | ICD-10-CM

## 2017-04-17 DIAGNOSIS — R079 Chest pain, unspecified: Secondary | ICD-10-CM

## 2017-04-17 HISTORY — DX: Non-ST elevation (NSTEMI) myocardial infarction: I21.4

## 2017-04-17 HISTORY — DX: Unspecified urinary incontinence: R32

## 2017-04-17 HISTORY — DX: Gout, unspecified: M10.9

## 2017-04-17 HISTORY — DX: Dependence on other enabling machines and devices: Z99.89

## 2017-04-17 HISTORY — DX: Unspecified malignant neoplasm of skin, unspecified: C44.90

## 2017-04-17 HISTORY — DX: Personal history of other medical treatment: Z92.89

## 2017-04-17 HISTORY — DX: Obstructive sleep apnea (adult) (pediatric): G47.33

## 2017-04-17 LAB — TSH: TSH: 5.213 u[IU]/mL — AB (ref 0.350–4.500)

## 2017-04-17 LAB — TROPONIN I: TROPONIN I: 5.91 ng/mL — AB (ref <0.03)

## 2017-04-17 MED ORDER — ONDANSETRON HCL 4 MG/2ML IJ SOLN: 4.0000 mg | Freq: Four times a day (QID) | INTRAMUSCULAR | Status: DC | PRN

## 2017-04-17 MED ORDER — HYDRALAZINE HCL 20 MG/ML IJ SOLN
10.0000 mg | INTRAMUSCULAR | Status: DC | PRN
  Administered 2017-04-17 – 2017-04-18 (×2): 10 mg via INTRAVENOUS
  Filled 2017-04-17 (×2): qty 1

## 2017-04-17 MED ORDER — SODIUM CHLORIDE 0.9 % IV SOLN
INTRAVENOUS | Status: DC
  Administered 2017-04-18: 06:00:00 via INTRAVENOUS

## 2017-04-17 MED ORDER — DILTIAZEM HCL ER 60 MG PO CP12
60.0000 mg | ORAL_CAPSULE | Freq: Two times a day (BID) | ORAL | Status: DC
  Administered 2017-04-17 – 2017-04-18 (×2): 60 mg via ORAL
  Filled 2017-04-17 (×3): qty 1

## 2017-04-17 MED ORDER — CARBIDOPA-LEVODOPA 25-100 MG PO TABS: 1.0000 | ORAL_TABLET | ORAL | Status: DC

## 2017-04-17 MED ORDER — PANTOPRAZOLE SODIUM 40 MG PO TBEC
40.0000 mg | DELAYED_RELEASE_TABLET | Freq: Every morning | ORAL | Status: DC
  Administered 2017-04-18 – 2017-04-19 (×2): 40 mg via ORAL
  Filled 2017-04-17 (×2): qty 1

## 2017-04-17 MED ORDER — ASPIRIN 81 MG PO CHEW
81.0000 mg | CHEWABLE_TABLET | ORAL | Status: AC
  Administered 2017-04-18: 81 mg via ORAL
  Filled 2017-04-17: qty 1

## 2017-04-17 MED ORDER — HEPARIN (PORCINE) IN NACL 100-0.45 UNIT/ML-% IJ SOLN
850.0000 [IU]/h | INTRAMUSCULAR | Status: DC
  Administered 2017-04-17: 850 [IU]/h via INTRAVENOUS
  Filled 2017-04-17: qty 250

## 2017-04-17 MED ORDER — ACETAMINOPHEN 325 MG PO TABS: 650.0000 mg | ORAL_TABLET | ORAL | Status: DC | PRN

## 2017-04-17 MED ORDER — METOPROLOL TARTRATE 25 MG PO TABS
25.0000 mg | ORAL_TABLET | Freq: Two times a day (BID) | ORAL | Status: DC
  Administered 2017-04-17 – 2017-04-18 (×3): 25 mg via ORAL
  Filled 2017-04-17 (×4): qty 1

## 2017-04-17 MED ORDER — ATORVASTATIN CALCIUM 40 MG PO TABS
40.0000 mg | ORAL_TABLET | Freq: Every day | ORAL | Status: DC
  Administered 2017-04-18: 40 mg via ORAL
  Filled 2017-04-17: qty 1

## 2017-04-17 MED ORDER — CARBIDOPA-LEVODOPA 25-100 MG PO TABS
2.0000 | ORAL_TABLET | ORAL | Status: DC
  Administered 2017-04-17: 2 via ORAL
  Administered 2017-04-18: 1 via ORAL
  Filled 2017-04-17 (×2): qty 2

## 2017-04-17 MED ORDER — SODIUM CHLORIDE 0.9% FLUSH
3.0000 mL | Freq: Two times a day (BID) | INTRAVENOUS | Status: DC
  Administered 2017-04-18: 3 mL via INTRAVENOUS

## 2017-04-17 MED ORDER — SODIUM CHLORIDE 0.9% FLUSH: 3.0000 mL | INTRAVENOUS | Status: DC | PRN

## 2017-04-17 MED ORDER — LISINOPRIL 20 MG PO TABS
20.0000 mg | ORAL_TABLET | Freq: Every day | ORAL | Status: DC
  Administered 2017-04-18: 20 mg via ORAL
  Filled 2017-04-17 (×2): qty 1

## 2017-04-17 MED ORDER — BUDESONIDE 0.25 MG/2ML IN SUSP
0.2500 mg | Freq: Two times a day (BID) | RESPIRATORY_TRACT | Status: DC
  Administered 2017-04-17 – 2017-04-19 (×3): 0.25 mg via RESPIRATORY_TRACT
  Filled 2017-04-17 (×4): qty 2

## 2017-04-17 MED ORDER — SODIUM CHLORIDE 0.9 % IV SOLN: 250.0000 mL | INTRAVENOUS | Status: DC | PRN

## 2017-04-17 MED ORDER — HYDRALAZINE HCL 25 MG PO TABS
25.0000 mg | ORAL_TABLET | Freq: Four times a day (QID) | ORAL | Status: DC
  Administered 2017-04-17 – 2017-04-18 (×3): 25 mg via ORAL
  Filled 2017-04-17 (×3): qty 1

## 2017-04-17 MED ORDER — CARBIDOPA-LEVODOPA 25-100 MG PO TABS: 1.0000 | ORAL_TABLET | Freq: Two times a day (BID) | ORAL | Status: DC

## 2017-04-17 MED ORDER — NITROGLYCERIN 0.4 MG SL SUBL: 0.4000 mg | SUBLINGUAL_TABLET | SUBLINGUAL | Status: DC | PRN

## 2017-04-17 MED ORDER — ALBUTEROL SULFATE (2.5 MG/3ML) 0.083% IN NEBU: 2.5000 mg | INHALATION_SOLUTION | Freq: Four times a day (QID) | RESPIRATORY_TRACT | Status: DC | PRN

## 2017-04-17 MED ORDER — CITALOPRAM HYDROBROMIDE 10 MG PO TABS
10.0000 mg | ORAL_TABLET | Freq: Every day | ORAL | Status: DC
  Administered 2017-04-18 – 2017-04-19 (×2): 10 mg via ORAL
  Filled 2017-04-17 (×2): qty 1

## 2017-04-17 MED ORDER — BUDESONIDE 0.25 MG/2ML IN SUSP: 0.2500 mg | Freq: Two times a day (BID) | RESPIRATORY_TRACT | Status: DC

## 2017-04-17 MED ORDER — AMLODIPINE BESYLATE 10 MG PO TABS: 10.0000 mg | ORAL_TABLET | Freq: Every day | ORAL | Status: DC

## 2017-04-17 MED ORDER — HEPARIN BOLUS VIA INFUSION
2000.0000 [IU] | Freq: Once | INTRAVENOUS | Status: AC
  Administered 2017-04-17: 2000 [IU] via INTRAVENOUS
  Filled 2017-04-17: qty 2000

## 2017-04-17 MED ORDER — CARBIDOPA-LEVODOPA 25-100 MG PO TABS: 0.5000 | ORAL_TABLET | Freq: Two times a day (BID) | ORAL | Status: DC

## 2017-04-17 MED ORDER — ALBUTEROL SULFATE HFA 108 (90 BASE) MCG/ACT IN AERS: 1.0000 | INHALATION_SPRAY | Freq: Four times a day (QID) | RESPIRATORY_TRACT | Status: DC | PRN

## 2017-04-17 NOTE — Progress Notes (Signed)
ANTICOAGULATION CONSULT NOTE - Initial Consult  Pharmacy Consult for heparin  Indication: chest pain/ACS  Allergies  Allergen Reactions  . Amoxicillin Anaphylaxis, Swelling and Rash    Has patient had a PCN reaction causing immediate rash, facial/tongue/throat swelling, SOB or lightheadedness with hypotension: Yes PCN reaction causing SEVERE RASH INVOLVING MUCUS MEMBRANES or SKIN NECROSIS: Yes Has patient had a PCN reaction that required hospitalization: No Has patient had a PCN reaction occurring within the last 10 years: Yes   . Prednisone Shortness Of Breath and Nausea Only    Hypertension   . Amlodipine Itching and Other (See Comments)    BURNING  . Levothyroxine Other (See Comments)    Tired, shaky, tremors  . Meloxicam Swelling    "Burning "  . Nebivolol Rash  . Nsaids Nausea And Vomiting  . Valsartan Itching    Patient Measurements: Height: 5\' 3"  (160 cm) Weight: 167 lb 4.8 oz (75.9 kg) IBW/kg (Calculated) : 52.4  Heparin dosing weight: 68.5 kg  Vital Signs: Temp: 98 F (36.7 C) (08/13 1704) Temp Source: Oral (08/13 1704) BP: 200/84 (08/13 1735) Pulse Rate: 68 (08/13 1735)  Labs: No results for input(s): HGB, HCT, PLT, APTT, LABPROT, INR, HEPARINUNFRC, HEPRLOWMOCWT, CREATININE, CKTOTAL, CKMB, TROPONINI in the last 72 hours.  CrCl cannot be calculated (Patient's most recent lab result is older than the maximum 21 days allowed.).   Medical History: Past Medical History:  Diagnosis Date  . Anemia   . Anxiety    takes Klonopin as bedtime  . Asthma   . CHF (congestive heart failure) ([place])   . Constipation    takes Colace as needed  . Depression    takes Citalopram daily  . Dyspnea   . Hypertension    takes Verapamil daily  . Hypothyroidism   . Kidney disease    has frequent UTS's.  just finishing up antibiotics now.  . Neuromuscular disorder ([place])    parkinson's disease    dx 4 yr ago  . Overactive bladder   . Parkinson's disease ([place])    takes  Sinemet daily  . Peripheral edema    take Lasix as needed  . Pneumonia   . Seasonal allergies   . Sleep apnea    tested in [place], 5-6 mths ago.  wears cpap. "sitting on 5"  . Stroke [facility])    "a bunch"   no deficits,  "cleared up".  Last one 4 yr ago  . Tachycardia     Assessment: 52 YOF transferred from [place] with NSTEMI, pharmacy is consulted to start IV heparin. Per [facility] chart record, pt received lovenox 75 mg this morning at ~ 0654.    Goal of Therapy:  Heparin level 0.3-0.7 units/ml Monitor platelets by anticoagulation protocol: Yes   Plan:  Heparin bolus 2000 units bolus (smaller bolus since pt received lovenox this morning) Heparin infusion 850 units/hr F/u heparin level at 0200 F/u plans for cath  Maryanna Shape, PharmD, BCPS  Clinical Pharmacist  Pager: [phone]   04/17/2017,6:49 PM

## 2017-04-17 NOTE — Progress Notes (Addendum)
Pt  BP 200/82. Paged Dr. Marily Memos in relation to admit order and to update her on pt elevated BP.. Paged Dr. Sheran Fava for new order.

## 2017-04-17 NOTE — H&P (Addendum)
History and Physical    Rokia Bosket QIO:[id number] DOB: June 07, 1946 DOA: 04/17/2017  Referring MD/NP/PA: Sloan Leiter PCP: [name], MD   Patient coming from: [place]  Chief Complaint: chest pain  HPI: Ana Salazar is a 71 y.o. female with history of disease disease status post deep brain stimulator, hypertension, smoking, history of SVT, asthma presented to [facility] with palpitations and chest pains. According to the H&P at [facility], she awoke suddenly at 3 AM on the date of admission and developed palpitations with squeezing chest pain and pressure. Upon my review, she had retrosternal chest pressure that lasted several minutes and was associated with increased shortness of breath and lightheadedness, but no nausea or vomiting. In the ER, she was found to be in A. fib with RVR rate in the 150s. She was started on a Cardizem drip and she spontaneously converted to sinus rhythm. Her troponins went up to 2.9. The case was discussed with the patient's primary cardiologist Dr. Wynonia Lawman who recommended that the patient be transferred to [facility] for further workup.  Patient has not had any further chest pains since arrival at [facility]. She states that she is still feeling somewhat Khianna Blazina of breath at rest and feels better when she has oxygen in place. She had some swelling in her feet which is gone down now. She denies current palpitations.  Review of Systems:  General:  Denies fevers, chills, weight change HEENT:  Somewhat hard of hearing, denies sinus congestion, sore throat CV:  Intermittent lower extremity edema has gone down PULM:  Denies SOB, cough.   GI:  Denies nausea, vomiting, constipation, diarrhea.   GU:  Denies dysuria, has chronic frequency, urgency ENDO:  Denies polyuria, polydipsia.   HEME:  Denies blood in stools, abnormal bruising or bleeding.  LYMPH:  Denies lymphadenopathy.   MSK:  Denies arthralgias, myalgias.   DERM:  Denies skin rash or  ulcer.   NEURO:  Denies new focal numbness or weakness, slurred speech, confusion PSYCH:  Denies anxiety and depression.    Past Medical History:  Diagnosis Date  . Anemia   . Anxiety    takes Klonopin as bedtime  . Asthma   . CHF (congestive heart failure) ([place])   . Constipation    takes Colace as needed  . Depression    takes Citalopram daily  . Dyspnea   . Hypertension    takes Verapamil daily  . Hypothyroidism   . Kidney disease    has frequent UTS's.  just finishing up antibiotics now.  . Neuromuscular disorder ([place])    parkinson's disease    dx 4 yr ago  . Overactive bladder   . Parkinson's disease ([place])    takes Sinemet daily  . Peripheral edema    take Lasix as needed  . Pneumonia   . Seasonal allergies   . Sleep apnea    tested in [place], 5-6 mths ago.  wears cpap. "sitting on 5"  . Stroke [facility])    "a bunch"   no deficits,  "cleared up".  Last one 4 yr ago  . Tachycardia     Past Surgical History:  Procedure Laterality Date  . ABDOMINAL HYSTERECTOMY    . CESAREAN SECTION    . fibroid tumor    . MINOR PLACEMENT OF FIDUCIAL N/A 11/22/2016   Procedure: Fiducial placement;  Surgeon: Erline Levine, MD;  Location: [place];  Service: Neurosurgery;  Laterality: N/A;  Fiducial placement  . PULSE GENERATOR IMPLANT Right 12/06/2016  Procedure: Unilateral PULSE GENERATOR IMPLANT;  Surgeon: Erline Levine, MD;  Location: [place];  Service: Neurosurgery;  Laterality: Right;  Unilateral PULSE GENERATOR IMPLANT  . RADIOLOGY WITH ANESTHESIA N/A 09/13/2016   Procedure: MRI BRAIN WITH AND WITHOUT;  Surgeon: Medication Radiologist, MD;  Location: [place];  Service: Radiology;  Laterality: N/A;  . skin cancer removed left arm    . SUBTHALAMIC STIMULATOR INSERTION Bilateral 11/29/2016   Procedure: Bilateral Deep brain stimulator placement;  Surgeon: Erline Levine, MD;  Location: [place];  Service: Neurosurgery;  Laterality: Bilateral;  Bilateral Deep brain stimulator placement  .  THYROIDECTOMY, PARTIAL    . TONSILLECTOMY    . TUBAL LIGATION       reports that she quit smoking about 4 years ago. She has never used smokeless tobacco. She reports that she does not drink alcohol or use drugs.  Allergies  Allergen Reactions  . Amoxicillin Anaphylaxis, Swelling and Rash    Has patient had a PCN reaction causing immediate rash, facial/tongue/throat swelling, SOB or lightheadedness with hypotension: Yes PCN reaction causing SEVERE RASH INVOLVING MUCUS MEMBRANES or SKIN NECROSIS: Yes Has patient had a PCN reaction that required hospitalization: No Has patient had a PCN reaction occurring within the last 10 years: Yes   . Prednisone Shortness Of Breath and Nausea Only    Hypertension   . Amlodipine Itching and Other (See Comments)    BURNING  . Levothyroxine Other (See Comments)    Tired, shaky, tremors  . Meloxicam Swelling    "Burning "  . Nebivolol Rash  . Nsaids Nausea And Vomiting  . Valsartan Itching    Family History  Problem Relation Age of Onset  . Heart disease Father     Prior to Admission medications   Medication Sig Start Date End Date Taking? Authorizing Provider  albuterol (PROVENTIL HFA;VENTOLIN HFA) 108 (90 Base) MCG/ACT inhaler Inhale 1 puff into the lungs every 6 (six) hours as needed for wheezing or shortness of breath.    [provider]  carbidopa-levodopa (SINEMET CR) 50-200 MG tablet Take 1 tablet by mouth at bedtime. 09/20/16   Tat, Eustace Quail, DO  carbidopa-levodopa (SINEMET IR) 25-100 MG tablet 2 tablets at 7 AM/2 tablet at 10 AM//2 tablets at 1 PM/1 tablet at 4 PM and one tablet at 7 PM Patient taking differently: Take 1-2 tablets by mouth See admin instructions. Pt takes 2 tablets at 0700, 1000, 1300 - pt takes 1 tablet at 1600 07/25/16   Tat, Rebecca S, DO  citalopram (CELEXA) 20 MG tablet Take 10 mg by mouth daily.     [provider]  clonazePAM (KLONOPIN) 0.5 MG tablet TAKE 1/2 TABLET BY MOUTH AT BEDTIME 09/01/16    Tat, Rebecca S, DO  CRANBERRY PO Take 1 tablet by mouth daily.     [provider]  docusate sodium (COLACE) 100 MG capsule Take 100 mg by mouth daily as needed for mild constipation.     [provider]  Elastic Bandages & Supports (ABDOMINAL BINDER/ELASTIC MED) MISC 1 Device by Does not apply route daily. 02/03/17   Tat, Eustace Quail, DO  entacapone (COMTAN) 200 MG tablet TAKE 1 TABLET THREE TIMES PER DAY 09/20/16   Tat, Rebecca S, DO  Fluticasone Furoate (ARNUITY ELLIPTA) 100 MCG/ACT AEPB Inhale 1 puff into the lungs daily.     [provider]  furosemide (LASIX) 20 MG tablet TAKE 1/2 TABLET DAILY AS NEEDED FOR SIGNIFICANT FLUID SWELLING ONLY 05/24/16   [provider]  potassium chloride (K-DUR,KLOR-CON) 10 MEQ tablet Take 10 mEq by mouth daily at 12 noon.     [provider]  verapamil (CALAN) 80 MG tablet Take 80 mg by mouth 2 (two) times daily.     [provider]    Physical Exam: Vitals:   04/17/17 1704 04/17/17 1735 04/17/17 1845 04/17/17 1851  BP: (!) 206/86 (!) 200/84 (!) 200/84 (!) 180/92  Pulse:  68  66  Resp:  19    Temp: 98 F (36.7 C)     TempSrc: Oral     SpO2:  97%    Weight: 75.9 kg (167 lb 4.8 oz)     Height: 5\' 3"  (1.6 m)       Constitutional:  NAD, calm, mildly tachypneic with some subcostal retractions Eyes: PERRL, lids and conjunctivae normal ENMT:  Moist mucous membranes.  Oropharynx nonerythematous, no exudates.   Neck:  No nuchal rigidity, no masses Respiratory:  No wheezes, rales, or rhonchi Cardiovascular: Regular rate and rhythm,  1/6 systolic murmur.  2+ radial pulses.  Deep brain stimulator box implanted on right chest. Abdomen:  Normal active bowel sounds, soft, nondistended, nontender Musculoskeletal: Normal muscle tone and bulk.  No contractures.  Skin:  no rashes, abrasions, or ulcers Neurologic:  CN 2-12 grossly intact. Sensation intact to light touch, strength 5/5 throughout Psychiatric:  Alert  and oriented x 3.  Slightly flattened affect.   Labs on Admission: I have personally reviewed following labs and imaging studies  CBC: No results for input(s): WBC, NEUTROABS, HGB, HCT, MCV, PLT in the last 168 hours. Basic Metabolic Panel: No results for input(s): NA, K, CL, CO2, GLUCOSE, BUN, CREATININE, CALCIUM, MG, PHOS in the last 168 hours. GFR: CrCl cannot be calculated (Patient's most recent lab result is older than the maximum 21 days allowed.). Liver Function Tests: No results for input(s): AST, ALT, ALKPHOS, BILITOT, PROT, ALBUMIN in the last 168 hours. No results for input(s): LIPASE, AMYLASE in the last 168 hours. No results for input(s): AMMONIA in the last 168 hours. Coagulation Profile: No results for input(s): INR, PROTIME in the last 168 hours. Cardiac Enzymes: No results for input(s): CKTOTAL, CKMB, CKMBINDEX, TROPONINI in the last 168 hours. BNP (last 3 results) No results for input(s): PROBNP in the last 8760 hours. HbA1C: No results for input(s): HGBA1C in the last 72 hours. CBG: No results for input(s): GLUCAP in the last 168 hours. Lipid Profile: No results for input(s): CHOL, HDL, LDLCALC, TRIG, CHOLHDL, LDLDIRECT in the last 72 hours. Thyroid Function Tests: No results for input(s): TSH, T4TOTAL, FREET4, T3FREE, THYROIDAB in the last 72 hours. Anemia Panel: No results for input(s): VITAMINB12, FOLATE, FERRITIN, TIBC, IRON, RETICCTPCT in the last 72 hours. Urine analysis:    Component Value Date/Time   COLORURINE AMBER (A) 12/02/2016 1221   APPEARANCEUR CLEAR 12/02/2016 1221   LABSPEC 1.026 12/02/2016 1221   PHURINE 5.0 12/02/2016 1221   GLUCOSEU NEGATIVE 12/02/2016 1221   HGBUR SMALL (A) 12/02/2016 1221   BILIRUBINUR SMALL (A) 12/02/2016 1221   KETONESUR 5 (A) 12/02/2016 1221   PROTEINUR NEGATIVE 12/02/2016 1221   NITRITE NEGATIVE 12/02/2016 1221   LEUKOCYTESUR NEGATIVE 12/02/2016 1221   Sepsis  Labs: @LABRCNTIP (procalcitonin:4,lacticidven:4) )No results found for this or any previous visit (from the past 240 hour(s)).   Radiological Exams on Admission: No results found.  EKG: Independently reviewed. [place] ECG during tachycardia:  A-fib with RVR vs. SVT (poor baseline) with rate in 154.  T-wave inversions in the lateral  leads, possible ST segment elevation in aVR, possible ST segment depressions in V2-V4.  No repeat ECG available.    Assessment/Plan Principal Problem:   NSTEMI (non-ST elevated myocardial infarction) ([place]) Active Problems:   Parkinson's disease ([place])   Uncontrolled hypertension   NSTEMI, currently chest pain free but feeling SOB -  Telemetry  -  Repeat ECG -  Patient not on aspirin due to NSAID allergy -  Change lovenox to heparin in anticipation of LHC in next 24-48h -  Continue metoprolol and atorvastatin started at [facility] -  NTG prn  Atrial fibrillation with RVR, new onset, currently back in SR -  Heparin gtt for now -  On beta blocker  Hypertension, uncontrolled in 188C systolic despite multiple medications added prior to transfer -  Resume diltiazem 60mg  po BID -  Continue Lisinopril 20mg  -  Hydralazine prn -  Metoprolol 25mg  po BID -  Allergic to ARB, some beta blockers, and norvasc  Parkinson's, patient reported totally different doses of her medications to myself than she did to the pharmacist.   -  Continue home medications Sinimet IR 0.5-1 tab four times daily -  No longer on entacapone  COPD, with dyspnea -  Continue breo -  Albuterol prn  Lower extremity swelling -  Hold lasix in anticipation of possible cath  Hypothyroidism (not on synthroid) -  TSH   DVT prophylaxis: heparin  Code Status: full code Family Communication:  Patient alone Disposition Plan: likely home in 2 days Consults called: Cardiology, Dr. Wynonia Lawman  Admission status: inpatient due to risk of acute decompensation in setting of SNFEMI, recent a-fib with RVR  and ongoing dyspnea and uncontrolled hypertension.     Janece Canterbury MD [facility] Pager [phone]  If 7PM-7AM, please contact night-coverage [email or web address] Password [facility]  04/17/2017, 7:04 PM

## 2017-04-17 NOTE — Consult Note (Signed)
Cardiology Consult Note  Admit date: 04/17/2017 Name: Ana Salazar 71 y.o.  female DOB:  1946/07/05 MRN:  [id number]  Today's date:  04/17/2017  Referring Physician:    [facility]   Primary Physician:   Dr. Katherina Right  Reason for Consultation:   Arrhythmia, abnormal cardiac enzymes  IMPRESSIONS: 1.  Non-STEMI with abnormal troponins in the setting of supraventricular tachycardia 2.  Recurrent SVT following cessation of verapamil which was a chronic medicine 3.  Hypertensive heart disease poorly controlled 4.  History of aortic stenosis that is mild previously  5.  Parkinsonism with recent deep brain stimulator implant 6.  Frequent falling episodes  RECOMMENDATION: I would restart her verapamil.  In light of her abnormal troponins we'll recheck an EKG and plan cardiac catheterization to exclude significant coronary artery disease.  Repeat the echocardiogram. Cardiac catheterization was discussed with the patient fully including risks of myocardial infarction, death, stroke, bleeding, arrhythmia, dye allergy, renal insufficiency or bleeding.  The patient understands and is willing to proceed.  Possibility of intervention at the same time also discussed with patient and daughter they understand and are agreeable to proceed   HISTORY: This 71 year old female has a long-standing history of severe hypertension as well as SVT.  She has long-standing SVT since age 76.  I initially began seeing her in 2007 at which point in time her physicians would not refill her verapamil that had controlled her arrhythmia well through the years.  I had placed her on verapamil and we found her blood pressure to be quite difficult to control and she was intolerant to a number of medicines.  I eventually referred her to the hypertension clinic at [facility] but she somehow became lost to follow-up there and her blood pressure has not been very well controlled over the years.  She also has a systolic  murmur and in 1962 had an echocardiogram showing trivial aortic stenosis.  She recently and has significant parkinsonism with a significant movement disorder with chart out get dyskinesias and earlier this year underwent a deep brain stimulator implant at [facility].  I have seen her since then with frequent falling episodes and it has been difficult to determine whether or not this is been related to blood pressure or to adjustment of her stimulator.  At the first of the month a center that she was seeing stopped the verapamil that had previously well-controlled her SVT and placed her on diltiazem.  She stated that her weakness got better after that but then she presented to the [facility] with pressure in her chest and shortness of breath and was found to be in rapid SVT.  There is a mention in the chart of atrial fibrillation although no strips document this.  Her troponins rose from 0.01-8.96 consistent with a non-STEMI and she is transferred here for further evaluation.  BNP level was 637.  Glucose was 186.  She is currently pain-free.  Past Medical History:  Diagnosis Date  . Anemia   . Anxiety    takes Klonopin as bedtime  . Asthma   . Depression    takes Citalopram daily  . Hypertension    takes Verapamil daily  . Hypothyroidism   . Overactive bladder   . Parkinson's disease ([place])    takes Sinemet daily  . Peripheral edema    take Lasix as needed  . Pneumonia   . Seasonal allergies   . Sleep apnea    tested in [place], 5-6 mths ago.  wears cpap. "sitting on 5"  .       Marland Kitchen Tachycardia       Past Surgical History:  Procedure Laterality Date  . ABDOMINAL HYSTERECTOMY    . CESAREAN SECTION    . fibroid tumor    . MINOR PLACEMENT OF FIDUCIAL N/A 11/22/2016   Procedure: Fiducial placement;  Surgeon: Erline Levine, MD;  Location: [place];  Service: Neurosurgery;  Laterality: N/A;  Fiducial placement  . PULSE GENERATOR IMPLANT Right 12/06/2016   Procedure: Unilateral PULSE  GENERATOR IMPLANT;  Surgeon: Erline Levine, MD;  Location: [place];  Service: Neurosurgery;  Laterality: Right;  Unilateral PULSE GENERATOR IMPLANT  . RADIOLOGY WITH ANESTHESIA N/A 09/13/2016   Procedure: MRI BRAIN WITH AND WITHOUT;  Surgeon: Medication Radiologist, MD;  Location: [place];  Service: Radiology;  Laterality: N/A;  . skin cancer removed left arm    . SUBTHALAMIC STIMULATOR INSERTION Bilateral 11/29/2016   Procedure: Bilateral Deep brain stimulator placement;  Surgeon: Erline Levine, MD;  Location: [place];  Service: Neurosurgery;  Laterality: Bilateral;  Bilateral Deep brain stimulator placement  . THYROIDECTOMY, PARTIAL    . TONSILLECTOMY    . TUBAL LIGATION      Allergies:  is allergic to amoxicillin; prednisone; amlodipine; levothyroxine; meloxicam; nebivolol; nsaids; and valsartan.   Medications: Prior to Admission medications   Medication Sig Start Date End Date Taking? Authorizing Provider  albuterol (PROVENTIL HFA;VENTOLIN HFA) 108 (90 Base) MCG/ACT inhaler Inhale 1 puff into the lungs every 6 (six) hours as needed for wheezing or shortness of breath.   Yes [provider]  carbidopa-levodopa (SINEMET CR) 50-200 MG tablet Take 1 tablet by mouth at bedtime. 09/20/16  Yes Tat, Eustace Quail, DO  carbidopa-levodopa (SINEMET IR) 25-100 MG tablet 2 tablets at 7 AM/2 tablet at 10 AM//2 tablets at 1 PM/1 tablet at 4 PM and one tablet at 7 PM Patient taking differently: Take 1-2 tablets by mouth See admin instructions. Pt takes 2 tablets at 0700, 1000, 1300 - pt takes 1 tablet at 1600 07/25/16  Yes Tat, Eustace Quail, DO  citalopram (CELEXA) 20 MG tablet Take 10 mg by mouth daily.    Yes [provider]  Fluticasone Furoate (ARNUITY ELLIPTA) 100 MCG/ACT AEPB Inhale 1 puff into the lungs daily.    Yes [provider]  furosemide (LASIX) 20 MG tablet TAKE 1/2 TABLET DAILY AS NEEDED FOR SIGNIFICANT FLUID SWELLING ONLY 05/24/16  Yes [provider]  potassium chloride  (K-DUR,KLOR-CON) 10 MEQ tablet Take 10 mEq by mouth daily.    Yes [provider]  CRANBERRY PO Take 1 tablet by mouth daily.     [provider]  docusate sodium (COLACE) 100 MG capsule Take 100 mg by mouth daily as needed for mild constipation.     [provider]  Elastic Bandages & Supports (ABDOMINAL BINDER/ELASTIC MED) MISC 1 Device by Does not apply route daily. 02/03/17   Tat, Eustace Quail, DO  pantoprazole (PROTONIX) 40 MG tablet Take 40 mg by mouth every morning. 03/22/17   [provider]  verapamil (CALAN) 80 MG tablet Take 80 mg by mouth 2 (two) times daily.     [provider]    Family History: Family Status  Relation Status  . Mother Deceased       heart disease, DM, complications of fall  . Father Deceased       MI  . Sister Deceased       DM, CAD, renal failure  .  Sister Alive       healthy  . Child Alive       healthy    Social History:   reports that she quit smoking about 4 years ago. She has never used smokeless tobacco. She reports that she does not drink alcohol or use drugs.   Review of Systems: She has had depression in the past and also has significant psychosocial stress in the past.  She has some moderate shortness of breath and mild edema.  She also has significant difficulty with tremor still although the turned out dyskinesias have improved since her deep brain stimulation.  She has some nausea in the past.  She has pain in her ankles and also has dizziness as well as frequent falling episodes.  She has partial hearing loss and has a 20 pound weight loss over the past spring.  Physical Exam: BP (!) 180/92   Pulse 66   Temp 98 F (36.7 C) (Oral)   Resp 19   Ht 5\' 3"  (1.6 m)   Wt 75.9 kg (167 lb 4.8 oz)   SpO2 97%   BMI 29.64 kg/m   General appearance: She is a pleasant mildly obese female in no acute distress Head: Normocephalic, without obvious abnormality, atraumatic Eyes: conjunctivae/corneas clear.  PERRL, EOM's intact. Fundi Not examined Neck: no adenopathy, no carotid bruit, no JVD and supple, symmetrical, trachea midline Lungs: clear to auscultation bilaterally Heart: Regular rhythm, normal S1 and S2, no S3, 2/6 systolic murmur at aortic area Abdomen: soft, non-tender; bowel sounds normal; no masses,  no organomegaly Pelvic: deferred Extremities: extremities normal, atraumatic, no cyanosis or edema Pulses: 2+ and symmetric Skin: Skin color, texture, turgor normal. No rashes or lesions Neurologic: Mild resting tremor, gait not formally tested Psych: Alert and oriented x 3 Labs:   Radiology:  Chest x-ray report reveals cardiomegaly  EKG: Reviewed from [facility] shows supraventricular tachycardia Independently reviewed by me  Signed:  W. Doristine Church MD [facility]   Cardiology Consultant  04/17/2017, 7:40 PM

## 2017-04-18 ENCOUNTER — Encounter (HOSPITAL_COMMUNITY): Payer: Self-pay | Admitting: Interventional Cardiology

## 2017-04-18 ENCOUNTER — Inpatient Hospital Stay (HOSPITAL_COMMUNITY)
Admission: AD | Disposition: A | Discharge status: Home / Self Care | Payer: Self-pay | Source: Other Acute Inpatient Hospital | Attending: Family Medicine

## 2017-04-18 ENCOUNTER — Inpatient Hospital Stay (HOSPITAL_COMMUNITY): Payer: Medicare (Managed Care)

## 2017-04-18 DIAGNOSIS — E669 Obesity, unspecified: Secondary | ICD-10-CM

## 2017-04-18 DIAGNOSIS — I5032 Chronic diastolic (congestive) heart failure: Secondary | ICD-10-CM

## 2017-04-18 DIAGNOSIS — I2510 Atherosclerotic heart disease of native coronary artery without angina pectoris: Secondary | ICD-10-CM | POA: Diagnosis present

## 2017-04-18 DIAGNOSIS — I119 Hypertensive heart disease without heart failure: Secondary | ICD-10-CM | POA: Diagnosis present

## 2017-04-18 DIAGNOSIS — I214 Non-ST elevation (NSTEMI) myocardial infarction: Principal | ICD-10-CM

## 2017-04-18 HISTORY — PX: LEFT HEART CATH AND CORONARY ANGIOGRAPHY: CATH118249

## 2017-04-18 LAB — CBC
HCT: 38.7 % (ref 36.0–46.0)
HEMOGLOBIN: 12.8 g/dL (ref 12.0–15.0)
MCH: 29.8 pg (ref 26.0–34.0)
MCHC: 33.1 g/dL (ref 30.0–36.0)
MCV: 90.2 fL (ref 78.0–100.0)
Platelets: 169 10*3/uL (ref 150–400)
RBC: 4.29 MIL/uL (ref 3.87–5.11)
RDW: 13.7 % (ref 11.5–15.5)
WBC: 7.5 10*3/uL (ref 4.0–10.5)

## 2017-04-18 LAB — T4, FREE: Free T4: 1.07 ng/dL (ref 0.61–1.12)

## 2017-04-18 LAB — ECHOCARDIOGRAM COMPLETE
Ao-asc: 36 cm
E decel time: 373 msec
E/e' ratio: 13.36
FS: 32 % (ref 28–44)
HEIGHTINCHES: 63 in
IV/PV OW: 1.28
LA vol A4C: 68.5 ml
LA vol index: 40.5 mL/m2
LA vol: 75.1 mL
LADIAMINDEX: 2.21 cm/m2
LASIZE: 41 mm
LEFT ATRIUM END SYS DIAM: 41 mm
LV PW d: 11.9 mm — AB (ref 0.6–1.1)
LV TDI E'LATERAL: 5.96
LVEEAVG: 13.36
LVEEMED: 13.36
LVELAT: 5.96 cm/s
LVOT area: 3.14 cm2
LVOT diameter: 20 mm
MV Dec: 373
MV Peak grad: 3 mmHg
MV pk A vel: 86.9 m/s
MVPKEVEL: 79.6 m/s
RV LATERAL S' VELOCITY: 10.5 cm/s
RV TAPSE: 20.7 mm
TDI e' medial: 3.07
WEIGHTICAEL: 2656.1 [oz_av]

## 2017-04-18 LAB — PROTIME-INR
INR: 1.07
Prothrombin Time: 13.9 seconds (ref 11.4–15.2)

## 2017-04-18 LAB — BASIC METABOLIC PANEL
ANION GAP: 6 (ref 5–15)
BUN: 13 mg/dL (ref 6–20)
CALCIUM: 9.2 mg/dL (ref 8.9–10.3)
CO2: 26 mmol/L (ref 22–32)
Chloride: 107 mmol/L (ref 101–111)
Creatinine, Ser: 0.74 mg/dL (ref 0.44–1.00)
Glucose, Bld: 95 mg/dL (ref 65–99)
Potassium: 3.4 mmol/L — ABNORMAL LOW (ref 3.5–5.1)
Sodium: 139 mmol/L (ref 135–145)

## 2017-04-18 LAB — HEPARIN LEVEL (UNFRACTIONATED): HEPARIN UNFRACTIONATED: 0.34 [IU]/mL (ref 0.30–0.70)

## 2017-04-18 LAB — LIPID PANEL
CHOLESTEROL: 158 mg/dL (ref 0–200)
HDL: 38 mg/dL — ABNORMAL LOW (ref >40)
LDL CALC: 101 mg/dL — AB (ref 0–99)
Total CHOL/HDL Ratio: 4.2 RATIO
Triglycerides: 95 mg/dL (ref <150)
VLDL: 19 mg/dL (ref 0–40)

## 2017-04-18 LAB — TROPONIN I
Troponin I: 3.35 ng/mL (ref <0.03)
Troponin I: 2.81 ng/mL (ref <0.03)

## 2017-04-18 SURGERY — LEFT HEART CATH AND CORONARY ANGIOGRAPHY
Anesthesia: LOCAL

## 2017-04-18 MED ORDER — HEPARIN SODIUM (PORCINE) 1000 UNIT/ML IJ SOLN
INTRAMUSCULAR | Status: DC | PRN
Start: 2017-04-18 — End: 2017-04-18
  Administered 2017-04-18: 4000 [IU] via INTRAVENOUS

## 2017-04-18 MED ORDER — SODIUM CHLORIDE 0.9 % IV SOLN: 250.0000 mL | INTRAVENOUS | Status: DC | PRN

## 2017-04-18 MED ORDER — LIDOCAINE HCL (PF) 1 % IJ SOLN
INTRAMUSCULAR | Status: AC
  Filled 2017-04-18: qty 30

## 2017-04-18 MED ORDER — MIDAZOLAM HCL 2 MG/2ML IJ SOLN
INTRAMUSCULAR | Status: AC
  Filled 2017-04-18: qty 2

## 2017-04-18 MED ORDER — FENTANYL CITRATE (PF) 100 MCG/2ML IJ SOLN
INTRAMUSCULAR | Status: DC | PRN
  Administered 2017-04-18: 25 ug via INTRAVENOUS

## 2017-04-18 MED ORDER — FENTANYL CITRATE (PF) 100 MCG/2ML IJ SOLN
INTRAMUSCULAR | Status: AC
  Filled 2017-04-18: qty 2

## 2017-04-18 MED ORDER — MIDAZOLAM HCL 2 MG/2ML IJ SOLN
INTRAMUSCULAR | Status: DC | PRN
  Administered 2017-04-18: 1 mg via INTRAVENOUS

## 2017-04-18 MED ORDER — SODIUM CHLORIDE 0.9% FLUSH
3.0000 mL | Freq: Two times a day (BID) | INTRAVENOUS | Status: DC
  Administered 2017-04-18 (×2): 3 mL via INTRAVENOUS

## 2017-04-18 MED ORDER — SODIUM CHLORIDE 0.9% FLUSH: 3.0000 mL | INTRAVENOUS | Status: DC | PRN

## 2017-04-18 MED ORDER — CARBIDOPA-LEVODOPA 25-100 MG PO TABS
1.0000 | ORAL_TABLET | Freq: Every day | ORAL | Status: DC
  Administered 2017-04-18 – 2017-04-19 (×2): 1 via ORAL
  Filled 2017-04-18: qty 1

## 2017-04-18 MED ORDER — HYDRALAZINE HCL 50 MG PO TABS
50.0000 mg | ORAL_TABLET | Freq: Three times a day (TID) | ORAL | Status: DC
  Administered 2017-04-18 – 2017-04-19 (×3): 50 mg via ORAL
  Filled 2017-04-18 (×3): qty 1

## 2017-04-18 MED ORDER — LIDOCAINE HCL (PF) 1 % IJ SOLN
INTRAMUSCULAR | Status: DC | PRN
  Administered 2017-04-18: 2 mL

## 2017-04-18 MED ORDER — POTASSIUM CHLORIDE 20 MEQ/15ML (10%) PO SOLN
40.0000 meq | Freq: Once | ORAL | Status: AC
  Administered 2017-04-18: 40 meq via ORAL
  Filled 2017-04-18: qty 30

## 2017-04-18 MED ORDER — ONDANSETRON HCL 4 MG/2ML IJ SOLN: 4.0000 mg | Freq: Four times a day (QID) | INTRAMUSCULAR | Status: DC | PRN

## 2017-04-18 MED ORDER — VERAPAMIL HCL 80 MG PO TABS
80.0000 mg | ORAL_TABLET | Freq: Two times a day (BID) | ORAL | Status: DC
  Administered 2017-04-18 – 2017-04-19 (×2): 80 mg via ORAL
  Filled 2017-04-18 (×2): qty 1

## 2017-04-18 MED ORDER — HEPARIN (PORCINE) IN NACL 2-0.9 UNIT/ML-% IJ SOLN
INTRAMUSCULAR | Status: AC
  Filled 2017-04-18: qty 1000

## 2017-04-18 MED ORDER — HEPARIN (PORCINE) IN NACL 2-0.9 UNIT/ML-% IJ SOLN
INTRAMUSCULAR | Status: AC | PRN
Start: 2017-04-18 — End: 2017-04-18
  Administered 2017-04-18: 1000 mL

## 2017-04-18 MED ORDER — VERAPAMIL HCL 2.5 MG/ML IV SOLN
INTRAVENOUS | Status: AC
  Filled 2017-04-18: qty 2

## 2017-04-18 MED ORDER — VERAPAMIL HCL 2.5 MG/ML IV SOLN
INTRAVENOUS | Status: DC | PRN
  Administered 2017-04-18 (×2): 10 mL via INTRA_ARTERIAL

## 2017-04-18 MED ORDER — SODIUM CHLORIDE 0.9 % IV SOLN
INTRAVENOUS | Status: DC
  Administered 2017-04-18: 11:00:00 via INTRAVENOUS

## 2017-04-18 MED ORDER — HEPARIN SODIUM (PORCINE) 1000 UNIT/ML IJ SOLN
INTRAMUSCULAR | Status: AC
  Filled 2017-04-18: qty 1

## 2017-04-18 MED ORDER — CARBIDOPA-LEVODOPA ER 25-100 MG PO TBCR
0.5000 | EXTENDED_RELEASE_TABLET | Freq: Two times a day (BID) | ORAL | Status: DC
  Administered 2017-04-18 – 2017-04-19 (×2): 0.5 via ORAL
  Filled 2017-04-18 (×3): qty 0.5

## 2017-04-18 MED ORDER — ACETAMINOPHEN 325 MG PO TABS
650.0000 mg | ORAL_TABLET | ORAL | Status: DC | PRN
  Administered 2017-04-18: 650 mg via ORAL
  Filled 2017-04-18: qty 2

## 2017-04-18 MED ORDER — NITROGLYCERIN 1 MG/10 ML FOR IR/CATH LAB
INTRA_ARTERIAL | Status: AC
  Filled 2017-04-18: qty 10

## 2017-04-18 MED ORDER — IOPAMIDOL (ISOVUE-370) INJECTION 76%
INTRAVENOUS | Status: AC
  Filled 2017-04-18: qty 100

## 2017-04-18 MED ORDER — CARBIDOPA-LEVODOPA ER 25-100 MG PO TBCR
1.0000 | EXTENDED_RELEASE_TABLET | Freq: Every day | ORAL | Status: DC
  Administered 2017-04-18: 1 via ORAL
  Filled 2017-04-18 (×2): qty 1

## 2017-04-18 SURGICAL SUPPLY — 11 items
CATH 5FR JL3.5 JR4 ANG PIG MP (CATHETERS) ×2 IMPLANT
COVER PRB 48X5XTLSCP FOLD TPE (BAG) ×1 IMPLANT
COVER PROBE 5X48 (BAG) ×1
DEVICE RAD COMP TR BAND LRG (VASCULAR PRODUCTS) ×2 IMPLANT
GLIDESHEATH SLEND SS 6F .021 (SHEATH) ×2 IMPLANT
GUIDEWIRE INQWIRE 1.5J.035X260 (WIRE) ×1 IMPLANT
INQWIRE 1.5J .035X260CM (WIRE) ×2
KIT HEART LEFT (KITS) ×2 IMPLANT
PACK CARDIAC CATHETERIZATION (CUSTOM PROCEDURE TRAY) ×2 IMPLANT
TRANSDUCER W/STOPCOCK (MISCELLANEOUS) ×2 IMPLANT
TUBING CIL FLEX 10 FLL-RA (TUBING) ×2 IMPLANT

## 2017-04-18 NOTE — Progress Notes (Signed)
  Echocardiogram 2D Echocardiogram has been performed.  Ana Salazar 04/18/2017, 1:38 PM

## 2017-04-18 NOTE — Progress Notes (Signed)
Right wrist TR band discontinued per protocol. Pt had no complications. Gauze and tegaderm dressing placed to right wrist.

## 2017-04-18 NOTE — Interval H&P Note (Signed)
Cath Lab Visit (complete for each Cath Lab visit)  Clinical Evaluation Leading to the Procedure:   ACS: Yes.    Non-ACS:    Anginal Classification: CCS IV  Anti-ischemic medical therapy: Minimal Therapy (1 class of medications)  Non-Invasive Test Results: No non-invasive testing performed  Prior CABG: No previous CABG      History and Physical Interval Note:  04/18/2017 9:37 AM  Ana Salazar  has presented today for surgery, with the diagnosis of NSTEMI  The various methods of treatment have been discussed with the patient and family. After consideration of risks, benefits and other options for treatment, the patient has consented to  Procedure(s): LEFT HEART CATH AND CORONARY ANGIOGRAPHY (N/A) as a surgical intervention .  The patient's history has been reviewed, patient examined, no change in status, stable for surgery.  I have reviewed the patient's chart and labs.  Questions were answered to the patient's satisfaction.     Larae Grooms

## 2017-04-18 NOTE — Plan of Care (Signed)
Problem: Safety: Goal: Ability to remain free from injury will improve Outcome: Progressing Bed in low position, bed alarm on, non skid socks on, belongings and call bell in reach. Pt requires reinforcement to call for assistance.

## 2017-04-18 NOTE — H&P (View-Only) (Signed)
Cardiology Consult Note  Admit date: 04/17/2017 Name: Ana Salazar 71 y.o.  female DOB:  1946/04/03 MRN:  [id number]  Today's date:  04/17/2017  Referring Physician:    [facility]   Primary Physician:   Dr. Katherina Right  Reason for Consultation:   Arrhythmia, abnormal cardiac enzymes  IMPRESSIONS: 1.  Non-STEMI with abnormal troponins in the setting of supraventricular tachycardia 2.  Recurrent SVT following cessation of verapamil which was a chronic medicine 3.  Hypertensive heart disease poorly controlled 4.  History of aortic stenosis that is mild previously  5.  Parkinsonism with recent deep brain stimulator implant 6.  Frequent falling episodes  RECOMMENDATION: I would restart her verapamil.  In light of her abnormal troponins we'll recheck an EKG and plan cardiac catheterization to exclude significant coronary artery disease.  Repeat the echocardiogram. Cardiac catheterization was discussed with the patient fully including risks of myocardial infarction, death, stroke, bleeding, arrhythmia, dye allergy, renal insufficiency or bleeding.  The patient understands and is willing to proceed.  Possibility of intervention at the same time also discussed with patient and daughter they understand and are agreeable to proceed   HISTORY: This 71 year old female has a long-standing history of severe hypertension as well as SVT.  She has long-standing SVT since age 6.  I initially began seeing her in 2007 at which point in time her physicians would not refill her verapamil that had controlled her arrhythmia well through the years.  I had placed her on verapamil and we found her blood pressure to be quite difficult to control and she was intolerant to a number of medicines.  I eventually referred her to the hypertension clinic at [facility] but she somehow became lost to follow-up there and her blood pressure has not been very well controlled over the years.  She also has a systolic  murmur and in 3300 had an echocardiogram showing trivial aortic stenosis.  She recently and has significant parkinsonism with a significant movement disorder with chart out get dyskinesias and earlier this year underwent a deep brain stimulator implant at [facility].  I have seen her since then with frequent falling episodes and it has been difficult to determine whether or not this is been related to blood pressure or to adjustment of her stimulator.  At the first of the month a center that she was seeing stopped the verapamil that had previously well-controlled her SVT and placed her on diltiazem.  She stated that her weakness got better after that but then she presented to the [facility] with pressure in her chest and shortness of breath and was found to be in rapid SVT.  There is a mention in the chart of atrial fibrillation although no strips document this.  Her troponins rose from 0.01-8.96 consistent with a non-STEMI and she is transferred here for further evaluation.  BNP level was 637.  Glucose was 186.  She is currently pain-free.  Past Medical History:  Diagnosis Date  . Anemia   . Anxiety    takes Klonopin as bedtime  . Asthma   . Depression    takes Citalopram daily  . Hypertension    takes Verapamil daily  . Hypothyroidism   . Overactive bladder   . Parkinson's disease ([place])    takes Sinemet daily  . Peripheral edema    take Lasix as needed  . Pneumonia   . Seasonal allergies   . Sleep apnea    tested in [place], 5-6 mths ago.  wears cpap. "sitting on 5"  .       Marland Kitchen Tachycardia       Past Surgical History:  Procedure Laterality Date  . ABDOMINAL HYSTERECTOMY    . CESAREAN SECTION    . fibroid tumor    . MINOR PLACEMENT OF FIDUCIAL N/A 11/22/2016   Procedure: Fiducial placement;  Surgeon: Erline Levine, MD;  Location: [place];  Service: Neurosurgery;  Laterality: N/A;  Fiducial placement  . PULSE GENERATOR IMPLANT Right 12/06/2016   Procedure: Unilateral PULSE  GENERATOR IMPLANT;  Surgeon: Erline Levine, MD;  Location: [place];  Service: Neurosurgery;  Laterality: Right;  Unilateral PULSE GENERATOR IMPLANT  . RADIOLOGY WITH ANESTHESIA N/A 09/13/2016   Procedure: MRI BRAIN WITH AND WITHOUT;  Surgeon: Medication Radiologist, MD;  Location: [place];  Service: Radiology;  Laterality: N/A;  . skin cancer removed left arm    . SUBTHALAMIC STIMULATOR INSERTION Bilateral 11/29/2016   Procedure: Bilateral Deep brain stimulator placement;  Surgeon: Erline Levine, MD;  Location: [place];  Service: Neurosurgery;  Laterality: Bilateral;  Bilateral Deep brain stimulator placement  . THYROIDECTOMY, PARTIAL    . TONSILLECTOMY    . TUBAL LIGATION      Allergies:  is allergic to amoxicillin; prednisone; amlodipine; levothyroxine; meloxicam; nebivolol; nsaids; and valsartan.   Medications: Prior to Admission medications   Medication Sig Start Date End Date Taking? Authorizing Provider  albuterol (PROVENTIL HFA;VENTOLIN HFA) 108 (90 Base) MCG/ACT inhaler Inhale 1 puff into the lungs every 6 (six) hours as needed for wheezing or shortness of breath.   Yes [provider]  carbidopa-levodopa (SINEMET CR) 50-200 MG tablet Take 1 tablet by mouth at bedtime. 09/20/16  Yes Tat, Eustace Quail, DO  carbidopa-levodopa (SINEMET IR) 25-100 MG tablet 2 tablets at 7 AM/2 tablet at 10 AM//2 tablets at 1 PM/1 tablet at 4 PM and one tablet at 7 PM Patient taking differently: Take 1-2 tablets by mouth See admin instructions. Pt takes 2 tablets at 0700, 1000, 1300 - pt takes 1 tablet at 1600 07/25/16  Yes Tat, Eustace Quail, DO  citalopram (CELEXA) 20 MG tablet Take 10 mg by mouth daily.    Yes [provider]  Fluticasone Furoate (ARNUITY ELLIPTA) 100 MCG/ACT AEPB Inhale 1 puff into the lungs daily.    Yes [provider]  furosemide (LASIX) 20 MG tablet TAKE 1/2 TABLET DAILY AS NEEDED FOR SIGNIFICANT FLUID SWELLING ONLY 05/24/16  Yes [provider]  potassium chloride  (K-DUR,KLOR-CON) 10 MEQ tablet Take 10 mEq by mouth daily.    Yes [provider]  CRANBERRY PO Take 1 tablet by mouth daily.     [provider]  docusate sodium (COLACE) 100 MG capsule Take 100 mg by mouth daily as needed for mild constipation.     [provider]  Elastic Bandages & Supports (ABDOMINAL BINDER/ELASTIC MED) MISC 1 Device by Does not apply route daily. 02/03/17   Tat, Eustace Quail, DO  pantoprazole (PROTONIX) 40 MG tablet Take 40 mg by mouth every morning. 03/22/17   [provider]  verapamil (CALAN) 80 MG tablet Take 80 mg by mouth 2 (two) times daily.     [provider]    Family History: Family Status  Relation Status  . Mother Deceased       heart disease, DM, complications of fall  . Father Deceased       MI  . Sister Deceased       DM, CAD, renal failure  .  Sister Alive       healthy  . Child Alive       healthy    Social History:   reports that she quit smoking about 4 years ago. She has never used smokeless tobacco. She reports that she does not drink alcohol or use drugs.   Review of Systems: She has had depression in the past and also has significant psychosocial stress in the past.  She has some moderate shortness of breath and mild edema.  She also has significant difficulty with tremor still although the turned out dyskinesias have improved since her deep brain stimulation.  She has some nausea in the past.  She has pain in her ankles and also has dizziness as well as frequent falling episodes.  She has partial hearing loss and has a 20 pound weight loss over the past spring.  Physical Exam: BP (!) 180/92   Pulse 66   Temp 98 F (36.7 C) (Oral)   Resp 19   Ht 5\' 3"  (1.6 m)   Wt 75.9 kg (167 lb 4.8 oz)   SpO2 97%   BMI 29.64 kg/m   General appearance: She is a pleasant mildly obese female in no acute distress Head: Normocephalic, without obvious abnormality, atraumatic Eyes: conjunctivae/corneas clear.  PERRL, EOM's intact. Fundi Not examined Neck: no adenopathy, no carotid bruit, no JVD and supple, symmetrical, trachea midline Lungs: clear to auscultation bilaterally Heart: Regular rhythm, normal S1 and S2, no S3, 2/6 systolic murmur at aortic area Abdomen: soft, non-tender; bowel sounds normal; no masses,  no organomegaly Pelvic: deferred Extremities: extremities normal, atraumatic, no cyanosis or edema Pulses: 2+ and symmetric Skin: Skin color, texture, turgor normal. No rashes or lesions Neurologic: Mild resting tremor, gait not formally tested Psych: Alert and oriented x 3 Labs:   Radiology:  Chest x-ray report reveals cardiomegaly  EKG: Reviewed from [facility] shows supraventricular tachycardia Independently reviewed by me  Signed:  W. Doristine Church MD [facility]   Cardiology Consultant  04/17/2017, 7:40 PM

## 2017-04-18 NOTE — Progress Notes (Signed)
ANTICOAGULATION CONSULT NOTE - Initial Consult  Pharmacy Consult for heparin  Indication: chest pain/ACS  Allergies  Allergen Reactions  . Amoxicillin Anaphylaxis, Swelling and Rash    Has patient had a PCN reaction causing immediate rash, facial/tongue/throat swelling, SOB or lightheadedness with hypotension: Yes PCN reaction causing SEVERE RASH INVOLVING MUCUS MEMBRANES or SKIN NECROSIS: Yes Has patient had a PCN reaction that required hospitalization: No Has patient had a PCN reaction occurring within the last 10 years: Yes   . Prednisone Shortness Of Breath and Nausea Only    Hypertension   . Amlodipine Itching and Other (See Comments)    BURNING  . Levothyroxine Other (See Comments)    Tired, shaky, tremors  . Meloxicam Swelling    "Burning "  . Nebivolol Rash  . Nsaids Nausea And Vomiting  . Valsartan Itching    Patient Measurements: Height: 5\' 3"  (160 cm) Weight: 167 lb 4.8 oz (75.9 kg) IBW/kg (Calculated) : 52.4  Heparin dosing weight: 68.5 kg  Vital Signs: Temp: 98 F (36.7 C) (08/13 2347) Temp Source: Oral (08/13 2347) BP: 164/74 (08/13 2347) Pulse Rate: 60 (08/13 2347)  Labs:  Recent Labs  04/17/17 1930 04/18/17 0046  HEPARINUNFRC  --  0.34  TROPONINI 5.91*  --     CrCl cannot be calculated (Patient's most recent lab result is older than the maximum 21 days allowed.).   Medical History: Past Medical History:  Diagnosis Date  . Anemia   . Anxiety    takes Klonopin as bedtime  . Asthma   . CHF (congestive heart failure) ([place])   . Depression    takes Citalopram daily  . Gout   . History of blood transfusion    "w/hysterectomy" (04/17/2017)  . Hypertension    takes Verapamil daily  . Hypothyroidism   . NSTEMI (non-ST elevated myocardial infarction) ([place]) 04/17/2017  . OSA on CPAP     "sitting on 5" (04/17/2017)  . Overactive bladder   . Parkinson's disease ([place])    takes Sinemet daily  . Peripheral edema    take Lasix as needed  .  Pneumonia    "when I was a kid" (04/17/2017)  . Seasonal allergies   . Skin cancer    left elbow" (04/17/2017)  . Stroke [facility])    "weaker on my right side since; he last one I know of was in 2014" (04/17/2017)  . Tachycardia   . Urinary incontinence    Assessment: 24 YOF transferred from [place] with NSTEMI; pharmacy consulted to dose IV heparin. Per [facility] chart record, pt received lovenox 75 mg 8/13 AM.   Initial heparin level is therapeutic at 0.34, however, it was drawn ~4 hrs after the infusion was started. No overt s/s bleeding per notes.   Goal of Therapy:  Heparin level 0.3-0.7 units/ml Monitor platelets by anticoagulation protocol: Yes   Plan:  Continue heparin gtt at 850 units/hr F/u AM heparin level and CBC Daily heparin level and CBC Monitor for s/s bleeding F/u cardiology plans  Argie Ramming, PharmD Clinical Pharmacist 04/18/17 1:21 AM

## 2017-04-18 NOTE — Progress Notes (Signed)
Subjective:  She tolerated her catheterization well earlier today.  No recurrent chest pain and no recurrent SVT.  Objective:  Vital Signs in the last 24 hours: BP (!) 155/68 (BP Location: Left Arm)   Pulse (!) 50   Temp 98.2 F (36.8 C) (Oral)   Resp 19   Ht 5\' 3"  (1.6 m)   Wt 75.3 kg (166 lb 0.1 oz)   SpO2 95%   BMI 29.41 kg/m   Physical Exam: Pleasant obese white female in no acute distress Lungs:  Clear  Cardiac:  Regular rhythm, normal S1 and S2, no S3, 1/6 systolic murmur Abdomen:  Soft, nontender, no masses Extremities:  No edema present  Intake/Output from previous day: 08/13 0701 - 08/14 0700 In: 387.4 [P.O.:240; I.V.:147.4] Out: 800 [Urine:800] Weight Filed Weights   04/17/17 1704 04/18/17 0359  Weight: 75.9 kg (167 lb 4.8 oz) 75.3 kg (166 lb 0.1 oz)    Lab Results: Basic Metabolic Panel:  Recent Labs  04/18/17 0659  NA 139  K 3.4*  CL 107  CO2 26  GLUCOSE 95  BUN 13  CREATININE 0.74    CBC:  Recent Labs  04/18/17 0659  WBC 7.5  HGB 12.8  HCT 38.7  MCV 90.2  PLT 169    Cardiac Panel (last 3 results)  Recent Labs  04/17/17 1930 04/18/17 0046 04/18/17 0659  TROPONINI 5.91* 3.35* 2.81*    PROTIME: Lab Results  Component Value Date   INR 1.07 04/18/2017    Telemetry: Sinus rhythm and sinus bradycardia  Assessment/Plan:  1.  Non-STEMI precipitating by supraventricular tachycardia 2.  Coronary artery disease with chronic occlusion of the second marginal branch and mild to moderate disease elsewhere 3.  Obesity 4.  Hypertensive heart disease not well controlled 5.  Parkinsonism and previous movement disorder with previous deep brain stimulation 6.  Depression  Recommendations:  This whole episode was precipitating by SVT when her medicines were changed.  She has been well controlled previously on verapamil and this has been a chronic problem dating back many years.  I would suggest that she be restarted on verapamil and that  her blood pressure also be controlled with hydralazine.  From a cardiac viewpoint I think she can be discharged tomorrow.     Kerry Hough  MD [facility] Cardiology  04/18/2017, 5:37 PM

## 2017-04-18 NOTE — Progress Notes (Signed)
PROGRESS NOTE [facility]   Ana Salazar   QXI:[id number] DOB: 1945-12-24  DOA: 04/17/2017 PCP: System, Pcp Not In   Brief Narrative:  Ana Salazar is a 71 year old female with medical history of hypertension, SVT, asthma and Parkinson diseases, presented to [facility] with palpitations and chest pain. Upon ER evaluation she was found to be in A. fib with RVR she was started on Cardizem drip and subsequently converted to sinus rhythm. She was noted to have elevated troponin up to 2.9 and case was discussed with primary cardiologist Dr. Wynonia Lawman will recommend patient to be transferred to [facility] for further workup. Patient's troponin trended up to 5.9 and cardiology recommended to perform cardiac cath.   Subjective: Patient seen and examined, she continues to c/o SOB, no chest pain at this time.   Assessment & Plan: NSTEMI  Abnormal troponin in setting of VT No chest pain but SOB persist  Going to cardiac cath today  Continue tele Continue management per card recommendations  NGT PRN    A fib with RVR - HR well controlled now  New onset, back to SR  Heparin ggt for now  Continue Cardizem, metoprolol  HTN  Initially uncontrolled  On multiple BP meds Dilt, Lisinopril, Metoprolol and hydralazine PRN  Allergic to ARB's  Monitor BP   Parkinson's  Continue home medication Sinimet IF 0.5 - 1 tab  No longer on entacapone   COPD  Stable  Continue Albuterol   DVT prophylaxis: Home  Code Status: FULL  Family Communication: Daughter  Disposition Plan: Home when medically stable   Consultants:   Cardiology   Procedures:   Cardiac cath 04/18/17  Antimicrobials: Anti-infectives    None       Objective: Vitals:   04/17/17 2148 04/17/17 2347 04/18/17 0359 04/18/17 0829  BP: (!) 179/83 (!) 164/74 (!) 145/65 (!) 171/83  Pulse:  60 60 (!) 52  Resp:  20 15 17   Temp:  98 F (36.7 C) 98.4 F (36.9 C)   TempSrc:  Oral Oral   SpO2:  98% 96% 96%  Weight:    75.3 kg (166 lb 0.1 oz)   Height:        Intake/Output Summary (Last 24 hours) at 04/18/17 0934 Last data filed at 04/18/17 0819  Gross per 24 hour  Intake           387.42 ml  Output              900 ml  Net          -512.58 ml   Filed Weights   04/17/17 1704 04/18/17 0359  Weight: 75.9 kg (167 lb 4.8 oz) 75.3 kg (166 lb 0.1 oz)    Examination:  General exam: NAD  HEENT: OP moist and clear Respiratory system: Breath sound diminished b/l, mild crackles bilaterally  Cardiovascular system: S1 & S2 heard, RRR. No JVD, murmurs, rubs or gallops Gastrointestinal system: Abdomen is nondistended, soft and nontender.  Central nervous system: Alert and oriented. Extremities: trace LE edema.  Skin: No rashes, lesions or ulcers Psychiatry: Judgement and insight appear normal. Mood & affect appropriate.    Data Reviewed: I have personally reviewed following labs and imaging studies  CBC:  Recent Labs Lab 04/18/17 0659  WBC 7.5  HGB 12.8  HCT 38.7  MCV 90.2  PLT 034   Basic Metabolic Panel:  Recent Labs Lab 04/18/17 0659  NA 139  K 3.4*  CL 107  CO2 26  GLUCOSE 95  BUN 13  CREATININE 0.74  CALCIUM 9.2   GFR: Estimated Creatinine Clearance: 65.5 mL/min (by C-G formula based on SCr of 0.74 mg/dL). Liver Function Tests: No results for input(s): AST, ALT, ALKPHOS, BILITOT, PROT, ALBUMIN in the last 168 hours. No results for input(s): LIPASE, AMYLASE in the last 168 hours. No results for input(s): AMMONIA in the last 168 hours. Coagulation Profile:  Recent Labs Lab 04/18/17 0539  INR 1.07   Cardiac Enzymes:  Recent Labs Lab 04/17/17 1930 04/18/17 0046 04/18/17 0659  TROPONINI 5.91* 3.35* 2.81*   BNP (last 3 results) No results for input(s): PROBNP in the last 8760 hours. HbA1C: No results for input(s): HGBA1C in the last 72 hours. CBG: No results for input(s): GLUCAP in the last 168 hours. Lipid Profile:  Recent Labs  04/18/17 0046  CHOL 158  HDL  38*  LDLCALC 101*  TRIG 95  CHOLHDL 4.2   Thyroid Function Tests:  Recent Labs  04/17/17 1930 04/18/17 0832  TSH 5.213*  --   FREET4  --  1.07   Anemia Panel: No results for input(s): VITAMINB12, FOLATE, FERRITIN, TIBC, IRON, RETICCTPCT in the last 72 hours. Sepsis Labs: No results for input(s): PROCALCITON, LATICACIDVEN in the last 168 hours.  No results found for this or any previous visit (from the past 240 hour(s)).    Radiology Studies: No results found.   Scheduled Meds: . atorvastatin  40 mg Oral q1800  . budesonide  0.25 mg Nebulization BID  . carbidopa-levodopa  1 tablet Oral 2 times per day  . carbidopa-levodopa  2 tablet Oral 4 times per day  . citalopram  10 mg Oral Daily  . diltiazem  60 mg Oral Q12H  . hydrALAZINE  25 mg Oral QID  . lisinopril  20 mg Oral Daily  . metoprolol tartrate  25 mg Oral BID  . pantoprazole  40 mg Oral q morning - 10a  . potassium chloride  40 mEq Oral Once  . sodium chloride flush  3 mL Intravenous Q12H   Continuous Infusions: . sodium chloride    . sodium chloride 75 mL/hr at 04/18/17 0606  . heparin 850 Units/hr (04/17/17 2110)     LOS: 1 day    Time spent: Total of 15 minutes spent with pt, greater than 50% of which was spent in discussion of  treatment, counseling and coordination of care   Chipper Oman, MD Pager: Text Page via [email or web address]  [phone]  If 7PM-7AM, please contact night-coverage [email or web address] Password [facility] 04/18/2017, 9:34 AM

## 2017-04-19 DIAGNOSIS — I471 Supraventricular tachycardia: Secondary | ICD-10-CM | POA: Diagnosis present

## 2017-04-19 LAB — BASIC METABOLIC PANEL
Anion gap: 8 (ref 5–15)
BUN: 16 mg/dL (ref 6–20)
CHLORIDE: 107 mmol/L (ref 101–111)
CO2: 25 mmol/L (ref 22–32)
CREATININE: 0.74 mg/dL (ref 0.44–1.00)
Calcium: 9.4 mg/dL (ref 8.9–10.3)
GFR calc Af Amer: >60 mL/min (ref >60)
GLUCOSE: 84 mg/dL (ref 65–99)
POTASSIUM: 3.4 mmol/L — AB (ref 3.5–5.1)
SODIUM: 140 mmol/L (ref 135–145)

## 2017-04-19 LAB — HEMOGLOBIN A1C
HEMOGLOBIN A1C: 5.1 % (ref 4.8–5.6)
MEAN PLASMA GLUCOSE: 100 mg/dL

## 2017-04-19 LAB — CBC
HCT: 35.7 % — ABNORMAL LOW (ref 36.0–46.0)
Hemoglobin: 11.9 g/dL — ABNORMAL LOW (ref 12.0–15.0)
MCH: 30.2 pg (ref 26.0–34.0)
MCHC: 33.3 g/dL (ref 30.0–36.0)
MCV: 90.6 fL (ref 78.0–100.0)
PLATELETS: 168 10*3/uL (ref 150–400)
RBC: 3.94 MIL/uL (ref 3.87–5.11)
RDW: 13.6 % (ref 11.5–15.5)
WBC: 6.2 10*3/uL (ref 4.0–10.5)

## 2017-04-19 MED ORDER — LISINOPRIL 20 MG PO TABS: 20.0000 mg | ORAL_TABLET | Freq: Two times a day (BID) | ORAL | Status: DC

## 2017-04-19 MED ORDER — LISINOPRIL 20 MG PO TABS
20.0000 mg | ORAL_TABLET | Freq: Two times a day (BID) | ORAL | Status: DC
  Administered 2017-04-19: 20 mg via ORAL

## 2017-04-19 MED ORDER — LISINOPRIL 20 MG PO TABS: 20.0000 mg | ORAL_TABLET | Freq: Two times a day (BID) | ORAL | 0 refills | Status: DC

## 2017-04-19 MED ORDER — HYDRALAZINE HCL 50 MG PO TABS: 50.0000 mg | ORAL_TABLET | Freq: Three times a day (TID) | ORAL | 0 refills | Status: DC

## 2017-04-19 MED ORDER — ATORVASTATIN CALCIUM 40 MG PO TABS: 40.0000 mg | ORAL_TABLET | Freq: Every day | ORAL | 0 refills | Status: AC

## 2017-04-19 MED ORDER — FUROSEMIDE 20 MG PO TABS
20.0000 mg | ORAL_TABLET | Freq: Every day | ORAL | Status: DC
  Administered 2017-04-19: 20 mg via ORAL

## 2017-04-19 MED ORDER — FUROSEMIDE 20 MG PO TABS: 20.0000 mg | ORAL_TABLET | Freq: Every day | ORAL | 0 refills | Status: DC

## 2017-04-19 MED ORDER — VERAPAMIL HCL 80 MG PO TABS: 80.0000 mg | ORAL_TABLET | Freq: Two times a day (BID) | ORAL | 0 refills | Status: DC

## 2017-04-19 MED FILL — Nitroglycerin IV Soln 100 MCG/ML in D5W: INTRA_ARTERIAL | Qty: 10 | Status: AC

## 2017-04-19 NOTE — Progress Notes (Signed)
Subjective:  Patient feels better today.  Blood pressure is still high but starting to come down.  Her hypertension has been quite difficult to control through the years.  Having some nocturnal bradycardia.  Objective:  Vital Signs in the last 24 hours: BP (!) 154/69   Pulse (!) 53   Temp 98 F (36.7 C) (Oral)   Resp 15   Ht 5\' 3"  (1.6 m)   Wt 75.2 kg (165 lb 12.8 oz)   SpO2 95%   BMI 29.37 kg/m   Physical Exam: Pleasant obese white female in no acute distress Lungs:  Clear  Cardiac:  Regular rhythm, normal S1 and S2, no S3, 1/6 systolic murmur Abdomen:  Soft, nontender, no masses Extremities:  Radial catheterization site with mild ecchymoses but stable.  Intake/Output from previous day: 08/14 0701 - 08/15 0700 In: 465 [P.O.:240; I.V.:225] Out: 350 [Urine:350] Weight Filed Weights   04/17/17 1704 04/18/17 0359 04/19/17 0454  Weight: 75.9 kg (167 lb 4.8 oz) 75.3 kg (166 lb 0.1 oz) 75.2 kg (165 lb 12.8 oz)    Lab Results: Basic Metabolic Panel:  Recent Labs  04/18/17 0659 04/19/17 0430  NA 139 140  K 3.4* 3.4*  CL 107 107  CO2 26 25  GLUCOSE 95 84  BUN 13 16  CREATININE 0.74 0.74    CBC:  Recent Labs  04/18/17 0659 04/19/17 0430  WBC 7.5 6.2  HGB 12.8 11.9*  HCT 38.7 35.7*  MCV 90.2 90.6  PLT 169 168    Cardiac Panel (last 3 results)  Recent Labs  04/17/17 1930 04/18/17 0046 04/18/17 0659  TROPONINI 5.91* 3.35* 2.81*   Telemetry: Sinus rhythm and sinus bradycardia especially at night   Assessment/Plan:  1.  Non-STEMI precipitated by supraventricular tachycardia 2.  Coronary artery disease with chronic occlusion of the second marginal branch and mild to moderate disease elsewhere 3.  Obesity 4.  Hypertensive heart disease not well controlled 5.  Parkinsonism and previous movement disorder with previous deep brain stimulation 6.  Depression  Recommendations:  She is having bradycardia at night.  In the past verapamil has been the thing  that has been most effective to control her SVT.  I would discontinue her metoprolol and add low-dose furosemide.  In the past we have tried spironolactone and it might be helpful to try this again particularly if she is hypokalemic.  From a cardiac viewpoint may be discharged home today with outpatient physical therapy.      Kerry Hough  MD [facility] Cardiology  04/19/2017, 8:36 AM

## 2017-04-19 NOTE — Care Management Note (Signed)
Case Management Note  Patient Details  Name: Ana Salazar MRN: [id number] Date of Birth: [birth year]  Subjective/Objective: Pt presented for Nstemi as a transfer from [facility].                    Action/Plan: Pt uses [facility]- she goes daily from 8:30 -4:40 pm. Pt gets Physical Therapy onsite and medications are provide to patient. CM did fax information to [facility]. No further needs from CM at this time.   Expected Discharge Date:  04/19/17               Expected Discharge Plan:  Home/Self Care (Adult Day Care([facility] [place] [facility]))  In-House Referral:  NA  Discharge planning Services  CM Consult  Post Acute Care Choice:  NA Choice offered to:  NA  DME Arranged:  N/A DME Agency:  NA  HH Arranged:  NA HH Agency:  NA  Status of Service:  Completed, signed off  If discussed at [place] of Stay Meetings, dates discussed:    Additional Comments:  Bethena Roys, RN 04/19/2017, 12:12 PM

## 2017-04-19 NOTE — Discharge Summary (Signed)
Physician Discharge Summary  Ana Salazar XLK:[id number] DOB: May 13, 1946 DOA: 04/17/2017  PCP: System, Pcp Not In  Admit date: 04/17/2017 Discharge date: 04/19/2017  Time spent: 40 minutes  Recommendations for Outpatient Follow-up:  1. New meds-Atorvastatin 40, Hydralazine 50 tid, Verapamil 80 bid, Lasix 20 od, Lisinopril changed to 10 bid 2. See neurology as an OP 3. Physical therapy to follow as OP 4. Get bmet 1 week   Discharge Diagnoses:  Principal Problem:   NSTEMI (non-ST elevated myocardial infarction) [facility]) Active Problems:   Parkinson's disease ([place])   Uncontrolled hypertension   CAD (coronary artery disease), native coronary artery   Hypertensive heart disease without CHF   Obesity (BMI 30-39.9)   Paroxysmal SVT (supraventricular tachycardia) (HCC)   Discharge Condition: improved  Diet recommendation: hh low salt  Filed Weights   04/17/17 1704 04/18/17 0359 04/19/17 0454  Weight: 75.9 kg (167 lb 4.8 oz) 75.3 kg (166 lb 0.1 oz) 75.2 kg (165 lb 12.8 oz)    History of present illness:  59 fem  H/o SVT Htn 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 Carles Collet  Admit 8/13 with SVT and CHF   Hospital Course:  SVT NICM 2/2 Tachycardia Started on Cardizem GTT--converted to NSR troponins were ? Cardiac cath 8/14  =Conclusion     2nd Mrg lesion, 100 %stenosed. 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  Ost 2nd Diag to 2nd Diag lesion, 50 %stenosed.  Mid LAD lesion, 50 %stenosed.  Prox Cx to Mid Cx lesion, 25 %stenosed.  Prox RCA to Mid RCA lesion, 75 %stenosed. Nondominant vessel.  The left ventricular systolic function is normal.  LV end diastolic pressure is mildly elevated.  The left ventricular ejection fraction is 50-55% by visual estimate.  There is mild aortic valve stenosis.  Severe right radial vasospasm. Would consider femoral approach if cath needed in the future.   Patient only had angina while she had SVT. No further  angina since that time. No angina prior to coming to the hospital. I suspect she had ischemia with high heart rates due to the chronically occluded obtuse marginal.   Continue aggressive medical therapy.   Dr Wynonia Lawman adjusted medications and suggests addition of Aldactone probably as OP--He will see her in 2 weeks and make this detemrination From my perspective she doesn't require any AC see med changes as per below   Parkinsons Cont Sinemet IR and ER Will need OP follow up with Dr. Carles Collet  COPd stable during [place] stay   Discharge Exam: Vitals:   04/19/17 0754 04/19/17 0830  BP:  (!) 154/69  Pulse:    Resp:    Temp:    SpO2: 95%     General: Alert pleasant oriented in good spirits Cardiovascular: S1-S2 no murmur Respiratory: Clinically clear no added sound  Discharge Instructions    Discharge Medication List as of 04/19/2017 12:46 PM    START taking these medications   Details  atorvastatin (LIPITOR) 40 MG tablet Take 1 tablet (40 mg total) by mouth daily at 6 PM., Starting Wed 04/19/2017, Print    hydrALAZINE (APRESOLINE) 50 MG tablet Take 1 tablet (50 mg total) by mouth every 8 (eight) hours., Starting Wed 04/19/2017, Print    verapamil (CALAN) 80 MG tablet Take 1 tablet (80 mg total) by mouth every 12 (twelve) hours., Starting Wed 04/19/2017, Print      CONTINUE these medications which have CHANGED   Details  furosemide (LASIX) 20 MG tablet Take 1 tablet (20 mg  total) by mouth daily., Starting Wed 04/19/2017, Print    lisinopril (PRINIVIL,ZESTRIL) 20 MG tablet Take 1 tablet (20 mg total) by mouth 2 (two) times daily., Starting Wed 04/19/2017, Print      CONTINUE these medications which have NOT CHANGED   Details  albuterol (PROVENTIL HFA;VENTOLIN HFA) 108 (90 Base) MCG/ACT inhaler Inhale 1 puff into the lungs every 6 (six) hours as needed for wheezing or shortness of breath., Historical Med    carbidopa-levodopa (SINEMET CR) 50-200 MG tablet Take 1 tablet by mouth  at bedtime., Starting Tue 09/20/2016, Normal    carbidopa-levodopa (SINEMET IR) 25-100 MG tablet 2 tablets at 7 AM/2 tablet at 10 AM//2 tablets at 1 PM/1 tablet at 4 PM and one tablet at 7 PM, Normal    citalopram (CELEXA) 20 MG tablet Take 10 mg by mouth daily. , Historical Med    Elastic Bandages & Supports (ABDOMINAL BINDER/ELASTIC MED) MISC 1 Device by Does not apply route daily., Starting Fri 02/03/2017, Print    Fluticasone Furoate (ARNUITY ELLIPTA) 100 MCG/ACT AEPB Inhale 1 puff into the lungs daily. , Historical Med    pantoprazole (PROTONIX) 40 MG tablet Take 40 mg by mouth every morning., Starting Wed 03/22/2017, Historical Med    potassium chloride (K-DUR,KLOR-CON) 10 MEQ tablet Take 10 mEq by mouth daily. , Historical Med      STOP taking these medications     diltiazem (CARDIZEM) 60 MG tablet        Allergies  Allergen Reactions  . Amoxicillin Anaphylaxis, Swelling, Rash and Other (See Comments)    Has patient had a PCN reaction causing immediate rash, facial/tongue/throat swelling, SOB or lightheadedness with hypotension: Yes PCN reaction causing SEVERE RASH INVOLVING MUCUS MEMBRANES or SKIN NECROSIS: Yes Has patient had a PCN reaction that required hospitalization: No Has patient had a PCN reaction occurring within the last 10 years: Yes   . Prednisone Shortness Of Breath, Nausea Only and Other (See Comments)    Hypertension   . Amlodipine Itching and Other (See Comments)    BURNING  . Levothyroxine Other (See Comments)    Tired, shaky, tremors  . Meloxicam Swelling and Other (See Comments)    "Burning "  . Nebivolol Rash  . Nsaids Nausea And Vomiting  . Valsartan Itching      The results of significant diagnostics from this hospitalization (including imaging, microbiology, ancillary and laboratory) are listed below for reference.    Significant Diagnostic Studies: No results found.  Microbiology: No results found for this or any previous visit (from the  past 240 hour(s)).   Labs: Basic Metabolic Panel:  Recent Labs Lab 04/18/17 0659 04/19/17 0430  NA 139 140  K 3.4* 3.4*  CL 107 107  CO2 26 25  GLUCOSE 95 84  BUN 13 16  CREATININE 0.74 0.74  CALCIUM 9.2 9.4   Liver Function Tests: No results for input(s): AST, ALT, ALKPHOS, BILITOT, PROT, ALBUMIN in the last 168 hours. No results for input(s): LIPASE, AMYLASE in the last 168 hours. No results for input(s): AMMONIA in the last 168 hours. CBC:  Recent Labs Lab 04/18/17 0659 04/19/17 0430  WBC 7.5 6.2  HGB 12.8 11.9*  HCT 38.7 35.7*  MCV 90.2 90.6  PLT 169 168   Cardiac Enzymes:  Recent Labs Lab 04/17/17 1930 04/18/17 0046 04/18/17 0659  TROPONINI 5.91* 3.35* 2.81*   BNP: BNP (last 3 results) No results for input(s): BNP in the last 8760 hours.  ProBNP (last 3 results) No  results for input(s): PROBNP in the last 8760 hours.  CBG: No results for input(s): GLUCAP in the last 168 hours.     SignedNita Sells MD   [facility] 04/19/2017, 9:39 AM

## 2017-04-19 NOTE — Evaluation (Signed)
Physical Therapy Evaluation Patient Details Name: Ana Salazar MRN: [id number] DOB: [birth year] Today's Date: 04/19/2017   History of Present Illness  Pt is a 71 y/o female admitted secondary to NSTEMI. Pt is s/p L heart cath and coronary angiography. PMH includes parkinson's disease s/p deep brain stimulator insertion, CHF, anxiety, CVA, HTN, and current smoker.   Clinical Impression  Pt admitted secondary to problem above with deficits below. PTA, pt independent with mobility using RW. Upon eval, pt limited by decreased strength and balance. Pt requiring supervision to min guard for safety with mobility and required cues for safe use of DME during ambulation. Educated about assist required at home and pt states her sister and daughter are there to assist. Pt and daughter report pt was in Stay Well program which included outpatient PT services. Recommend continuation of outpatient PT services to address balance and safety with mobility as pt has had multiple falls at home. Will continue to follow to maximize functional mobility independence and safety.     Follow Up Recommendations Outpatient PT;Supervision/Assistance - 24 hour (Resumption of stay well services )    Equipment Recommendations  None recommended by PT (Has all DME at home )    Recommendations for Other Services       Precautions / Restrictions Precautions Precautions: Fall Precaution Comments: Per RN, pt has had multiple falls at home.  Restrictions Weight Bearing Restrictions: No      Mobility  Bed Mobility Overal bed mobility: Needs Assistance Bed Mobility: Supine to Sit;Sit to Supine     Supine to sit: Supervision Sit to supine: Supervision   General bed mobility comments: Supervision for safety. Safety cues required to wait for PT for mobility.   Transfers Overall transfer level: Needs assistance Equipment used: Rolling walker (2 wheeled) Transfers: Sit to/from Stand Sit to Stand: Supervision          General transfer comment: Supervision for safety. Verbal cues for safe hand placement.   Ambulation/Gait Ambulation/Gait assistance: Min guard;Supervision Ambulation Distance (Feet): 200 Feet Assistive device: Rolling walker (2 wheeled) Gait Pattern/deviations: Step-through pattern;Decreased stride length Gait velocity: Decreased Gait velocity interpretation: Below normal speed for age/gender General Gait Details: Slow, overall steady gait with use of RW. Required safety cues when pt performing turns for RW management, as pt was picking up RW as she was turning. Educated to take time during ambulation to increase safety at home. VSS  Stairs            Wheelchair Mobility    Modified Rankin (Stroke Patients Only)       Balance Overall balance assessment: Needs assistance Sitting-balance support: No upper extremity supported;Feet supported Sitting balance-Leahy Scale: Good     Standing balance support: Bilateral upper extremity supported;No upper extremity supported;During functional activity Standing balance-Leahy Scale: Fair Standing balance comment: Able to maintain static standing while performing toileting tasks without UE support.                              Pertinent Vitals/Pain Pain Assessment: No/denies pain    Home Living Family/patient expects to be discharged to:: Private residence Living Arrangements: Other relatives (sister ) Available Help at Discharge: Family;Available 24 hours/day Type of Home: House Home Access: Stairs to enter Entrance Stairs-Rails: Right Entrance Stairs-Number of Steps: 2 Home Layout: One level Home Equipment: Walker - 2 wheels;Cane - single point;Shower seat - built in;Wheelchair - manual Additional Comments: pt reports sister and daughter will be  with her 24/7    Prior Function Level of Independence: Independent with assistive device(s)         Comments: Used RW at baseline      Hand Dominance   Dominant  Hand: Right    Extremity/Trunk Assessment   Upper Extremity Assessment Upper Extremity Assessment: Overall WFL for tasks assessed    Lower Extremity Assessment Lower Extremity Assessment: Generalized weakness (Grossly 4-/5 throughout )    Cervical / Trunk Assessment Cervical / Trunk Assessment: Normal  Communication   Communication: No difficulties  Cognition Arousal/Alertness: Awake/alert Behavior During Therapy: WFL for tasks assessed/performed Overall Cognitive Status: Within Functional Limits for tasks assessed                                        General Comments General comments (skin integrity, edema, etc.): Pt's daughter present throughout session. Asked about current PT and reports pt is in Stay Well program where family reports she receives outpatient PT. Educated about resuming those services and pt agreeable. Educated about assist required at home and assist with stair management. Reviewed LE sequencing with pt and family and use of step to pattern to increase safety.     Exercises     Assessment/Plan    PT Assessment Patient needs continued PT services  PT Problem List Decreased strength;Decreased balance;Decreased mobility;Decreased safety awareness;Decreased knowledge of use of DME;Decreased knowledge of precautions;Pain       PT Treatment Interventions DME instruction;Gait training;Stair training;Functional mobility training;Therapeutic activities;Therapeutic exercise;Balance training;Neuromuscular re-education;Patient/family education    PT Goals (Current goals can be found in the Care Plan section)  Acute Rehab PT Goals Patient Stated Goal: to go home  PT Goal Formulation: With patient Time For Goal Achievement: 04/26/17 Potential to Achieve Goals: Good    Frequency Min 3X/week   Barriers to discharge        Co-evaluation               AM-PAC PT "6 Clicks" Daily Activity  Outcome Measure Difficulty turning over in bed  (including adjusting bedclothes, sheets and blankets)?: A Little Difficulty moving from lying on back to sitting on the side of the bed? : A Little Difficulty sitting down on and standing up from a chair with arms (e.g., wheelchair, bedside commode, etc,.)?: A Little Help needed moving to and from a bed to chair (including a wheelchair)?: A Little Help needed walking in hospital room?: A Little Help needed climbing 3-5 steps with a railing? : A Little 6 Click Score: 18    End of Session Equipment Utilized During Treatment: Gait belt Activity Tolerance: Patient tolerated treatment well Patient left: in bed;with call bell/phone within reach;with family/visitor present Nurse Communication: Mobility status PT Visit Diagnosis: Unsteadiness on feet (R26.81);Other abnormalities of gait and mobility (R26.89)    Time: 4034-7425 PT Time Calculation (min) (ACUTE ONLY): 20 min   Charges:   PT Evaluation $PT Eval Moderate Complexity: 1 Mod PT Treatments $Gait Training: 8-22 mins   PT G Codes:        Leighton Ruff, PT, DPT  Acute Rehabilitation Services  Pager: [phone]   Ana Salazar 04/19/2017, 11:58 AM

## 2017-04-19 NOTE — Progress Notes (Signed)
The patient and her daughter have been given discharge instructions along with a new medication list and what to take today. She has paper prescriptions and follow up appointments. She is discharging with daughter via car.   Saddie Benders RN

## 2017-04-27 NOTE — Addendum Note (Signed)
Addendum  created 04/27/17 1238 by Roberts Gaudy, MD   Sign clinical note

## 2017-04-27 NOTE — Addendum Note (Signed)
Addendum  created 04/27/17 1500 by Roberts Gaudy, MD   Sign clinical note

## 2017-05-02 ENCOUNTER — Ambulatory Visit: Payer: No Typology Code available for payment source | Admitting: Neurology

## 2017-05-04 NOTE — Telephone Encounter (Signed)
Error

## 2017-05-05 ENCOUNTER — Ambulatory Visit: Payer: No Typology Code available for payment source | Admitting: Neurology

## 2017-05-14 DIAGNOSIS — I2510 Atherosclerotic heart disease of native coronary artery without angina pectoris: Secondary | ICD-10-CM | POA: Diagnosis not present

## 2017-05-14 DIAGNOSIS — R079 Chest pain, unspecified: Secondary | ICD-10-CM

## 2017-05-14 DIAGNOSIS — G20 Parkinson's disease: Secondary | ICD-10-CM | POA: Diagnosis not present

## 2017-05-14 DIAGNOSIS — I214 Non-ST elevation (NSTEMI) myocardial infarction: Secondary | ICD-10-CM

## 2017-05-14 DIAGNOSIS — R0602 Shortness of breath: Secondary | ICD-10-CM

## 2017-05-14 DIAGNOSIS — R002 Palpitations: Secondary | ICD-10-CM

## 2017-05-14 DIAGNOSIS — I471 Supraventricular tachycardia: Secondary | ICD-10-CM

## 2017-05-14 DIAGNOSIS — I10 Essential (primary) hypertension: Secondary | ICD-10-CM

## 2017-05-15 DIAGNOSIS — I214 Non-ST elevation (NSTEMI) myocardial infarction: Secondary | ICD-10-CM | POA: Diagnosis not present

## 2017-05-15 DIAGNOSIS — R002 Palpitations: Secondary | ICD-10-CM | POA: Diagnosis not present

## 2017-05-15 DIAGNOSIS — I2510 Atherosclerotic heart disease of native coronary artery without angina pectoris: Secondary | ICD-10-CM | POA: Diagnosis not present

## 2017-05-15 DIAGNOSIS — I10 Essential (primary) hypertension: Secondary | ICD-10-CM | POA: Diagnosis not present

## 2017-05-16 DIAGNOSIS — I214 Non-ST elevation (NSTEMI) myocardial infarction: Secondary | ICD-10-CM | POA: Diagnosis not present

## 2017-05-16 DIAGNOSIS — R002 Palpitations: Secondary | ICD-10-CM | POA: Diagnosis not present

## 2017-05-16 DIAGNOSIS — I2510 Atherosclerotic heart disease of native coronary artery without angina pectoris: Secondary | ICD-10-CM | POA: Diagnosis not present

## 2017-05-16 DIAGNOSIS — I10 Essential (primary) hypertension: Secondary | ICD-10-CM | POA: Diagnosis not present

## 2017-05-18 NOTE — Progress Notes (Signed)
u   Ana Salazar was seen today in the movement disorders clinic for neurologic consultation at the request of System, Pcp Not In.   The patient presents today for a second opinion regarding Parkinson's disease. She is accompanied by her daughter and sister who supplement the history.   I have reviewed an extensive number of her records from her prior neurologist, [name], and I appreciate those records.  The patient presented to [name] first in November, 2014, but reported that she had had symptoms for about a year and a half prior to that.  Her first symptoms consisted of difficulty moving her feet/shuffling the feet; trouble getting out of the bed; tremor (she thinks that it started in both but does state that the right is worse than the left and always has been).  The patient was started on a trial of levodopa at her first visit with [name] and followed up with him the next month and noted great improvement with the medication.  She was started on pramipexole the following visit but noted that it made her dizzy and it was discontinued over the telephone.  The following visit, she was started on entacapone 200 mg 3 times a day.  In April, 2015 her carbidopa/levodopa 25/100 was increased to 2 tablets 3 times per day.  In July, 2016 she was changed to carbidopa/levodopa 50/200, 3 times per day.  In October, 2016 she was placed on a combination of carbidopa/levodopa 50/200, 3 times a day (4-5am, noon, 8pm) in addition to carbidopa/levodopa 25/100, 2 tablets 3 times per day (1200 mg of levodopa per day).  She remains on the entacapone, 200 mg 3 times per day as well as primidone, 50 mg - 1.5 tablets at night, which she is using for tremor control.  She reports that her biggest frustration is that she is always moving and is more dizzy and is starting to have a few falls.  When she is nearing end of dose (1-2 hours prior) she will have tremor in her stomach, followed by her legs and arms.    10/13/15  update:  The patient is following care today, primarily for levodopa challenge.  She is currently off of medication.  She is accompanied by her daughter (different daughter than last visit) who supplements the history.  She last took her medication at 8pm.  Her medication generally consists of carbidopa/levodopa 50/200 3 times per day in addition to carbidopa/levodopa 25/100, 1 tablet 6 times per day.  Splitting the medication did help somewhat, but she thinks that it is not helping as much as it does initially when she first started doing it that way.  She is also on entacapone 200 mg 3 times per day and primidone 75 mg at night.  She has not had any falls since our last visit.  01/12/16 update:  The patient is following up today, as she called me to ask me about potentially decreasing her levodopa. She is accompanied by her sister who supplements the history.   She is on a very large dosage and somewhat of a strange combination, but came to me on this combination.  She is currently taking carbidopa/levodopa 50/200 3 times a day (5am/noon/8pm) and is now spreading out her carbidopa/levodopa 25/100 to one tablet 6 times per day (5am is the start and she takes that q 3 hrs until 8 pm).  She remains on entacapone 200 mg 3 times a day and primidone 75 mg daily.  She goes to  bed at about 8pm.  She has found that she is having more dyskinesia.  She has been working with rehabilitation therapist and I have gotten a couple of correspondence is from them that they have been concerned about labored breathing.   I advised follow-up with her primary care physician if this was true shortness of breath.  She did say that she wore a holter monitor for a month and that eval was negative.  Was told if continues needs pulm.  Only SOB when she is hot or exerting herself.  He also mentioned that they were concerned about inability to track inferiorly.  She did see [name] for her neuropsych testing, done on 12/22/2015.  She got  feedback from them on 01/05/2016.  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  I specifically spoke with [name], and she did not feel there was evidence of an atypical state from her testing.  She felt that she could potentially be a good DBS candidate.  She did recommend a vision evaluation, medication to help with anxiety/sleep/irritability, and a follow-up regarding a history of sleep apnea.  Pt states that she was already to the eye doctor in December at [facility].    02/02/16 update:  The patient is following up today, earlier than expected.  She is accompanied by her sister who supplements the history.  Last visit I changed her daytime extended release levodopa all over 2 immediate release.  Therefore, she was taking carbidopa/levodopa 25/100 as follows: 2 at 5am (she is waking up to take that pill and goes to bed until 7am/1 at 8am/1 at 11 am//2 at 2 pm/1 at 5pm and then would take an additional levodopa/levodopa 50/200 at 8pm.  This dropped her overall load of levodopa from 1200 mg to 900 mg, primarily because she was complaining about dyskinesia.  She called me on May 22 and stated that she was doing well initially, but now feels more weak and stiff.  When I asked her specifically, she admitted that she was being treated for urinary tract infection.  I told her to give that some time to resolve, because that can worsen the symptoms of Parkinson's.  She called the following day and requested a follow-up appointment here, which is the reason for follow-up.  She states that she is weaker, which means that the legs are "tired."  She denies freezing.  She states that overall she is more tired.  No falls.  She states that the sx's do not wax and wane throughout the day.  Her sister thinks that she is doing better but sister does state that she is "starting to get that shuffle."  Last PT was in mid April.  C/o nose dripping for years.    05/04/16 update:  The patient follows up today,  accompanied by her sister who supplements the history.  Last visit, the patient wanted to decrease her levodopa load, primarily because of dyskinesia.  We ended up reworking it significantly.  I changed her from the CR formulation to the immediate release formulation and did drop the overall load somewhat.  She is currently taking carbidopa/levodopa 25/100, 2 tablets at 7 AM/2 tablet at 10 AM/2 tablets at 1 PM/1 tablet at 4 PM/carbidopa/levodopa 50/200 at bedtime (8PM).  She cut out the 7pm dosage.    She takes entacapone 3 times per day, which turned out to be every other dose of levodopa.  She is still on primidone, but only on 50 mg  daily, which she was on prior to coming to see me.  She initally states that she isn't better but then states that dyskinesia is "a lot better than it was" which was the primary issue for changing.   She has had several falls that she attributes to looking down when she walks or getting dizzy when she first gets up.  Was able to get off of clonidine and losartan per PCP because of dizziness and that helped dizziness.  She hasn't gotten hurt with falls.   The big issue now is trouble sleeping.  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  She states that she was only using one time per day.  She also told me last visit that she was getting ready to have a sleep study for objective sleep apnea syndrome and a pulmonary consult for shortness of breath.  I do not have that information but she tells me that she does have osas and needs cpap titration and is awaiting that.  Denies depression but daughter thinks that she has it.  Pt doesn't have much to do during the day.  06/06/16 update: The patient follows up today, accompanied by her sister and daughter who supplement the history.  She is currently taking carbidopa/levodopa 25/100, 2 tablets at 7 AM/2 tablet at 10 AM/2 tablets at 1 PM/1 tablet at 4  PM/carbidopa/levodopa 50/200 at bedtime (8PM).  She is on entacapone 200 mg 3 times per day.  States that "my legs are really tired." Thinks that this is mostly in middle of the day/early AM and then "I spend the entire day trying to catch up."   Finds that if she takes an extra carbidopa/levodopa 25/100 in the middle of the night she does better.  Last visit, I discontinued her primidone, 50 mg daily.  She was complaining about vasomotor rhinorrhea last visit, and I told her to increase her atropine drops to 3 times a day as needed.  She states that she hasn't do that.  I started remeron last visit for depression and sleep.  Is still on celexa.  Remeron definitely helped sleep but got CPAP since our last visit as well.  Sister states that she is using that and is now screaming and hollaring and hitting the walls at night, but seems this was going on before CPAP.    07/21/16 update:  Pt returns again for levodopa challenge test.  2 of her daughters accompany her and supplement the history.  She is on carbidopa/levodopa 25/100, 2 tablets at 7 AM/2 tablet at 10 AM//2 tablets at 1 PM/1 tablet at 4 PM and then she will decide if she needs her last one tablet at 7 PM.  She also takes carbidopa/levodopa  50/200 at bedtime, which is 8 PM.  She is on comtan 200 mg tid.  She has been off of all PD med for about 60 hrs for this test.  She does feel more slowly and stiff, although she does feel less dizzy getting off of the medication.  10/25/16 update:  Pt returns today for pre-op visit.  She is accompanied by her daughter who supplements the history.  She is scheduled for DBS surgery next month.  She is on carbidopa/levodopa 25/100, 2/2/2/1 and sometimes will take another in middle of the night.  She takes carbidopa/levodopa 50/200 at bedtime.  She takes entacapone with 3 of the daytime dosages of levodopa.  She has had  some falls because not using her walker, often at night per daughter.  She is loving [employer].  Patient seen today in follow-up.  01/02/17 update:  Patient seen today in follow-up.  She is accompanied by her daughters who supplement the history.  She is status post bilateral STN DBS on 11/29/2016.  She had her generator placed on 12/07/2016.  She was in the hospital between the 2 surgeries with complaints of falls.  I had talked to Dr. Vertell Limber.  The patient had refused to go to subacute nursing facility for rehabilitation.  She had apparently also been refusing to use her walker.  We did get home health involved.  She has had a few falls since home health.  She fell on her butt this weekend and "it was the dogs fault."  Few other falls related to letting go of the walker.  Patient does think some of the falls were related to dizziness.  Her primary care physician has just cut back on her verapamil.  Some of issues that were being reported were due to family stress between patient and her sister.  She is now back on carbidopa/levodopa 25/100, 2/2/2/1 and entacapone with 3 of the dosages.  She is on carbidopa/levodopa 50/200 at bedtime.  She last took her PD med at [place] has not been great.  Family having difficulty understanding her.  Hasn't been doing ST with home health.  01/17/17 update:  Patient seen today in follow-up.  I activated her device on 01/02/2017.  She is currently on carbidopa/levodopa 25/100, 2 tablets at 7, 1 tablet at 10, 1 tablet at 1pm, 1 tablet at 4pm and she has d/c the carbidopa/levodopa 50/200 at night.  I discontinued her entacapone last visit.  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  She has been having some dyskinesia mostly with the L leg.  She is not stable and has had some falls in the house.  She has never had a fall with the walker but has had a fall without the walker.  Speech therapy just started last week.    02/03/17 update:  Patient seen today in follow-up.  Last visit, I decreased her medication and told her to  take carbidopa/levodopa 25/100, 1 tablets at 7am, 0.5 tablets at 11am, 3pm, 7pm.  While dyskinesia is better, she has had multiple falls.  With all of the falls, she has let go of the walker for some reason (getting out of shower, making a sandwich).  "I'm taking the walker more than I did."  She has never fallen when actually holding on to the walker.  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  05/23/17 update: patient seen today in follow-up for her Parkinson's disease.  She is accompanied by her daughter and sister who supplements the history.  Much has happened since our last visit.  I have reviewed hospital records made available to me.  She was admitted to the hospital on 04/17/2017 and discharged on 04/19/2017 with NSTEMI.  Hospital discharge records indicate that she was supposed to follow-up with Dr. Wynonia Lawman 2 weeks after discharge, but I don't see any appointment that she has attended, nor do I see an appointment made in the future.  They tell me that they have an appointment today.  She was given information for the adult daycare program.  She states that they have trouble getting her meds  ontime but the therapy helped.  It has also produced a great schedule for her, which has helped per family but patient doesn't like it.  Pt would like to return to [facility] as she feels weak. In regards to Parkinson's disease, she is on carbidopa/levodopa 25/100, one tablet at 7 AM and half a tablet at 11 AM/ on full tablet at 3 PM/ half tablet at 7 PM.  She is noting intermittent right arm tremor.  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     ALLERGIES:   Allergies  Allergen Reactions  . Amoxicillin Anaphylaxis, Swelling, Rash and Other (See Comments)    Has patient had a PCN reaction causing immediate rash, facial/tongue/throat swelling, SOB or lightheadedness with hypotension: Yes PCN reaction causing SEVERE RASH INVOLVING MUCUS MEMBRANES  or SKIN NECROSIS: Yes Has patient had a PCN reaction that required hospitalization: No Has patient had a PCN reaction occurring within the last 10 years: Yes   . Prednisone Shortness Of Breath, Nausea Only and Other (See Comments)    Hypertension   . Amlodipine Itching and Other (See Comments)    BURNING  . Levothyroxine Other (See Comments)    Tired, shaky, tremors  . Meloxicam Swelling and Other (See Comments)    "Burning "  . Nebivolol Rash  . Nsaids Nausea And Vomiting  . Valsartan Itching    CURRENT MEDICATIONS:  Outpatient Encounter Prescriptions as of 05/23/2017  Medication Sig  . albuterol (PROVENTIL HFA;VENTOLIN HFA) 108 (90 Base) MCG/ACT inhaler Inhale 1 puff into the lungs every 6 (six) hours as needed for wheezing or shortness of breath.  Marland Kitchen aspirin EC 81 MG tablet Take 81 mg by mouth daily.  Marland Kitchen atorvastatin (LIPITOR) 40 MG tablet Take 1 tablet (40 mg total) by mouth daily at 6 PM.  . carbidopa-levodopa (SINEMET IR) 25-100 MG tablet 2 tablets at 7 AM/2 tablet at 10 AM//2 tablets at 1 PM/1 tablet at 4 PM and one tablet at 7 PM (Patient taking differently: 1 at 7 am/ 1/2 at 11 am/ 1 at 1 pm/ 1/2 at 7 pm)  . Fluticasone Furoate (ARNUITY ELLIPTA) 100 MCG/ACT AEPB Inhale 1 puff into the lungs daily.   . furosemide (LASIX) 20 MG tablet Take 1 tablet (20 mg total) by mouth daily.  . hydrALAZINE (APRESOLINE) 50 MG tablet Take 1 tablet (50 mg total) by mouth every 8 (eight) hours.  Marland Kitchen lisinopril (PRINIVIL,ZESTRIL) 20 MG tablet Take 1 tablet (20 mg total) by mouth 2 (two) times daily.  . potassium chloride (K-DUR,KLOR-CON) 10 MEQ tablet Take 10 mEq by mouth daily.   . verapamil (CALAN) 80 MG tablet Take 1 tablet (80 mg total) by mouth every 12 (twelve) hours.  . [DISCONTINUED] carbidopa-levodopa (SINEMET CR) 50-200 MG tablet Take 1 tablet by mouth at bedtime.  . [DISCONTINUED] citalopram (CELEXA) 20 MG tablet Take 10 mg by mouth daily.   . [DISCONTINUED] Elastic Bandages & Supports  (ABDOMINAL BINDER/ELASTIC MED) MISC 1 Device by Does not apply route daily. (Patient not taking: Reported on 04/18/2017)  . [DISCONTINUED] pantoprazole (PROTONIX) 40 MG tablet Take 40 mg by mouth every morning.   No facility-administered encounter medications on file as of 05/23/2017.     PAST MEDICAL HISTORY:   Past Medical History:  Diagnosis Date  . Anemia   . Anxiety    takes Klonopin as bedtime  . Asthma   . CHF (congestive heart failure) ([place])   . Depression  takes Citalopram daily  . Gout   . History of blood transfusion    "w/hysterectomy" (04/17/2017)  . Hypertension    takes Verapamil daily  . Hypothyroidism   . NSTEMI (non-ST elevated myocardial infarction) ([place]) 04/17/2017  . OSA on CPAP     "sitting on 5" (04/17/2017)  . Overactive bladder   . Parkinson's disease ([place])    takes Sinemet daily  . Peripheral edema    take Lasix as needed  . Pneumonia    "when I was a kid" (04/17/2017)  . Seasonal allergies   . Skin cancer    left elbow" (04/17/2017)  . Stroke [facility])    "weaker on my right side since; he last one I know of was in 2014" (04/17/2017)  . Tachycardia   . Urinary incontinence     PAST SURGICAL HISTORY:   Past Surgical History:  Procedure Laterality Date  . ABDOMINAL HYSTERECTOMY    . CESAREAN SECTION  1976  . LEFT HEART CATH AND CORONARY ANGIOGRAPHY N/A 04/18/2017   Procedure: LEFT HEART CATH AND CORONARY ANGIOGRAPHY;  Surgeon: Jettie Booze, MD;  Location: [place] [facility];  Service: Cardiovascular;  Laterality: N/A;  . MINOR PLACEMENT OF FIDUCIAL N/A 11/22/2016   Procedure: Fiducial placement;  Surgeon: Erline Levine, MD;  Location: [facility];  Service: Neurosurgery;  Laterality: N/A;  Fiducial placement  . PULSE GENERATOR IMPLANT Right 12/06/2016   Procedure: Unilateral PULSE GENERATOR IMPLANT;  Surgeon: Erline Levine, MD;  Location: [place];  Service: Neurosurgery;  Laterality: Right;  Unilateral PULSE GENERATOR IMPLANT  . RADIOLOGY WITH ANESTHESIA  N/A 09/13/2016   Procedure: MRI BRAIN WITH AND WITHOUT;  Surgeon: Medication Radiologist, MD;  Location: [place];  Service: Radiology;  Laterality: N/A;  . SKIN CANCER EXCISION Left    points to elbow  . SUBTHALAMIC STIMULATOR INSERTION Bilateral 11/29/2016   Procedure: Bilateral Deep brain stimulator placement;  Surgeon: Erline Levine, MD;  Location: [place];  Service: Neurosurgery;  Laterality: Bilateral;  Bilateral Deep brain stimulator placement  . THYROIDECTOMY, PARTIAL    . TONSILLECTOMY    . TUBAL LIGATION    . UTERINE FIBROID SURGERY      SOCIAL HISTORY:   Social History   Social History  . Marital status: Widowed    Spouse name: N/A  . Number of children: N/A  . Years of education: N/A   Occupational History  . retired    Social History Main Topics  . Smoking status: Former Smoker    Quit date: 09/27/2012  . Smokeless tobacco: Never Used  . Alcohol use 0.0 oz/week     Comment: 04/17/2017 "nothing since the 1980s"  . Drug use: No  . Sexual activity: No   Other Topics Concern  . Not on file   Social History Narrative  . No narrative on file    FAMILY HISTORY:   Family Status  Relation Status  . Mother Deceased       heart disease, DM, complications of fall  . Father Deceased       MI  . Sister Deceased       DM, CAD, renal failure  . Sister Alive       healthy  . Child Alive       healthy    ROS:  A complete 10 system review of systems was obtained and was unremarkable apart from what is mentioned above.  PHYSICAL EXAMINATION:    VITALS:   Vitals:   05/23/17 0847  BP:  100/66  Pulse: (!) 54  SpO2: 94%  Weight: 164 lb (74.4 kg)  Height: 5\' 3"  (1.6 m)    GEN:  The patient appears stated age and is in NAD. HEENT:  Normocephalic, atraumatic.  The mucous membranes are moist. The superficial temporal arteries are without ropiness or tenderness. CV:  Bradycardic.  Regular. Lungs:  CTAB.  There is dyspnea on exertion. Neck/HEME:  There are no carotid  bruits bilaterally. Skin: Incisions over the chest and scalp are well-healed.  Neurological examination:  Orientation: The patient is alert and oriented x3. Fund of knowledge is appropriate.   Cranial nerves: There is good facial symmetry.  There is mild facial hypomimia.   The speech is fluent and very minimally dysarthric.   No issues with EOM movement.  Soft palate rises symmetrically and there is no tongue deviation. Hearing is intact to conversational tone. Sensation: Sensation is intact to light touch throughout.  Movement examination: Tone: There is no rigidity.   Abnormal movements:  She has mild dyskinesia of the L leg and foot.  No tremor noted Coordination: There isdecremation with any form of RAMS, including alternating supination and pronation of the forearm, hand opening and closing, finger taps, heel taps and toe taps on the right.   Gait and Station: The patient has no difficulty arising out of a deep-seated chair without the use of the hands. The patient walks very well with her walker.  She turns well.  DBS programming was performed today.    ASSESSMENT/PLAN:  1.  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  -The patient is continuing to experience motor fluctuations including dyskinesia and on/off.    -The patient is status post bilateral STN DBS with the [facility] [employer] device on 11/29/2016 with IPG placed on 12/07/2016.  Her device was activated on 01/02/2017.  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  I have no objection to that, but encouraged her to continue with her adult daycare on the opposite days.  2.  RBD  -Continue klonopin - 0.5 mg - 1/2 po qhs  Risks, benefits, side effects and alternative therapies were discussed.  The opportunity to ask questions was given and they were  answered to the best of my ability.  The patient expressed understanding and willingness to follow the outlined treatment protocols.  -we will continue to hold remeron since that was primarily for sleep (it did work if need to come back to that)  -get bed rails  3.  Vasomotor rhinitis  -Has atropine ophthalmic.  She needs to try and increase to tid.    4.  OSAS and SOB  -She has CPAP.  5.  Dizziness/Orthostatic hypotension  -Primary care physician is decreasing her verapamil.  States she is still noting low BP but thinks that it is being lowered too slow.  Several medications for her heart were added after her recent heart attack, which are contributing to lowering of blood pressure.  She will talk to Dr. Wynonia Lawman later today.  6.  Recent NSTEMI in 04/2017  -was supposed to have f/u already with Dr. Wynonia Lawman.  She has a follow-up record after this appointment today.  7.  Follow up is anticipated in the next few months, sooner should new neurologic issues arise.  Much greater than 50% of this visit was spent  in counseling and coordinating care.  Total face to face time:  25 min which didn't include DBS programming time.

## 2017-05-23 ENCOUNTER — Ambulatory Visit (INDEPENDENT_AMBULATORY_CARE_PROVIDER_SITE_OTHER): Payer: Medicare (Managed Care) | Admitting: Neurology

## 2017-05-23 ENCOUNTER — Encounter: Payer: Self-pay | Admitting: Cardiology

## 2017-05-23 ENCOUNTER — Encounter: Payer: Self-pay | Admitting: Neurology

## 2017-05-23 VITALS — BP 100/66 | HR 54 | Ht 63.0 in | Wt 164.0 lb

## 2017-05-23 DIAGNOSIS — G20 Parkinson's disease: Secondary | ICD-10-CM | POA: Diagnosis not present

## 2017-05-23 DIAGNOSIS — G249 Dystonia, unspecified: Secondary | ICD-10-CM | POA: Diagnosis not present

## 2017-05-23 DIAGNOSIS — R42 Dizziness and giddiness: Secondary | ICD-10-CM

## 2017-05-23 NOTE — Patient Instructions (Signed)
We will see you on 08/16/17 at 9:45am

## 2017-05-23 NOTE — Progress Notes (Signed)
Ana Salazar    Date of visit:  05/23/2017 DOB:  06-02-1946    Age:  71 yrs. Medical record number:  [id number]     Account number:  [id number] Primary Care Provider: [place], ____________________________ CURRENT DIAGNOSES  1. CAD Native without angina  2. Hypertensive heart disease without heart failure  3. Supraventricular tachycardia  4. Aortic valve disorder, unspecified  5. Primary Hyperparathyroidism  6. Hyperlipidemia  7. Parkinson's disease  8. Obesity  9. Hypothyroidism  10. Supraventricular tachycardia  11. Dizziness and giddiness  12. Encounter for preprocedural cardiovascular examination ____________________________ ALLERGIES  Amlodipine, Drops b/p  Amoxicillin, Edema  Meloxicam, Intolerance-unknown  Nebivolol, Rash  Valsartan, Drops b/p ____________________________ MEDICATIONS  1. Arnuity Ellipta 200 mcg/actuation powder for inhalation, Take as directed  2. aspirin 81 mg chewable tablet, 1 p.o. daily  3. atorvastatin 40 mg tablet, 1 p.o. daily  4. carbidopa 25 mg-levodopa 100 mg tablet, 1 tablet in AM 1/2 tab at 11am 1 tab at 3pm and 1/2 tab at 7am  5. Colace 100 mg capsule, PRN  6. cranberry 400 mg capsule, three times a week  7. fluticasone 50 mcg/actuation spray,suspension, PRN  8. furosemide 20 mg tablet, 1 p.o. daily  9. hydralazine 50 mg tablet, twice a day  10. Klor-Con 10 10 mEq tablet extended release, 1 p.o. daily  11. lisinopril 20 mg tablet, BID  12. verapamil 80 mg tablet, BID ____________________________ HISTORY OF PRESENT ILLNESS  Patient returns early for cardiac followup. The patient was readmitted to [facility] with recurrent supraventricular tachycardia and again had elevation of troponin. She had a similar episode in August when she was admitted and had a cardiac catheterization showing an occluded branch of the marginal artery. She had recurrent SVT again stopping with adenosine and had elevated troponins again.  I spoke with the physicians down there and she was discharged. She has had episodes of low blood pressure at times and has felt weak. She doesn't really have angina except when she has SVT. She saw the neurologist today. She still walks with a walker and is having her neurostimulator adjusted to help with her situation. She normally denies PND orthopnea or significant edema. In between times she does not have SVT. She has a long history of SVT and when she saw me a number of years ago we placed her on verapamil to control but well up until recently. Because of weakness her verapamil was changed recently which initiated the whole spectrum of arrhythmias that she has had recurrent SVT despite being on verapamil. No definite syncope. ____________________________ PAST HISTORY  Past Medical Illnesses:  hypertension, obesity, hyperlipidemia, Parkinson's disease, hyperparathyroidism, allergic rhiniits, sleep apnea;  Cardiovascular Illnesses:  arrhythmia-SVT, CAD;  Surgical Procedures:  cesarean section, hysterectomy, tonsillectomy, partial thyroidectomy, deep brain stimulation;  NYHA Classification:  I;  Canadian Angina Classification:  Class 0: Asymptomatic;  Cardiology Procedures-Invasive:  cardiac cath (left) August 2018;  Cardiology Procedures-Noninvasive:  lexiscan cardiolite March 2016, echocardiogram February 2016, event monitor December 2016;  Cardiac Cath Results:  occluded OM 2, 50% stenosis mid LAD, small nondominant RCA with 95% mid;  LVEF of 55% documented via cardiac cath on 04/18/2017,   ____________________________ CARDIO-PULMONARY TEST DATES EKG Date:  05/23/2017;   Cardiac Cath Date:  04/18/2017;  Holter/Event Monitor Date: 08/18/2015;  Nuclear Study Date:  11/06/2014;  Echocardiography Date: 04/18/2017;  Chest Xray Date: 04/16/2017;  CT Scan Date:  01/20/2006   ____________________________ FAMILY HISTORY Father -- Father dead, Myocardial infarction Mother --  Mother dead, Liver disease Sister  -- Diabetes mellitus, Pulmonary emphysema, Congestive heart failure, Sister alive with problem Sister -- Sister alive and well ____________________________ SOCIAL HISTORY Alcohol Use:  no alcohol use;  Smoking:  25 pack year history, used to smoke but quit 2007;  Diet:  regular diet;  Lifestyle:  widowed and 3 daughters;  Exercise:  some exercise;  Occupation:  retired;  Residence:  lives with mother;   ____________________________ REVIEW OF SYSTEMS General:  malaise and fatigue  Integumentary:rosacea on face Eyes: wears eye glasses/contact lenses Ears, Nose, Throat, Mouth:  partial hearing loss Respiratory: mild dyspnea with exertion Cardiovascular:  please review HPI  Musculoskeletal:  pain in ankles Neurological:  dizzness, Parkinson's disease Psychiatric:  insomnia  ____________________________ PHYSICAL EXAMINATION VITAL SIGNS  Blood Pressure:  140/80 Sitting, Left arm, regular cuff  , 138/80 Standing, Left arm and regular cuff   Pulse:  60/min. Weight:  165.00 lbs. Height:  65.00"BMI: 27  Constitutional:  pleasant white female, in no acute distress, moderately obese walks with walker Skin:  rosacea of face Head:  normocephalic, normal hair pattern, no masses or tenderness Eyes:  EOMS Intact, PERRLA, C and S clear, Funduscopic exam not done. ENT:  ears, nose and throat reveal no gross abnormalities.  Dentition good. Neck:  healed anterior neck scar Chest:  normal symmetry, clear to auscultation. Cardiac:  regular rhythm, normal S1 and S2, no S3 or S4, grade 2/6 systolic murmur heard best at the base Peripheral Pulses:  pulses full and equal in all extremities Extremities & Back:  trace edema Neurological:  unsteady gait, tremors of the hands, tardive dyskinesis ____________________________ MOST RECENT LIPID PANEL 04/18/17  CHOL TOTL 158 mg/dl, LDL 101 NM, HDL 38 mg/dl and TRIGLYCER 95 mg/dl ____________________________ IMPRESSIONS/PLAN  1. Recurrent supraventricular tachycardia  causing non-STEMI 2. CAD with occluded marginal branch 3. Severe parkinsonism 4. Hypertensive heart disease 5. Hyperlipidemia 6. 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 I discussed with the patient and her daughter considering ablation in order to prevent this. We discussed ablation as well as potential chance of pacemaker. I would like to refer her to electrophysiology for opinion for SVT ablation. She has been having some episodes of weakness and asked her to reduce her hydralazine to 50 mg twice daily and if she continues to have low blood pressure with this to reduce it further to 25 mg twice daily. Await results of electrophysiology evaluation. Records from recent hospitalization at [facility] reviewed. Twelve-lead EKG personally reviewed by me shows sinus bradycardia at a rate of 48.  Greater than 40 minutes spent with patient and daughter including greater than 50% of the time face-to-face.  ____________________________ TODAYS ORDERS  1. 12 Lead EKG: Today  2. Referral to EP                       ____________________________ Cardiology Physician:  Kerry Hough MD [facility]

## 2017-05-23 NOTE — Procedures (Signed)
DBS Programming was performed on [place] Sci Device  Total time spent programming was 25 minutes.  Device was turned on.   Impedences were checked and were within normal limits.  Battery was checked and was determined to be functioning normally and not near the end of life.   Pt charging weekly for about 1 hour each time.  Final settings were as follows:  Left brain electrode:     2-C+           ; Amplitude  1.7   mAmp   ; Pulse width 60 microseconds;   Frequency   130   Hz. (impedence 1066)  Right brain electrode:     11-C+          ; Amplitude   1.5  mAmps ;  Pulse width 60  microseconds;  Frequency   130    Hz. (impedence 1182)

## 2017-05-24 ENCOUNTER — Encounter: Payer: Self-pay | Admitting: Neurology

## 2017-05-30 ENCOUNTER — Ambulatory Visit (INDEPENDENT_AMBULATORY_CARE_PROVIDER_SITE_OTHER): Payer: Medicare (Managed Care) | Admitting: Internal Medicine

## 2017-05-30 ENCOUNTER — Encounter: Payer: Self-pay | Admitting: Internal Medicine

## 2017-05-30 ENCOUNTER — Telehealth: Payer: Self-pay

## 2017-05-30 ENCOUNTER — Encounter (INDEPENDENT_AMBULATORY_CARE_PROVIDER_SITE_OTHER): Payer: Self-pay

## 2017-05-30 VITALS — BP 128/84 | HR 58 | Ht 63.0 in | Wt 159.2 lb

## 2017-05-30 DIAGNOSIS — I471 Supraventricular tachycardia: Secondary | ICD-10-CM | POA: Diagnosis not present

## 2017-05-30 NOTE — Telephone Encounter (Signed)
Call back received.  Pt to see Dr. Lovena Le today @ 1200.

## 2017-05-30 NOTE — Telephone Encounter (Signed)
Outreach made to Pt.  Spoke with Pt sister.  Sister will call Pt to see if she is available today.  Await call back.

## 2017-05-30 NOTE — Patient Instructions (Addendum)
Medication Instructions:  Your physician recommends that you continue on your current medications as directed. Please refer to the Current Medication list given to you today.  Labwork: You will need blood work prior to your procedure:  BMP and CBC will be drawn today.  Testing/Procedures: Your physician has recommended that you have an ablation. Catheter ablation is a medical procedure used to treat some cardiac arrhythmias (irregular heartbeats). During catheter ablation, a long, thin, flexible tube is put into a blood vessel in your groin (upper thigh), or neck. This tube is called an ablation catheter. It is then guided to your heart through the blood vessel. Radio frequency waves destroy small areas of heart tissue where abnormal heartbeats may cause an arrhythmia to start.   Follow-Up:  You will follow up with Dr. Lovena Le in 4 weeks.    Any Other Special Instructions Will Be Listed Below (If Applicable).  Please arrive at the [facility] main entrance of [place] hospital at:  06/12/2017 @ 0530. Do not eat or drink after midnight prior to procedure Do not take any medications the morning of the procedure PLEASE HOLD the following meds prior to your procedure: Do not take your verapamil the whole day before your procedure ( Do not take Sunday 06/11/2017) Plan for one night stay-but you may be discharged You will need someone to drive you home at discharge   If you need a refill on your cardiac medications before your next appointment, please call your pharmacy.   Cardiac Ablation Cardiac ablation is a procedure to disable (ablate) a small amount of heart tissue in very specific places. The heart has many electrical connections. Sometimes these connections are abnormal and can cause the heart to beat very fast or irregularly. Ablating some of the problem areas can improve the heart rhythm or return it to normal. Ablation may be done for people who:  Have Wolff-Parkinson-White  syndrome.  Have fast heart rhythms (tachycardia).  Have taken medicines for an abnormal heart rhythm (arrhythmia) that were not effective or caused side effects.  Have a high-risk heartbeat that may be life-threatening.  During the procedure, a small incision is made in the neck or the groin, and a long, thin, flexible tube (catheter) is inserted into the incision and moved to the heart. Small devices (electrodes) on the tip of the catheter will send out electrical currents. A type of X-ray (fluoroscopy) will be used to help guide the catheter and to provide images of the heart. Tell a health care provider about:  Any allergies you have.  All medicines you are taking, including vitamins, herbs, eye drops, creams, and over-the-counter medicines.  Any problems you or family members have had with anesthetic medicines.  Any blood disorders you have.  Any surgeries you have had.  Any medical conditions you have, such as kidney failure.  Whether you are pregnant or may be pregnant. What are the risks? Generally, this is a safe procedure. However, problems may occur, including:  Infection.  Bruising and bleeding at the catheter insertion site.  Bleeding into the chest, especially into the sac that surrounds the heart. This is a serious complication.  Stroke or blood clots.  Damage to other structures or organs.  Allergic reaction to medicines or dyes.  Need for a permanent pacemaker if the normal electrical system is damaged. A pacemaker is a small computer that sends electrical signals to the heart and helps your heart beat normally.  The procedure not being fully effective. This may  not be recognized until months later. Repeat ablation procedures are sometimes required.  What happens before the procedure?  Follow instructions from your health care provider about eating or drinking restrictions.  Ask your health care provider about: ? Changing or stopping your regular  medicines. This is especially important if you are taking diabetes medicines or blood thinners. ? Taking medicines such as aspirin and ibuprofen. These medicines can thin your blood. Do not take these medicines before your procedure if your health care provider instructs you not to.  Plan to have someone take you home from the hospital or clinic.  If you will be going home right after the procedure, plan to have someone with you for 24 hours. What happens during the procedure?  To lower your risk of infection: ? Your health care team will wash or sanitize their hands. ? Your skin will be washed with soap. ? Hair may be removed from the incision area.  An IV tube will be inserted into one of your veins.  You will be given a medicine to help you relax (sedative).  The skin on your neck or groin will be numbed.  An incision will be made in your neck or your groin.  A needle will be inserted through the incision and into a large vein in your neck or groin.  A catheter will be inserted into the needle and moved to your heart.  Dye may be injected through the catheter to help your surgeon see the area of the heart that needs treatment.  Electrical currents will be sent from the catheter to ablate heart tissue in desired areas. There are three types of energy that may be used to ablate heart tissue: ? Heat (radiofrequency energy). ? Laser energy. ? Extreme cold (cryoablation).  When the necessary tissue has been ablated, the catheter will be removed.  Pressure will be held on the catheter insertion area to prevent excessive bleeding.  A bandage (dressing) will be placed over the catheter insertion area. The procedure may vary among health care providers and hospitals. What happens after the procedure?  Your blood pressure, heart rate, breathing rate, and blood oxygen level will be monitored until the medicines you were given have worn off.  Your catheter insertion area will be  monitored for bleeding. You will need to lie still for a few hours to ensure that you do not bleed from the catheter insertion area.  Do not drive for 24 hours or as long as directed by your health care provider. Summary  Cardiac ablation is a procedure to disable (ablate) a small amount of heart tissue in very specific places. Ablating some of the problem areas can improve the heart rhythm or return it to normal.  During the procedure, electrical currents will be sent from the catheter to ablate heart tissue in desired areas. This information is not intended to replace advice given to you by your health care provider. Make sure you discuss any questions you have with your health care provider. Document Released: 01/08/2009 Document Revised: 07/11/2016 Document Reviewed: 07/11/2016 Elsevier Interactive Patient Education  [employer].

## 2017-05-30 NOTE — Telephone Encounter (Signed)
-----   Message from Evans Lance, MD sent at 05/25/2017 10:30 PM EDT ----- Regarding: RE: Consult for ?ablation Be glad to. Thanks for the referral. We will try and get her in to be seen in the next week. GT ----- Message ----- From: Jacolyn Reedy, MD Sent: 05/23/2017  12:08 PM To: Evans Lance, MD, Debbora Dus, # Subject: Consult for ?ablation                          Ana Salazar,  Can you see [name] to see if she would benefit from SVT ablation? She has longstanding SVT usually well controlled with verapamil but has had 2 episodes recently that resulted in non STEMI following prolonged SVT and she now has resting bradycardia that makes it difficult to titrate meds?  Please contact her.  Thanks,  Frederico Hamman  My note is in the chart

## 2017-05-30 NOTE — Progress Notes (Signed)
HPI Ana Salazar is referred today by Dr. Wynonia Lawman for evaluation of SVT. 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 Until the last couple of years, she was able to stop her episodes with vagal maneuvers or Valsalva. The patient has suddenly had non-ST elevation MIs with her prolonged episodes of SVT. These episodes start and stop suddenly. They are associated with shortness of breath and chest pressure. She has had troponin elevations up to 10. Heart catheterization demonstrated mostly nonobstructive coronary disease but she does have occluded second marginal and 75% RCA stenoses. She has been on calcium channel blockers for prevention of her SVT but has had multiple breakthroughs despite medical therapy. Allergies  Allergen Reactions  . Amoxicillin Anaphylaxis, Swelling, Rash and Other (See Comments)    Has patient had a PCN reaction causing immediate rash, facial/tongue/throat swelling, SOB or lightheadedness with hypotension: Yes PCN reaction causing SEVERE RASH INVOLVING MUCUS MEMBRANES or SKIN NECROSIS: Yes Has patient had a PCN reaction that required hospitalization: No Has patient had a PCN reaction occurring within the last 10 years: Yes   . Prednisone Shortness Of Breath, Nausea Only and Other (See Comments)    Hypertension   . Amlodipine Itching and Other (See Comments)    BURNING  . Levothyroxine Other (See Comments)    Tired, shaky, tremors  . Meloxicam Swelling and Other (See Comments)    "Burning "  . Nebivolol Rash  . Nsaids Nausea And Vomiting  . Valsartan Itching     Current Outpatient Prescriptions  Medication Sig Dispense Refill  . albuterol (PROVENTIL HFA;VENTOLIN HFA) 108 (90 Base) MCG/ACT inhaler Inhale 1 puff into the lungs every 6 (six) hours as needed for wheezing or shortness of breath.    Marland Kitchen aspirin EC 81 MG tablet Take 81 mg by mouth daily.    Marland Kitchen atorvastatin (LIPITOR) 40 MG tablet Take 1 tablet (40 mg  total) by mouth daily at 6 PM. 30 tablet 0  . carbidopa-levodopa (SINEMET IR) 25-100 MG tablet Take 1 tablet by mouth at 7 am, 1/2 tablet at 11 am, 1 tablet at 3 pm and 1/2 tablet at 7 pm    . Fluticasone Furoate (ARNUITY ELLIPTA) 100 MCG/ACT AEPB Inhale 1 puff into the lungs daily.     . furosemide (LASIX) 20 MG tablet Take 1 tablet (20 mg total) by mouth daily. 30 tablet 0  . hydrALAZINE (APRESOLINE) 50 MG tablet Take 25 mg by mouth 2 (two) times daily.    Marland Kitchen lisinopril (PRINIVIL,ZESTRIL) 20 MG tablet Take 1 tablet (20 mg total) by mouth 2 (two) times daily. 30 tablet 0  . potassium chloride (K-DUR,KLOR-CON) 10 MEQ tablet Take 10 mEq by mouth daily.     . verapamil (CALAN) 80 MG tablet Take 1 tablet (80 mg total) by mouth every 12 (twelve) hours. 60 tablet 0   No current facility-administered medications for this visit.      Past Medical History:  Diagnosis Date  . Anemia   . Anxiety    takes Klonopin as bedtime  . Asthma   . CHF (congestive heart failure) ([place])   . Depression    takes Citalopram daily  . Gout   . History of blood transfusion    "w/hysterectomy" (04/17/2017)  . Hypertension    takes Verapamil daily  . Hypothyroidism   . NSTEMI (non-ST elevated myocardial infarction) ([place]) 04/17/2017  . OSA on CPAP     "sitting  on 5" (04/17/2017)  . Overactive bladder   . Parkinson's disease ([place])    takes Sinemet daily  . Peripheral edema    take Lasix as needed  . Pneumonia    "when I was a kid" (04/17/2017)  . Seasonal allergies   . Skin cancer    left elbow" (04/17/2017)  . Stroke [facility])    "weaker on my right side since; he last one I know of was in 2014" (04/17/2017)  . Tachycardia   . Urinary incontinence     ROS:   All systems reviewed and negative except as noted in the HPI.   Past Surgical History:  Procedure Laterality Date  . ABDOMINAL HYSTERECTOMY    . CESAREAN SECTION  1976  . LEFT HEART CATH AND CORONARY ANGIOGRAPHY N/A 04/18/2017   Procedure: LEFT  HEART CATH AND CORONARY ANGIOGRAPHY;  Surgeon: Jettie Booze, MD;  Location: [place] [facility];  Service: Cardiovascular;  Laterality: N/A;  . MINOR PLACEMENT OF FIDUCIAL N/A 11/22/2016   Procedure: Fiducial placement;  Surgeon: Erline Levine, MD;  Location: [place];  Service: Neurosurgery;  Laterality: N/A;  Fiducial placement  . PULSE GENERATOR IMPLANT Right 12/06/2016   Procedure: Unilateral PULSE GENERATOR IMPLANT;  Surgeon: Erline Levine, MD;  Location: [place];  Service: Neurosurgery;  Laterality: Right;  Unilateral PULSE GENERATOR IMPLANT  . RADIOLOGY WITH ANESTHESIA N/A 09/13/2016   Procedure: MRI BRAIN WITH AND WITHOUT;  Surgeon: Medication Radiologist, MD;  Location: [place];  Service: Radiology;  Laterality: N/A;  . SKIN CANCER EXCISION Left    points to elbow  . SUBTHALAMIC STIMULATOR INSERTION Bilateral 11/29/2016   Procedure: Bilateral Deep brain stimulator placement;  Surgeon: Erline Levine, MD;  Location: [place];  Service: Neurosurgery;  Laterality: Bilateral;  Bilateral Deep brain stimulator placement  . THYROIDECTOMY, PARTIAL    . TONSILLECTOMY    . TUBAL LIGATION    . UTERINE FIBROID SURGERY       Family History  Problem Relation Age of Onset  . Heart disease Father      Social History   Social History  . Marital status: Widowed    Spouse name: N/A  . Number of children: N/A  . Years of education: N/A   Occupational History  . retired    Social History Main Topics  . Smoking status: Former Smoker    Quit date: 09/27/2012  . Smokeless tobacco: Never Used  . Alcohol use 0.0 oz/week     Comment: 04/17/2017 "nothing since the 1980s"  . Drug use: No  . Sexual activity: No   Other Topics Concern  . Not on file   Social History Narrative  . No narrative on file     BP 128/84   Pulse (!) 58   Ht 5\' 3"  (1.6 m)   Wt 159 lb 3.2 oz (72.2 kg)   SpO2 99%   BMI 28.20 kg/m   Physical Exam:  Well appearing 71 year old woman, NAD HEENT: Unremarkable Neck:  7 cm  JVD, no thyromegally Lymphatics:  No adenopathy Back:  No CVA tenderness Lungs:  Clear, with no wheezes, rales, or rhonchi HEART:  Regular rate rhythm, no murmurs, no rubs, no clicks Abd:  soft, positive bowel sounds, no organomegally, no rebound, no guarding Ext:  2 plus pulses, no edema, no cyanosis, no clubbing Skin:  No rashes no nodules Neuro:  CN II through XII intact, motor grossly intact  EKG - reviewed. Normal sinus rhythm with no ventricular preexcitation ECG during SVT  demonstrates a narrow complex short RP tachycardia.  Assess/Plan: 1. Recurrent medically refractory SVT - I've discussed the treatment options with the patient and her sister. The risk, goals, benefits, and expectations of catheter ablation were reviewed and she wishes to proceed. This will be scheduled as soon as possible. 2. Coronary artery disease -she will continue her medical therapy. Hopefully when her SVT is controlled, her symptoms will be also well-controlled. 3. Dyslipidemia - she will continue atorvastatin.   Cristopher Peru, M.D.

## 2017-05-31 ENCOUNTER — Telehealth: Payer: Self-pay

## 2017-05-31 LAB — BASIC METABOLIC PANEL
BUN / CREAT RATIO: 23 (ref 12–28)
BUN: 20 mg/dL (ref 8–27)
CALCIUM: 9.6 mg/dL (ref 8.7–10.3)
CHLORIDE: 105 mmol/L (ref 96–106)
CO2: 24 mmol/L (ref 20–29)
CREATININE: 0.88 mg/dL (ref 0.57–1.00)
GFR calc Af Amer: 78 mL/min/{1.73_m2} (ref >59)
GFR calc non Af Amer: 67 mL/min/{1.73_m2} (ref >59)
GLUCOSE: 97 mg/dL (ref 65–99)
Potassium: 4.4 mmol/L (ref 3.5–5.2)
Sodium: 143 mmol/L (ref 134–144)

## 2017-05-31 LAB — CBC
HEMOGLOBIN: 12.6 g/dL (ref 11.1–15.9)
Hematocrit: 39.3 % (ref 34.0–46.6)
MCH: 30.1 pg (ref 26.6–33.0)
MCHC: 32.1 g/dL (ref 31.5–35.7)
MCV: 94 fL (ref 79–97)
Platelets: 198 10*3/uL (ref 150–379)
RBC: 4.18 x10E6/uL (ref 3.77–5.28)
RDW: 15 % (ref 12.3–15.4)
WBC: 5.9 10*3/uL (ref 3.4–10.8)

## 2017-05-31 MED ORDER — VERAPAMIL HCL 80 MG PO TABS: 80.0000 mg | ORAL_TABLET | Freq: Three times a day (TID) | ORAL | 3 refills | Status: DC

## 2017-05-31 NOTE — Telephone Encounter (Signed)
Call received from [facility] from Pt PCP.  Per PCP, wondering if Dr. Lovena Le had decided Pt should be on verapamil 80 mg bid.  Historically Pt taking verapamil 80 mg tid to control svt.  Per review of Pt chart, Pt med changes have been made during recent hospitalization.  Dr. Lovena Le did not make any med changes during Pt last visit.  Notified PCP, Dr. Lovena Le continued Pt current meds which was the Verapamil 80 mg bid.  PCP states she will be increasing to TID.  Thanked for the information.  Will update Pt med list.

## 2017-06-08 ENCOUNTER — Ambulatory Visit: Payer: No Typology Code available for payment source | Admitting: Neurology

## 2017-06-09 ENCOUNTER — Telehealth: Payer: Self-pay | Admitting: Internal Medicine

## 2017-06-09 NOTE — Telephone Encounter (Signed)
Patient's sister called to ask what to do if patient's heart starts to race on Sunday after she stops her Verapamil.  Her ablation is scheduled on Monday. Informed her she may call the office number if her heart starts to race. She will take it easy this weekend. She was grateful for call and agrees with treatment plan.

## 2017-06-09 NOTE — Telephone Encounter (Signed)
New message  Ana Salazar verbalized that she is calling for the rn   She has some questions she want to ask

## 2017-06-12 ENCOUNTER — Encounter (HOSPITAL_COMMUNITY)
Admission: RE | Disposition: A | Discharge status: Home / Self Care | Payer: Self-pay | Source: Ambulatory Visit | Attending: Internal Medicine

## 2017-06-12 ENCOUNTER — Ambulatory Visit (HOSPITAL_COMMUNITY)
Admission: RE | Admit: 2017-06-12 | Discharge: 2017-06-12 | Disposition: A | Discharge status: Home / Self Care | Payer: Medicare (Managed Care) | Source: Ambulatory Visit | Attending: Internal Medicine | Admitting: Internal Medicine

## 2017-06-12 DIAGNOSIS — F419 Anxiety disorder, unspecified: Secondary | ICD-10-CM | POA: Insufficient documentation

## 2017-06-12 DIAGNOSIS — Z85828 Personal history of other malignant neoplasm of skin: Secondary | ICD-10-CM | POA: Insufficient documentation

## 2017-06-12 DIAGNOSIS — I509 Heart failure, unspecified: Secondary | ICD-10-CM | POA: Insufficient documentation

## 2017-06-12 DIAGNOSIS — E785 Hyperlipidemia, unspecified: Secondary | ICD-10-CM | POA: Diagnosis not present

## 2017-06-12 DIAGNOSIS — I252 Old myocardial infarction: Secondary | ICD-10-CM | POA: Diagnosis not present

## 2017-06-12 DIAGNOSIS — G4733 Obstructive sleep apnea (adult) (pediatric): Secondary | ICD-10-CM | POA: Diagnosis not present

## 2017-06-12 DIAGNOSIS — Z87891 Personal history of nicotine dependence: Secondary | ICD-10-CM | POA: Diagnosis not present

## 2017-06-12 DIAGNOSIS — G20 Parkinson's disease: Secondary | ICD-10-CM | POA: Insufficient documentation

## 2017-06-12 DIAGNOSIS — I110 Hypertensive heart disease with heart failure: Secondary | ICD-10-CM | POA: Diagnosis not present

## 2017-06-12 DIAGNOSIS — I471 Supraventricular tachycardia, unspecified: Secondary | ICD-10-CM | POA: Diagnosis present

## 2017-06-12 DIAGNOSIS — F329 Major depressive disorder, single episode, unspecified: Secondary | ICD-10-CM | POA: Insufficient documentation

## 2017-06-12 DIAGNOSIS — Z880 Allergy status to penicillin: Secondary | ICD-10-CM | POA: Insufficient documentation

## 2017-06-12 DIAGNOSIS — Z79899 Other long term (current) drug therapy: Secondary | ICD-10-CM | POA: Insufficient documentation

## 2017-06-12 DIAGNOSIS — E039 Hypothyroidism, unspecified: Secondary | ICD-10-CM | POA: Diagnosis not present

## 2017-06-12 DIAGNOSIS — Z886 Allergy status to analgesic agent status: Secondary | ICD-10-CM | POA: Insufficient documentation

## 2017-06-12 DIAGNOSIS — Z8673 Personal history of transient ischemic attack (TIA), and cerebral infarction without residual deficits: Secondary | ICD-10-CM | POA: Diagnosis not present

## 2017-06-12 DIAGNOSIS — Z7982 Long term (current) use of aspirin: Secondary | ICD-10-CM | POA: Insufficient documentation

## 2017-06-12 DIAGNOSIS — I2510 Atherosclerotic heart disease of native coronary artery without angina pectoris: Secondary | ICD-10-CM | POA: Diagnosis not present

## 2017-06-12 HISTORY — PX: SVT ABLATION: EP1225

## 2017-06-12 SURGERY — SVT ABLATION

## 2017-06-12 MED ORDER — FENTANYL CITRATE (PF) 100 MCG/2ML IJ SOLN
INTRAMUSCULAR | Status: AC
  Filled 2017-06-12: qty 2

## 2017-06-12 MED ORDER — BUPIVACAINE HCL (PF) 0.25 % IJ SOLN
INTRAMUSCULAR | Status: AC
  Filled 2017-06-12: qty 30

## 2017-06-12 MED ORDER — HEPARIN (PORCINE) IN NACL 2-0.9 UNIT/ML-% IJ SOLN
INTRAMUSCULAR | Status: AC | PRN
  Administered 2017-06-12: 500 mL

## 2017-06-12 MED ORDER — ACETAMINOPHEN 325 MG PO TABS: 650.0000 mg | ORAL_TABLET | ORAL | Status: DC | PRN

## 2017-06-12 MED ORDER — FENTANYL CITRATE (PF) 100 MCG/2ML IJ SOLN
INTRAMUSCULAR | Status: DC | PRN
  Administered 2017-06-12: 12.5 ug via INTRAVENOUS
  Administered 2017-06-12: 25 ug via INTRAVENOUS
  Administered 2017-06-12: 12.5 ug via INTRAVENOUS

## 2017-06-12 MED ORDER — HEPARIN (PORCINE) IN NACL 2-0.9 UNIT/ML-% IJ SOLN
INTRAMUSCULAR | Status: AC
  Filled 2017-06-12: qty 500

## 2017-06-12 MED ORDER — ALBUTEROL SULFATE (2.5 MG/3ML) 0.083% IN NEBU
2.5000 mg | INHALATION_SOLUTION | Freq: Four times a day (QID) | RESPIRATORY_TRACT | Status: DC | PRN
  Filled 2017-06-12: qty 3

## 2017-06-12 MED ORDER — CARBIDOPA-LEVODOPA 25-100 MG PO TABS
1.0000 | ORAL_TABLET | Freq: Two times a day (BID) | ORAL | Status: DC
  Administered 2017-06-12: 1 via ORAL
  Filled 2017-06-12 (×2): qty 1

## 2017-06-12 MED ORDER — HYDRALAZINE HCL 25 MG PO TABS
25.0000 mg | ORAL_TABLET | Freq: Two times a day (BID) | ORAL | Status: DC
  Administered 2017-06-12: 25 mg via ORAL
  Filled 2017-06-12 (×2): qty 1

## 2017-06-12 MED ORDER — HYDRALAZINE HCL 20 MG/ML IJ SOLN
INTRAMUSCULAR | Status: AC
  Filled 2017-06-12: qty 1

## 2017-06-12 MED ORDER — FUROSEMIDE 20 MG PO TABS
20.0000 mg | ORAL_TABLET | Freq: Every day | ORAL | Status: DC
  Filled 2017-06-12: qty 1

## 2017-06-12 MED ORDER — SODIUM CHLORIDE 0.9% FLUSH: 3.0000 mL | Freq: Two times a day (BID) | INTRAVENOUS | Status: DC

## 2017-06-12 MED ORDER — FUROSEMIDE 10 MG/ML IJ SOLN
INTRAMUSCULAR | Status: AC
  Filled 2017-06-12: qty 4

## 2017-06-12 MED ORDER — ISOPROTERENOL HCL 0.2 MG/ML IJ SOLN
INTRAMUSCULAR | Status: AC
  Filled 2017-06-12: qty 5

## 2017-06-12 MED ORDER — CIPROFLOXACIN HCL 500 MG PO TABS
500.0000 mg | ORAL_TABLET | Freq: Two times a day (BID) | ORAL | Status: DC
  Administered 2017-06-12: 500 mg via ORAL
  Filled 2017-06-12 (×3): qty 1

## 2017-06-12 MED ORDER — CARBIDOPA-LEVODOPA 25-100 MG PO TABS: 0.5000 | ORAL_TABLET | Freq: Four times a day (QID) | ORAL | Status: DC

## 2017-06-12 MED ORDER — ASPIRIN EC 81 MG PO TBEC
81.0000 mg | DELAYED_RELEASE_TABLET | Freq: Every day | ORAL | Status: DC
  Administered 2017-06-12: 81 mg via ORAL
  Filled 2017-06-12: qty 1

## 2017-06-12 MED ORDER — FUROSEMIDE 10 MG/ML IJ SOLN
40.0000 mg | Freq: Once | INTRAMUSCULAR | Status: AC
  Administered 2017-06-12: 40 mg via INTRAVENOUS

## 2017-06-12 MED ORDER — VERAPAMIL HCL 80 MG PO TABS
80.0000 mg | ORAL_TABLET | Freq: Three times a day (TID) | ORAL | Status: DC
  Administered 2017-06-12: 80 mg via ORAL
  Filled 2017-06-12 (×3): qty 1

## 2017-06-12 MED ORDER — MIDAZOLAM HCL 5 MG/5ML IJ SOLN
INTRAMUSCULAR | Status: DC | PRN
  Administered 2017-06-12: 2 mg via INTRAVENOUS
  Administered 2017-06-12 (×2): 1 mg via INTRAVENOUS

## 2017-06-12 MED ORDER — CIPROFLOXACIN HCL 500 MG PO TABS: 500.0000 mg | ORAL_TABLET | Freq: Two times a day (BID) | ORAL | 0 refills | Status: AC

## 2017-06-12 MED ORDER — HYDRALAZINE HCL 20 MG/ML IJ SOLN
10.0000 mg | Freq: Once | INTRAMUSCULAR | Status: AC
  Administered 2017-06-12: 10 mg via INTRAVENOUS

## 2017-06-12 MED ORDER — POTASSIUM CHLORIDE CRYS ER 10 MEQ PO TBCR
10.0000 meq | EXTENDED_RELEASE_TABLET | Freq: Every day | ORAL | Status: DC
  Administered 2017-06-12: 10 meq via ORAL
  Filled 2017-06-12: qty 1

## 2017-06-12 MED ORDER — SODIUM CHLORIDE 0.9 % IV SOLN
INTRAVENOUS | Status: DC
  Administered 2017-06-12: 07:00:00 via INTRAVENOUS

## 2017-06-12 MED ORDER — LISINOPRIL 20 MG PO TABS
20.0000 mg | ORAL_TABLET | Freq: Two times a day (BID) | ORAL | Status: DC
  Administered 2017-06-12: 20 mg via ORAL
  Filled 2017-06-12 (×2): qty 1

## 2017-06-12 MED ORDER — ONDANSETRON HCL 4 MG/2ML IJ SOLN: 4.0000 mg | Freq: Four times a day (QID) | INTRAMUSCULAR | Status: DC | PRN

## 2017-06-12 MED ORDER — CARBIDOPA-LEVODOPA 25-100 MG PO TABS
0.5000 | ORAL_TABLET | Freq: Two times a day (BID) | ORAL | Status: DC
  Administered 2017-06-12: 0.5 via ORAL
  Filled 2017-06-12 (×2): qty 0.5

## 2017-06-12 MED ORDER — SODIUM CHLORIDE 0.9 % IV SOLN: 250.0000 mL | INTRAVENOUS | Status: DC | PRN

## 2017-06-12 MED ORDER — SODIUM CHLORIDE 0.9% FLUSH: 3.0000 mL | INTRAVENOUS | Status: DC | PRN

## 2017-06-12 MED ORDER — MIDAZOLAM HCL 5 MG/5ML IJ SOLN
INTRAMUSCULAR | Status: AC
  Filled 2017-06-12: qty 5

## 2017-06-12 MED ORDER — BUPIVACAINE HCL (PF) 0.25 % IJ SOLN
INTRAMUSCULAR | Status: DC | PRN
  Administered 2017-06-12: 60 mL

## 2017-06-12 SURGICAL SUPPLY — 12 items
BAG SNAP BAND KOVER 36X36 (MISCELLANEOUS) ×3 IMPLANT
CATH CELSIUS THERM D CV 7F (ABLATOR) ×3 IMPLANT
CATH HEX JOSEPH 2-5-2 65CM 6F (CATHETERS) ×3 IMPLANT
CATH JOSEPHSON QUAD-ALLRED 6FR (CATHETERS) ×6 IMPLANT
PACK EP LATEX FREE (CUSTOM PROCEDURE TRAY) ×2
PACK EP LF (CUSTOM PROCEDURE TRAY) ×1 IMPLANT
PAD DEFIB LIFELINK (PAD) ×3 IMPLANT
PATCH CARTO3 (PAD) ×3 IMPLANT
SHEATH PINNACLE 6F 10CM (SHEATH) ×6 IMPLANT
SHEATH PINNACLE 7F 10CM (SHEATH) ×3 IMPLANT
SHEATH PINNACLE 8F 10CM (SHEATH) ×3 IMPLANT
SHIELD RADPAD SCOOP 12X17 (MISCELLANEOUS) ×3 IMPLANT

## 2017-06-12 NOTE — Interval H&P Note (Signed)
History and Physical Interval Note:  06/12/2017 7:09 AM  Tacey Heap  has presented today for surgery, with the diagnosis of svt  The various methods of treatment have been discussed with the patient and family. After consideration of risks, benefits and other options for treatment, the patient has consented to  Procedure(s): SVT Ablation (N/A) as a surgical intervention .  The patient's history has been reviewed, patient examined, no change in status, stable for surgery.  I have reviewed the patient's chart and labs.  Questions were answered to the patient's satisfaction.     Cristopher Peru

## 2017-06-12 NOTE — Progress Notes (Signed)
Site area: rt IJ venous sheath Site Prior to Removal:  Level 0 Pressure Applied For: 10 minutes Manual:   yes Patient Status During Pull:  stable Post Pull Site:  Level 0 Post Pull Instructions Given:  yes Post Pull Pulses Present: NA Dressing Applied:  Gauze and tegaderm Bedrest begins @  Comments:   

## 2017-06-12 NOTE — Progress Notes (Signed)
Pt thinks she has a UTI. Says it hurts really bad when she urinates.  Dr Lovena Le paged.

## 2017-06-12 NOTE — Progress Notes (Signed)
Site area: 3 rt fv sheaths Site Prior to Removal:  Level 0 Pressure Applied For:  20 minutes Manual:   yes Patient Status During Pull:  stable Post Pull Site:  Level  0 Post Pull Instructions Given:  yes Post Pull Pulses Present: palpable Dressing Applied:  Gauze and tegaderm Bedrest begins @  3112 Comments:  IV saline locked

## 2017-06-12 NOTE — Discharge Instructions (Signed)
No driving for 4 days. No lifting over 5 lbs for 1 week. No vigorous or sexual activity for 1 week. You may return to work on 06/19/17. Keep procedure site clean & dry. If you notice increased pain, swelling, bleeding or pus, call/return!  You may shower, but no soaking baths/hot tubs/pools for 1 week.     Femoral Site Care Refer to this sheet in the next few weeks. These instructions provide you with information about caring for yourself after your procedure. Your health care provider may also give you more specific instructions. Your treatment has been planned according to current medical practices, but problems sometimes occur. Call your health care provider if you have any problems or questions after your procedure. What can I expect after the procedure? After your procedure, it is typical to have the following:  Bruising at the site that usually fades within 1-2 weeks.  Blood collecting in the tissue (hematoma) that may be painful to the touch. It should usually decrease in size and tenderness within 1-2 weeks.  Follow these instructions at home:  Take medicines only as directed by your health care provider.  You may shower 24-48 hours after the procedure or as directed by your health care provider. Remove the bandage (dressing) and gently wash the site with plain soap and water. Pat the area dry with a clean towel. Do not rub the site, because this may cause bleeding.  Do not take baths, swim, or use a hot tub until your health care provider approves.  Check your insertion site every day for redness, swelling, or drainage.  Do not apply powder or lotion to the site.  Limit use of stairs to twice a day for the first 2-3 days or as directed by your health care provider.  Do not squat for the first 2-3 days or as directed by your health care provider.  Do not lift over 10 lb (4.5 kg) for 5 days after your procedure or as directed by your health care provider.  Ask your health care  provider when it is okay to: ? Return to work or school. ? Resume usual physical activities or sports. ? Resume sexual activity.  Do not drive home if you are discharged the same day as the procedure. Have someone else drive you.  You may drive 24 hours after the procedure unless otherwise instructed by your health care provider.  Do not operate machinery or power tools for 24 hours after the procedure or as directed by your health care provider.  If your procedure was done as an outpatient procedure, which means that you went home the same day as your procedure, a responsible adult should be with you for the first 24 hours after you arrive home.  Keep all follow-up visits as directed by your health care provider. This is important. Contact a health care provider if:  You have a fever.  You have chills.  You have increased bleeding from the site. Hold pressure on the site. Get help right away if:  You have unusual pain at the site.  You have redness, warmth, or swelling at the site.  You have drainage (other than a small amount of blood on the dressing) from the site.  The site is bleeding, and the bleeding does not stop after 30 minutes of holding steady pressure on the site.  Your leg or foot becomes pale, cool, tingly, or numb. This information is not intended to replace advice given to you by your health  care provider. Make sure you discuss any questions you have with your health care provider. Document Released: 04/25/2014 Document Revised: 01/28/2016 Document Reviewed: 03/11/2014 Elsevier Interactive Patient Education  2018 [employer].    Cardiac Ablation Cardiac ablation is a procedure to stop some heart tissue from causing problems. The heart has many electrical connections. Sometimes these connections make the heart beat very fast or irregularly. Removing some problem areas can improve the heart rhythm or make it normal. What happens before the procedure?  Follow  instructions from your doctor about what you cannot eat or drink.  Ask your doctor about: ? Changing or stopping your normal medicines. This is important if you take diabetes medicines or blood thinners. ? Taking medicines such as aspirin and ibuprofen. These medicines can thin your blood. Do not take these medicines before your procedure if your doctor tells you not to.  Plan to have someone take you home.  If you will be going home right after the procedure, plan to have someone with you for 24 hours. What happens during the procedure?  To lower your risk of infection: ? Your health care team will wash or sanitize their hands. ? Your skin will be washed with soap. ? Hair may be removed from your neck or groin.  An IV tube will be put into one of your veins.  You will be given a medicine to help you relax (sedative).  Skin on your neck or groin will be numbed.  A cut (incision) will be made in your neck or groin.  A needle will be put through your cut and into a vein in your neck or groin.  A tube (catheter) will be put into the needle. The tube will be moved to your heart. X-rays (fluoroscopy) will be used to help guide the tube.  Small devices (electrodes) on the tip of the tube will send out electrical currents.  Dye may be put through the tube. This helps your surgeon see your heart.  Electrical energy will be used to scar (ablate) some heart tissue. Your surgeon may use: ? Heat (radiofrequency energy). ? Laser energy. ? Extreme cold (cryoablation).  The tube will be taken out.  Pressure will be held on your cut. This helps stop bleeding.  A bandage (dressing) will be put on your cut. The procedure may vary. What happens after the procedure?  You will be monitored until your medicines have worn off.  Your cut will be watched for bleeding. You will need to lie still for a few hours.  Do not drive for 24 hours or as long as your doctor tells  you. Summary  Cardiac ablation is a procedure to stop some heart tissue from causing problems.  Electrical energy will be used to scar (ablate) some heart tissue. This information is not intended to replace advice given to you by your health care provider. Make sure you discuss any questions you have with your health care provider. Document Released: 04/24/2013 Document Revised: 07/11/2016 Document Reviewed: 07/11/2016 Elsevier Interactive Patient Education  2017 [employer].  Moderate Conscious Sedation, Adult, Care After These instructions provide you with information about caring for yourself after your procedure. Your health care provider may also give you more specific instructions. Your treatment has been planned according to current medical practices, but problems sometimes occur. Call your health care provider if you have any problems or questions after your procedure. What can I expect after the procedure? After your procedure, it is common:  To  feel sleepy for several hours.  To feel clumsy and have poor balance for several hours.  To have poor judgment for several hours.  To vomit if you eat too soon.  Follow these instructions at home: For at least 24 hours after the procedure:   Do not: ? Participate in activities where you could fall or become injured. ? Drive. ? Use heavy machinery. ? Drink alcohol. ? Take sleeping pills or medicines that cause drowsiness. ? Make important decisions or sign legal documents. ? Take care of children on your own.  Rest. Eating and drinking  Follow the diet recommended by your health care provider.  If you vomit: ? Drink water, juice, or soup when you can drink without vomiting. ? Make sure you have little or no nausea before eating solid foods. General instructions  Have a responsible adult stay with you until you are awake and alert.  Take over-the-counter and prescription medicines only as told by your health care  provider.  If you smoke, do not smoke without supervision.  Keep all follow-up visits as told by your health care provider. This is important. Contact a health care provider if:  You keep feeling nauseous or you keep vomiting.  You feel light-headed.  You develop a rash.  You have a fever. Get help right away if:  You have trouble breathing. This information is not intended to replace advice given to you by your health care provider. Make sure you discuss any questions you have with your health care provider. Document Released: 06/12/2013 Document Revised: 01/25/2016 Document Reviewed: 12/12/2015 Elsevier Interactive Patient Education  [employer].

## 2017-06-12 NOTE — Progress Notes (Signed)
Wheezing has lessoned

## 2017-06-12 NOTE — Progress Notes (Signed)
The patient is seen post procedure as well as by Dr. Lovena Le No c/o CP or SOB, good urine OP post lasix C/w c/o som pressure bladder with urination, no buring and none otherwise Will rx Cipro 500mg  BID the patient has been instructed to see her PMD this week to follow given fairly recent treatment for UTI, she remains afebrile No procedure site pain  No active wheezing R IJ and R groin sites are dry, no bleeding, hematoma, are nontender  Site care and activity restrictions were discussed with the patient No changes to her home medicines Follow up is in place  Tommye Standard, [place]  EP attending  Patient seen and examined. 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 Her dyspnea is much improved. She will be treated with antibiotics for presumed urinary tract infection. She is encouraged to seek follow-up with her medical doctor. Her incision demonstrates no hematoma or bleeding. She will be discharged home with usual follow-up.  Cristopher Peru, M.D.

## 2017-06-12 NOTE — H&P (View-Only) (Signed)
HPI [name] is referred today by Dr. Wynonia Lawman for evaluation of SVT. 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 Until the last couple of years, she was able to stop her episodes with vagal maneuvers or Valsalva. The patient has suddenly had non-ST elevation MIs with her prolonged episodes of SVT. These episodes start and stop suddenly. They are associated with shortness of breath and chest pressure. She has had troponin elevations up to 10. Heart catheterization demonstrated mostly nonobstructive coronary disease but she does have occluded second marginal and 75% RCA stenoses. She has been on calcium channel blockers for prevention of her SVT but has had multiple breakthroughs despite medical therapy. Allergies  Allergen Reactions  . Amoxicillin Anaphylaxis, Swelling, Rash and Other (See Comments)    Has patient had a PCN reaction causing immediate rash, facial/tongue/throat swelling, SOB or lightheadedness with hypotension: Yes PCN reaction causing SEVERE RASH INVOLVING MUCUS MEMBRANES or SKIN NECROSIS: Yes Has patient had a PCN reaction that required hospitalization: No Has patient had a PCN reaction occurring within the last 10 years: Yes   . Prednisone Shortness Of Breath, Nausea Only and Other (See Comments)    Hypertension   . Amlodipine Itching and Other (See Comments)    BURNING  . Levothyroxine Other (See Comments)    Tired, shaky, tremors  . Meloxicam Swelling and Other (See Comments)    "Burning "  . Nebivolol Rash  . Nsaids Nausea And Vomiting  . Valsartan Itching     Current Outpatient Prescriptions  Medication Sig Dispense Refill  . albuterol (PROVENTIL HFA;VENTOLIN HFA) 108 (90 Base) MCG/ACT inhaler Inhale 1 puff into the lungs every 6 (six) hours as needed for wheezing or shortness of breath.    Marland Kitchen aspirin EC 81 MG tablet Take 81 mg by mouth daily.    Marland Kitchen atorvastatin (LIPITOR) 40 MG tablet Take 1 tablet (40 mg  total) by mouth daily at 6 PM. 30 tablet 0  . carbidopa-levodopa (SINEMET IR) 25-100 MG tablet Take 1 tablet by mouth at 7 am, 1/2 tablet at 11 am, 1 tablet at 3 pm and 1/2 tablet at 7 pm    . Fluticasone Furoate (ARNUITY ELLIPTA) 100 MCG/ACT AEPB Inhale 1 puff into the lungs daily.     . furosemide (LASIX) 20 MG tablet Take 1 tablet (20 mg total) by mouth daily. 30 tablet 0  . hydrALAZINE (APRESOLINE) 50 MG tablet Take 25 mg by mouth 2 (two) times daily.    Marland Kitchen lisinopril (PRINIVIL,ZESTRIL) 20 MG tablet Take 1 tablet (20 mg total) by mouth 2 (two) times daily. 30 tablet 0  . potassium chloride (K-DUR,KLOR-CON) 10 MEQ tablet Take 10 mEq by mouth daily.     . verapamil (CALAN) 80 MG tablet Take 1 tablet (80 mg total) by mouth every 12 (twelve) hours. 60 tablet 0   No current facility-administered medications for this visit.      Past Medical History:  Diagnosis Date  . Anemia   . Anxiety    takes Klonopin as bedtime  . Asthma   . CHF (congestive heart failure) ([place])   . Depression    takes Citalopram daily  . Gout   . History of blood transfusion    "w/hysterectomy" (04/17/2017)  . Hypertension    takes Verapamil daily  . Hypothyroidism   . NSTEMI (non-ST elevated myocardial infarction) ([place]) 04/17/2017  . OSA on CPAP     "sitting  on 5" (04/17/2017)  . Overactive bladder   . Parkinson's disease ([place])    takes Sinemet daily  . Peripheral edema    take Lasix as needed  . Pneumonia    "when I was a kid" (04/17/2017)  . Seasonal allergies   . Skin cancer    left elbow" (04/17/2017)  . Stroke [facility])    "weaker on my right side since; he last one I know of was in 2014" (04/17/2017)  . Tachycardia   . Urinary incontinence     ROS:   All systems reviewed and negative except as noted in the HPI.   Past Surgical History:  Procedure Laterality Date  . ABDOMINAL HYSTERECTOMY    . CESAREAN SECTION  1976  . LEFT HEART CATH AND CORONARY ANGIOGRAPHY N/A 04/18/2017   Procedure: LEFT  HEART CATH AND CORONARY ANGIOGRAPHY;  Surgeon: Jettie Booze, MD;  Location: [place] [facility];  Service: Cardiovascular;  Laterality: N/A;  . MINOR PLACEMENT OF FIDUCIAL N/A 11/22/2016   Procedure: Fiducial placement;  Surgeon: Erline Levine, MD;  Location: [place];  Service: Neurosurgery;  Laterality: N/A;  Fiducial placement  . PULSE GENERATOR IMPLANT Right 12/06/2016   Procedure: Unilateral PULSE GENERATOR IMPLANT;  Surgeon: Erline Levine, MD;  Location: [place];  Service: Neurosurgery;  Laterality: Right;  Unilateral PULSE GENERATOR IMPLANT  . RADIOLOGY WITH ANESTHESIA N/A 09/13/2016   Procedure: MRI BRAIN WITH AND WITHOUT;  Surgeon: Medication Radiologist, MD;  Location: [place];  Service: Radiology;  Laterality: N/A;  . SKIN CANCER EXCISION Left    points to elbow  . SUBTHALAMIC STIMULATOR INSERTION Bilateral 11/29/2016   Procedure: Bilateral Deep brain stimulator placement;  Surgeon: Erline Levine, MD;  Location: [place];  Service: Neurosurgery;  Laterality: Bilateral;  Bilateral Deep brain stimulator placement  . THYROIDECTOMY, PARTIAL    . TONSILLECTOMY    . TUBAL LIGATION    . UTERINE FIBROID SURGERY       Family History  Problem Relation Age of Onset  . Heart disease Father      Social History   Social History  . Marital status: Widowed    Spouse name: N/A  . Number of children: N/A  . Years of education: N/A   Occupational History  . retired    Social History Main Topics  . Smoking status: Former Smoker    Quit date: 09/27/2012  . Smokeless tobacco: Never Used  . Alcohol use 0.0 oz/week     Comment: 04/17/2017 "nothing since the 1980s"  . Drug use: No  . Sexual activity: No   Other Topics Concern  . Not on file   Social History Narrative  . No narrative on file     BP 128/84   Pulse (!) 58   Ht 5\' 3"  (1.6 m)   Wt 159 lb 3.2 oz (72.2 kg)   SpO2 99%   BMI 28.20 kg/m   Physical Exam:  Well appearing 71 year old woman, NAD HEENT: Unremarkable Neck:  7 cm  JVD, no thyromegally Lymphatics:  No adenopathy Back:  No CVA tenderness Lungs:  Clear, with no wheezes, rales, or rhonchi HEART:  Regular rate rhythm, no murmurs, no rubs, no clicks Abd:  soft, positive bowel sounds, no organomegally, no rebound, no guarding Ext:  2 plus pulses, no edema, no cyanosis, no clubbing Skin:  No rashes no nodules Neuro:  CN II through XII intact, motor grossly intact  EKG - reviewed. Normal sinus rhythm with no ventricular preexcitation ECG during SVT  demonstrates a narrow complex short RP tachycardia.  Assess/Plan: 1. Recurrent medically refractory SVT - I've discussed the treatment options with the patient and her sister. The risk, goals, benefits, and expectations of catheter ablation were reviewed and she wishes to proceed. This will be scheduled as soon as possible. 2. Coronary artery disease -she will continue her medical therapy. Hopefully when her SVT is controlled, her symptoms will be also well-controlled. 3. Dyslipidemia - she will continue atorvastatin.   Cristopher Peru, M.D.

## 2017-06-13 ENCOUNTER — Encounter (HOSPITAL_COMMUNITY): Payer: Self-pay | Admitting: Internal Medicine

## 2017-07-08 ENCOUNTER — Telehealth: Payer: Self-pay | Admitting: Cardiology

## 2017-07-08 NOTE — Telephone Encounter (Signed)
Pt's sister called stating that she has been having intermittent episodes of feeling like her HR is racing. Weak at times. Recent SVT ablation. Currently patient is feeling ok, HR 70s, blood pressure 795L systolic. Instructed I would send message to attempt to arrange sooner f/u in the office. Scheduled now for 11/13. ER precautions given. Sister understood and agreeable to plan.   Reino Bellis NP

## 2017-07-18 ENCOUNTER — Encounter: Payer: Self-pay | Admitting: Internal Medicine

## 2017-07-18 ENCOUNTER — Ambulatory Visit (INDEPENDENT_AMBULATORY_CARE_PROVIDER_SITE_OTHER): Payer: PRIVATE HEALTH INSURANCE | Admitting: Internal Medicine

## 2017-07-18 VITALS — BP 120/84 | Ht 63.0 in | Wt 169.0 lb

## 2017-07-18 DIAGNOSIS — I471 Supraventricular tachycardia: Secondary | ICD-10-CM

## 2017-07-18 MED ORDER — FLECAINIDE ACETATE 50 MG PO TABS: 50.0000 mg | ORAL_TABLET | Freq: Two times a day (BID) | ORAL | 11 refills | Status: DC

## 2017-07-18 MED ORDER — METOPROLOL SUCCINATE ER 25 MG PO TB24: 25.0000 mg | ORAL_TABLET | Freq: Every day | ORAL | 11 refills | Status: DC

## 2017-07-18 NOTE — Addendum Note (Signed)
Addended by: Willeen Cass A on: 07/18/2017 11:15 AM   Modules accepted: Orders

## 2017-07-18 NOTE — Progress Notes (Signed)
HPI Ms. Babson returns today for followup. She is a pleasant 71 yo woman with SVT who underwent EPS/RFA of AVNRT several weeks ago. She has not had any sustained heart racing but does feel palpitations which come and go and are symptomatic. No chest pain or sob. No syncope. Allergies  Allergen Reactions  . Amoxicillin Anaphylaxis, Swelling, Rash and Other (See Comments)    Has patient had a PCN reaction causing immediate rash, facial/tongue/throat swelling, SOB or lightheadedness with hypotension: Yes PCN reaction causing SEVERE RASH INVOLVING MUCUS MEMBRANES or SKIN NECROSIS: Yes Has patient had a PCN reaction that required hospitalization: No Has patient had a PCN reaction occurring within the last 10 years: Yes   . Prednisone Shortness Of Breath, Nausea Only and Other (See Comments)    Hypertension   . Amlodipine Itching and Other (See Comments)    BURNING  . Levothyroxine Other (See Comments)    Tired, shaky, tremors  . Meloxicam Swelling and Other (See Comments)    "Burning "  . Nebivolol Rash  . Nsaids Nausea And Vomiting  . Valsartan Itching     Current Outpatient Medications  Medication Sig Dispense Refill  . albuterol (PROVENTIL HFA;VENTOLIN HFA) 108 (90 Base) MCG/ACT inhaler Inhale 1 puff into the lungs every 6 (six) hours as needed for wheezing or shortness of breath.    Marland Kitchen aspirin EC 81 MG tablet Take 81 mg by mouth daily.    Marland Kitchen atorvastatin (LIPITOR) 40 MG tablet Take 1 tablet (40 mg total) by mouth daily at 6 PM. (Patient taking differently: Take 40 mg by mouth daily at 6 PM. 1900) 30 tablet 0  . carbidopa-levodopa (SINEMET IR) 25-100 MG tablet Take 0.5-1 tablets by mouth 4 (four) times daily. Take 1 tablet by mouth at 7 am, 1/2 tablet at 11 am, 1 tablet at 3 pm and 1/2 tablet at 7 pm     . Fluticasone Furoate (ARNUITY ELLIPTA) 100 MCG/ACT AEPB Inhale 1 puff into the lungs daily.     . furosemide (LASIX) 20 MG tablet Take 1 tablet (20 mg total) by mouth daily. 30  tablet 0  . hydrALAZINE (APRESOLINE) 50 MG tablet Take 25 mg by mouth 2 (two) times daily.    Marland Kitchen lisinopril (PRINIVIL,ZESTRIL) 20 MG tablet Take 1 tablet (20 mg total) by mouth 2 (two) times daily. 30 tablet 0  . potassium chloride (K-DUR,KLOR-CON) 10 MEQ tablet Take 10 mEq by mouth daily.      No current facility-administered medications for this visit.      Past Medical History:  Diagnosis Date  . Anemia   . Anxiety    takes Klonopin as bedtime  . Asthma   . CHF (congestive heart failure) ([place])   . Depression    takes Citalopram daily  . Gout   . History of blood transfusion    "w/hysterectomy" (04/17/2017)  . Hypertension    takes Verapamil daily  . Hypothyroidism   . NSTEMI (non-ST elevated myocardial infarction) ([place]) 04/17/2017  . OSA on CPAP     "sitting on 5" (04/17/2017)  . Overactive bladder   . Parkinson's disease ([place])    takes Sinemet daily  . Peripheral edema    take Lasix as needed  . Pneumonia    "when I was a kid" (04/17/2017)  . Seasonal allergies   . Skin cancer    left elbow" (04/17/2017)  . Stroke [facility])    "weaker on my right side since; he last one  I know of was in 2014" (04/17/2017)  . Tachycardia   . Urinary incontinence     ROS:   All systems reviewed and negative except as noted in the HPI.   Past Surgical History:  Procedure Laterality Date  . ABDOMINAL HYSTERECTOMY    . CESAREAN SECTION  1976  . SKIN CANCER EXCISION Left    points to elbow  . THYROIDECTOMY, PARTIAL    . TONSILLECTOMY    . TUBAL LIGATION    . UTERINE FIBROID SURGERY       Family History  Problem Relation Age of Onset  . Heart disease Father      Social History   Socioeconomic History  . Marital status: Widowed    Spouse name: Not on file  . Number of children: Not on file  . Years of education: Not on file  . Highest education level: Not on file  Social Needs  . Financial resource strain: Not on file  . Food insecurity - worry: Not on file  . Food  insecurity - inability: Not on file  . Transportation needs - medical: Not on file  . Transportation needs - non-medical: Not on file  Occupational History  . Occupation: retired  Tobacco Use  . Smoking status: Former Smoker    Last attempt to quit: 09/27/2012    Years since quitting: 4.8  . Smokeless tobacco: Never Used  Substance and Sexual Activity  . Alcohol use: Yes    Alcohol/week: 0.0 oz    Comment: 04/17/2017 "nothing since the 1980s"  . Drug use: No  . Sexual activity: No  Other Topics Concern  . Not on file  Social History Narrative  . Not on file     BP 120/84   Ht 5\' 3"  (1.6 m)   Wt 169 lb (76.7 kg)   SpO2 97%   BMI 29.94 kg/m   Physical Exam:  Well appearing 71 yo woman, NAD HEENT: Unremarkable Neck:  6 cm JVD, no thyromegally Lymphatics:  No adenopathy Back:  No CVA tenderness Lungs:  Clear with no wheezes HEART:  IRegular rate rhythm, no murmurs, no rubs, no clicks Abd:  soft, positive bowel sounds, no organomegally, no rebound, no guarding Ext:  2 plus pulses, no edema, no cyanosis, no clubbing Skin:  No rashes no nodules Neuro:  CN II through XII intact, motor grossly intact  EKG - NSR with noise artifact  Assess/Plan: 1. SVT - she is s/p ablation and has had no recurrent heart racing 2. Palpitations - she has checked her HR on the BP machine and her heart is not racing when she has her symptoms which strongly suggests PAC's or PVC's. I discussed the benign nature of her symptoms. As she is severely symptomatic, I have offered her flecainide 50 mg twice daily. If she decides to take the medication then we will ask her to come back for an ECG. I will see her back in 3 months. 3. HTN - her blood pressure is controlled. Will follow. 4. CAD - she has preserved LV function. I might have to hold off on my plan above to have her take flecainide. We might consider a beta blocker instead.  Mikle Bosworth.D.

## 2017-07-18 NOTE — Patient Instructions (Addendum)
Medication Instructions:  Your physician has recommended you make the following change in your medication:  1.  Start taking flecainide 50 mg (one tablet) by mouth twice a day.  If you start taking the flecainide, please call me to set up a follow up appointment for an EKG.    Sonia Baller RN [phone]  CORRECTED IN WRITING FOR PT.  NO FLECAINIDE.  NO EKG.   START TOPROL XL 25 MG BY MOUTH DAILY. CALL IF WILL START TOPROL.  Labwork: None ordered.  Testing/Procedures: If you start taking the flecainide, we will need you to come back to the office in 7 days for a repeat EKG.    Follow-Up: Your physician wants you to follow-up in 3 months with Dr. Lovena Le.    Any Other Special Instructions Will Be Listed Below (If Applicable).     If you need a refill on your cardiac medications before your next appointment, please call your pharmacy.

## 2017-07-20 ENCOUNTER — Telehealth: Payer: Self-pay

## 2017-07-20 NOTE — Telephone Encounter (Signed)
Call received from Pt's daughter.  Pt to start taking Toprol XL 25 mg 07/21/2017.  Advised daughter to check Pt BP in AM before taking BP meds.  If systolic BP greater than 935 Pt can take all her BP meds.  If systolic <521 have Pt hold hydralazine but take Toprol XL and lisinopril.  Advised to take BP before taking PM hydralazine and lisinopril.  Asked daughter to record BP's and what meds given.    Will call on 07/25/2017 to check in.  May need to make med adjustments-will discuss with Dr. Lovena Le.  Will cont to monitor.

## 2017-07-25 NOTE — Telephone Encounter (Signed)
Call placed to daughter.  Per daughter Pt has an infection and blood pressures are running high.  Pt started on antibiotic today.  Will call back next week to check in with family.  Daughter indicates understanding.  PCP managing infection/blood pressures at this time.

## 2017-08-01 NOTE — Telephone Encounter (Signed)
Daughter calling to give update on mother.  Mother recently had her hydralazine increased d/t her BP running very high with current infection.  Pt now taking hydralazine 50 mg TID.  Per daughter recent increase in hydralazine has decreased Pt feeling of heart skipping beats.  Pt continues to be on antibiotics for infection.  Thanked daughter for update, will continue to monitor until infection gone and ensure BP stable.

## 2017-08-10 NOTE — Telephone Encounter (Signed)
Per daughter, Pt blood pressure seems to be stabilizing.  Daughter was concerned that patient HR will drop into the 50's.  Advised daughter if Pt is not symptomatic with HR's in the 83's she should be ok.  If she becomes dizzy or has syncope she should call office and Pt medications may need to be reviewed.  Daughter indicates understanding.  Pt with f/u in February.  Advised daughter to call office if Pt needs to be seen sooner.  No further action needed at this time.

## 2017-08-16 ENCOUNTER — Telehealth: Payer: Self-pay | Admitting: Neurology

## 2017-08-16 ENCOUNTER — Encounter: Payer: No Typology Code available for payment source | Admitting: Neurology

## 2017-08-16 NOTE — Telephone Encounter (Signed)
Ana Salazar aware and will attend appt on 09/12/17.

## 2017-08-16 NOTE — Telephone Encounter (Signed)
-----   Message from [place], DO sent at 08/15/2017  2:59 PM EST ----- When she is r/s, she will need to schedule in conjunction with Lennette Bihari, [employer] rep.  Maybe I will do next wed at 1 if cannot find anywhere else

## 2017-08-17 ENCOUNTER — Telehealth: Payer: Self-pay | Admitting: Neurology

## 2017-08-17 NOTE — Telephone Encounter (Signed)
It says "historical provider" so I believe this was probably PCP. Should they ask them about medication?  I can definitely ask about counseling/psychiatry. No SI/HI.

## 2017-08-17 NOTE — Telephone Encounter (Signed)
Left message on machine for patient's daughter to call back.   

## 2017-08-17 NOTE — Telephone Encounter (Signed)
Tiffany with [facility] left a voicemail message regarding pt and would like a call back CB# [phone] Ext:[phone]

## 2017-08-17 NOTE — Telephone Encounter (Signed)
Who was the original prescriber of the citalopram?  Does she have SI/HI?  I think that not only psychiatry but also counseling would be good for her.  Would she go?

## 2017-08-17 NOTE — Telephone Encounter (Addendum)
Spoke with patient's daughter Alyse Low (on [place]) and she is worried about patient. She states she thinks she is depressed. She doesn't think any SI. She used to be on Citalopram, and she is asking if it could be restarted or we could try something else. She is unsure why this was stopped.   She states patient doesn't want to do anything. She sits at home. They have her in a day center program to try to get her out and socializing, but she hates it and doesn't want to do anything. Her legs are weak, but she states patient just sits all day and doesn't exercise or have any desire to do anything. She is now making comments like, "guess it's time for me to get sent to a nursing home," when she never talked like that before.   Please advise.

## 2017-08-17 NOTE — Telephone Encounter (Signed)
I would prefer that.  I don't usually manage depression

## 2017-08-17 NOTE — Telephone Encounter (Signed)
Verified future appt date/time so they can set up transport.

## 2017-08-18 NOTE — Telephone Encounter (Signed)
Patient's daughter called back. Made her aware of Dr. Doristine Devoid advise. They will call PCP.  She will discuss counseling/psychiatry with her mother and call back if they need resources or suggestions.

## 2017-08-21 ENCOUNTER — Telehealth: Payer: Self-pay | Admitting: Neurology

## 2017-08-21 NOTE — Telephone Encounter (Signed)
Patient's daughter states patient moving a lot slower, feeling weaker, and wants to know if she can increase her medication (Levodopa). Made her aware it will not help weakness, but thinks she is feeling weak because she is moving so slow. She is currently taking Levodopa as follows: 1 tablet at 7 am, 1/2 tablet at 11 am, 1 tablet at 3 pm, and 1/2 tablet at 7 pm.  Please advise.

## 2017-08-21 NOTE — Telephone Encounter (Signed)
Patient's daughter made aware. She will let me know if the day program needs an order for this new dose.

## 2017-08-21 NOTE — Telephone Encounter (Signed)
Patient's daughter called and would like to speak with you regarding the Carbidopa levodopa medication and it being increased? Please Call. Thanks

## 2017-08-21 NOTE — Telephone Encounter (Signed)
Can try 1 at 7am/1 at 11am/1 at 3 pm and continue 1/2 at 7pm.  If she is having dyskinesia, however, that is how they know that she doesn't need more

## 2017-08-21 NOTE — Telephone Encounter (Signed)
Patient's daughter called back wanting to know can the appointmentt be made later in the day or on another day?

## 2017-09-08 NOTE — Progress Notes (Signed)
u   Ana Salazar was seen today in the movement disorders clinic for neurologic consultation at the request of System, Pcp Not In.   The patient presents today for a second opinion regarding Parkinson's disease. She is accompanied by her daughter and sister who supplement the history.   I have reviewed an extensive number of her records from her prior neurologist, [name], and I appreciate those records.  The patient presented to [name] first in November, 2014, but reported that she had had symptoms for about a year and a half prior to that.  Her first symptoms consisted of difficulty moving her feet/shuffling the feet; trouble getting out of the bed; tremor (she thinks that it started in both but does state that the right is worse than the left and always has been).  The patient was started on a trial of levodopa at her first visit with [name] and followed up with him the next month and noted great improvement with the medication.  She was started on pramipexole the following visit but noted that it made her dizzy and it was discontinued over the telephone.  The following visit, she was started on entacapone 200 mg 3 times a day.  In April, 2015 her carbidopa/levodopa 25/100 was increased to 2 tablets 3 times per day.  In July, 2016 she was changed to carbidopa/levodopa 50/200, 3 times per day.  In October, 2016 she was placed on a combination of carbidopa/levodopa 50/200, 3 times a day (4-5am, noon, 8pm) in addition to carbidopa/levodopa 25/100, 2 tablets 3 times per day (1200 mg of levodopa per day).  She remains on the entacapone, 200 mg 3 times per day as well as primidone, 50 mg - 1.5 tablets at night, which she is using for tremor control.  She reports that her biggest frustration is that she is always moving and is more dizzy and is starting to have a few falls.  When she is nearing end of dose (1-2 hours prior) she will have tremor in her stomach, followed by her legs and arms.    10/13/15  update:  The patient is following care today, primarily for levodopa challenge.  She is currently off of medication.  She is accompanied by her daughter (different daughter than last visit) who supplements the history.  She last took her medication at 8pm.  Her medication generally consists of carbidopa/levodopa 50/200 3 times per day in addition to carbidopa/levodopa 25/100, 1 tablet 6 times per day.  Splitting the medication did help somewhat, but she thinks that it is not helping as much as it does initially when she first started doing it that way.  She is also on entacapone 200 mg 3 times per day and primidone 75 mg at night.  She has not had any falls since our last visit.  01/12/16 update:  The patient is following up today, as she called me to ask me about potentially decreasing her levodopa. She is accompanied by her sister who supplements the history.   She is on a very large dosage and somewhat of a strange combination, but came to me on this combination.  She is currently taking carbidopa/levodopa 50/200 3 times a day (5am/noon/8pm) and is now spreading out her carbidopa/levodopa 25/100 to one tablet 6 times per day (5am is the start and she takes that q 3 hrs until 8 pm).  She remains on entacapone 200 mg 3 times a day and primidone 75 mg daily.  She goes to  bed at about 8pm.  She has found that she is having more dyskinesia.  She has been working with rehabilitation therapist and I have gotten a couple of correspondence is from them that they have been concerned about labored breathing.   I advised follow-up with her primary care physician if this was true shortness of breath.  She did say that she wore a holter monitor for a month and that eval was negative.  Was told if continues needs pulm.  Only SOB when she is hot or exerting herself.  He also mentioned that they were concerned about inability to track inferiorly.  She did see [name] for her neuropsych testing, done on 12/22/2015.  She got  feedback from them on 01/05/2016.  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  I specifically spoke with [name], and she did not feel there was evidence of an atypical state from her testing.  She felt that she could potentially be a good DBS candidate.  She did recommend a vision evaluation, medication to help with anxiety/sleep/irritability, and a follow-up regarding a history of sleep apnea.  Pt states that she was already to the eye doctor in December at [facility].    02/02/16 update:  The patient is following up today, earlier than expected.  She is accompanied by her sister who supplements the history.  Last visit I changed her daytime extended release levodopa all over 2 immediate release.  Therefore, she was taking carbidopa/levodopa 25/100 as follows: 2 at 5am (she is waking up to take that pill and goes to bed until 7am/1 at 8am/1 at 11 am//2 at 2 pm/1 at 5pm and then would take an additional levodopa/levodopa 50/200 at 8pm.  This dropped her overall load of levodopa from 1200 mg to 900 mg, primarily because she was complaining about dyskinesia.  She called me on May 22 and stated that she was doing well initially, but now feels more weak and stiff.  When I asked her specifically, she admitted that she was being treated for urinary tract infection.  I told her to give that some time to resolve, because that can worsen the symptoms of Parkinson's.  She called the following day and requested a follow-up appointment here, which is the reason for follow-up.  She states that she is weaker, which means that the legs are "tired."  She denies freezing.  She states that overall she is more tired.  No falls.  She states that the sx's do not wax and wane throughout the day.  Her sister thinks that she is doing better but sister does state that she is "starting to get that shuffle."  Last PT was in mid April.  C/o nose dripping for years.    05/04/16 update:  The patient follows up today,  accompanied by her sister who supplements the history.  Last visit, the patient wanted to decrease her levodopa load, primarily because of dyskinesia.  We ended up reworking it significantly.  I changed her from the CR formulation to the immediate release formulation and did drop the overall load somewhat.  She is currently taking carbidopa/levodopa 25/100, 2 tablets at 7 AM/2 tablet at 10 AM/2 tablets at 1 PM/1 tablet at 4 PM/carbidopa/levodopa 50/200 at bedtime (8PM).  She cut out the 7pm dosage.    She takes entacapone 3 times per day, which turned out to be every other dose of levodopa.  She is still on primidone, but only on 50 mg  daily, which she was on prior to coming to see me.  She initally states that she isn't better but then states that dyskinesia is "a lot better than it was" which was the primary issue for changing.   She has had several falls that she attributes to looking down when she walks or getting dizzy when she first gets up.  Was able to get off of clonidine and losartan per PCP because of dizziness and that helped dizziness.  She hasn't gotten hurt with falls.   The big issue now is trouble sleeping.  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  She states that she was only using one time per day.  She also told me last visit that she was getting ready to have a sleep study for objective sleep apnea syndrome and a pulmonary consult for shortness of breath.  I do not have that information but she tells me that she does have osas and needs cpap titration and is awaiting that.  Denies depression but daughter thinks that she has it.  Pt doesn't have much to do during the day.  06/06/16 update: The patient follows up today, accompanied by her sister and daughter who supplement the history.  She is currently taking carbidopa/levodopa 25/100, 2 tablets at 7 AM/2 tablet at 10 AM/2 tablets at 1 PM/1 tablet at 4  PM/carbidopa/levodopa 50/200 at bedtime (8PM).  She is on entacapone 200 mg 3 times per day.  States that "my legs are really tired." Thinks that this is mostly in middle of the day/early AM and then "I spend the entire day trying to catch up."   Finds that if she takes an extra carbidopa/levodopa 25/100 in the middle of the night she does better.  Last visit, I discontinued her primidone, 50 mg daily.  She was complaining about vasomotor rhinorrhea last visit, and I told her to increase her atropine drops to 3 times a day as needed.  She states that she hasn't do that.  I started remeron last visit for depression and sleep.  Is still on celexa.  Remeron definitely helped sleep but got CPAP since our last visit as well.  Sister states that she is using that and is now screaming and hollaring and hitting the walls at night, but seems this was going on before CPAP.    07/21/16 update:  Pt returns again for levodopa challenge test.  2 of her daughters accompany her and supplement the history.  She is on carbidopa/levodopa 25/100, 2 tablets at 7 AM/2 tablet at 10 AM//2 tablets at 1 PM/1 tablet at 4 PM and then she will decide if she needs her last one tablet at 7 PM.  She also takes carbidopa/levodopa  50/200 at bedtime, which is 8 PM.  She is on comtan 200 mg tid.  She has been off of all PD med for about 60 hrs for this test.  She does feel more slowly and stiff, although she does feel less dizzy getting off of the medication.  10/25/16 update:  Pt returns today for pre-op visit.  She is accompanied by her daughter who supplements the history.  She is scheduled for DBS surgery next month.  She is on carbidopa/levodopa 25/100, 2/2/2/1 and sometimes will take another in middle of the night.  She takes carbidopa/levodopa 50/200 at bedtime.  She takes entacapone with 3 of the daytime dosages of levodopa.  She has had  some falls because not using her walker, often at night per daughter.  She is loving Press [employer].  Patient seen today in follow-up.  01/02/17 update:  Patient seen today in follow-up.  She is accompanied by her daughters who supplement the history.  She is status post bilateral STN DBS on 11/29/2016.  She had her generator placed on 12/07/2016.  She was in the hospital between the 2 surgeries with complaints of falls.  I had talked to Dr. Vertell Limber.  The patient had refused to go to subacute nursing facility for rehabilitation.  She had apparently also been refusing to use her walker.  We did get home health involved.  She has had a few falls since home health.  She fell on her butt this weekend and "it was the dogs fault."  Few other falls related to letting go of the walker.  Patient does think some of the falls were related to dizziness.  Her primary care physician has just cut back on her verapamil.  Some of issues that were being reported were due to family stress between patient and her sister.  She is now back on carbidopa/levodopa 25/100, 2/2/2/1 and entacapone with 3 of the dosages.  She is on carbidopa/levodopa 50/200 at bedtime.  She last took her PD med at [place] has not been great.  Family having difficulty understanding her.  Hasn't been doing ST with home health.  01/17/17 update:  Patient seen today in follow-up.  I activated her device on 01/02/2017.  She is currently on carbidopa/levodopa 25/100, 2 tablets at 7, 1 tablet at 10, 1 tablet at 1pm, 1 tablet at 4pm and she has d/c the carbidopa/levodopa 50/200 at night.  I discontinued her entacapone last visit.  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  She has been having some dyskinesia mostly with the L leg.  She is not stable and has had some falls in the house.  She has never had a fall with the walker but has had a fall without the walker.  Speech therapy just started last week.    02/03/17 update:  Patient seen today in follow-up.  Last visit, I decreased her medication and told her to  take carbidopa/levodopa 25/100, 1 tablets at 7am, 0.5 tablets at 11am, 3pm, 7pm.  While dyskinesia is better, she has had multiple falls.  With all of the falls, she has let go of the walker for some reason (getting out of shower, making a sandwich).  "I'm taking the walker more than I did."  She has never fallen when actually holding on to the walker.  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  05/23/17 update: patient seen today in follow-up for her Parkinson's disease.  She is accompanied by her daughter and sister who supplements the history.  Much has happened since our last visit.  I have reviewed hospital records made available to me.  She was admitted to the hospital on 04/17/2017 and discharged on 04/19/2017 with NSTEMI.  Hospital discharge records indicate that she was supposed to follow-up with Dr. Wynonia Lawman 2 weeks after discharge, but I don't see any appointment that she has attended, nor do I see an appointment made in the future.  They tell me that they have an appointment today.  She was given information for the adult daycare program.  She states that they have trouble getting her meds  ontime but the therapy helped.  It has also produced a great schedule for her, which has helped per family but patient doesn't like it.  Pt would like to return to RSB as she feels weak. In regards to Parkinson's disease, she is on carbidopa/levodopa 25/100, one tablet at 7 AM and half a tablet at 11 AM/ on full tablet at 3 PM/ half tablet at 7 PM.  She is noting intermittent right arm tremor.  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    09/12/17 update: Patient seen today in follow-up for Parkinson's disease.  She is accompanied by her her daughter who supplements the history.  The patient is on carbidopa/levodopa 25/100, 1 tablet at 7 AM, 1 tablet at 11 AM, 1 tablet at 3 PM, half a tablet at 7 PM.  She remains on clonazepam 0.5 mg, half  tablet at night for REM behavior disorder.  I have reviewed records since her last visit.  She saw Dr. Wynonia Lawman the same day she saw me last visit.  She has had a long history of SVT.  He wanted her to see an electrophysiologist for consideration of possible ablation versus pacemaker.  She had a cardiac ablation on June 12, 2017 with Dr. Lovena Le.  Following that, it appears that she was having systolic PVCs or PACs and Toprol was prescribed.  She was given parameters.  She was told if systolic <025  hold hydralazine but take Toprol XL and lisinopril.  She was also advised to take her blood pressure before taking her evening hydralazine and lisinopril. she has had falls since our last visit.  They are happening about 1 time per week.  She feels it is due to "lightheadedness and my knees giving way."  Not related to timing of medication.  Always has walker.  Has not had PT but then states that she is doing some therapy at [place] [facility] [place].  Going to RSB one day a week, but daughter states that she is giving excuses to stop going to that (such as stating that she has discharged her DBS system).  Trouble with insomnia but also just "laying around" when at home and not engaging in activities.  Complains of drooling, which has become excessive.     ALLERGIES:   Allergies  Allergen Reactions  . Amoxicillin Anaphylaxis, Swelling, Rash and Other (See Comments)    Has patient had a PCN reaction causing immediate rash, facial/tongue/throat swelling, SOB or lightheadedness with hypotension: Yes PCN reaction causing SEVERE RASH INVOLVING MUCUS MEMBRANES or SKIN NECROSIS: Yes Has patient had a PCN reaction that required hospitalization: No Has patient had a PCN reaction occurring within the last 10 years: Yes   . Prednisone Shortness Of Breath, Nausea Only and Other (See Comments)    Hypertension   . Amlodipine Itching and Other (See Comments)    BURNING  . Levothyroxine Other (See Comments)    Tired, shaky, tremors  .  Meloxicam Swelling and Other (See Comments)    "Burning "  . Nebivolol Rash  . Nsaids Nausea And Vomiting  . Valsartan Itching    CURRENT MEDICATIONS:  Outpatient Encounter Medications as of 09/12/2017  Medication Sig  . albuterol (PROVENTIL HFA;VENTOLIN HFA) 108 (90 Base) MCG/ACT inhaler Inhale 1 puff into the lungs every 6 (six) hours as needed for wheezing or shortness of breath.  Marland Kitchen aspirin EC 81 MG tablet Take 81 mg by mouth daily.  Marland Kitchen atorvastatin (LIPITOR) 40 MG tablet Take  1 tablet (40 mg total) by mouth daily at 6 PM. (Patient taking differently: Take 40 mg by mouth daily at 6 PM. 1900)  . carbidopa-levodopa (SINEMET IR) 25-100 MG tablet Take by mouth. Take 1 tablet by mouth at 7 am, 1 tablet at 11 am, 1 tablet at 3 pm and 1/2 tablet at 7 pm  . Fluticasone Furoate (ARNUITY ELLIPTA) 100 MCG/ACT AEPB Inhale 1 puff into the lungs daily.   . hydrALAZINE (APRESOLINE) 50 MG tablet Take 25 mg by mouth 2 (two) times daily.  . hydrochlorothiazide (MICROZIDE) 12.5 MG capsule Take 12.5 mg by mouth daily.  Marland Kitchen lisinopril (PRINIVIL,ZESTRIL) 20 MG tablet Take 1 tablet (20 mg total) by mouth 2 (two) times daily.  . metoprolol succinate (TOPROL XL) 25 MG 24 hr tablet Take 1 tablet (25 mg total) daily by mouth.  . potassium chloride (K-DUR,KLOR-CON) 10 MEQ tablet Take 10 mEq by mouth daily.   . [DISCONTINUED] furosemide (LASIX) 20 MG tablet Take 1 tablet (20 mg total) by mouth daily.   No facility-administered encounter medications on file as of 09/12/2017.     PAST MEDICAL HISTORY:   Past Medical History:  Diagnosis Date  . Anemia   . Anxiety    takes Klonopin as bedtime  . Asthma   . CHF (congestive heart failure) ([place])   . Depression    takes Citalopram daily  . Gout   . History of blood transfusion    "w/hysterectomy" (04/17/2017)  . Hypertension    takes Verapamil daily  . Hypothyroidism   . NSTEMI (non-ST elevated myocardial infarction) ([place]) 04/17/2017  . OSA on CPAP     "sitting on  5" (04/17/2017)  . Overactive bladder   . Parkinson's disease ([place])    takes Sinemet daily  . Peripheral edema    take Lasix as needed  . Pneumonia    "when I was a kid" (04/17/2017)  . Seasonal allergies   . Skin cancer    left elbow" (04/17/2017)  . Stroke [facility])    "weaker on my right side since; he last one I know of was in 2014" (04/17/2017)  . Tachycardia   . Urinary incontinence     PAST SURGICAL HISTORY:   Past Surgical History:  Procedure Laterality Date  . ABDOMINAL HYSTERECTOMY    . CESAREAN SECTION  1976  . LEFT HEART CATH AND CORONARY ANGIOGRAPHY N/A 04/18/2017   Procedure: LEFT HEART CATH AND CORONARY ANGIOGRAPHY;  Surgeon: Jettie Booze, MD;  Location: [facility];  Service: Cardiovascular;  Laterality: N/A;  . MINOR PLACEMENT OF FIDUCIAL N/A 11/22/2016   Procedure: Fiducial placement;  Surgeon: Erline Levine, MD;  Location: [facility];  Service: Neurosurgery;  Laterality: N/A;  Fiducial placement  . PULSE GENERATOR IMPLANT Right 12/06/2016   Procedure: Unilateral PULSE GENERATOR IMPLANT;  Surgeon: Erline Levine, MD;  Location: [place];  Service: Neurosurgery;  Laterality: Right;  Unilateral PULSE GENERATOR IMPLANT  . RADIOLOGY WITH ANESTHESIA N/A 09/13/2016   Procedure: MRI BRAIN WITH AND WITHOUT;  Surgeon: Medication Radiologist, MD;  Location: [place];  Service: Radiology;  Laterality: N/A;  . SKIN CANCER EXCISION Left    points to elbow  . SUBTHALAMIC STIMULATOR INSERTION Bilateral 11/29/2016   Procedure: Bilateral Deep brain stimulator placement;  Surgeon: Erline Levine, MD;  Location: [place];  Service: Neurosurgery;  Laterality: Bilateral;  Bilateral Deep brain stimulator placement  . SVT ABLATION N/A 06/12/2017   Procedure: SVT Ablation;  Surgeon: Evans Lance, MD;  Location:  [place];  Service: Cardiovascular;  Laterality: N/A;  . THYROIDECTOMY, PARTIAL    . TONSILLECTOMY    . TUBAL LIGATION    . UTERINE FIBROID SURGERY      SOCIAL HISTORY:   Social  History   Socioeconomic History  . Marital status: Widowed    Spouse name: Not on file  . Number of children: Not on file  . Years of education: Not on file  . Highest education level: Not on file  Social Needs  . Financial resource strain: Not on file  . Food insecurity - worry: Not on file  . Food insecurity - inability: Not on file  . Transportation needs - medical: Not on file  . Transportation needs - non-medical: Not on file  Occupational History  . Occupation: retired  Tobacco Use  . Smoking status: Former Smoker    Last attempt to quit: 09/27/2012    Years since quitting: 4.9  . Smokeless tobacco: Never Used  Substance and Sexual Activity  . Alcohol use: Yes    Alcohol/week: 0.0 oz    Comment: 04/17/2017 "nothing since the 1980s"  . Drug use: No  . Sexual activity: No  Other Topics Concern  . Not on file  Social History Narrative  . Not on file    FAMILY HISTORY:   Family Status  Relation Name Status  . Mother  Deceased       heart disease, DM, complications of fall  . Father  Deceased       MI  . Sister  Deceased       DM, CAD, renal failure  . Sister  Alive       healthy  . Child  Alive       healthy    ROS:  A complete 10 system review of systems was obtained and was unremarkable apart from what is mentioned above.  PHYSICAL EXAMINATION:    VITALS:   Vitals:   09/12/17 0955  SpO2: 94%  Weight: 164 lb (74.4 kg)  Height: 5\' 3"  (1.6 m)    GEN:  The patient appears stated age and is in NAD. HEENT:  Normocephalic, atraumatic.  The mucous membranes are moist. The superficial temporal arteries are without ropiness or tenderness. CV:  Bradycardic.  Regular. Lungs:  CTAB.  There is dyspnea on exertion. Neck/HEME:  There are no carotid bruits bilaterally. Skin: Incisions over the chest and scalp are well-healed.  Neurological examination:  Orientation: The patient is alert and oriented x3. Fund of knowledge is appropriate.   Cranial nerves: There  is good facial symmetry.  There is mild facial hypomimia.   The speech is fluent and very minimally dysarthric.   No issues with EOM movement.  Soft palate rises symmetrically and there is no tongue deviation. Hearing is intact to conversational tone. Sensation: Sensation is intact to light touch throughout.  Movement examination: Tone: There is no rigidity.   Abnormal movements:  She has no dyskinesia today.  She has rare tremor in the right hand Coordination: There isdecremation with any form of RAMS, including alternating supination and pronation of the forearm, hand opening and closing, finger taps, heel taps and toe taps on the right.   Gait and Station: she has to push off of the chair  DBS programming was performed today.    ASSESSMENT/PLAN:  1.  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  -The patient is continuing to experience motor fluctuations  including dyskinesia and on/off.    -The patient is status post bilateral STN DBS with the [facility] device on 11/29/2016 with IPG placed on 12/07/2016.  Her device was activated on 01/02/2017.  -She will continue carbidopa/levodopa 25/100, 1 tablets at 7am, 1 tablets at 11am, 1 tablet at 3pm, half tablet at 7pm   -she needs to engage in RSB and not give excuses as to why she cannot attend things.  -will send for home PT/ST  -safety in the home discussed extensively   2.  RBD and insomnia  -restart klonopin - 0.5 mg - 1/2 po qhs, although I think that the main reason she has insomnia now is lack of daytime activity. Risks, benefits, side effects and alternative therapies were discussed.  The opportunity to ask questions was given and they were answered to the best of my ability.  The patient expressed understanding and willingness to follow the outlined treatment protocols.  -we will continue to hold remeron since that was primarily for sleep (it did work if need to come back  to that)  -get bed rails  3.  Vasomotor rhinitis  -Has atropine ophthalmic.  She needs to try and increase to tid.    4.  Sialorrhea  -This is commonly associated with PD.  We talked about treatments.  The patient is not a candidate for oral anticholinergic therapy because of increased risk of confusion and falls.  We discussed myobloc and 1% atropine drops.  We discusssed that candy like lemon drops can help by stimulating mm of the oropharynx to induce swallowing.  She would like to try the botox as she states that she has a "stream of saliva" she cannot control  5.  OSAS and SOB  -She has CPAP.  6.  Dizziness/Orthostatic hypotension  -somewhat improved.  Cardiology has been working with her BP.  7.  NSTEMI in 04/2017  -she has subsequently had a cardiac ablation for SVT.  8.  Follow up is anticipated in the next few months, sooner should new neurologic issues arise.  Much greater than 50% of this visit was spent in counseling and coordinating care.  Total face to face time:  40 which didn't include DBS programming time.

## 2017-09-12 ENCOUNTER — Encounter: Payer: Self-pay | Admitting: Neurology

## 2017-09-12 ENCOUNTER — Telehealth: Payer: Self-pay | Admitting: Neurology

## 2017-09-12 ENCOUNTER — Ambulatory Visit (INDEPENDENT_AMBULATORY_CARE_PROVIDER_SITE_OTHER): Payer: PRIVATE HEALTH INSURANCE | Admitting: Neurology

## 2017-09-12 VITALS — Ht 63.0 in | Wt 164.0 lb

## 2017-09-12 DIAGNOSIS — G4752 REM sleep behavior disorder: Secondary | ICD-10-CM

## 2017-09-12 DIAGNOSIS — G249 Dystonia, unspecified: Secondary | ICD-10-CM | POA: Diagnosis not present

## 2017-09-12 DIAGNOSIS — K117 Disturbances of salivary secretion: Secondary | ICD-10-CM

## 2017-09-12 DIAGNOSIS — G20 Parkinson's disease: Secondary | ICD-10-CM

## 2017-09-12 MED ORDER — CLONAZEPAM 0.5 MG PO TABS: 0.2500 mg | ORAL_TABLET | Freq: Every day | ORAL | 1 refills | Status: DC

## 2017-09-12 NOTE — Procedures (Signed)
DBS Programming was performed on [employer] Device  Total time spent programming was 25 minutes.  Device was found to be on and adequately charged.   Impedences were checked and were within normal limits.  Battery was checked and was determined to be functioning normally and not near the end of life.   Pt charging weekly for about 1 hour each time.  Final settings were as follows:  Left brain electrode:     2-C+           ; Amplitude  2.2   mAmp (tried 2.3 with significant increase in dyskinesia)   ; Pulse width 60 microseconds;   Frequency   130   Hz. (impedence 1085)  Right brain electrode:     11-C+          ; Amplitude   1.6  mAmps ;  Pulse width 60  microseconds;  Frequency   130    Hz. (impedence 1198)

## 2017-09-12 NOTE — Patient Instructions (Addendum)
1. Check to see if you still have Clonazepam at home.   2. We will send a referral to home health for physical and speech therapy. They will call you to schedule.   3. We will get approval for Botox. I have you scheduled for December 29, 2017 at 1:30 pm for your Botox injection.

## 2017-09-12 NOTE — Telephone Encounter (Signed)
Clonazepam 0.5 mg tablets - 1/2 at bedtime sent to pharmacy. Patient made aware.  Dr. Carles Collet Juluis Rainier.

## 2017-09-12 NOTE — Telephone Encounter (Signed)
Patient states that she is out of the clonazepam

## 2017-09-12 NOTE — Addendum Note (Signed)
Addended byAnnamaria Helling on: 09/12/2017 02:06 PM   Modules accepted: Orders

## 2017-10-27 ENCOUNTER — Encounter: Payer: Self-pay | Admitting: Internal Medicine

## 2017-10-27 ENCOUNTER — Ambulatory Visit (INDEPENDENT_AMBULATORY_CARE_PROVIDER_SITE_OTHER): Payer: PRIVATE HEALTH INSURANCE | Admitting: Internal Medicine

## 2017-10-27 VITALS — BP 124/68 | HR 57 | Ht 63.0 in | Wt 163.0 lb

## 2017-10-27 DIAGNOSIS — I471 Supraventricular tachycardia: Secondary | ICD-10-CM | POA: Diagnosis not present

## 2017-10-27 NOTE — Patient Instructions (Addendum)
Medication Instructions:  Your physician has recommended you make the following change in your medication:  1.  Starting tomorrow cut your Toprol XL 25 mg tablet in half and take once a day for 7 days. 2.  Stop your Toprol altogether on November 04, 2017.  Labwork: None ordered.  Testing/Procedures: None ordered.  Follow-Up: Your physician wants you to follow-up in: 4 months with Dr. Lovena Le.    Any Other Special Instructions Will Be Listed Below (If Applicable).  If you need a refill on your cardiac medications before your next appointment, please call your pharmacy.

## 2017-10-27 NOTE — Progress Notes (Signed)
HPI Mrs. Delima returns for followup. She has a h/o SVT and underwent ablation. She had palpitations without heart racing and was placed on toprol but has had weakness and dizziness with HR's in the low 50's. Her blood pressure has not been high.  Allergies  Allergen Reactions  . Amoxicillin Anaphylaxis, Swelling, Rash and Other (See Comments)    Has patient had a PCN reaction causing immediate rash, facial/tongue/throat swelling, SOB or lightheadedness with hypotension: Yes PCN reaction causing SEVERE RASH INVOLVING MUCUS MEMBRANES or SKIN NECROSIS: Yes Has patient had a PCN reaction that required hospitalization: No Has patient had a PCN reaction occurring within the last 10 years: Yes   . Prednisone Shortness Of Breath, Nausea Only and Other (See Comments)    Hypertension   . Amlodipine Itching and Other (See Comments)    BURNING  . Levothyroxine Other (See Comments)    Tired, shaky, tremors  . Meloxicam Swelling and Other (See Comments)    "Burning "  . Nebivolol Rash  . Nsaids Nausea And Vomiting  . Valsartan Itching     Current Outpatient Medications  Medication Sig Dispense Refill  . albuterol (PROVENTIL HFA;VENTOLIN HFA) 108 (90 Base) MCG/ACT inhaler Inhale 1 puff into the lungs every 6 (six) hours as needed for wheezing or shortness of breath.    Marland Kitchen aspirin EC 81 MG tablet Take 81 mg by mouth daily.    Marland Kitchen atorvastatin (LIPITOR) 40 MG tablet Take 1 tablet (40 mg total) by mouth daily at 6 PM. (Patient taking differently: Take 40 mg by mouth daily at 6 PM. 1900) 30 tablet 0  . carbidopa-levodopa (SINEMET IR) 25-100 MG tablet Take by mouth. Take 1 tablet by mouth at 7 am, 1 tablet at 11 am, 1 tablet at 3 pm and 1/2 tablet at 7 pm    . clonazePAM (KLONOPIN) 0.5 MG tablet Take 0.5 tablets (0.25 mg total) by mouth at bedtime. 45 tablet 1  . Cranberry 425 MG CAPS Take 425 mg by mouth 2 (two) times daily.    . Fluticasone Furoate (ARNUITY ELLIPTA) 100 MCG/ACT AEPB Inhale 1  puff into the lungs daily.     . furosemide (LASIX) 20 MG tablet Take 20 mg by mouth.    . hydrALAZINE (APRESOLINE) 50 MG tablet Take 50 mg by mouth 3 (three) times daily.     . hydrochlorothiazide (MICROZIDE) 12.5 MG capsule Take 12.5 mg by mouth daily.    Marland Kitchen lisinopril (PRINIVIL,ZESTRIL) 20 MG tablet Take 1 tablet (20 mg total) by mouth 2 (two) times daily. 30 tablet 0  . Melatonin 5 MG TABS Take 5 mg by mouth at bedtime.    . metoprolol succinate (TOPROL XL) 25 MG 24 hr tablet Take 1 tablet (25 mg total) daily by mouth. 30 tablet 11  . potassium chloride (K-DUR,KLOR-CON) 10 MEQ tablet Take 10 mEq by mouth daily.      No current facility-administered medications for this visit.      Past Medical History:  Diagnosis Date  . Anemia   . Anxiety    takes Klonopin as bedtime  . Asthma   . CHF (congestive heart failure) ([place])   . Depression    takes Citalopram daily  . Gout   . History of blood transfusion    "w/hysterectomy" (04/17/2017)  . Hypertension    takes Verapamil daily  . Hypothyroidism   . NSTEMI (non-ST elevated myocardial infarction) ([place]) 04/17/2017  . OSA on CPAP     "  sitting on 5" (04/17/2017)  . Overactive bladder   . Parkinson's disease ([place])    takes Sinemet daily  . Peripheral edema    take Lasix as needed  . Pneumonia    "when I was a kid" (04/17/2017)  . Seasonal allergies   . Skin cancer    left elbow" (04/17/2017)  . Stroke [facility])    "weaker on my right side since; he last one I know of was in 2014" (04/17/2017)  . Tachycardia   . Urinary incontinence     ROS:   All systems reviewed and negative except as noted in the HPI.   Past Surgical History:  Procedure Laterality Date  . ABDOMINAL HYSTERECTOMY    . CESAREAN SECTION  1976  . LEFT HEART CATH AND CORONARY ANGIOGRAPHY N/A 04/18/2017   Procedure: LEFT HEART CATH AND CORONARY ANGIOGRAPHY;  Surgeon: Jettie Booze, MD;  Location: [place] [facility];  Service: Cardiovascular;  Laterality: N/A;    . MINOR PLACEMENT OF FIDUCIAL N/A 11/22/2016   Procedure: Fiducial placement;  Surgeon: Erline Levine, MD;  Location: [place];  Service: Neurosurgery;  Laterality: N/A;  Fiducial placement  . PULSE GENERATOR IMPLANT Right 12/06/2016   Procedure: Unilateral PULSE GENERATOR IMPLANT;  Surgeon: Erline Levine, MD;  Location: [place];  Service: Neurosurgery;  Laterality: Right;  Unilateral PULSE GENERATOR IMPLANT  . RADIOLOGY WITH ANESTHESIA N/A 09/13/2016   Procedure: MRI BRAIN WITH AND WITHOUT;  Surgeon: Medication Radiologist, MD;  Location: [place];  Service: Radiology;  Laterality: N/A;  . SKIN CANCER EXCISION Left    points to elbow  . SUBTHALAMIC STIMULATOR INSERTION Bilateral 11/29/2016   Procedure: Bilateral Deep brain stimulator placement;  Surgeon: Erline Levine, MD;  Location: [place];  Service: Neurosurgery;  Laterality: Bilateral;  Bilateral Deep brain stimulator placement  . SVT ABLATION N/A 06/12/2017   Procedure: SVT Ablation;  Surgeon: Evans Lance, MD;  Location: [place] [facility];  Service: Cardiovascular;  Laterality: N/A;  . THYROIDECTOMY, PARTIAL    . TONSILLECTOMY    . TUBAL LIGATION    . UTERINE FIBROID SURGERY       Family History  Problem Relation Age of Onset  . Heart disease Father      Social History   Socioeconomic History  . Marital status: Widowed    Spouse name: Not on file  . Number of children: Not on file  . Years of education: Not on file  . Highest education level: Not on file  Social Needs  . Financial resource strain: Not on file  . Food insecurity - worry: Not on file  . Food insecurity - inability: Not on file  . Transportation needs - medical: Not on file  . Transportation needs - non-medical: Not on file  Occupational History  . Occupation: retired  Tobacco Use  . Smoking status: Former Smoker    Last attempt to quit: 09/27/2012    Years since quitting: 5.0  . Smokeless tobacco: Never Used  Substance and Sexual Activity  . Alcohol use: Yes     Alcohol/week: 0.0 oz    Comment: 04/17/2017 "nothing since the 1980s"  . Drug use: No  . Sexual activity: No  Other Topics Concern  . Not on file  Social History Narrative  . Not on file     BP 124/68   Pulse (!) 57   Ht 5\' 3"  (1.6 m)   Wt 163 lb (73.9 kg)   SpO2 96%   BMI 28.87 kg/m  Physical Exam:  frail appearing elderly woman, NAD HEENT: Unremarkable Neck:  No JVD, no thyromegally Lymphatics:  No adenopathy Back:  No CVA tenderness Lungs:  Clear with no wheezes HEART:  Regular rate rhythm, no murmurs, no rubs, no clicks Abd:  soft, positive bowel sounds, no organomegally, no rebound, no guarding Ext:  2 plus pulses, no edema, no cyanosis, no clubbing Skin:  No rashes no nodules Neuro:  CN II through XII intact, motor grossly intact  EKG - sinus bradycardia   Assess/Plan: 1. Weakness and bradycardia - I have asked her to wean off of the toprol. 2. Palpitations - these are mostly resolved. Will follow. 3. SVT - she has had no more sustained arrhythmias. 4. HTN - her blood pressure is controlled. If it is a little high, I would not be upset as we are trying to help her feel better.  Waris Rodger,M.D.

## 2017-12-29 ENCOUNTER — Ambulatory Visit: Payer: PRIVATE HEALTH INSURANCE | Admitting: Neurology

## 2018-01-16 ENCOUNTER — Encounter: Payer: PRIVATE HEALTH INSURANCE | Admitting: Neurology

## 2018-01-16 NOTE — Progress Notes (Signed)
u   Ana Salazar was seen today in the movement disorders clinic for neurologic consultation at the request of [name], MD.   The patient presents today for a second opinion regarding Parkinson's disease. She is accompanied by her daughter and sister who supplement the history.   I have reviewed an extensive number of her records from her prior neurologist, [name], and I appreciate those records.  The patient presented to [name] first in November, 2014, but reported that she had had symptoms for about a year and a half prior to that.  Her first symptoms consisted of difficulty moving her feet/shuffling the feet; trouble getting out of the bed; tremor (she thinks that it started in both but does state that the right is worse than the left and always has been).  The patient was started on a trial of levodopa at her first visit with [name] and followed up with him the next month and noted great improvement with the medication.  She was started on pramipexole the following visit but noted that it made her dizzy and it was discontinued over the telephone.  The following visit, she was started on entacapone 200 mg 3 times a day.  In April, 2015 her carbidopa/levodopa 25/100 was increased to 2 tablets 3 times per day.  In July, 2016 she was changed to carbidopa/levodopa 50/200, 3 times per day.  In October, 2016 she was placed on a combination of carbidopa/levodopa 50/200, 3 times a day (4-5am, noon, 8pm) in addition to carbidopa/levodopa 25/100, 2 tablets 3 times per day (1200 mg of levodopa per day).  She remains on the entacapone, 200 mg 3 times per day as well as primidone, 50 mg - 1.5 tablets at night, which she is using for tremor control.  She reports that her biggest frustration is that she is always moving and is more dizzy and is starting to have a few falls.  When she is nearing end of dose (1-2 hours prior) she will have tremor in her stomach, followed by her legs and arms.    10/13/15  update:  The patient is following care today, primarily for levodopa challenge.  She is currently off of medication.  She is accompanied by her daughter (different daughter than last visit) who supplements the history.  She last took her medication at 8pm.  Her medication generally consists of carbidopa/levodopa 50/200 3 times per day in addition to carbidopa/levodopa 25/100, 1 tablet 6 times per day.  Splitting the medication did help somewhat, but she thinks that it is not helping as much as it does initially when she first started doing it that way.  She is also on entacapone 200 mg 3 times per day and primidone 75 mg at night.  She has not had any falls since our last visit.  01/12/16 update:  The patient is following up today, as she called me to ask me about potentially decreasing her levodopa. She is accompanied by her sister who supplements the history.   She is on a very large dosage and somewhat of a strange combination, but came to me on this combination.  She is currently taking carbidopa/levodopa 50/200 3 times a day (5am/noon/8pm) and is now spreading out her carbidopa/levodopa 25/100 to one tablet 6 times per day (5am is the start and she takes that q 3 hrs until 8 pm).  She remains on entacapone 200 mg 3 times a day and primidone 75 mg daily.  She goes to  bed at about 8pm.  She has found that she is having more dyskinesia.  She has been working with rehabilitation therapist and I have gotten a couple of correspondence is from them that they have been concerned about labored breathing.   I advised follow-up with her primary care physician if this was true shortness of breath.  She did say that she wore a holter monitor for a month and that eval was negative.  Was told if continues needs pulm.  Only SOB when she is hot or exerting herself.  He also mentioned that they were concerned about inability to track inferiorly.  She did see [name] for her neuropsych testing, done on 12/22/2015.  She got  feedback from them on 01/05/2016.  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  I specifically spoke with [name], and she did not feel there was evidence of an atypical state from her testing.  She felt that she could potentially be a good DBS candidate.  She did recommend a vision evaluation, medication to help with anxiety/sleep/irritability, and a follow-up regarding a history of sleep apnea.  Pt states that she was already to the eye doctor in December at [facility].    02/02/16 update:  The patient is following up today, earlier than expected.  She is accompanied by her sister who supplements the history.  Last visit I changed her daytime extended release levodopa all over 2 immediate release.  Therefore, she was taking carbidopa/levodopa 25/100 as follows: 2 at 5am (she is waking up to take that pill and goes to bed until 7am/1 at 8am/1 at 11 am//2 at 2 pm/1 at 5pm and then would take an additional levodopa/levodopa 50/200 at 8pm.  This dropped her overall load of levodopa from 1200 mg to 900 mg, primarily because she was complaining about dyskinesia.  She called me on May 22 and stated that she was doing well initially, but now feels more weak and stiff.  When I asked her specifically, she admitted that she was being treated for urinary tract infection.  I told her to give that some time to resolve, because that can worsen the symptoms of Parkinson's.  She called the following day and requested a follow-up appointment here, which is the reason for follow-up.  She states that she is weaker, which means that the legs are "tired."  She denies freezing.  She states that overall she is more tired.  No falls.  She states that the sx's do not wax and wane throughout the day.  Her sister thinks that she is doing better but sister does state that she is "starting to get that shuffle."  Last PT was in mid April.  C/o nose dripping for years.    05/04/16 update:  The patient follows up today,  accompanied by her sister who supplements the history.  Last visit, the patient wanted to decrease her levodopa load, primarily because of dyskinesia.  We ended up reworking it significantly.  I changed her from the CR formulation to the immediate release formulation and did drop the overall load somewhat.  She is currently taking carbidopa/levodopa 25/100, 2 tablets at 7 AM/2 tablet at 10 AM/2 tablets at 1 PM/1 tablet at 4 PM/carbidopa/levodopa 50/200 at bedtime (8PM).  She cut out the 7pm dosage.    She takes entacapone 3 times per day, which turned out to be every other dose of levodopa.  She is still on primidone, but only on 50 mg  daily, which she was on prior to coming to see me.  She initally states that she isn't better but then states that dyskinesia is "a lot better than it was" which was the primary issue for changing.   She has had several falls that she attributes to looking down when she walks or getting dizzy when she first gets up.  Was able to get off of clonidine and losartan per PCP because of dizziness and that helped dizziness.  She hasn't gotten hurt with falls.   The big issue now is trouble sleeping.  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  She states that she was only using one time per day.  She also told me last visit that she was getting ready to have a sleep study for objective sleep apnea syndrome and a pulmonary consult for shortness of breath.  I do not have that information but she tells me that she does have osas and needs cpap titration and is awaiting that.  Denies depression but daughter thinks that she has it.  Pt doesn't have much to do during the day.  06/06/16 update: The patient follows up today, accompanied by her sister and daughter who supplement the history.  She is currently taking carbidopa/levodopa 25/100, 2 tablets at 7 AM/2 tablet at 10 AM/2 tablets at 1 PM/1 tablet at 4  PM/carbidopa/levodopa 50/200 at bedtime (8PM).  She is on entacapone 200 mg 3 times per day.  States that "my legs are really tired." Thinks that this is mostly in middle of the day/early AM and then "I spend the entire day trying to catch up."   Finds that if she takes an extra carbidopa/levodopa 25/100 in the middle of the night she does better.  Last visit, I discontinued her primidone, 50 mg daily.  She was complaining about vasomotor rhinorrhea last visit, and I told her to increase her atropine drops to 3 times a day as needed.  She states that she hasn't do that.  I started remeron last visit for depression and sleep.  Is still on celexa.  Remeron definitely helped sleep but got CPAP since our last visit as well.  Sister states that she is using that and is now screaming and hollaring and hitting the walls at night, but seems this was going on before CPAP.    07/21/16 update:  Pt returns again for levodopa challenge test.  2 of her daughters accompany her and supplement the history.  She is on carbidopa/levodopa 25/100, 2 tablets at 7 AM/2 tablet at 10 AM//2 tablets at 1 PM/1 tablet at 4 PM and then she will decide if she needs her last one tablet at 7 PM.  She also takes carbidopa/levodopa  50/200 at bedtime, which is 8 PM.  She is on comtan 200 mg tid.  She has been off of all PD med for about 60 hrs for this test.  She does feel more slowly and stiff, although she does feel less dizzy getting off of the medication.  10/25/16 update:  Pt returns today for pre-op visit.  She is accompanied by her daughter who supplements the history.  She is scheduled for DBS surgery next month.  She is on carbidopa/levodopa 25/100, 2/2/2/1 and sometimes will take another in middle of the night.  She takes carbidopa/levodopa 50/200 at bedtime.  She takes entacapone with 3 of the daytime dosages of levodopa.  She has had  some falls because not using her walker, often at night per daughter.  She is loving Press [employer].  Patient seen today in follow-up.  01/02/17 update:  Patient seen today in follow-up.  She is accompanied by her daughters who supplement the history.  She is status post bilateral STN DBS on 11/29/2016.  She had her generator placed on 12/07/2016.  She was in the hospital between the 2 surgeries with complaints of falls.  I had talked to Dr. Vertell Limber.  The patient had refused to go to subacute nursing facility for rehabilitation.  She had apparently also been refusing to use her walker.  We did get home health involved.  She has had a few falls since home health.  She fell on her butt this weekend and "it was the dogs fault."  Few other falls related to letting go of the walker.  Patient does think some of the falls were related to dizziness.  Her primary care physician has just cut back on her verapamil.  Some of issues that were being reported were due to family stress between patient and her sister.  She is now back on carbidopa/levodopa 25/100, 2/2/2/1 and entacapone with 3 of the dosages.  She is on carbidopa/levodopa 50/200 at bedtime.  She last took her PD med at [place] has not been great.  Family having difficulty understanding her.  Hasn't been doing ST with home health.  01/17/17 update:  Patient seen today in follow-up.  I activated her device on 01/02/2017.  She is currently on carbidopa/levodopa 25/100, 2 tablets at 7, 1 tablet at 10, 1 tablet at 1pm, 1 tablet at 4pm and she has d/c the carbidopa/levodopa 50/200 at night.  I discontinued her entacapone last visit.  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  She has been having some dyskinesia mostly with the L leg.  She is not stable and has had some falls in the house.  She has never had a fall with the walker but has had a fall without the walker.  Speech therapy just started last week.    02/03/17 update:  Patient seen today in follow-up.  Last visit, I decreased her medication and told her to  take carbidopa/levodopa 25/100, 1 tablets at 7am, 0.5 tablets at 11am, 3pm, 7pm.  While dyskinesia is better, she has had multiple falls.  With all of the falls, she has let go of the walker for some reason (getting out of shower, making a sandwich).  "I'm taking the walker more than I did."  She has never fallen when actually holding on to the walker.  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  05/23/17 update: patient seen today in follow-up for her Parkinson's disease.  She is accompanied by her daughter and sister who supplements the history.  Much has happened since our last visit.  I have reviewed hospital records made available to me.  She was admitted to the hospital on 04/17/2017 and discharged on 04/19/2017 with NSTEMI.  Hospital discharge records indicate that she was supposed to follow-up with Dr. Wynonia Lawman 2 weeks after discharge, but I don't see any appointment that she has attended, nor do I see an appointment made in the future.  They tell me that they have an appointment today.  She was given information for the adult daycare program.  She states that they have trouble getting her meds  ontime but the therapy helped.  It has also produced a great schedule for her, which has helped per family but patient doesn't like it.  Pt would like to return to [facility] as she feels weak. In regards to Parkinson's disease, she is on carbidopa/levodopa 25/100, one tablet at 7 AM and half a tablet at 11 AM/ on full tablet at 3 PM/ half tablet at 7 PM.  She is noting intermittent right arm tremor.  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    09/12/17 update: Patient seen today in follow-up for Parkinson's disease.  She is accompanied by her her daughter who supplements the history.  The patient is on carbidopa/levodopa 25/100, 1 tablet at 7 AM, 1 tablet at 11 AM, 1 tablet at 3 PM, half a tablet at 7 PM.  She remains on clonazepam 0.5 mg, half  tablet at night for REM behavior disorder.  I have reviewed records since her last visit.  She saw Dr. Wynonia Lawman the same day she saw me last visit.  She has had a long history of SVT.  He wanted her to see an electrophysiologist for consideration of possible ablation versus pacemaker.  She had a cardiac ablation on June 12, 2017 with Dr. Lovena Le.  Following that, it appears that she was having systolic PVCs or PACs and Toprol was prescribed.  She was given parameters.  She was told if systolic <008  hold hydralazine but take Toprol XL and lisinopril.  She was also advised to take her blood pressure before taking her evening hydralazine and lisinopril. she has had falls since our last visit.  They are happening about 1 time per week.  She feels it is due to "lightheadedness and my knees giving way."  Not related to timing of medication.  Always has walker.  Has not had PT but then states that she is doing some therapy at [place].  Going to [facility] one day a week, but daughter states that she is giving excuses to stop going to that (such as stating that she has discharged her DBS system).  Trouble with insomnia but also just "laying around" when at home and not engaging in activities.  Complains of drooling, which has become excessive.  01/18/18 update: Patient is seen today in follow-up.  Records have been reviewed since our last visit.  She is currently on carbidopa/levodopa 25/100, 1 tablets at 7am, 1 tablets at 11am, 1 tablet at 3pm, half tablet at 7pm.  She was told to restart her clonazepam for REM behavior disorder.  She reports that she changed to melatonin.  She is using a bed rail.  Her daughter hears her yelling in sleep about 1 time per week. She was seen by cardiology in February.  She was told to wean off of the Toprol because of weakness and bradycardia.  She is feeling better in that regard.  She is having rare falls per daughter but pt estimates 1-2 times per month.  Using walker at all times.  She is  going to therapy and day program 5 days per week.  Charging device 5-6 days per week and taking 1.5 hours.   Daughter not noticing dyskinesia at all.    ALLERGIES:   Allergies  Allergen Reactions  . Amoxicillin Anaphylaxis, Swelling, Rash and Other (See Comments)    Has patient had a PCN reaction causing immediate rash, facial/tongue/throat swelling, SOB or lightheadedness with hypotension: Yes PCN reaction causing SEVERE RASH INVOLVING  MUCUS MEMBRANES or SKIN NECROSIS: Yes Has patient had a PCN reaction that required hospitalization: No Has patient had a PCN reaction occurring within the last 10 years: Yes   . Prednisone Shortness Of Breath, Nausea Only and Other (See Comments)    Hypertension   . Amlodipine Itching and Other (See Comments)    BURNING  . Levothyroxine Other (See Comments)    Tired, shaky, tremors  . Meloxicam Swelling and Other (See Comments)    "Burning "  . Nebivolol Rash  . Nsaids Nausea And Vomiting  . Valsartan Itching    CURRENT MEDICATIONS:  Outpatient Encounter Medications as of 01/18/2018  Medication Sig  . albuterol (PROVENTIL HFA;VENTOLIN HFA) 108 (90 Base) MCG/ACT inhaler Inhale 1 puff into the lungs every 6 (six) hours as needed for wheezing or shortness of breath.  Marland Kitchen aspirin EC 81 MG tablet Take 81 mg by mouth daily.  Marland Kitchen atorvastatin (LIPITOR) 40 MG tablet Take 1 tablet (40 mg total) by mouth daily at 6 PM. (Patient taking differently: Take 40 mg by mouth daily at 6 PM. 1900)  . atropine 1 % ophthalmic solution Apply 1 drop to eye 2 (two) times daily.  . carbidopa-levodopa (SINEMET IR) 25-100 MG tablet Take by mouth. Take 1 tablet by mouth at 7 am, 1 tablet at 11 am, 1 tablet at 3 pm and 1/2 tablet at 7 pm  . Fluticasone Furoate (ARNUITY ELLIPTA) 100 MCG/ACT AEPB Inhale 1 puff into the lungs daily.   . furosemide (LASIX) 20 MG tablet Take 20 mg by mouth.  . hydrALAZINE (APRESOLINE) 50 MG tablet Take 50 mg by mouth 3 (three) times daily.   .  hydrochlorothiazide (MICROZIDE) 12.5 MG capsule Take 12.5 mg by mouth daily.  Marland Kitchen lisinopril (PRINIVIL,ZESTRIL) 20 MG tablet Take 1 tablet (20 mg total) by mouth 2 (two) times daily.  . Melatonin 5 MG TABS Take 5 mg by mouth at bedtime.  . potassium chloride (K-DUR,KLOR-CON) 10 MEQ tablet Take 10 mEq by mouth daily.   . [DISCONTINUED] clonazePAM (KLONOPIN) 0.5 MG tablet Take 0.5 tablets (0.25 mg total) by mouth at bedtime.  . [DISCONTINUED] Cranberry 425 MG CAPS Take 425 mg by mouth 2 (two) times daily.  . [DISCONTINUED] metoprolol succinate (TOPROL XL) 25 MG 24 hr tablet Take 1 tablet (25 mg total) daily by mouth.   No facility-administered encounter medications on file as of 01/18/2018.     PAST MEDICAL HISTORY:   Past Medical History:  Diagnosis Date  . Anemia   . Anxiety    takes Klonopin as bedtime  . Asthma   . CHF (congestive heart failure) ([place])   . Depression    takes Citalopram daily  . Gout   . History of blood transfusion    "w/hysterectomy" (04/17/2017)  . Hypertension    takes Verapamil daily  . Hypothyroidism   . NSTEMI (non-ST elevated myocardial infarction) ([place]) 04/17/2017  . OSA on CPAP     "sitting on 5" (04/17/2017)  . Overactive bladder   . Parkinson's disease ([place])    takes Sinemet daily  . Peripheral edema    take Lasix as needed  . Pneumonia    "when I was a kid" (04/17/2017)  . Seasonal allergies   . Skin cancer    left elbow" (04/17/2017)  . Stroke [facility])    "weaker on my right side since; he last one I know of was in 2014" (04/17/2017)  . Tachycardia   . Urinary incontinence  PAST SURGICAL HISTORY:   Past Surgical History:  Procedure Laterality Date  . ABDOMINAL HYSTERECTOMY    . CESAREAN SECTION  1976  . LEFT HEART CATH AND CORONARY ANGIOGRAPHY N/A 04/18/2017   Procedure: LEFT HEART CATH AND CORONARY ANGIOGRAPHY;  Surgeon: Jettie Booze, MD;  Location: [facility];  Service: Cardiovascular;  Laterality: N/A;  . MINOR PLACEMENT  OF FIDUCIAL N/A 11/22/2016   Procedure: Fiducial placement;  Surgeon: Erline Levine, MD;  Location: [facility];  Service: Neurosurgery;  Laterality: N/A;  Fiducial placement  . PULSE GENERATOR IMPLANT Right 12/06/2016   Procedure: Unilateral PULSE GENERATOR IMPLANT;  Surgeon: Erline Levine, MD;  Location: [facility];  Service: Neurosurgery;  Laterality: Right;  Unilateral PULSE GENERATOR IMPLANT  . RADIOLOGY WITH ANESTHESIA N/A 09/13/2016   Procedure: MRI BRAIN WITH AND WITHOUT;  Surgeon: Medication Radiologist, MD;  Location: [facility];  Service: Radiology;  Laterality: N/A;  . SKIN CANCER EXCISION Left    points to elbow  . SUBTHALAMIC STIMULATOR INSERTION Bilateral 11/29/2016   Procedure: Bilateral Deep brain stimulator placement;  Surgeon: Erline Levine, MD;  Location: [facility];  Service: Neurosurgery;  Laterality: Bilateral;  Bilateral Deep brain stimulator placement  . SVT ABLATION N/A 06/12/2017   Procedure: SVT Ablation;  Surgeon: Evans Lance, MD;  Location: [facility];  Service: Cardiovascular;  Laterality: N/A;  . THYROIDECTOMY, PARTIAL    . TONSILLECTOMY    . TUBAL LIGATION    . UTERINE FIBROID SURGERY      SOCIAL HISTORY:   Social History   Socioeconomic History  . Marital status: Widowed    Spouse name: Not on file  . Number of children: Not on file  . Years of education: Not on file  . Highest education level: Not on file  Occupational History  . Occupation: retired  Scientific laboratory technician  . Financial resource strain: Not on file  . Food insecurity:    Worry: Not on file    Inability: Not on file  . Transportation needs:    Medical: Not on file    Non-medical: Not on file  Tobacco Use  . Smoking status: Former Smoker    Last attempt to quit: 09/27/2012    Years since quitting: 5.3  . Smokeless tobacco: Never Used  Substance and Sexual Activity  . Alcohol use: Yes    Alcohol/week: 0.0 oz    Comment: 04/17/2017 "nothing since the 1980s"  . Drug use: No  . Sexual activity: Never    Lifestyle  . Physical activity:    Days per week: Not on file    Minutes per session: Not on file  . Stress: Not on file  Relationships  . Social connections:    Talks on phone: Not on file    Gets together: Not on file    Attends religious service: Not on file    Active member of club or organization: Not on file    Attends meetings of clubs or organizations: Not on file    Relationship status: Not on file  . Intimate partner violence:    Fear of current or ex partner: Not on file    Emotionally abused: Not on file    Physically abused: Not on file    Forced sexual activity: Not on file  Other Topics Concern  . Not on file  Social History Narrative  . Not on file    FAMILY HISTORY:   Family Status  Relation Name Status  . Mother  Deceased       heart disease, DM, complications of fall  . Father  Deceased       MI  . Sister  Deceased       DM, CAD, renal failure  . Sister  Alive       healthy  . Child  Alive       healthy    ROS:  A complete 10 system review of systems was obtained and was unremarkable apart from what is mentioned above.  PHYSICAL EXAMINATION:    VITALS:   Vitals:   01/18/18 1458  BP: 134/78  Pulse: 70  SpO2: 95%  Weight: 163 lb (73.9 kg)  Height: 5\' 3"  (1.6 m)    GEN:  The patient appears stated age and is in NAD.  More jovial today HEENT:  Normocephalic, atraumatic.  The mucous membranes are moist. The superficial temporal arteries are without ropiness or tenderness. CV:  RRR Lungs:  CTAB.  There is dyspnea on exertion. Neck/HEME:  There are no carotid bruits bilaterally.  Neurological examination:  Orientation: The patient is alert and oriented x3. Fund of knowledge is appropriate.   Cranial nerves: There is good facial symmetry.  There is mild facial hypomimia.   The speech is fluent and very minimally dysarthric.  She is hypophonic.   No issues with EOM movement.  Soft palate rises symmetrically and there is no tongue deviation.  Hearing is intact to conversational tone. Sensation: Sensation is intact to light touch throughout.  Movement examination: Tone: There is no rigidity.   Abnormal movements:  She has rare tremor on the right hand.   Coordination: There isdecremation with any form of RAMS, including alternating supination and pronation of the forearm, hand opening and closing, finger taps, heel taps and toe taps on the right.   Gait and Station: she has to push off of the chair but walks very well in the hall without using hands.  DBS programming was performed today.    ASSESSMENT/PLAN:  1.  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  -The patient is continuing to experience motor fluctuations including dyskinesia and on/off.    -The patient is status post bilateral STN DBS with the [facility] Scientific device on 11/29/2016 with IPG placed on 12/07/2016.  Her device was activated on 01/02/2017.  -She will continue carbidopa/levodopa 25/100, 1 tablets at 7am, 1 tablets at 11am, 1 tablet at 3pm, half tablet at 7pm   -she looks better than in the past.  -safety discussed.  Use walker at all times.   2.  RBD and insomnia  -low dose klonopin worked well but she has bed rails now and d/c that.  Using melatonin for sleep which is helping  -we will continue to hold remeron since that was primarily for sleep (it did work if need to come back to that)   3.  Vasomotor rhinitis  -Has atropine ophthalmic.  She needs to try and increase to tid.    4.  OSAS and SOB  -She has CPAP.  5.  Dizziness/Orthostatic hypotension  -somewhat improved.  Cardiology has been working with her BP.  Better after d/c toprol  7.  NSTEMI in 04/2017  -she has subsequently had a cardiac ablation for SVT.  8.  F/u in 5-6 months.

## 2018-01-18 ENCOUNTER — Ambulatory Visit (INDEPENDENT_AMBULATORY_CARE_PROVIDER_SITE_OTHER): Payer: PRIVATE HEALTH INSURANCE | Admitting: Neurology

## 2018-01-18 ENCOUNTER — Encounter: Payer: Self-pay | Admitting: Neurology

## 2018-01-18 VITALS — BP 134/78 | HR 70 | Ht 63.0 in | Wt 163.0 lb

## 2018-01-18 DIAGNOSIS — G20 Parkinson's disease: Secondary | ICD-10-CM | POA: Diagnosis not present

## 2018-01-18 DIAGNOSIS — G4752 REM sleep behavior disorder: Secondary | ICD-10-CM

## 2018-01-18 NOTE — Procedures (Signed)
DBS Programming was performed on [employer] Device  Total time spent programming was 10 minutes.  Device was found to be on and adequately charged.   Impedences were checked and were within normal limits (all under 1200).  Battery was checked and was determined to be functioning normally and not near the end of life.    Final settings were as follows:  Left brain electrode:     2-C+           ; Amplitude  2.3 ; Pulse width 60 microseconds;   Frequency   130   Hz. (impedence 1085)  Right brain electrode:     11-C+          ; Amplitude   1.7  mAmps ;  Pulse width 60  microseconds;  Frequency   130    Hz. (impedence 1198)  Tremor resolution on the right hand with these settings

## 2018-02-23 ENCOUNTER — Ambulatory Visit: Payer: PRIVATE HEALTH INSURANCE | Admitting: Internal Medicine

## 2018-03-16 DIAGNOSIS — R4781 Slurred speech: Secondary | ICD-10-CM | POA: Diagnosis not present

## 2018-03-16 DIAGNOSIS — G20 Parkinson's disease: Secondary | ICD-10-CM | POA: Diagnosis not present

## 2018-03-16 DIAGNOSIS — I10 Essential (primary) hypertension: Secondary | ICD-10-CM

## 2018-03-17 DIAGNOSIS — I10 Essential (primary) hypertension: Secondary | ICD-10-CM | POA: Diagnosis not present

## 2018-03-17 DIAGNOSIS — I6789 Other cerebrovascular disease: Secondary | ICD-10-CM

## 2018-03-17 DIAGNOSIS — R4781 Slurred speech: Secondary | ICD-10-CM | POA: Diagnosis not present

## 2018-03-17 DIAGNOSIS — G20 Parkinson's disease: Secondary | ICD-10-CM | POA: Diagnosis not present

## 2018-03-18 DIAGNOSIS — G20 Parkinson's disease: Secondary | ICD-10-CM | POA: Diagnosis not present

## 2018-03-18 DIAGNOSIS — R4781 Slurred speech: Secondary | ICD-10-CM | POA: Diagnosis not present

## 2018-03-18 DIAGNOSIS — I10 Essential (primary) hypertension: Secondary | ICD-10-CM | POA: Diagnosis not present

## 2018-03-21 ENCOUNTER — Telehealth: Payer: Self-pay | Admitting: Neurology

## 2018-03-21 NOTE — Telephone Encounter (Signed)
Patient's sister called and said that [facility] has put B[facility] on 7 Carbidopa Levodopa pills and she wants to make sure that is correct? Please Call. Thanks

## 2018-03-21 NOTE — Telephone Encounter (Signed)
I spoke with Jeanett Schlein, PA, about medication. I advised her that Dr. Carles Collet needed to be the one to adjust medication since she is managing her DBS as well.  [place] stated since increasing dose 48 hours ago patient is doing much better. No falls. Weakness issues gone. She didn't want to decrease this back to where she was at. I did let her know that she has had issues with high doses in the past and this needs to be managed by Dr. Carles Collet. She was very resistant to changing the dosage back.  She asked that patient be seen sooner. He next appt is in October.  Dr. Carles Collet please advise. Jeanett Schlein can be reached directly at [phone] when you ask for the clinic. I will work on getting records from the hospital.

## 2018-03-21 NOTE — Telephone Encounter (Signed)
She said to ask for Clinic

## 2018-03-21 NOTE — Telephone Encounter (Signed)
Records reviewed from [place] which were dated 03/16/2018 to 03/18/2018.  Patient had trouble stating words and speech was slurred for 10 or 15 minutes.  Patient was evaluated by tele-neurology and was deemed not a candidate for TPA therapy as she was apparently out of the time window.  In addition, slurred speech quickly resolved.  Initial head CT was negative.  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  She had a repeat head CT 24 hours later and this was also nonacute.  CTA of the neck demonstrated moderate 50% proximal ICA stenosis.  There was no stenosis in the left carotid system.  She was continued on antiplatelet therapy with aspirin.  Plavix was added. On March 16, 2018 white blood cells were 6.3, hemoglobin 12.8, hematocrit 37.2 and platelets were 170.  Sodium was 140, potassium 3.7, chloride 102, CO2 30, BUN 24, creatinine 0.7 and glucose 142.  Total cholesterol is 102.  LDL was 50.4.  Hemoglobin A1c 5.0.  Echocardiogram was completed demonstrating a left ventricular ejection fraction between 60 to 65%.  There was mild left ventricular hypertrophy.

## 2018-03-21 NOTE — Telephone Encounter (Signed)
Her appts need to be coordinated with [employer] and are 60 min appts so are very difficult to schedule.  Will await hospital records

## 2018-03-21 NOTE — Telephone Encounter (Signed)
From last office note, "She will continue carbidopa/levodopa 25/100, 1 tablets at 7am, 1 tablets at 11am, 1 tablet at 3pm, half tablet at 7pm."

## 2018-03-21 NOTE — Telephone Encounter (Signed)
Records received and given to Dr. Carles Collet.

## 2018-03-21 NOTE — Telephone Encounter (Signed)
Spoke with Ana Salazar and let her know last instructions from Dr. Carles Collet.  She states the doctor at [place], Jeanett Schlein, increased her Levodopa to 7 tablets a day. I don't see any correspondence with our office about increasing medication. She states tremor has increased, but she wanted Dr. Carles Collet to be the only one to adjust this medication.  She also wanted to let me know patient went to the hospital in [place] on Friday with CVA.  Please advise.

## 2018-03-21 NOTE — Telephone Encounter (Signed)
No one should really be adjusting her carbidopa/levodopa 25/100 except for me.  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  Unless she is able to take care of her PD and her DBS both, I would ask that she let me alone adjust her meds as pt has had a lot of dyskinesia in the past.  Please let [facility] know.  Also, get records from hospital re: stroke

## 2018-03-21 NOTE — Telephone Encounter (Signed)
[facility] called back and would like to speak with you [phone]. Thanks

## 2018-03-21 NOTE — Telephone Encounter (Signed)
Murlean Hark Sister [phone]  Rod Holler has some more questions about what you talk about this morning. Please call back.

## 2018-03-22 NOTE — Telephone Encounter (Signed)
Do you want to keep her on increased dose of Carbidopa Levodopa?

## 2018-03-22 NOTE — Telephone Encounter (Signed)
Please advise after reviewing records.

## 2018-03-22 NOTE — Telephone Encounter (Signed)
If she is doing better as hospital records and PA say, then can keep October appt.  If not, will try to work in somewhere.

## 2018-03-22 NOTE — Telephone Encounter (Signed)
Please advise on appointment - keep in October? Due to increased tremor/falls. Did you want to talk to PA since when I spoke with her she did not want to change medication back to original dosage, even though I told her this should only be changed by you?

## 2018-03-22 NOTE — Telephone Encounter (Signed)
i've reviewed records (see phone encounter).  Hospital has just added plavix.  Symptom only lasted 15 min.  Scans were negative.  Pt was back at baseline when seen at hospital.  No further intervention needed.

## 2018-03-23 NOTE — Telephone Encounter (Signed)
That's fine for now, although I really would like to be one to change or at least be notified of changes by provider

## 2018-03-23 NOTE — Telephone Encounter (Signed)
Spoke with patient's sister and relayed all information. She is doing better for now so we will leave appt as scheduled. She will call if anything worsens.

## 2018-05-23 ENCOUNTER — Ambulatory Visit (INDEPENDENT_AMBULATORY_CARE_PROVIDER_SITE_OTHER): Payer: PRIVATE HEALTH INSURANCE | Admitting: Internal Medicine

## 2018-05-23 ENCOUNTER — Encounter: Payer: Self-pay | Admitting: Internal Medicine

## 2018-05-23 VITALS — BP 146/90 | HR 62 | Ht 62.0 in | Wt 157.2 lb

## 2018-05-23 DIAGNOSIS — I471 Supraventricular tachycardia: Secondary | ICD-10-CM

## 2018-05-23 NOTE — Patient Instructions (Signed)

## 2018-05-23 NOTE — Progress Notes (Signed)
HPI Ana Salazar returns for followup. She has a h/o SVT and underwent ablation. She had palpitations without heart racing and was placed on toprol but has had weakness and dizziness with HR's in the low 50's. Her blood pressure has not been high. She has not had syncope. Her caregiver describes an episode where she had a TIA.  Allergies  Allergen Reactions  . Amoxicillin Anaphylaxis, Swelling, Rash and Other (See Comments)    Has patient had a PCN reaction causing immediate rash, facial/tongue/throat swelling, SOB or lightheadedness with hypotension: Yes PCN reaction causing SEVERE RASH INVOLVING MUCUS MEMBRANES or SKIN NECROSIS: Yes Has patient had a PCN reaction that required hospitalization: No Has patient had a PCN reaction occurring within the last 10 years: Yes   . Prednisone Shortness Of Breath, Nausea Only and Other (See Comments)    Hypertension   . Amlodipine Itching and Other (See Comments)    BURNING  . Levothyroxine Other (See Comments)    Tired, shaky, tremors  . Meloxicam Swelling and Other (See Comments)    "Burning "  . Nebivolol Rash  . Nsaids Nausea And Vomiting  . Valsartan Itching     Current Outpatient Medications  Medication Sig Dispense Refill  . albuterol (PROVENTIL HFA;VENTOLIN HFA) 108 (90 Base) MCG/ACT inhaler Inhale 1 puff into the lungs every 6 (six) hours as needed for wheezing or shortness of breath.    Marland Kitchen atorvastatin (LIPITOR) 40 MG tablet Take 1 tablet (40 mg total) by mouth daily at 6 PM. (Patient taking differently: Take 40 mg by mouth daily at 6 PM. 1900) 30 tablet 0  . atropine 1 % ophthalmic solution Apply 1 drop to eye 2 (two) times daily.    . carbidopa-levodopa (SINEMET IR) 25-100 MG tablet Take by mouth. Take 1 tablet by mouth at 7 am, 1 tablet at 11 am, 1 tablet at 3 pm and 1/2 tablet at 7 pm    . Fluticasone Furoate (ARNUITY ELLIPTA) 100 MCG/ACT AEPB Inhale 1 puff into the lungs daily.     . hydrALAZINE (APRESOLINE) 50 MG tablet  Take 50 mg by mouth 3 (three) times daily.     Marland Kitchen lisinopril (PRINIVIL,ZESTRIL) 20 MG tablet Take 1 tablet (20 mg total) by mouth 2 (two) times daily. 30 tablet 0  . Melatonin 5 MG TABS Take 5 mg by mouth at bedtime.    Marland Kitchen aspirin EC 81 MG tablet Take 81 mg by mouth daily.    . furosemide (LASIX) 20 MG tablet Take 20 mg by mouth.    . hydrochlorothiazide (MICROZIDE) 12.5 MG capsule Take 12.5 mg by mouth daily.    . potassium chloride (K-DUR,KLOR-CON) 10 MEQ tablet Take 10 mEq by mouth daily.      No current facility-administered medications for this visit.      Past Medical History:  Diagnosis Date  . Anemia   . Anxiety    takes Klonopin as bedtime  . Asthma   . CHF (congestive heart failure) ([place])   . Depression    takes Citalopram daily  . Gout   . History of blood transfusion    "w/hysterectomy" (04/17/2017)  . Hypertension    takes Verapamil daily  . Hypothyroidism   . NSTEMI (non-ST elevated myocardial infarction) ([place]) 04/17/2017  . OSA on CPAP     "sitting on 5" (04/17/2017)  . Overactive bladder   . Parkinson's disease ([place])    takes Sinemet daily  . Peripheral edema  take Lasix as needed  . Pneumonia    "when I was a kid" (04/17/2017)  . Seasonal allergies   . Skin cancer    left elbow" (04/17/2017)  . Stroke [facility])    "weaker on my right side since; he last one I know of was in 2014" (04/17/2017)  . Tachycardia   . Urinary incontinence     ROS:   All systems reviewed and negative except as noted in the HPI.   Past Surgical History:  Procedure Laterality Date  . ABDOMINAL HYSTERECTOMY    . CESAREAN SECTION  1976  . LEFT HEART CATH AND CORONARY ANGIOGRAPHY N/A 04/18/2017   Procedure: LEFT HEART CATH AND CORONARY ANGIOGRAPHY;  Surgeon: Jettie Booze, MD;  Location: [facility];  Service: Cardiovascular;  Laterality: N/A;  . MINOR PLACEMENT OF FIDUCIAL N/A 11/22/2016   Procedure: Fiducial placement;  Surgeon: Erline Levine, MD;  Location: [facility];   Service: Neurosurgery;  Laterality: N/A;  Fiducial placement  . PULSE GENERATOR IMPLANT Right 12/06/2016   Procedure: Unilateral PULSE GENERATOR IMPLANT;  Surgeon: Erline Levine, MD;  Location: [facility];  Service: Neurosurgery;  Laterality: Right;  Unilateral PULSE GENERATOR IMPLANT  . RADIOLOGY WITH ANESTHESIA N/A 09/13/2016   Procedure: MRI BRAIN WITH AND WITHOUT;  Surgeon: Medication Radiologist, MD;  Location: [facility];  Service: Radiology;  Laterality: N/A;  . SKIN CANCER EXCISION Left    points to elbow  . SUBTHALAMIC STIMULATOR INSERTION Bilateral 11/29/2016   Procedure: Bilateral Deep brain stimulator placement;  Surgeon: Erline Levine, MD;  Location: [facility];  Service: Neurosurgery;  Laterality: Bilateral;  Bilateral Deep brain stimulator placement  . SVT ABLATION N/A 06/12/2017   Procedure: SVT Ablation;  Surgeon: Evans Lance, MD;  Location: [facility];  Service: Cardiovascular;  Laterality: N/A;  . THYROIDECTOMY, PARTIAL    . TONSILLECTOMY    . TUBAL LIGATION    . UTERINE FIBROID SURGERY       Family History  Problem Relation Age of Onset  . Heart disease Father      Social History   Socioeconomic History  . Marital status: Widowed    Spouse name: Not on file  . Number of children: Not on file  . Years of education: Not on file  . Highest education level: Not on file  Occupational History  . Occupation: retired  Scientific laboratory technician  . Financial resource strain: Not on file  . Food insecurity:    Worry: Not on file    Inability: Not on file  . Transportation needs:    Medical: Not on file    Non-medical: Not on file  Tobacco Use  . Smoking status: Former Smoker    Last attempt to quit: 09/27/2012    Years since quitting: 5.6  . Smokeless tobacco: Never Used  Substance and Sexual Activity  . Alcohol use: Yes    Alcohol/week: 0.0 standard drinks    Comment: 04/17/2017 "nothing since the 1980s"  . Drug use: No  . Sexual activity: Never  Lifestyle  . Physical activity:      Days per week: Not on file    Minutes per session: Not on file  . Stress: Not on file  Relationships  . Social connections:    Talks on phone: Not on file    Gets together: Not on file    Attends religious service: Not on file    Active member of club or organization: Not on file    Attends  meetings of clubs or organizations: Not on file    Relationship status: Not on file  . Intimate partner violence:    Fear of current or ex partner: Not on file    Emotionally abused: Not on file    Physically abused: Not on file    Forced sexual activity: Not on file  Other Topics Concern  . Not on file  Social History Narrative  . Not on file     BP (!) 146/90   Pulse 62   Ht 5\' 2"  (1.575 m)   Wt 157 lb 3.2 oz (71.3 kg)   SpO2 99%   BMI 28.75 kg/m   Physical Exam:  Well appearing 25 woman, NAD HEENT: Unremarkable Neck:  6 cm JVD, no thyromegally Lymphatics:  No adenopathy Back:  No CVA tenderness Lungs:  Clear with no wheezes HEART:  Regular rate rhythm, no murmurs, no rubs, no clicks Abd:  soft, positive bowel sounds, no organomegally, no rebound, no guarding Ext:  2 plus pulses, no edema, no cyanosis, no clubbing Skin:  No rashes no nodules Neuro:  CN II through XII intact, motor grossly intact  EKG - nsr  Assess/Plan: 1. Palpitations - these appear to have improved. She will continue her current meds. 2. SVT - she has had no additional episodes s/p ablation. 3. HTN - her blood pressure is elevated. I considered incresing her meds but will hold off for now. 4. Dyslipidemia - she will continue her statin therapy.  Mikle Bosworth.D.

## 2018-06-18 NOTE — Progress Notes (Signed)
u   Ana Salazar was seen today in the movement disorders clinic for neurologic consultation at the request of [name], MD.   The patient presents today for a second opinion regarding Parkinson's disease. She is accompanied by her daughter and sister who supplement the history.   I have reviewed an extensive number of her records from her prior neurologist, [name], and I appreciate those records.  The patient presented to [name] first in November, 2014, but reported that she had had symptoms for about a year and a half prior to that.  Her first symptoms consisted of difficulty moving her feet/shuffling the feet; trouble getting out of the bed; tremor (she thinks that it started in both but does state that the right is worse than the left and always has been).  The patient was started on a trial of levodopa at her first visit with [name] and followed up with him the next month and noted great improvement with the medication.  She was started on pramipexole the following visit but noted that it made her dizzy and it was discontinued over the telephone.  The following visit, she was started on entacapone 200 mg 3 times a day.  In April, 2015 her carbidopa/levodopa 25/100 was increased to 2 tablets 3 times per day.  In July, 2016 she was changed to carbidopa/levodopa 50/200, 3 times per day.  In October, 2016 she was placed on a combination of carbidopa/levodopa 50/200, 3 times a day (4-5am, noon, 8pm) in addition to carbidopa/levodopa 25/100, 2 tablets 3 times per day (1200 mg of levodopa per day).  She remains on the entacapone, 200 mg 3 times per day as well as primidone, 50 mg - 1.5 tablets at night, which she is using for tremor control.  She reports that her biggest frustration is that she is always moving and is more dizzy and is starting to have a few falls.  When she is nearing end of dose (1-2 hours prior) she will have tremor in her stomach, followed by her legs and arms.    10/13/15  update:  The patient is following care today, primarily for levodopa challenge.  She is currently off of medication.  She is accompanied by her daughter (different daughter than last visit) who supplements the history.  She last took her medication at 8pm.  Her medication generally consists of carbidopa/levodopa 50/200 3 times per day in addition to carbidopa/levodopa 25/100, 1 tablet 6 times per day.  Splitting the medication did help somewhat, but she thinks that it is not helping as much as it does initially when she first started doing it that way.  She is also on entacapone 200 mg 3 times per day and primidone 75 mg at night.  She has not had any falls since our last visit.  01/12/16 update:  The patient is following up today, as she called me to ask me about potentially decreasing her levodopa. She is accompanied by her sister who supplements the history.   She is on a very large dosage and somewhat of a strange combination, but came to me on this combination.  She is currently taking carbidopa/levodopa 50/200 3 times a day (5am/noon/8pm) and is now spreading out her carbidopa/levodopa 25/100 to one tablet 6 times per day (5am is the start and she takes that q 3 hrs until 8 pm).  She remains on entacapone 200 mg 3 times a day and primidone 75 mg daily.  She goes to  bed at about 8pm.  She has found that she is having more dyskinesia.  She has been working with rehabilitation therapist and I have gotten a couple of correspondence is from them that they have been concerned about labored breathing.   I advised follow-up with her primary care physician if this was true shortness of breath.  She did say that she wore a holter monitor for a month and that eval was negative.  Was told if continues needs pulm.  Only SOB when she is hot or exerting herself.  He also mentioned that they were concerned about inability to track inferiorly.  She did see [name] for her neuropsych testing, done on 12/22/2015.  She got  feedback from them on 01/05/2016.  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  I specifically spoke with [name], and she did not feel there was evidence of an atypical state from her testing.  She felt that she could potentially be a good DBS candidate.  She did recommend a vision evaluation, medication to help with anxiety/sleep/irritability, and a follow-up regarding a history of sleep apnea.  Pt states that she was already to the eye doctor in December at [facility].    02/02/16 update:  The patient is following up today, earlier than expected.  She is accompanied by her sister who supplements the history.  Last visit I changed her daytime extended release levodopa all over 2 immediate release.  Therefore, she was taking carbidopa/levodopa 25/100 as follows: 2 at 5am (she is waking up to take that pill and goes to bed until 7am/1 at 8am/1 at 11 am//2 at 2 pm/1 at 5pm and then would take an additional levodopa/levodopa 50/200 at 8pm.  This dropped her overall load of levodopa from 1200 mg to 900 mg, primarily because she was complaining about dyskinesia.  She called me on May 22 and stated that she was doing well initially, but now feels more weak and stiff.  When I asked her specifically, she admitted that she was being treated for urinary tract infection.  I told her to give that some time to resolve, because that can worsen the symptoms of Parkinson's.  She called the following day and requested a follow-up appointment here, which is the reason for follow-up.  She states that she is weaker, which means that the legs are "tired."  She denies freezing.  She states that overall she is more tired.  No falls.  She states that the sx's do not wax and wane throughout the day.  Her sister thinks that she is doing better but sister does state that she is "starting to get that shuffle."  Last PT was in mid April.  C/o nose dripping for years.    05/04/16 update:  The patient follows up today,  accompanied by her sister who supplements the history.  Last visit, the patient wanted to decrease her levodopa load, primarily because of dyskinesia.  We ended up reworking it significantly.  I changed her from the CR formulation to the immediate release formulation and did drop the overall load somewhat.  She is currently taking carbidopa/levodopa 25/100, 2 tablets at 7 AM/2 tablet at 10 AM/2 tablets at 1 PM/1 tablet at 4 PM/carbidopa/levodopa 50/200 at bedtime (8PM).  She cut out the 7pm dosage.    She takes entacapone 3 times per day, which turned out to be every other dose of levodopa.  She is still on primidone, but only on 50 mg  daily, which she was on prior to coming to see me.  She initally states that she isn't better but then states that dyskinesia is "a lot better than it was" which was the primary issue for changing.   She has had several falls that she attributes to looking down when she walks or getting dizzy when she first gets up.  Was able to get off of clonidine and losartan per PCP because of dizziness and that helped dizziness.  She hasn't gotten hurt with falls.   The big issue now is trouble sleeping.  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  She states that she was only using one time per day.  She also told me last visit that she was getting ready to have a sleep study for objective sleep apnea syndrome and a pulmonary consult for shortness of breath.  I do not have that information but she tells me that she does have osas and needs cpap titration and is awaiting that.  Denies depression but daughter thinks that she has it.  Pt doesn't have much to do during the day.  06/06/16 update: The patient follows up today, accompanied by her sister and daughter who supplement the history.  She is currently taking carbidopa/levodopa 25/100, 2 tablets at 7 AM/2 tablet at 10 AM/2 tablets at 1 PM/1 tablet at 4  PM/carbidopa/levodopa 50/200 at bedtime (8PM).  She is on entacapone 200 mg 3 times per day.  States that "my legs are really tired." Thinks that this is mostly in middle of the day/early AM and then "I spend the entire day trying to catch up."   Finds that if she takes an extra carbidopa/levodopa 25/100 in the middle of the night she does better.  Last visit, I discontinued her primidone, 50 mg daily.  She was complaining about vasomotor rhinorrhea last visit, and I told her to increase her atropine drops to 3 times a day as needed.  She states that she hasn't do that.  I started remeron last visit for depression and sleep.  Is still on celexa.  Remeron definitely helped sleep but got CPAP since our last visit as well.  Sister states that she is using that and is now screaming and hollaring and hitting the walls at night, but seems this was going on before CPAP.    07/21/16 update:  Pt returns again for levodopa challenge test.  2 of her daughters accompany her and supplement the history.  She is on carbidopa/levodopa 25/100, 2 tablets at 7 AM/2 tablet at 10 AM//2 tablets at 1 PM/1 tablet at 4 PM and then she will decide if she needs her last one tablet at 7 PM.  She also takes carbidopa/levodopa  50/200 at bedtime, which is 8 PM.  She is on comtan 200 mg tid.  She has been off of all PD med for about 60 hrs for this test.  She does feel more slowly and stiff, although she does feel less dizzy getting off of the medication.  10/25/16 update:  Pt returns today for pre-op visit.  She is accompanied by her daughter who supplements the history.  She is scheduled for DBS surgery next month.  She is on carbidopa/levodopa 25/100, 2/2/2/1 and sometimes will take another in middle of the night.  She takes carbidopa/levodopa 50/200 at bedtime.  She takes entacapone with 3 of the daytime dosages of levodopa.  She has had  some falls because not using her walker, often at night per daughter.  She is loving [employer].  Patient seen today in follow-up.  01/02/17 update:  Patient seen today in follow-up.  She is accompanied by her daughters who supplement the history.  She is status post bilateral STN DBS on 11/29/2016.  She had her generator placed on 12/07/2016.  She was in the hospital between the 2 surgeries with complaints of falls.  I had talked to Dr. Vertell Limber.  The patient had refused to go to subacute nursing facility for rehabilitation.  She had apparently also been refusing to use her walker.  We did get home health involved.  She has had a few falls since home health.  She fell on her butt this weekend and "it was the dogs fault."  Few other falls related to letting go of the walker.  Patient does think some of the falls were related to dizziness.  Her primary care physician has just cut back on her verapamil.  Some of issues that were being reported were due to family stress between patient and her sister.  She is now back on carbidopa/levodopa 25/100, 2/2/2/1 and entacapone with 3 of the dosages.  She is on carbidopa/levodopa 50/200 at bedtime.  She last took her PD med at [place] has not been great.  Family having difficulty understanding her.  Hasn't been doing ST with home health.  01/17/17 update:  Patient seen today in follow-up.  I activated her device on 01/02/2017.  She is currently on carbidopa/levodopa 25/100, 2 tablets at 7, 1 tablet at 10, 1 tablet at 1pm, 1 tablet at 4pm and she has d/c the carbidopa/levodopa 50/200 at night.  I discontinued her entacapone last visit.  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  She has been having some dyskinesia mostly with the L leg.  She is not stable and has had some falls in the house.  She has never had a fall with the walker but has had a fall without the walker.  Speech therapy just started last week.    02/03/17 update:  Patient seen today in follow-up.  Last visit, I decreased her medication and told her to  take carbidopa/levodopa 25/100, 1 tablets at 7am, 0.5 tablets at 11am, 3pm, 7pm.  While dyskinesia is better, she has had multiple falls.  With all of the falls, she has let go of the walker for some reason (getting out of shower, making a sandwich).  "I'm taking the walker more than I did."  She has never fallen when actually holding on to the walker.  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  05/23/17 update: patient seen today in follow-up for her Parkinson's disease.  She is accompanied by her daughter and sister who supplements the history.  Much has happened since our last visit.  I have reviewed hospital records made available to me.  She was admitted to the hospital on 04/17/2017 and discharged on 04/19/2017 with NSTEMI.  Hospital discharge records indicate that she was supposed to follow-up with Dr. Wynonia Lawman 2 weeks after discharge, but I don't see any appointment that she has attended, nor do I see an appointment made in the future.  They tell me that they have an appointment today.  She was given information for the adult daycare program.  She states that they have trouble getting her meds  ontime but the therapy helped.  It has also produced a great schedule for her, which has helped per family but patient doesn't like it.  Pt would like to return to [facility] as she feels weak. In regards to Parkinson's disease, she is on carbidopa/levodopa 25/100, one tablet at 7 AM and half a tablet at 11 AM/ on full tablet at 3 PM/ half tablet at 7 PM.  She is noting intermittent right arm tremor.  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    09/12/17 update: Patient seen today in follow-up for Parkinson's disease.  She is accompanied by her her daughter who supplements the history.  The patient is on carbidopa/levodopa 25/100, 1 tablet at 7 AM, 1 tablet at 11 AM, 1 tablet at 3 PM, half a tablet at 7 PM.  She remains on clonazepam 0.5 mg, half  tablet at night for REM behavior disorder.  I have reviewed records since her last visit.  She saw Dr. Wynonia Lawman the same day she saw me last visit.  She has had a long history of SVT.  He wanted her to see an electrophysiologist for consideration of possible ablation versus pacemaker.  She had a cardiac ablation on June 12, 2017 with Dr. Lovena Le.  Following that, it appears that she was having systolic PVCs or PACs and Toprol was prescribed.  She was given parameters.  She was told if systolic <517  hold hydralazine but take Toprol XL and lisinopril.  She was also advised to take her blood pressure before taking her evening hydralazine and lisinopril. she has had falls since our last visit.  They are happening about 1 time per week.  She feels it is due to "lightheadedness and my knees giving way."  Not related to timing of medication.  Always has walker.  Has not had PT but then states that she is doing some therapy at [facility].  Going to [facility] one day a week, but daughter states that she is giving excuses to stop going to that (such as stating that she has discharged her DBS system).  Trouble with insomnia but also just "laying around" when at home and not engaging in activities.  Complains of drooling, which has become excessive.  01/18/18 update: Patient is seen today in follow-up.  Records have been reviewed since our last visit.  She is currently on carbidopa/levodopa 25/100, 1 tablets at 7am, 1 tablets at 11am, 1 tablet at 3pm, half tablet at 7pm.  She was told to restart her clonazepam for REM behavior disorder.  She reports that she changed to melatonin.  She is using a bed rail.  Her daughter hears her yelling in sleep about 1 time per week. She was seen by cardiology in February.  She was told to wean off of the Toprol because of weakness and bradycardia.  She is feeling better in that regard.  She is having rare falls per daughter but pt estimates 1-2 times per month.  Using walker at all times.  She is  going to therapy and day program 5 days per week.  Charging device 5-6 days per week and taking 1.5 hours.   Daughter not noticing dyskinesia at all.   06/21/18 update: Patient is seen today in follow-up for Parkinson's disease, accompanied by her sister and daughter who supplements the history.  Patient attends a daycare facility during the day, and the NP there increase the patient's levodopa to 7 tablets/day from 3-1/2 tablets/day.  Obviously, this was a concern given patient's narrow window between dyskinesia and bradykinesia.  Pt reports today that she isn't taking the 7 tablets and is still taking the 3-1/2 tablets/day.  She has had several falls since last visit - "legs just give out."  Daughter states one time she bent over to pick up something and just kept going.  She reports that she generally is using a walker when this happens.  I have reviewed records from [facility] which were dated July 12 March 18, 2018.  Patient presented with difficulty with stating words and speech seemed slurred for 10 or 15 minutes.  She was evaluated by tele-neurology and was not a candidate for TPA as she was out of the therapeutic window.  In addition, her speech issues resolved very quickly.  Head CT was negative.  She could not have an MRI as the device she has was not MRI compatible at the time.  She had a CTA of the neck that demonstrated moderate 50% proximal ICA stenosis and none on the left.  Plavix was added and aspirin continued but she reports that ASA was d/c and she is just on Plavix.  Echocardiogram was complete and demonstrated left ventricular ejection fraction between 60 to 65% and mild left ventricular hypertrophy.   ALLERGIES:   Allergies  Allergen Reactions  . Amoxicillin Anaphylaxis, Swelling, Rash and Other (See Comments)    Has patient had a PCN reaction causing immediate rash, facial/tongue/throat swelling, SOB or lightheadedness with hypotension: Yes PCN reaction causing SEVERE RASH  INVOLVING MUCUS MEMBRANES or SKIN NECROSIS: Yes Has patient had a PCN reaction that required hospitalization: No Has patient had a PCN reaction occurring within the last 10 years: Yes   . Prednisone Shortness Of Breath, Nausea Only and Other (See Comments)    Hypertension   . Amlodipine Itching and Other (See Comments)    BURNING  . Levothyroxine Other (See Comments)    Tired, shaky, tremors  . Meloxicam Swelling and Other (See Comments)    "Burning "  . Nebivolol Rash  . Nsaids Nausea And Vomiting  . Valsartan Itching    CURRENT MEDICATIONS:  Outpatient Encounter Medications as of 06/21/2018  Medication Sig  . atorvastatin (LIPITOR) 40 MG tablet Take 1 tablet (40 mg total) by mouth daily at 6 PM. (Patient taking differently: Take 40 mg by mouth daily at 6 PM. 1900)  . carbidopa-levodopa (SINEMET IR) 25-100 MG tablet Take by mouth. Take 1 tablet by mouth at 7 am, 1 tablet at 11 am, 1 tablet at 3 pm and 1/2 tablet at 7 pm  . clopidogrel (PLAVIX) 75 MG tablet Take 75 mg by mouth daily.  . Fluticasone Furoate (ARNUITY ELLIPTA) 100 MCG/ACT AEPB Inhale 1 puff into the lungs daily.   . hydrALAZINE (APRESOLINE) 50 MG tablet Take 50 mg by mouth 3 (three) times daily.   Marland Kitchen lisinopril (PRINIVIL,ZESTRIL) 20 MG tablet Take 1 tablet (20 mg total) by mouth 2 (two) times daily.  . Melatonin 5 MG TABS Take 5 mg by mouth at bedtime.  . [DISCONTINUED] albuterol (PROVENTIL HFA;VENTOLIN HFA) 108 (90 Base) MCG/ACT inhaler Inhale 1 puff into the lungs every 6 (six) hours as needed for wheezing or shortness of breath.  . [DISCONTINUED] atropine 1 % ophthalmic solution Apply 1 drop to eye 2 (two) times daily.   No facility-administered encounter medications on file as of 06/21/2018.     PAST MEDICAL HISTORY:   Past Medical History:  Diagnosis Date  .  Anemia   . Anxiety    takes Klonopin as bedtime  . Asthma   . CHF (congestive heart failure) ([place])   . Depression    takes Citalopram daily  . Gout     . History of blood transfusion    "w/hysterectomy" (04/17/2017)  . Hypertension    takes Verapamil daily  . Hypothyroidism   . NSTEMI (non-ST elevated myocardial infarction) ([place]) 04/17/2017  . OSA on CPAP     "sitting on 5" (04/17/2017)  . Overactive bladder   . Parkinson's disease ([place])    takes Sinemet daily  . Peripheral edema    take Lasix as needed  . Pneumonia    "when I was a kid" (04/17/2017)  . Seasonal allergies   . Skin cancer    left elbow" (04/17/2017)  . Stroke [facility])    "weaker on my right side since; he last one I know of was in 2014" (04/17/2017)  . Tachycardia   . Urinary incontinence     PAST SURGICAL HISTORY:   Past Surgical History:  Procedure Laterality Date  . ABDOMINAL HYSTERECTOMY    . CESAREAN SECTION  1976  . LEFT HEART CATH AND CORONARY ANGIOGRAPHY N/A 04/18/2017   Procedure: LEFT HEART CATH AND CORONARY ANGIOGRAPHY;  Surgeon: Jettie Booze, MD;  Location: [facility];  Service: Cardiovascular;  Laterality: N/A;  . MINOR PLACEMENT OF FIDUCIAL N/A 11/22/2016   Procedure: Fiducial placement;  Surgeon: Erline Levine, MD;  Location: [place];  Service: Neurosurgery;  Laterality: N/A;  Fiducial placement  . PULSE GENERATOR IMPLANT Right 12/06/2016   Procedure: Unilateral PULSE GENERATOR IMPLANT;  Surgeon: Erline Levine, MD;  Location: [place] [facility];  Service: Neurosurgery;  Laterality: Right;  Unilateral PULSE GENERATOR IMPLANT  . RADIOLOGY WITH ANESTHESIA N/A 09/13/2016   Procedure: MRI BRAIN WITH AND WITHOUT;  Surgeon: Medication Radiologist, MD;  Location: [facility];  Service: Radiology;  Laterality: N/A;  . SKIN CANCER EXCISION Left    points to elbow  . SUBTHALAMIC STIMULATOR INSERTION Bilateral 11/29/2016   Procedure: Bilateral Deep brain stimulator placement;  Surgeon: Erline Levine, MD;  Location: [place];  Service: Neurosurgery;  Laterality: Bilateral;  Bilateral Deep brain stimulator placement  . SVT ABLATION N/A 06/12/2017   Procedure: SVT Ablation;   Surgeon: Evans Lance, MD;  Location: [facility];  Service: Cardiovascular;  Laterality: N/A;  . THYROIDECTOMY, PARTIAL    . TONSILLECTOMY    . TUBAL LIGATION    . UTERINE FIBROID SURGERY      SOCIAL HISTORY:   Social History   Socioeconomic History  . Marital status: Widowed    Spouse name: Not on file  . Number of children: Not on file  . Years of education: Not on file  . Highest education level: Not on file  Occupational History  . Occupation: retired  Scientific laboratory technician  . Financial resource strain: Not on file  . Food insecurity:    Worry: Not on file    Inability: Not on file  . Transportation needs:    Medical: Not on file    Non-medical: Not on file  Tobacco Use  . Smoking status: Former Smoker    Last attempt to quit: 09/27/2012    Years since quitting: 5.7  . Smokeless tobacco: Never Used  Substance and Sexual Activity  . Alcohol use: Yes    Alcohol/week: 0.0 standard drinks    Comment: 04/17/2017 "nothing since the 1980s"  . Drug use: No  . Sexual  activity: Never  Lifestyle  . Physical activity:    Days per week: Not on file    Minutes per session: Not on file  . Stress: Not on file  Relationships  . Social connections:    Talks on phone: Not on file    Gets together: Not on file    Attends religious service: Not on file    Active member of club or organization: Not on file    Attends meetings of clubs or organizations: Not on file    Relationship status: Not on file  . Intimate partner violence:    Fear of current or ex partner: Not on file    Emotionally abused: Not on file    Physically abused: Not on file    Forced sexual activity: Not on file  Other Topics Concern  . Not on file  Social History Narrative  . Not on file    FAMILY HISTORY:   Family Status  Relation Name Status  . Mother  Deceased       heart disease, DM, complications of fall  . Father  Deceased       MI  . Sister  Deceased       DM, CAD, renal failure  . Sister   Alive       healthy  . Child  Alive       healthy    ROS:  Review of Systems  Constitutional: Negative.   HENT: Negative.   Eyes: Negative.   Respiratory: Negative.   Cardiovascular: Negative.   Gastrointestinal: Negative.   Skin: Negative.   Neurological: Positive for tremors and weakness.     PHYSICAL EXAMINATION:    VITALS:   Vitals:   06/21/18 0956  BP: (!) 136/98  Pulse: 66  SpO2: 94%    GEN:  The patient appears stated age and is in NAD. HEENT:  Normocephalic, atraumatic.  The mucous membranes are moist. The superficial temporal arteries are without ropiness or tenderness. CV:  RRR Lungs:  CTAB Neck/HEME:  There are no carotid bruits bilaterally.  Neurological examination:  Orientation: The patient is alert and oriented x3. Cranial nerves: There is good facial symmetry. The speech is fluent and clear. Soft palate rises symmetrically and there is no tongue deviation. Hearing is intact to conversational tone. Sensation: Sensation is intact to light touch throughout Motor: Strength is 5/5 in the bilateral upper and lower extremities.   Shoulder shrug is equal and symmetric.  There is no pronator drift.  Movement examination: Tone: There is no rigidity.   Abnormal movements:  She has dyskinesia of the L leg.   Coordination: There isdecremation with any form of RAMS, including alternating supination and pronation of the forearm, hand opening and closing, finger taps, heel taps and toe taps on the left.   Gait and Station: she has to push off of the chair but walks very well in the hall when given a U step walker   Labs: On March 16, 2018,  white blood cells were 6.3, hemoglobin 12.8, hematocrit 37.2 and platelets were 170.  Sodium was 140, potassium 3.7, chloride 102, CO2 30, BUN 24, creatinine 0.7 and glucose 142.  Total cholesterol is 102.  LDL was 50.4.  Hemoglobin A1c 5.0.   DBS programming was performed today.    ASSESSMENT/PLAN:  1.  Probable idiopathic  Parkinson's disease, diagnosed in 2014 with symptoms dating back to early 2013.  -The patient is status post bilateral STN DBS with the [employer]  device on 11/29/2016 with IPG placed on 12/07/2016.  Her device was activated on 01/02/2017.  -She will continue carbidopa/levodopa 25/100, 1 tablets at 7am, 1 tablets at 11am, 1 tablet at 3pm, half tablet at 7pm   -I turned down the DBS in the left and turned it up on the right see if those things would help, but reminded her that DBS does not help with balance and the best thing for that is physical therapy.  A new prescription was written for that, but also told her that she cannot declined physical therapy or tell them that she is too tired for it (which sounds like it is happening currently).  -Patient/family are not happy at stay well.  There were given information to [facility].   2.  RBD and insomnia  -she d/c klonopin when started using bed rails.    -we will continue to hold remeron since that was primarily for sleep (it did work if need to come back to that)   3.  Vasomotor rhinitis  -Has atropine ophthalmic.  She needs to try and increase to tid.    4.  OSAS and SOB  -She has CPAP.  5.  Dizziness/Orthostatic hypotension  -somewhat improved.  Cardiology has been working with her BP.  Better after d/c toprol  7.  NSTEMI in 04/2017  -she has subsequently had a cardiac ablation for SVT.  8. Possible TIA in July, 2019  -We discussed the diagnosis as well as pathophysiology of the disease.  We discussed signs and sx's of stroke and importance of calling 911 immediately should she have any of these.  Patient education was provided.    -Talked about stroke risk factors which are modifiable.  -Talked about importance of blood pressure control with a goal <130/80 mm Hg.   -Talked about importance of lipid control and proper diet.  Lipids should be managed intensively, with a goal LDL < 70 mg/dL.  She is on Lipitor and LDL at  goal  -Patient is now greater than 3 months from stroke.  She is on Plavix monotherapy.  -I counseled the patient on measures to reduce stroke risk, including the importance of medication compliance, risk factor control, exercise, healthy diet, and avoidance of smoking.

## 2018-06-21 ENCOUNTER — Encounter: Payer: Self-pay | Admitting: Neurology

## 2018-06-21 ENCOUNTER — Ambulatory Visit (INDEPENDENT_AMBULATORY_CARE_PROVIDER_SITE_OTHER): Payer: PRIVATE HEALTH INSURANCE | Admitting: Neurology

## 2018-06-21 ENCOUNTER — Encounter

## 2018-06-21 VITALS — BP 136/98 | HR 66

## 2018-06-21 DIAGNOSIS — G20 Parkinson's disease: Secondary | ICD-10-CM | POA: Diagnosis not present

## 2018-06-21 DIAGNOSIS — R2681 Unsteadiness on feet: Secondary | ICD-10-CM | POA: Diagnosis not present

## 2018-06-21 DIAGNOSIS — G459 Transient cerebral ischemic attack, unspecified: Secondary | ICD-10-CM | POA: Diagnosis not present

## 2018-06-21 NOTE — Procedures (Signed)
DBS Programming was performed on [employer]  Total time spent programming was 10 minutes.  Device was found to be on and adequately charged.   Impedences were checked and were within normal limits (all under 1091).  Battery was checked and was determined to be functioning normally and not near the end of life.    Final settings were as follows:  Left brain electrode:     2-C+           ; Amplitude  2.2 ; Pulse width 60 microseconds;   Frequency   130   Hz.   Right brain electrode:     11-C+          ; Amplitude   2.0  mAmps ;  Pulse width 60  microseconds;  Frequency   130    Hz.

## 2018-06-25 ENCOUNTER — Telehealth: Payer: Self-pay | Admitting: Neurology

## 2018-06-25 NOTE — Telephone Encounter (Signed)
Patient has been scheduled to come in

## 2018-06-25 NOTE — Telephone Encounter (Signed)
Called Rod Holler back and she said that since patient's adjustment on Friday, she has had a lot more movement on the left side.  Please advise.

## 2018-06-25 NOTE — Telephone Encounter (Signed)
If we can bring her in thurs at 1 or wed at 10:45, I can turn it down (60 min, dbs slot).

## 2018-06-25 NOTE — Telephone Encounter (Signed)
Patient's family member called and left a voicemail that she wanted to speak with a nurse. Please call her back at [phone]. Thanks!

## 2018-06-26 NOTE — Progress Notes (Signed)
u   Ana Salazar was seen today in the movement disorders clinic for neurologic consultation at the request of Ana Overly, MD.   The patient presents today for a second opinion regarding Parkinson's disease. She is accompanied by her daughter and sister who supplement the history.   I have reviewed an extensive number of her records from her prior neurologist, Dr. Blenda Salazar, and I appreciate those records.  The patient presented to Dr. Blenda Salazar first in November, 2014, but reported that she had had symptoms for about a year and a half prior to that.  Her first symptoms consisted of difficulty moving her feet/shuffling the feet; trouble getting out of the bed; tremor (she thinks that it started in both but does state that the right is worse than the left and always has been).  The patient was started on a trial of levodopa at her first visit with Dr. Blenda Salazar and followed up with him the next month and noted great improvement with the medication.  She was started on pramipexole the following visit but noted that it made her dizzy and it was discontinued over the telephone.  The following visit, she was started on entacapone 200 mg 3 times a day.  In April, 2015 her carbidopa/levodopa 25/100 was increased to 2 tablets 3 times per day.  In July, 2016 she was changed to carbidopa/levodopa 50/200, 3 times per day.  In October, 2016 she was placed on a combination of carbidopa/levodopa 50/200, 3 times a day (4-5am, noon, 8pm) in addition to carbidopa/levodopa 25/100, 2 tablets 3 times per day (1200 mg of levodopa per day).  She remains on the entacapone, 200 mg 3 times per day as well as primidone, 50 mg - 1.5 tablets at night, which she is using for tremor control.  She reports that her biggest frustration is that she is always moving and is more dizzy and is starting to have a few falls.  When she is nearing end of dose (1-2 hours prior) she will have tremor in her stomach, followed by her legs and arms.    10/13/15  update:  The patient is following care today, primarily for levodopa challenge.  She is currently off of medication.  She is accompanied by her daughter (different daughter than last visit) who supplements the history.  She last took her medication at 8pm.  Her medication generally consists of carbidopa/levodopa 50/200 3 times per day in addition to carbidopa/levodopa 25/100, 1 tablet 6 times per day.  Splitting the medication did help somewhat, but she thinks that it is not helping as much as it does initially when she first started doing it that way.  She is also on entacapone 200 mg 3 times per day and primidone 75 mg at night.  She has not had any falls since our last visit.  01/12/16 update:  The patient is following up today, as she called me to ask me about potentially decreasing her levodopa. She is accompanied by her sister who supplements the history.   She is on a very large dosage and somewhat of a strange combination, but came to me on this combination.  She is currently taking carbidopa/levodopa 50/200 3 times a day (5am/noon/8pm) and is now spreading out her carbidopa/levodopa 25/100 to one tablet 6 times per day (5am is the start and she takes that q 3 hrs until 8 pm).  She remains on entacapone 200 mg 3 times a day and primidone 75 mg daily.  She goes to  bed at about 8pm.  She has found that she is having more dyskinesia.  She has been working with rehabilitation therapist and I have gotten a couple of correspondence is from them that they have been concerned about labored breathing.   I advised follow-up with her primary care physician if this was true shortness of breath.  She did say that she wore a holter monitor for a month and that eval was negative.  Was told if continues needs pulm.  Only SOB when she is hot or exerting herself.  He also mentioned that they were concerned about inability to track inferiorly.  She did see Dr. Leonides Salazar for her neuropsych testing, done on 12/22/2015.  She got  feedback from them on 01/05/2016.  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  I specifically spoke with Dr. Leonides Salazar, and she did not feel there was evidence of an atypical state from her testing.  She felt that she could potentially be a good DBS candidate.  She did recommend a vision evaluation, medication to help with anxiety/sleep/irritability, and a follow-up regarding a history of sleep apnea.  Pt states that she was already to the eye doctor in December at [facility].    02/02/16 update:  The patient is following up today, earlier than expected.  She is accompanied by her sister who supplements the history.  Last visit I changed her daytime extended release levodopa all over 2 immediate release.  Therefore, she was taking carbidopa/levodopa 25/100 as follows: 2 at 5am (she is waking up to take that pill and goes to bed until 7am/1 at 8am/1 at 11 am//2 at 2 pm/1 at 5pm and then would take an additional levodopa/levodopa 50/200 at 8pm.  This dropped her overall load of levodopa from 1200 mg to 900 mg, primarily because she was complaining about dyskinesia.  She called me on May 22 and stated that she was doing well initially, but now feels more weak and stiff.  When I asked her specifically, she admitted that she was being treated for urinary tract infection.  I told her to give that some time to resolve, because that can worsen the symptoms of Parkinson's.  She called the following day and requested a follow-up appointment here, which is the reason for follow-up.  She states that she is weaker, which means that the legs are "tired."  She denies freezing.  She states that overall she is more tired.  No falls.  She states that the sx's do not wax and wane throughout the day.  Her sister thinks that she is doing better but sister does state that she is "starting to get that shuffle."  Last PT was in mid April.  C/o nose dripping for years.    05/04/16 update:  The patient follows up today,  accompanied by her sister who supplements the history.  Last visit, the patient wanted to decrease her levodopa load, primarily because of dyskinesia.  We ended up reworking it significantly.  I changed her from the CR formulation to the immediate release formulation and did drop the overall load somewhat.  She is currently taking carbidopa/levodopa 25/100, 2 tablets at 7 AM/2 tablet at 10 AM/2 tablets at 1 PM/1 tablet at 4 PM/carbidopa/levodopa 50/200 at bedtime (8PM).  She cut out the 7pm dosage.    She takes entacapone 3 times per day, which turned out to be every other dose of levodopa.  She is still on primidone, but only on 50 mg  daily, which she was on prior to coming to see me.  She initally states that she isn't better but then states that dyskinesia is "a lot better than it was" which was the primary issue for changing.   She has had several falls that she attributes to looking down when she walks or getting dizzy when she first gets up.  Was able to get off of clonidine and losartan per PCP because of dizziness and that helped dizziness.  She hasn't gotten hurt with falls.   The big issue now is trouble sleeping.  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  She states that she was only using one time per day.  She also told me last visit that she was getting ready to have a sleep study for objective sleep apnea syndrome and a pulmonary consult for shortness of breath.  I do not have that information but she tells me that she does have osas and needs cpap titration and is awaiting that.  Denies depression but daughter thinks that she has it.  Pt doesn't have much to do during the day.  06/06/16 update: The patient follows up today, accompanied by her sister and daughter who supplement the history.  She is currently taking carbidopa/levodopa 25/100, 2 tablets at 7 AM/2 tablet at 10 AM/2 tablets at 1 PM/1 tablet at 4  PM/carbidopa/levodopa 50/200 at bedtime (8PM).  She is on entacapone 200 mg 3 times per day.  States that "my legs are really tired." Thinks that this is mostly in middle of the day/early AM and then "I spend the entire day trying to catch up."   Finds that if she takes an extra carbidopa/levodopa 25/100 in the middle of the night she does better.  Last visit, I discontinued her primidone, 50 mg daily.  She was complaining about vasomotor rhinorrhea last visit, and I told her to increase her atropine drops to 3 times a day as needed.  She states that she hasn't do that.  I started remeron last visit for depression and sleep.  Is still on celexa.  Remeron definitely helped sleep but got CPAP since our last visit as well.  Sister states that she is using that and is now screaming and hollaring and hitting the walls at night, but seems this was going on before CPAP.    07/21/16 update:  Pt returns again for levodopa challenge test.  2 of her daughters accompany her and supplement the history.  She is on carbidopa/levodopa 25/100, 2 tablets at 7 AM/2 tablet at 10 AM//2 tablets at 1 PM/1 tablet at 4 PM and then she will decide if she needs her last one tablet at 7 PM.  She also takes carbidopa/levodopa  50/200 at bedtime, which is 8 PM.  She is on comtan 200 mg tid.  She has been off of all PD med for about 60 hrs for this test.  She does feel more slowly and stiff, although she does feel less dizzy getting off of the medication.  10/25/16 update:  Pt returns today for pre-op visit.  She is accompanied by her daughter who supplements the history.  She is scheduled for DBS surgery next month.  She is on carbidopa/levodopa 25/100, 2/2/2/1 and sometimes will take another in middle of the night.  She takes carbidopa/levodopa 50/200 at bedtime.  She takes entacapone with 3 of the daytime dosages of levodopa.  She has had  some falls because not using her walker, often at night per daughter.  She is loving Press [employer].  Patient seen today in follow-up.  01/02/17 update:  Patient seen today in follow-up.  She is accompanied by her daughters who supplement the history.  She is status post bilateral STN DBS on 11/29/2016.  She had her generator placed on 12/07/2016.  She was in the hospital between the 2 surgeries with complaints of falls.  I had talked to Dr. Vertell Limber.  The patient had refused to go to subacute nursing facility for rehabilitation.  She had apparently also been refusing to use her walker.  We did get home health involved.  She has had a few falls since home health.  She fell on her butt this weekend and "it was the dogs fault."  Few other falls related to letting go of the walker.  Patient does think some of the falls were related to dizziness.  Her primary care physician has just cut back on her verapamil.  Some of issues that were being reported were due to family stress between patient and her sister.  She is now back on carbidopa/levodopa 25/100, 2/2/2/1 and entacapone with 3 of the dosages.  She is on carbidopa/levodopa 50/200 at bedtime.  She last took her PD med at [place] has not been great.  Family having difficulty understanding her.  Hasn't been doing ST with home health.  01/17/17 update:  Patient seen today in follow-up.  I activated her device on 01/02/2017.  She is currently on carbidopa/levodopa 25/100, 2 tablets at 7, 1 tablet at 10, 1 tablet at 1pm, 1 tablet at 4pm and she has d/c the carbidopa/levodopa 50/200 at night.  I discontinued her entacapone last visit.  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  She has been having some dyskinesia mostly with the L leg.  She is not stable and has had some falls in the house.  She has never had a fall with the walker but has had a fall without the walker.  Speech therapy just started last week.    02/03/17 update:  Patient seen today in follow-up.  Last visit, I decreased her medication and told her to  take carbidopa/levodopa 25/100, 1 tablets at 7am, 0.5 tablets at 11am, 3pm, 7pm.  While dyskinesia is better, she has had multiple falls.  With all of the falls, she has let go of the walker for some reason (getting out of shower, making a sandwich).  "I'm taking the walker more than I did."  She has never fallen when actually holding on to the walker.  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  05/23/17 update: patient seen today in follow-up for her Parkinson's disease.  She is accompanied by her daughter and sister who supplements the history.  Much has happened since our last visit.  I have reviewed hospital records made available to me.  She was admitted to the hospital on 04/17/2017 and discharged on 04/19/2017 with NSTEMI.  Hospital discharge records indicate that she was supposed to follow-up with Dr. Wynonia Lawman 2 weeks after discharge, but I don't see any appointment that she has attended, nor do I see an appointment made in the future.  They tell me that they have an appointment today.  She was given information for the adult daycare program.  She states that they have trouble getting her meds  ontime but the therapy helped.  It has also produced a great schedule for her, which has helped per family but patient doesn't like it.  Pt would like to return to [facility] as she feels weak. In regards to Parkinson's disease, she is on carbidopa/levodopa 25/100, one tablet at 7 AM and half a tablet at 11 AM/ on full tablet at 3 PM/ half tablet at 7 PM.  She is noting intermittent right arm tremor.  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    09/12/17 update: Patient seen today in follow-up for Parkinson's disease.  She is accompanied by her her daughter who supplements the history.  The patient is on carbidopa/levodopa 25/100, 1 tablet at 7 AM, 1 tablet at 11 AM, 1 tablet at 3 PM, half a tablet at 7 PM.  She remains on clonazepam 0.5 mg, half  tablet at night for REM behavior disorder.  I have reviewed records since her last visit.  She saw Dr. Wynonia Lawman the same day she saw me last visit.  She has had a long history of SVT.  He wanted her to see an electrophysiologist for consideration of possible ablation versus pacemaker.  She had a cardiac ablation on June 12, 2017 with Dr. Lovena Le.  Following that, it appears that she was having systolic PVCs or PACs and Toprol was prescribed.  She was given parameters.  She was told if systolic <517  hold hydralazine but take Toprol XL and lisinopril.  She was also advised to take her blood pressure before taking her evening hydralazine and lisinopril. she has had falls since our last visit.  They are happening about 1 time per week.  She feels it is due to "lightheadedness and my knees giving way."  Not related to timing of medication.  Always has walker.  Has not had PT but then states that she is doing some therapy at [facility].  Going to [facility] one day a week, but daughter states that she is giving excuses to stop going to that (such as stating that she has discharged her DBS system).  Trouble with insomnia but also just "laying around" when at home and not engaging in activities.  Complains of drooling, which has become excessive.  01/18/18 update: Patient is seen today in follow-up.  Records have been reviewed since our last visit.  She is currently on carbidopa/levodopa 25/100, 1 tablets at 7am, 1 tablets at 11am, 1 tablet at 3pm, half tablet at 7pm.  She was told to restart her clonazepam for REM behavior disorder.  She reports that she changed to melatonin.  She is using a bed rail.  Her daughter hears her yelling in sleep about 1 time per week. She was seen by cardiology in February.  She was told to wean off of the Toprol because of weakness and bradycardia.  She is feeling better in that regard.  She is having rare falls per daughter but pt estimates 1-2 times per month.  Using walker at all times.  She is  going to therapy and day program 5 days per week.  Charging device 5-6 days per week and taking 1.5 hours.   Daughter not noticing dyskinesia at all.   06/21/18 update: Patient is seen today in follow-up for Parkinson's disease, accompanied by her sister and daughter who supplements the history.  Patient attends a daycare facility during the day, and the NP there increase the patient's levodopa to 7 tablets/day from 3-1/2 tablets/day.  Obviously, this was a concern given patient's narrow window between dyskinesia and bradykinesia.  Pt reports today that she isn't taking the 7 tablets and is still taking the 3-1/2 tablets/day.  She has had several falls since last visit - "legs just give out."  Daughter states one time she bent over to pick up something and just kept going.  She reports that she generally is using a walker when this happens.  I have reviewed records from [facility] which were dated July 12 March 18, 2018.  Patient presented with difficulty with stating words and speech seemed slurred for 10 or 15 minutes.  She was evaluated by tele-neurology and was not a candidate for TPA as she was out of the therapeutic window.  In addition, her speech issues resolved very quickly.  Head CT was negative.  She could not have an MRI as the device she has was not MRI compatible at the time.  She had a CTA of the neck that demonstrated moderate 50% proximal ICA stenosis and none on the left.  Plavix was added and aspirin continued but she reports that ASA was d/c and she is just on Plavix.  Echocardiogram was complete and demonstrated left ventricular ejection fraction between 60 to 65% and mild left ventricular hypertrophy.  06/28/18 update: Patient worked in today.  Patient is accompanied by her daughter who supplements history.  Was noting more dyskinesia after adjustment last week.  Patient reports she had significant dyskinesia over the weekend, but reports that it has been much better over the last 2  days.  She had one fall on Monday.  She had just gotten up off of the toilet and had not yet grabbed the walker and fell.   ALLERGIES:   Allergies  Allergen Reactions  . Amoxicillin Anaphylaxis, Swelling, Rash and Other (See Comments)    Has patient had a PCN reaction causing immediate rash, facial/tongue/throat swelling, SOB or lightheadedness with hypotension: Yes PCN reaction causing SEVERE RASH INVOLVING MUCUS MEMBRANES or SKIN NECROSIS: Yes Has patient had a PCN reaction that required hospitalization: No Has patient had a PCN reaction occurring within the last 10 years: Yes   . Prednisone Shortness Of Breath, Nausea Only and Other (See Comments)    Hypertension   . Amlodipine Itching and Other (See Comments)    BURNING  . Levothyroxine Other (See Comments)    Tired, shaky, tremors  . Meloxicam Swelling and Other (See Comments)    "Burning "  . Nebivolol Rash  . Nsaids Nausea And Vomiting  . Valsartan Itching    CURRENT MEDICATIONS:  Outpatient Encounter Medications as of 06/28/2018  Medication Sig  . atorvastatin (LIPITOR) 40 MG tablet Take 1 tablet (40 mg total) by mouth daily at 6 PM. (Patient taking differently: Take 40 mg by mouth daily at 6 PM. 1900)  . carbidopa-levodopa (SINEMET IR) 25-100 MG tablet Take by mouth. Take 1 tablet by mouth at 7 am, 1 tablet at 11 am, 1 tablet at 3 pm and 1/2 tablet at 7 pm  . clopidogrel (PLAVIX) 75 MG tablet Take 75 mg by mouth daily.  . Fluticasone Furoate (ARNUITY ELLIPTA) 100 MCG/ACT AEPB Inhale 1 puff into the lungs daily.   . hydrALAZINE (APRESOLINE) 50 MG tablet Take 50 mg by mouth 3 (three) times daily.   Marland Kitchen lisinopril (PRINIVIL,ZESTRIL) 20 MG tablet Take 1 tablet (20 mg total) by mouth 2 (two) times daily.  . Melatonin 5 MG TABS Take 5 mg by mouth at bedtime.  No facility-administered encounter medications on file as of 06/28/2018.     PAST MEDICAL HISTORY:   Past Medical History:  Diagnosis Date  . Anemia   . Anxiety     takes Klonopin as bedtime  . Asthma   . CHF (congestive heart failure) ([place])   . Depression    takes Citalopram daily  . Gout   . History of blood transfusion    "w/hysterectomy" (04/17/2017)  . Hypertension    takes Verapamil daily  . Hypothyroidism   . NSTEMI (non-ST elevated myocardial infarction) ([place]) 04/17/2017  . OSA on CPAP     "sitting on 5" (04/17/2017)  . Overactive bladder   . Parkinson's disease ([place])    takes Sinemet daily  . Peripheral edema    take Lasix as needed  . Pneumonia    "when I was a kid" (04/17/2017)  . Seasonal allergies   . Skin cancer    left elbow" (04/17/2017)  . Stroke [facility])    "weaker on my right side since; he last one I know of was in 2014" (04/17/2017)  . Tachycardia   . Urinary incontinence     PAST SURGICAL HISTORY:   Past Surgical History:  Procedure Laterality Date  . ABDOMINAL HYSTERECTOMY    . CESAREAN SECTION  1976  . LEFT HEART CATH AND CORONARY ANGIOGRAPHY N/A 04/18/2017   Procedure: LEFT HEART CATH AND CORONARY ANGIOGRAPHY;  Surgeon: Jettie Booze, MD;  Location: [place] [facility];  Service: Cardiovascular;  Laterality: N/A;  . MINOR PLACEMENT OF FIDUCIAL N/A 11/22/2016   Procedure: Fiducial placement;  Surgeon: Erline Levine, MD;  Location: [facility];  Service: Neurosurgery;  Laterality: N/A;  Fiducial placement  . PULSE GENERATOR IMPLANT Right 12/06/2016   Procedure: Unilateral PULSE GENERATOR IMPLANT;  Surgeon: Erline Levine, MD;  Location: [facility];  Service: Neurosurgery;  Laterality: Right;  Unilateral PULSE GENERATOR IMPLANT  . RADIOLOGY WITH ANESTHESIA N/A 09/13/2016   Procedure: MRI BRAIN WITH AND WITHOUT;  Surgeon: Medication Radiologist, MD;  Location: [facility];  Service: Radiology;  Laterality: N/A;  . SKIN CANCER EXCISION Left    points to elbow  . SUBTHALAMIC STIMULATOR INSERTION Bilateral 11/29/2016   Procedure: Bilateral Deep brain stimulator placement;  Surgeon: Erline Levine, MD;  Location: [facility];  Service: Neurosurgery;   Laterality: Bilateral;  Bilateral Deep brain stimulator placement  . SVT ABLATION N/A 06/12/2017   Procedure: SVT Ablation;  Surgeon: Evans Lance, MD;  Location: [facility];  Service: Cardiovascular;  Laterality: N/A;  . THYROIDECTOMY, PARTIAL    . TONSILLECTOMY    . TUBAL LIGATION    . UTERINE FIBROID SURGERY      SOCIAL HISTORY:   Social History   Socioeconomic History  . Marital status: Widowed    Spouse name: Not on file  . Number of children: Not on file  . Years of education: Not on file  . Highest education level: Not on file  Occupational History  . Occupation: retired  Scientific laboratory technician  . Financial resource strain: Not on file  . Food insecurity:    Worry: Not on file    Inability: Not on file  . Transportation needs:    Medical: Not on file    Non-medical: Not on file  Tobacco Use  . Smoking status: Former Smoker    Last attempt to quit: 09/27/2012    Years since quitting: 5.7  . Smokeless tobacco: Never Used  Substance and Sexual Activity  . Alcohol use:  Yes    Alcohol/week: 0.0 standard drinks    Comment: 04/17/2017 "nothing since the 1980s"  . Drug use: No  . Sexual activity: Never  Lifestyle  . Physical activity:    Days per week: Not on file    Minutes per session: Not on file  . Stress: Not on file  Relationships  . Social connections:    Talks on phone: Not on file    Gets together: Not on file    Attends religious service: Not on file    Active member of club or organization: Not on file    Attends meetings of clubs or organizations: Not on file    Relationship status: Not on file  . Intimate partner violence:    Fear of current or ex partner: Not on file    Emotionally abused: Not on file    Physically abused: Not on file    Forced sexual activity: Not on file  Other Topics Concern  . Not on file  Social History Narrative  . Not on file    FAMILY HISTORY:   Family Status  Relation Name Status  . Mother  Deceased       heart  disease, DM, complications of fall  . Father  Deceased       MI  . Sister  Deceased       DM, CAD, renal failure  . Sister  Alive       healthy  . Child  Alive       healthy    ROS:  Review of Systems  Constitutional: Negative.   HENT: Negative.   Musculoskeletal: Positive for falls.  Skin: Negative.   Neurological: Positive for speech change.  Endo/Heme/Allergies: Negative.      PHYSICAL EXAMINATION:    VITALS:   Vitals:   06/28/18 1304  BP: (!) 156/92  Pulse: 66  SpO2: 99%    GEN:  The patient appears stated age and is in NAD. HEENT:  Normocephalic, atraumatic.  The mucous membranes are moist. The superficial temporal arteries are without ropiness or tenderness. CV:  RRR Lungs:  CTAB Neck/HEME:  There are no carotid bruits bilaterally.  Neurological examination:  Orientation: The patient is alert and oriented x3. Cranial nerves: There is good facial symmetry. The speech is fluent and dysarthric. Soft palate rises symmetrically and there is no tongue deviation. Hearing is intact to conversational tone. Sensation: Sensation is intact to light touch throughout Motor: Strength is 5/5 in the bilateral upper and lower extremities.   Shoulder shrug is equal and symmetric.  There is no pronator drift.  Movement examination: Tone: There is no rigidity.   Abnormal movements:  She has mild dyskinesia of the left leg Coordination: There isdecremation with any form of RAMS, including alternating supination and pronation of the forearm, hand opening and closing, finger taps, heel taps and toe taps on the left.   Gait and Station: she has to push off of the chair but walks very well in the hall when given a walker.   Labs: On March 16, 2018,  white blood cells were 6.3, hemoglobin 12.8, hematocrit 37.2 and platelets were 170.  Sodium was 140, potassium 3.7, chloride 102, CO2 30, BUN 24, creatinine 0.7 and glucose 142.  Total cholesterol is 102.  LDL was 50.4.  Hemoglobin A1c  5.0.   DBS programming was performed today.    ASSESSMENT/PLAN:  1.  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  -The patient is status post bilateral STN DBS with the [facility] [employer] device on 11/29/2016 with IPG placed on 12/07/2016.  Her device was activated on 01/02/2017.  -She will continue carbidopa/levodopa 25/100, 1 tablets at 7am, 1 tablets at 11am, 1 tablet at 3pm, half tablet at 7pm   -I gave the patient 2 different programs on her DBS.  This is described in more detail on a separate programming procedure notes.  She was shown how to change between the 2 programs, as was her daughter.   2.  RBD and insomnia  -she d/c klonopin when started using bed rails.    -we will continue to hold remeron since that was primarily for sleep (it did work if need to come back to that)   3.  Vasomotor rhinitis  -Has atropine ophthalmic.  She needs to try and increase to tid.    4.  OSAS and SOB  -She has CPAP.  5.  Dizziness/Orthostatic hypotension  -somewhat improved.  Cardiology has been working with her BP.  Better after d/c toprol  6.  NSTEMI in 04/2017  -she has subsequently had a cardiac ablation for SVT.  7. Possible TIA in July, 2019  -We discussed the diagnosis as well as pathophysiology of the disease.  We discussed signs and sx's of stroke and importance of calling 911 immediately should she have any of these.  Patient education was provided.    -Talked about stroke risk factors which are modifiable.  -Talked about importance of blood pressure control with a goal <130/80 mm Hg.   -Talked about importance of lipid control and proper diet.  Lipids should be managed intensively, with a goal LDL < 70 mg/dL.  She is on Lipitor and LDL at goal  -Patient is now greater than 3 months from stroke.  She is on Plavix monotherapy.  -I counseled the patient on measures to reduce stroke risk, including the importance of medication  compliance, risk factor control, exercise, healthy diet, and avoidance of smoking.    8.  Patient has a follow-up in February and I will see her then.

## 2018-06-28 ENCOUNTER — Encounter: Payer: Self-pay | Admitting: Neurology

## 2018-06-28 ENCOUNTER — Ambulatory Visit (INDEPENDENT_AMBULATORY_CARE_PROVIDER_SITE_OTHER): Payer: PRIVATE HEALTH INSURANCE | Admitting: Neurology

## 2018-06-28 DIAGNOSIS — G20 Parkinson's disease: Secondary | ICD-10-CM

## 2018-06-28 NOTE — Procedures (Signed)
DBS Programming was performed on [employer] Device  Total time spent programming was 45 minutes.  Device was found to be on and adequately charged.   Impedences were checked and were within normal limits (all under 1057).  Battery was checked and was determined to be functioning normally and not near the end of life.    Final settings were as follows:  Program 1:  Left brain electrode:     2-(50%) 3-(50%) C+           ; Amplitude  2.2 ; Pulse width 50 microseconds;   Frequency   130   Hz.   Right brain electrode:     11-C+          ; Amplitude   1.8  mAmps ;  Pulse width 60  microseconds;  Frequency   130    Hz.  Program 2:  Left brain electrode:     2-C+           ; Amplitude  2.2 ; Pulse width 60 microseconds;   Frequency   130   Hz.   Right brain electrode:     11-C+          ; Amplitude   1.8  mAmps ;  Pulse width 60  microseconds;  Frequency   130    Hz.  Program 1 was active when patient left.

## 2018-07-11 ENCOUNTER — Telehealth: Payer: Self-pay | Admitting: Neurology

## 2018-07-11 NOTE — Telephone Encounter (Signed)
Patient daughter wants to speak with someone about her mother's last visit please call

## 2018-07-11 NOTE — Telephone Encounter (Signed)
Spoke with patient's daughter. She had questions about physical therapy. She states this was ordered by [place], but she feels like this isn't being done at [facility]. She feels like they are just keeping her mom in the wheelchair all day. I encouraged her to have a conversation with the facility directly, as it sounds like she hasn't spoken to anyone there about this or about physical therapy available goals and availability in the program.   She also had questions about [facility]. She called about their day program and was told it was only for memory patients. I told her I don't much about this, but would send a message to our social worker and ask if she is familiar with the day program. Janett Billow - please advise.

## 2018-07-12 ENCOUNTER — Telehealth: Payer: Self-pay | Admitting: Neurology

## 2018-07-12 NOTE — Telephone Encounter (Signed)
Vickie with [facility] called [phone] to let us know that patient felt like she was pulling to the right yesterday. She found out she changed her setting on her stimulator - her daughter changed them back last night and she is fine today. She wanted to make [place] aware.  Dr. Carles Collet Juluis Rainier.

## 2018-07-16 ENCOUNTER — Telehealth: Payer: Self-pay | Admitting: Psychology

## 2018-07-16 NOTE — Telephone Encounter (Signed)
Telephone call to patient's daughter.  Left a message indicating that I would be more than willing to help her with some resources as it appears that she is looking into some at this time.  Left my contact information and asked her to give me a call back.

## 2018-10-12 NOTE — Progress Notes (Signed)
u   Ana Salazar was seen today in the movement disorders clinic for neurologic consultation at the request of [name], MD.   The patient presents today for a second opinion regarding Parkinson's disease. She is accompanied by her daughter and sister who supplement the history.   I have reviewed an extensive number of her records from her prior neurologist, [name], and I appreciate those records.  The patient presented to [name] first in November, 2014, but reported that she had had symptoms for about a year and a half prior to that.  Her first symptoms consisted of difficulty moving her feet/shuffling the feet; trouble getting out of the bed; tremor (she thinks that it started in both but does state that the right is worse than the left and always has been).  The patient was started on a trial of levodopa at her first visit with [name] and followed up with him the next month and noted great improvement with the medication.  She was started on pramipexole the following visit but noted that it made her dizzy and it was discontinued over the telephone.  The following visit, she was started on entacapone 200 mg 3 times a day.  In April, 2015 her carbidopa/levodopa 25/100 was increased to 2 tablets 3 times per day.  In July, 2016 she was changed to carbidopa/levodopa 50/200, 3 times per day.  In October, 2016 she was placed on a combination of carbidopa/levodopa 50/200, 3 times a day (4-5am, noon, 8pm) in addition to carbidopa/levodopa 25/100, 2 tablets 3 times per day (1200 mg of levodopa per day).  She remains on the entacapone, 200 mg 3 times per day as well as primidone, 50 mg - 1.5 tablets at night, which she is using for tremor control.  She reports that her biggest frustration is that she is always moving and is more dizzy and is starting to have a few falls.  When she is nearing end of dose (1-2 hours prior) she will have tremor in her stomach, followed by her legs and arms.    10/13/15  update:  The patient is following care today, primarily for levodopa challenge.  She is currently off of medication.  She is accompanied by her daughter (different daughter than last visit) who supplements the history.  She last took her medication at 8pm.  Her medication generally consists of carbidopa/levodopa 50/200 3 times per day in addition to carbidopa/levodopa 25/100, 1 tablet 6 times per day.  Splitting the medication did help somewhat, but she thinks that it is not helping as much as it does initially when she first started doing it that way.  She is also on entacapone 200 mg 3 times per day and primidone 75 mg at night.  She has not had any falls since our last visit.  01/12/16 update:  The patient is following up today, as she called me to ask me about potentially decreasing her levodopa. She is accompanied by her sister who supplements the history.   She is on a very large dosage and somewhat of a strange combination, but came to me on this combination.  She is currently taking carbidopa/levodopa 50/200 3 times a day (5am/noon/8pm) and is now spreading out her carbidopa/levodopa 25/100 to one tablet 6 times per day (5am is the start and she takes that q 3 hrs until 8 pm).  She remains on entacapone 200 mg 3 times a day and primidone 75 mg daily.  She goes to  bed at about 8pm.  She has found that she is having more dyskinesia.  She has been working with rehabilitation therapist and I have gotten a couple of correspondence is from them that they have been concerned about labored breathing.   I advised follow-up with her primary care physician if this was true shortness of breath.  She did say that she wore a holter monitor for a month and that eval was negative.  Was told if continues needs pulm.  Only SOB when she is hot or exerting herself.  He also mentioned that they were concerned about inability to track inferiorly.  She did see [name] for her neuropsych testing, done on 12/22/2015.  She got  feedback from them on 01/05/2016.  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  I specifically spoke with [name], and she did not feel there was evidence of an atypical state from her testing.  She felt that she could potentially be a good DBS candidate.  She did recommend a vision evaluation, medication to help with anxiety/sleep/irritability, and a follow-up regarding a history of sleep apnea.  Pt states that she was already to the eye doctor in December at [facility].    02/02/16 update:  The patient is following up today, earlier than expected.  She is accompanied by her sister who supplements the history.  Last visit I changed her daytime extended release levodopa all over 2 immediate release.  Therefore, she was taking carbidopa/levodopa 25/100 as follows: 2 at 5am (she is waking up to take that pill and goes to bed until 7am/1 at 8am/1 at 11 am//2 at 2 pm/1 at 5pm and then would take an additional levodopa/levodopa 50/200 at 8pm.  This dropped her overall load of levodopa from 1200 mg to 900 mg, primarily because she was complaining about dyskinesia.  She called me on May 22 and stated that she was doing well initially, but now feels more weak and stiff.  When I asked her specifically, she admitted that she was being treated for urinary tract infection.  I told her to give that some time to resolve, because that can worsen the symptoms of Parkinson's.  She called the following day and requested a follow-up appointment here, which is the reason for follow-up.  She states that she is weaker, which means that the legs are "tired."  She denies freezing.  She states that overall she is more tired.  No falls.  She states that the sx's do not wax and wane throughout the day.  Her sister thinks that she is doing better but sister does state that she is "starting to get that shuffle."  Last PT was in mid April.  C/o nose dripping for years.    05/04/16 update:  The patient follows up today,  accompanied by her sister who supplements the history.  Last visit, the patient wanted to decrease her levodopa load, primarily because of dyskinesia.  We ended up reworking it significantly.  I changed her from the CR formulation to the immediate release formulation and did drop the overall load somewhat.  She is currently taking carbidopa/levodopa 25/100, 2 tablets at 7 AM/2 tablet at 10 AM/2 tablets at 1 PM/1 tablet at 4 PM/carbidopa/levodopa 50/200 at bedtime (8PM).  She cut out the 7pm dosage.    She takes entacapone 3 times per day, which turned out to be every other dose of levodopa.  She is still on primidone, but only on 50 mg  daily, which she was on prior to coming to see me.  She initally states that she isn't better but then states that dyskinesia is "a lot better than it was" which was the primary issue for changing.   She has had several falls that she attributes to looking down when she walks or getting dizzy when she first gets up.  Was able to get off of clonidine and losartan per PCP because of dizziness and that helped dizziness.  She hasn't gotten hurt with falls.   The big issue now is trouble sleeping.  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  She states that she was only using one time per day.  She also told me last visit that she was getting ready to have a sleep study for objective sleep apnea syndrome and a pulmonary consult for shortness of breath.  I do not have that information but she tells me that she does have osas and needs cpap titration and is awaiting that.  Denies depression but daughter thinks that she has it.  Pt doesn't have much to do during the day.  06/06/16 update: The patient follows up today, accompanied by her sister and daughter who supplement the history.  She is currently taking carbidopa/levodopa 25/100, 2 tablets at 7 AM/2 tablet at 10 AM/2 tablets at 1 PM/1 tablet at 4  PM/carbidopa/levodopa 50/200 at bedtime (8PM).  She is on entacapone 200 mg 3 times per day.  States that "my legs are really tired." Thinks that this is mostly in middle of the day/early AM and then "I spend the entire day trying to catch up."   Finds that if she takes an extra carbidopa/levodopa 25/100 in the middle of the night she does better.  Last visit, I discontinued her primidone, 50 mg daily.  She was complaining about vasomotor rhinorrhea last visit, and I told her to increase her atropine drops to 3 times a day as needed.  She states that she hasn't do that.  I started remeron last visit for depression and sleep.  Is still on celexa.  Remeron definitely helped sleep but got CPAP since our last visit as well.  Sister states that she is using that and is now screaming and hollaring and hitting the walls at night, but seems this was going on before CPAP.    07/21/16 update:  Pt returns again for levodopa challenge test.  2 of her daughters accompany her and supplement the history.  She is on carbidopa/levodopa 25/100, 2 tablets at 7 AM/2 tablet at 10 AM//2 tablets at 1 PM/1 tablet at 4 PM and then she will decide if she needs her last one tablet at 7 PM.  She also takes carbidopa/levodopa  50/200 at bedtime, which is 8 PM.  She is on comtan 200 mg tid.  She has been off of all PD med for about 60 hrs for this test.  She does feel more slowly and stiff, although she does feel less dizzy getting off of the medication.  10/25/16 update:  Pt returns today for pre-op visit.  She is accompanied by her daughter who supplements the history.  She is scheduled for DBS surgery next month.  She is on carbidopa/levodopa 25/100, 2/2/2/1 and sometimes will take another in middle of the night.  She takes carbidopa/levodopa 50/200 at bedtime.  She takes entacapone with 3 of the daytime dosages of levodopa.  She has had  some falls because not using her walker, often at night per daughter.  She is loving [employer].  Patient seen today in follow-up.  01/02/17 update:  Patient seen today in follow-up.  She is accompanied by her daughters who supplement the history.  She is status post bilateral STN DBS on 11/29/2016.  She had her generator placed on 12/07/2016.  She was in the hospital between the 2 surgeries with complaints of falls.  I had talked to Dr. Vertell Limber.  The patient had refused to go to subacute nursing facility for rehabilitation.  She had apparently also been refusing to use her walker.  We did get home health involved.  She has had a few falls since home health.  She fell on her butt this weekend and "it was the dogs fault."  Few other falls related to letting go of the walker.  Patient does think some of the falls were related to dizziness.  Her primary care physician has just cut back on her verapamil.  Some of issues that were being reported were due to family stress between patient and her sister.  She is now back on carbidopa/levodopa 25/100, 2/2/2/1 and entacapone with 3 of the dosages.  She is on carbidopa/levodopa 50/200 at bedtime.  She last took her PD med at [place] has not been great.  Family having difficulty understanding her.  Hasn't been doing ST with home health.  01/17/17 update:  Patient seen today in follow-up.  I activated her device on 01/02/2017.  She is currently on carbidopa/levodopa 25/100, 2 tablets at 7, 1 tablet at 10, 1 tablet at 1pm, 1 tablet at 4pm and she has d/c the carbidopa/levodopa 50/200 at night.  I discontinued her entacapone last visit.  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  She has been having some dyskinesia mostly with the L leg.  She is not stable and has had some falls in the house.  She has never had a fall with the walker but has had a fall without the walker.  Speech therapy just started last week.    02/03/17 update:  Patient seen today in follow-up.  Last visit, I decreased her medication and told her to  take carbidopa/levodopa 25/100, 1 tablets at 7am, 0.5 tablets at 11am, 3pm, 7pm.  While dyskinesia is better, she has had multiple falls.  With all of the falls, she has let go of the walker for some reason (getting out of shower, making a sandwich).  "I'm taking the walker more than I did."  She has never fallen when actually holding on to the walker.  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  05/23/17 update: patient seen today in follow-up for her Parkinson's disease.  She is accompanied by her daughter and sister who supplements the history.  Much has happened since our last visit.  I have reviewed hospital records made available to me.  She was admitted to the hospital on 04/17/2017 and discharged on 04/19/2017 with NSTEMI.  Hospital discharge records indicate that she was supposed to follow-up with Dr. Wynonia Lawman 2 weeks after discharge, but I don't see any appointment that she has attended, nor do I see an appointment made in the future.  They tell me that they have an appointment today.  She was given information for the adult daycare program.  She states that they have trouble getting her meds  ontime but the therapy helped.  It has also produced a great schedule for her, which has helped per family but patient doesn't like it.  Pt would like to return to [facility] as she feels weak. In regards to Parkinson's disease, she is on carbidopa/levodopa 25/100, one tablet at 7 AM and half a tablet at 11 AM/ on full tablet at 3 PM/ half tablet at 7 PM.  She is noting intermittent right arm tremor.  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    09/12/17 update: Patient seen today in follow-up for Parkinson's disease.  She is accompanied by her her daughter who supplements the history.  The patient is on carbidopa/levodopa 25/100, 1 tablet at 7 AM, 1 tablet at 11 AM, 1 tablet at 3 PM, half a tablet at 7 PM.  She remains on clonazepam 0.5 mg, half  tablet at night for REM behavior disorder.  I have reviewed records since her last visit.  She saw Dr. Wynonia Lawman the same day she saw me last visit.  She has had a long history of SVT.  He wanted her to see an electrophysiologist for consideration of possible ablation versus pacemaker.  She had a cardiac ablation on June 12, 2017 with Dr. Lovena Le.  Following that, it appears that she was having systolic PVCs or PACs and Toprol was prescribed.  She was given parameters.  She was told if systolic <517  hold hydralazine but take Toprol XL and lisinopril.  She was also advised to take her blood pressure before taking her evening hydralazine and lisinopril. she has had falls since our last visit.  They are happening about 1 time per week.  She feels it is due to "lightheadedness and my knees giving way."  Not related to timing of medication.  Always has walker.  Has not had PT but then states that she is doing some therapy at [facility].  Going to [facility] one day a week, but daughter states that she is giving excuses to stop going to that (such as stating that she has discharged her DBS system).  Trouble with insomnia but also just "laying around" when at home and not engaging in activities.  Complains of drooling, which has become excessive.  01/18/18 update: Patient is seen today in follow-up.  Records have been reviewed since our last visit.  She is currently on carbidopa/levodopa 25/100, 1 tablets at 7am, 1 tablets at 11am, 1 tablet at 3pm, half tablet at 7pm.  She was told to restart her clonazepam for REM behavior disorder.  She reports that she changed to melatonin.  She is using a bed rail.  Her daughter hears her yelling in sleep about 1 time per week. She was seen by cardiology in February.  She was told to wean off of the Toprol because of weakness and bradycardia.  She is feeling better in that regard.  She is having rare falls per daughter but pt estimates 1-2 times per month.  Using walker at all times.  She is  going to therapy and day program 5 days per week.  Charging device 5-6 days per week and taking 1.5 hours.   Daughter not noticing dyskinesia at all.   06/21/18 update: Patient is seen today in follow-up for Parkinson's disease, accompanied by her sister and daughter who supplements the history.  Patient attends a daycare facility during the day, and the NP there increase the patient's levodopa to 7 tablets/day from 3-1/2 tablets/day.  Obviously, this was a concern given patient's narrow window between dyskinesia and bradykinesia.  Pt reports today that she isn't taking the 7 tablets and is still taking the 3-1/2 tablets/day.  She has had several falls since last visit - "legs just give out."  Daughter states one time she bent over to pick up something and just kept going.  She reports that she generally is using a walker when this happens.  I have reviewed records from [facility] which were dated July 12 March 18, 2018.  Patient presented with difficulty with stating words and speech seemed slurred for 10 or 15 minutes.  She was evaluated by tele-neurology and was not a candidate for TPA as she was out of the therapeutic window.  In addition, her speech issues resolved very quickly.  Head CT was negative.  She could not have an MRI as the device she has was not MRI compatible at the time.  She had a CTA of the neck that demonstrated moderate 50% proximal ICA stenosis and none on the left.  Plavix was added and aspirin continued but she reports that ASA was d/c and she is just on Plavix.  Echocardiogram was complete and demonstrated left ventricular ejection fraction between 60 to 65% and mild left ventricular hypertrophy.  06/28/18 update: Patient worked in today.  Patient is accompanied by her daughter who supplements history.  Was noting more dyskinesia after adjustment last week.  Patient reports she had significant dyskinesia over the weekend, but reports that it has been much better over the last 2  days.  She had one fall on Monday.  She had just gotten up off of the toilet and had not yet grabbed the walker and fell.  10/15/18 update: Patient is seen today in follow-up for Parkinson's disease, status post deep brain stimulation implant.  She is accompanied by her daughter and her sister who supplements the history.  She had some adjustments last visit to her DBS.  We did get a call on November 7 that the patient felt like she was pulling to the right, but realized that she changed the setting on her stimulator back to the original setting and did much worse with that.  Her daughter changed it back and she was doing better.  She is currently on carbidopa/levodopa 25/100, 1 tablet / 1 tablet/1 tablet / 0.5 tablets.  Sister complains about significant fluctuations.  Some days she has a lot of trouble with speech and ambulation and other days she does much better.  Today seems to be 1 of the tough days.  She has noticed on days that she has urinary tract infections, she does much worse.  She is now on prophylaxis.  Patient does admit to hallucinations (her sister was completely unaware of this).  The patient states that she sometimes sees her mother.  She is aware that this is not real.  Daughter thinks motivation to do things is very low.  She asks me about this today.   ALLERGIES:   Allergies  Allergen Reactions  . Amoxicillin Anaphylaxis, Swelling, Rash and Other (See Comments)    Has patient had a PCN reaction causing immediate rash, facial/tongue/throat swelling, SOB or lightheadedness with hypotension: Yes PCN reaction causing SEVERE RASH INVOLVING MUCUS MEMBRANES or SKIN NECROSIS: Yes Has patient had a PCN reaction that required hospitalization: No Has patient had a PCN reaction occurring within the last 10 years: Yes   . Prednisone Shortness Of Breath, Nausea Only and Other (See Comments)  Hypertension   . Amlodipine Itching and Other (See Comments)    BURNING  . Levothyroxine Other  (See Comments)    Tired, shaky, tremors  . Meloxicam Swelling and Other (See Comments)    "Burning "  . Nebivolol Rash  . Nsaids Nausea And Vomiting  . Valsartan Itching    CURRENT MEDICATIONS:  Outpatient Encounter Medications as of 10/15/2018  Medication Sig  . atorvastatin (LIPITOR) 40 MG tablet Take 1 tablet (40 mg total) by mouth daily at 6 PM. (Patient taking differently: Take 40 mg by mouth daily at 6 PM. 1900)  . carbidopa-levodopa (SINEMET IR) 25-100 MG tablet Take by mouth. Take 1 tablet by mouth at 7 am, 1 tablet at 11 am, 1 tablet at 3 pm and 1/2 tablet at 7 pm  . clopidogrel (PLAVIX) 75 MG tablet Take 75 mg by mouth daily.  . Fluticasone Furoate (ARNUITY ELLIPTA) 100 MCG/ACT AEPB Inhale 1 puff into the lungs daily.   . hydrALAZINE (APRESOLINE) 50 MG tablet Take 50 mg by mouth 2 (two) times daily.   . hydrochlorothiazide (HYDRODIURIL) 25 MG tablet Take 25 mg by mouth daily.  Marland Kitchen lisinopril (PRINIVIL,ZESTRIL) 20 MG tablet Take 1 tablet (20 mg total) by mouth 2 (two) times daily. (Patient taking differently: Take 40 mg by mouth daily. )  . Melatonin 5 MG TABS Take 5 mg by mouth at bedtime.  . methenamine (HIPREX) 1 g tablet Take 1 g by mouth daily.   No facility-administered encounter medications on file as of 10/15/2018.     PAST MEDICAL HISTORY:   Past Medical History:  Diagnosis Date  . Anemia   . Anxiety    takes Klonopin as bedtime  . Asthma   . CHF (congestive heart failure) ([place])   . Depression    takes Citalopram daily  . Gout   . History of blood transfusion    "w/hysterectomy" (04/17/2017)  . Hypertension    takes Verapamil daily  . Hypothyroidism   . NSTEMI (non-ST elevated myocardial infarction) ([place]) 04/17/2017  . OSA on CPAP     "sitting on 5" (04/17/2017)  . Overactive bladder   . Parkinson's disease ([place])    takes Sinemet daily  . Peripheral edema    take Lasix as needed  . Pneumonia    "when I was a kid" (04/17/2017)  . Seasonal allergies   .  Skin cancer    left elbow" (04/17/2017)  . Stroke [facility])    "weaker on my right side since; he last one I know of was in 2014" (04/17/2017)  . Tachycardia   . Urinary incontinence     PAST SURGICAL HISTORY:   Past Surgical History:  Procedure Laterality Date  . ABDOMINAL HYSTERECTOMY    . CESAREAN SECTION  1976  . LEFT HEART CATH AND CORONARY ANGIOGRAPHY N/A 04/18/2017   Procedure: LEFT HEART CATH AND CORONARY ANGIOGRAPHY;  Surgeon: Jettie Booze, MD;  Location: [place] [facility];  Service: Cardiovascular;  Laterality: N/A;  . MINOR PLACEMENT OF FIDUCIAL N/A 11/22/2016   Procedure: Fiducial placement;  Surgeon: Erline Levine, MD;  Location: [facility] [place];  Service: Neurosurgery;  Laterality: N/A;  Fiducial placement  . PULSE GENERATOR IMPLANT Right 12/06/2016   Procedure: Unilateral PULSE GENERATOR IMPLANT;  Surgeon: Erline Levine, MD;  Location: [place];  Service: Neurosurgery;  Laterality: Right;  Unilateral PULSE GENERATOR IMPLANT  . RADIOLOGY WITH ANESTHESIA N/A 09/13/2016   Procedure: MRI BRAIN WITH AND WITHOUT;  Surgeon: Medication Radiologist, MD;  Location:  [place] [facility];  Service: Radiology;  Laterality: N/A;  . SKIN CANCER EXCISION Left    points to elbow  . SUBTHALAMIC STIMULATOR INSERTION Bilateral 11/29/2016   Procedure: Bilateral Deep brain stimulator placement;  Surgeon: Erline Levine, MD;  Location: [place];  Service: Neurosurgery;  Laterality: Bilateral;  Bilateral Deep brain stimulator placement  . SVT ABLATION N/A 06/12/2017   Procedure: SVT Ablation;  Surgeon: Evans Lance, MD;  Location: [place] [facility];  Service: Cardiovascular;  Laterality: N/A;  . THYROIDECTOMY, PARTIAL    . TONSILLECTOMY    . TUBAL LIGATION    . UTERINE FIBROID SURGERY      SOCIAL HISTORY:   Social History   Socioeconomic History  . Marital status: Widowed    Spouse name: Not on file  . Number of children: Not on file  . Years of education: Not on file  . Highest education level: Not on file    Occupational History  . Occupation: retired  Scientific laboratory technician  . Financial resource strain: Not on file  . Food insecurity:    Worry: Not on file    Inability: Not on file  . Transportation needs:    Medical: Not on file    Non-medical: Not on file  Tobacco Use  . Smoking status: Former Smoker    Last attempt to quit: 09/27/2012    Years since quitting: 6.0  . Smokeless tobacco: Never Used  Substance and Sexual Activity  . Alcohol use: Yes    Alcohol/week: 0.0 standard drinks    Comment: 04/17/2017 "nothing since the 1980s"  . Drug use: No  . Sexual activity: Never  Lifestyle  . Physical activity:    Days per week: Not on file    Minutes per session: Not on file  . Stress: Not on file  Relationships  . Social connections:    Talks on phone: Not on file    Gets together: Not on file    Attends religious service: Not on file    Active member of club or organization: Not on file    Attends meetings of clubs or organizations: Not on file    Relationship status: Not on file  . Intimate partner violence:    Fear of current or ex partner: Not on file    Emotionally abused: Not on file    Physically abused: Not on file    Forced sexual activity: Not on file  Other Topics Concern  . Not on file  Social History Narrative  . Not on file    FAMILY HISTORY:   Family Status  Relation Name Status  . Mother  Deceased       heart disease, DM, complications of fall  . Father  Deceased       MI  . Sister  Deceased       DM, CAD, renal failure  . Sister  Alive       healthy  . Child  Alive       healthy    ROS:  Review of Systems  Constitutional: Positive for malaise/fatigue.  Eyes: Negative.   Cardiovascular: Negative.   Musculoskeletal: Negative.   Skin: Negative.   Neurological: Positive for weakness.  Psychiatric/Behavioral: Positive for hallucinations.     PHYSICAL EXAMINATION:    VITALS:   Vitals:   10/15/18 1435  BP: 128/62  Pulse: 64  SpO2: 96%    GEN:   The patient appears stated age and is in NAD. HEENT:  Normocephalic, atraumatic.  The mucous membranes are moist. The superficial temporal arteries are without ropiness or tenderness. CV:  RRR Lungs:  CTAB Neck/HEME:  There are no carotid bruits bilaterally.  Neurological examination:  Orientation: The patient is alert and oriented x3. Cranial nerves: There is good facial symmetry. The speech is fluent and significantly dysarthric and hypophonic. Soft palate rises symmetrically and there is no tongue deviation. Hearing is intact to conversational tone. Sensation: Sensation is intact to light touch throughout Motor: Strength is at least antigravity x4.  Movement examination: Tone: There is no rigidity.   Abnormal movements:  She has no dyskinesia today.  There is intermittent tremor of the right upper extremity, worse when I am checking the DBS. Coordination: There isdecremation with any form of RAMS, including alternating supination and pronation of the forearm, hand opening and closing, finger taps, heel taps and toe taps on the left.   Gait and Station: she pushes off of the wheelchair and requires some assistance to get out of the wheelchair.  The legs are bent at the knees.  She is given a walker.  She walks very fast with it (almost festinating).  When in the turn, she bends the knee so far that she almost sits.   Labs: On March 16, 2018,  white blood cells were 6.3, hemoglobin 12.8, hematocrit 37.2 and platelets were 170.  Sodium was 140, potassium 3.7, chloride 102, CO2 30, BUN 24, creatinine 0.7 and glucose 142.  Total cholesterol is 102.  LDL was 50.4.  Hemoglobin A1c 5.0.   DBS programming was performed today.    ASSESSMENT/PLAN:  1.  idiopathic Parkinson's disease, diagnosed in 2014 with symptoms dating back to early 2013.  -The patient is status post bilateral STN DBS with the [facility] [employer] device on 11/29/2016 with IPG placed on 12/07/2016.  Her device was activated on  01/02/2017.  -She will continue carbidopa/levodopa 25/100, 1 tablets at 7am, 1 tablets at 11am, 1 tablet at 3pm, half tablet at 7pm   -I had quite a long discussion with the patient and her family.  They asked many questions.  At this point, her DBS is adequately programmed.  The biggest issues that she is having now are things that DBS does not control.  She is having speech issues, balance issues.  She does not think speech therapy has helped.  Her balance issues are really mostly weakness due to deconditioning.  She does go to a day program every day and get some exercise, but there is very little accountability for that exercise and she needs actual one-on-one training.   2.  RBD and insomnia  -she d/c klonopin when started using bed rails.    -we will continue to hold remeron since that was primarily for sleep (it did work if need to come back to that)   3.  Vasomotor rhinitis  -Has atropine ophthalmic.  She needs to try and increase to tid.    4.  OSAS and SOB  -She has CPAP.  5.  Dizziness/Orthostatic hypotension  -somewhat improved.  Cardiology has been working with her BP.  Better after d/c toprol  6.  NSTEMI in 04/2017  -she has subsequently had a cardiac ablation for SVT.  7. Possible TIA in July, 2019  -We discussed the diagnosis as well as pathophysiology of the disease.  We discussed signs and sx's of stroke and importance of calling 911 immediately should she have any of these.  Patient education was provided.    -Talked about stroke  risk factors which are modifiable.  -Talked about importance of blood pressure control with a goal <130/80 mm Hg.   -Talked about importance of lipid control and proper diet.  Lipids should be managed intensively, with a goal LDL < 70 mg/dL.  She is on Lipitor and LDL at goal  -Patient is now greater than 3 months from stroke.  She is on Plavix monotherapy.  -I counseled the patient on measures to reduce stroke risk, including the importance of  medication compliance, risk factor control, exercise, healthy diet, and avoidance of smoking.    8.  Hallucinations  -Overall mild.  Patient refuses medication.  Family did not even know she was having this.  I told her to talk about it with her family and we can consider medications further in the future.  9.  Depression  -I think depression is part of the reason that she is not motivated to do her exercises at her day program.  We discussed this today.  She agrees that she does not like the day program, but it is the only way that the family can help to manage her in the home.  She wants no further medication.  10.  I will see her back in follow-up in July, sooner should new neurologic issues arise.  I spent 30 minutes face-to-face time with patient and her family, which did not include the DBS time.  Greater than 50% of that was in counseling.

## 2018-10-15 ENCOUNTER — Encounter: Payer: Self-pay | Admitting: Neurology

## 2018-10-15 ENCOUNTER — Ambulatory Visit (INDEPENDENT_AMBULATORY_CARE_PROVIDER_SITE_OTHER): Payer: PRIVATE HEALTH INSURANCE | Admitting: Neurology

## 2018-10-15 VITALS — BP 128/62 | HR 64

## 2018-10-15 DIAGNOSIS — R441 Visual hallucinations: Secondary | ICD-10-CM

## 2018-10-15 DIAGNOSIS — G20 Parkinson's disease: Secondary | ICD-10-CM

## 2018-10-15 DIAGNOSIS — R471 Dysarthria and anarthria: Secondary | ICD-10-CM

## 2018-10-15 DIAGNOSIS — G20A1 Parkinson's disease without dyskinesia, without mention of fluctuations: Secondary | ICD-10-CM

## 2018-10-15 DIAGNOSIS — F330 Major depressive disorder, recurrent, mild: Secondary | ICD-10-CM | POA: Diagnosis not present

## 2018-10-15 NOTE — Procedures (Signed)
DBS Programming was performed on [employer] Device  Total time spent programming was 30 minutes.  Device was found to be on and adequately charged.   Impedences were checked and were within normal limits (all under 1163).  Battery was checked and was determined to be functioning normally and not near the end of life.    Final settings were as follows:   Left brain electrode:     2-(55%) 3-(45%) C+    ; Amplitude  2.4 ; Pulse width 50 microseconds;   Frequency   130   Hz.   Right brain electrode:     11-C+          ; Amplitude   1.8  mAmps ;  Pulse width 60  microseconds;  Frequency   130    Hz.

## 2018-10-18 ENCOUNTER — Inpatient Hospital Stay (HOSPITAL_COMMUNITY)
Admission: AD | Admit: 2018-10-18 | Discharge: 2018-10-24 | DRG: 069 | Disposition: A | Discharge status: Skilled Nursing Facility | Payer: Medicare (Managed Care) | Source: Other Acute Inpatient Hospital | Attending: Neurology | Admitting: Neurology

## 2018-10-18 DIAGNOSIS — Z90710 Acquired absence of both cervix and uterus: Secondary | ICD-10-CM | POA: Diagnosis not present

## 2018-10-18 DIAGNOSIS — Z8673 Personal history of transient ischemic attack (TIA), and cerebral infarction without residual deficits: Secondary | ICD-10-CM

## 2018-10-18 DIAGNOSIS — G20A1 Parkinson's disease without dyskinesia, without mention of fluctuations: Secondary | ICD-10-CM | POA: Diagnosis present

## 2018-10-18 DIAGNOSIS — I639 Cerebral infarction, unspecified: Secondary | ICD-10-CM

## 2018-10-18 DIAGNOSIS — Z85828 Personal history of other malignant neoplasm of skin: Secondary | ICD-10-CM | POA: Diagnosis not present

## 2018-10-18 DIAGNOSIS — R471 Dysarthria and anarthria: Secondary | ICD-10-CM | POA: Diagnosis not present

## 2018-10-18 DIAGNOSIS — I4891 Unspecified atrial fibrillation: Secondary | ICD-10-CM | POA: Diagnosis not present

## 2018-10-18 DIAGNOSIS — I5032 Chronic diastolic (congestive) heart failure: Secondary | ICD-10-CM | POA: Diagnosis present

## 2018-10-18 DIAGNOSIS — I252 Old myocardial infarction: Secondary | ICD-10-CM

## 2018-10-18 DIAGNOSIS — Z79899 Other long term (current) drug therapy: Secondary | ICD-10-CM | POA: Diagnosis not present

## 2018-10-18 DIAGNOSIS — R4701 Aphasia: Secondary | ICD-10-CM | POA: Diagnosis not present

## 2018-10-18 DIAGNOSIS — Z8249 Family history of ischemic heart disease and other diseases of the circulatory system: Secondary | ICD-10-CM

## 2018-10-18 DIAGNOSIS — I110 Hypertensive heart disease with heart failure: Secondary | ICD-10-CM | POA: Diagnosis present

## 2018-10-18 DIAGNOSIS — R29810 Facial weakness: Secondary | ICD-10-CM | POA: Diagnosis present

## 2018-10-18 DIAGNOSIS — E876 Hypokalemia: Secondary | ICD-10-CM | POA: Diagnosis not present

## 2018-10-18 DIAGNOSIS — G460 Middle cerebral artery syndrome: Secondary | ICD-10-CM | POA: Diagnosis not present

## 2018-10-18 DIAGNOSIS — I2510 Atherosclerotic heart disease of native coronary artery without angina pectoris: Secondary | ICD-10-CM | POA: Diagnosis not present

## 2018-10-18 DIAGNOSIS — Z9282 Status post administration of tPA (rtPA) in a different facility within the last 24 hours prior to admission to current facility: Secondary | ICD-10-CM

## 2018-10-18 DIAGNOSIS — Z993 Dependence on wheelchair: Secondary | ICD-10-CM | POA: Diagnosis not present

## 2018-10-18 DIAGNOSIS — I6350 Cerebral infarction due to unspecified occlusion or stenosis of unspecified cerebral artery: Secondary | ICD-10-CM | POA: Diagnosis not present

## 2018-10-18 DIAGNOSIS — Z7902 Long term (current) use of antithrombotics/antiplatelets: Secondary | ICD-10-CM | POA: Diagnosis not present

## 2018-10-18 DIAGNOSIS — E785 Hyperlipidemia, unspecified: Secondary | ICD-10-CM | POA: Diagnosis not present

## 2018-10-18 DIAGNOSIS — Z87891 Personal history of nicotine dependence: Secondary | ICD-10-CM | POA: Diagnosis not present

## 2018-10-18 DIAGNOSIS — R2990 Unspecified symptoms and signs involving the nervous system: Secondary | ICD-10-CM | POA: Diagnosis present

## 2018-10-18 DIAGNOSIS — R29704 NIHSS score 4: Secondary | ICD-10-CM | POA: Diagnosis not present

## 2018-10-18 DIAGNOSIS — G20 Parkinson's disease: Secondary | ICD-10-CM | POA: Diagnosis present

## 2018-10-18 DIAGNOSIS — I119 Hypertensive heart disease without heart failure: Secondary | ICD-10-CM

## 2018-10-18 DIAGNOSIS — G4733 Obstructive sleep apnea (adult) (pediatric): Secondary | ICD-10-CM | POA: Diagnosis present

## 2018-10-18 LAB — MRSA PCR SCREENING: MRSA by PCR: NEGATIVE

## 2018-10-18 MED ORDER — PANTOPRAZOLE SODIUM 40 MG IV SOLR
40.0000 mg | Freq: Every day | INTRAVENOUS | Status: DC
  Administered 2018-10-18 – 2018-10-20 (×3): 40 mg via INTRAVENOUS
  Filled 2018-10-18 (×3): qty 40

## 2018-10-18 MED ORDER — ACETAMINOPHEN 650 MG RE SUPP: 650.0000 mg | RECTAL | Status: DC | PRN

## 2018-10-18 MED ORDER — SODIUM CHLORIDE 0.9 % IV SOLN
INTRAVENOUS | Status: DC
  Administered 2018-10-18 – 2018-10-21 (×4): via INTRAVENOUS

## 2018-10-18 MED ORDER — ACETAMINOPHEN 160 MG/5ML PO SOLN: 650.0000 mg | ORAL | Status: DC | PRN

## 2018-10-18 MED ORDER — STROKE: EARLY STAGES OF RECOVERY BOOK
Freq: Once | Status: DC
  Filled 2018-10-18: qty 1

## 2018-10-18 MED ORDER — SENNOSIDES-DOCUSATE SODIUM 8.6-50 MG PO TABS: 1.0000 | ORAL_TABLET | Freq: Every evening | ORAL | Status: DC | PRN

## 2018-10-18 MED ORDER — ACETAMINOPHEN 325 MG PO TABS
650.0000 mg | ORAL_TABLET | ORAL | Status: DC | PRN
  Administered 2018-10-24: 650 mg via ORAL
  Filled 2018-10-18: qty 2

## 2018-10-18 NOTE — Plan of Care (Signed)
  Problem: Education: Goal: Knowledge of disease or condition will improve Outcome: Progressing   

## 2018-10-18 NOTE — Progress Notes (Signed)
Pt SBP consistently over 180. MD paged x 2. Awaiting return call

## 2018-10-18 NOTE — H&P (Signed)
Neurology H&P    CC: Aphasia and weakness  History is obtained from:patient and records   HPI: Ana Salazar is a 73 y.o. female with significant hx of PD and afib. She is wheelchair bound at baseline. She was reportedly slumped over in her wheelchair, she had slurred speech and acute on chronic weakness on the right along with worsening dysarthria. She apparently takes plavix at home and had a cardiac ablation 1 year ago   Seen at [place] by [facility]  [facility]- negative for acute abnormality  CTA head and neck- reportedly negative for LVO  Given IV TPA   LKW: 1045 tpa given?: yes at 1158    ROS: A 14 point ROS was performed and is negative except as noted in the HPI.   Past Medical History:  Diagnosis Date  . Anemia   . Anxiety    takes Klonopin as bedtime  . Asthma   . CHF (congestive heart failure) ([place])   . Depression    takes Citalopram daily  . Gout   . History of blood transfusion    "w/hysterectomy" (04/17/2017)  . Hypertension    takes Verapamil daily  . Hypothyroidism   . NSTEMI (non-ST elevated myocardial infarction) ([place]) 04/17/2017  . OSA on CPAP     "sitting on 5" (04/17/2017)  . Overactive bladder   . Parkinson's disease ([place])    takes Sinemet daily  . Peripheral edema    take Lasix as needed  . Pneumonia    "when I was a kid" (04/17/2017)  . Seasonal allergies   . Skin cancer    left elbow" (04/17/2017)  . Stroke [facility])    "weaker on my right side since; he last one I know of was in 2014" (04/17/2017)  . Tachycardia   . Urinary incontinence      Family History  Problem Relation Age of Onset  . Heart disease Father      Social History:   reports that she quit smoking about 6 years ago. She has never used smokeless tobacco. She reports current alcohol use. She reports that she does not use drugs.  Medications  Current Facility-Administered Medications:  .   stroke: mapping our early stages of recovery book, , Does not apply,  Once, Aroor, Karena Addison R, MD .  0.9 %  sodium chloride infusion, , Intravenous, Continuous, Aroor, Karena Addison R, MD .  acetaminophen (TYLENOL) tablet 650 mg, 650 mg, Oral, Q4H PRN **OR** acetaminophen (TYLENOL) solution 650 mg, 650 mg, Per Tube, Q4H PRN **OR** acetaminophen (TYLENOL) suppository 650 mg, 650 mg, Rectal, Q4H PRN, Aroor, Karena Addison R, MD .  pantoprazole (PROTONIX) injection 40 mg, 40 mg, Intravenous, QHS, Aroor, Karena Addison R, MD .  senna-docusate (Senokot-S) tablet 1 tablet, 1 tablet, Oral, QHS PRN, Aroor, Lanice Schwab, MD   Exam: Current vital signs: BP (!) 191/81   Pulse 68   Temp 98.3 F (36.8 C) (Oral)   Resp (!) 9   Ht 5\' 3"  (1.6 m)   Wt 73.5 kg   SpO2 96%   BMI 28.70 kg/m  Vital signs in last 24 hours: Temp:  [98.3 F (36.8 C)] 98.3 F (36.8 C) (02/13 1721) Pulse Rate:  [68-74] 68 (02/13 1800) Resp:  [9-19] 9 (02/13 1800) BP: (181-191)/(81-114) 191/81 (02/13 1800) SpO2:  [96 %-100 %] 96 % (02/13 1800) Weight:  [73.5 kg] 73.5 kg (02/13 1721)  Physical Exam  Constitutional: Appears well-developed and well-nourished.  Psych: Affect appropriate to situation Eyes: No scleral injection HENT:  No OP obstrucion   Neuro: Mental Status: Patient is awake, alert, oriented to person, place, month, year, and situation. Patient is able to give a clear and coherent history. No signs of aphasia or neglect. Moderate dysarthria, unclear of baseline due to PD Cranial Nerves: II: Visual Fields are full. Pupils are equal, round, and reactive to light.  III,IV, VI: EOMI without ptosis or diploplia.  V: Deceased on the R VII: Mild R facial droop.  VIII: hearing is intact to voice X: Uvula elevates symmetrically XI: Shoulder shrug is symmetric. XII: tongue is midline without atrophy or fasciculations.  Motor: Tone is normal. Bulk is normal. 5/5 strength was present on the L hemibody and 4/5 on the R hemibody  Sensory: Sensation is decreased to light touch in the UE's and face on  the R but equal in the legs  Cerebellar: FNF and HKS are intact bilaterally  NIHSS 4   Labs I have reviewed labs in epic and the results pertinent to this consultation are:   CBC    Component Value Date/Time   WBC 5.9 05/30/2017 1311   WBC 6.2 04/19/2017 0430   RBC 4.18 05/30/2017 1311   RBC 3.94 04/19/2017 0430   HGB 12.6 05/30/2017 1311   HCT 39.3 05/30/2017 1311   PLT 198 05/30/2017 1311   MCV 94 05/30/2017 1311   MCH 30.1 05/30/2017 1311   MCH 30.2 04/19/2017 0430   MCHC 32.1 05/30/2017 1311   MCHC 33.3 04/19/2017 0430   RDW 15.0 05/30/2017 1311   LYMPHSABS 1.1 12/02/2016 1122   MONOABS 0.5 12/02/2016 1122   EOSABS 0.1 12/02/2016 1122   BASOSABS 0.0 12/02/2016 1122    CMP     Component Value Date/Time   NA 143 05/30/2017 1311   K 4.4 05/30/2017 1311   CL 105 05/30/2017 1311   CO2 24 05/30/2017 1311   GLUCOSE 97 05/30/2017 1311   GLUCOSE 84 04/19/2017 0430   BUN 20 05/30/2017 1311   CREATININE 0.88 05/30/2017 1311   CALCIUM 9.6 05/30/2017 1311   PROT 5.6 (L) 12/03/2016 0416   ALBUMIN 3.1 (L) 12/03/2016 0416   AST 9 (L) 12/03/2016 0416   ALT <5 (L) 12/03/2016 0416   ALKPHOS 71 12/03/2016 0416   BILITOT 0.7 12/03/2016 0416   GFRNONAA 67 05/30/2017 1311   GFRAA 78 05/30/2017 1311    Lipid Panel     Component Value Date/Time   CHOL 158 04/18/2017 0046   TRIG 95 04/18/2017 0046   HDL 38 (L) 04/18/2017 0046   CHOLHDL 4.2 04/18/2017 0046   VLDL 19 04/18/2017 0046   LDLCALC 101 (H) 04/18/2017 0046     Imaging     Assessment:  4 female with hx as above who presents s/p IV TPA for L MCA syndrome, negative CTA    Impression: Stroke vs worsening PD  Recommendations: - Admit to Neuro ICU for post IV TPA care - 24hr CTH for post tpa - NPO until passes swallow - No antiplatelet or AC in first 24hrs - SCD's for DVT ppx - MRI brain without contrast - 2D ECHO - PT/OT - Lipid, A1c - Lipitor

## 2018-10-19 ENCOUNTER — Inpatient Hospital Stay (HOSPITAL_COMMUNITY): Payer: Medicare (Managed Care)

## 2018-10-19 DIAGNOSIS — I6350 Cerebral infarction due to unspecified occlusion or stenosis of unspecified cerebral artery: Secondary | ICD-10-CM

## 2018-10-19 DIAGNOSIS — I639 Cerebral infarction, unspecified: Secondary | ICD-10-CM

## 2018-10-19 LAB — LIPID PANEL
Cholesterol: 96 mg/dL (ref 0–200)
HDL: 41 mg/dL (ref >40)
LDL Cholesterol: 47 mg/dL (ref 0–99)
Total CHOL/HDL Ratio: 2.3 RATIO
Triglycerides: 40 mg/dL (ref <150)
VLDL: 8 mg/dL (ref 0–40)

## 2018-10-19 LAB — HEMOGLOBIN A1C
Hgb A1c MFr Bld: 5.2 % (ref 4.8–5.6)
Mean Plasma Glucose: 102.54 mg/dL

## 2018-10-19 LAB — ECHOCARDIOGRAM COMPLETE
HEIGHTINCHES: 63 in
WEIGHTICAEL: 2592.61 [oz_av]

## 2018-10-19 LAB — HIV ANTIBODY (ROUTINE TESTING W REFLEX): HIV Screen 4th Generation wRfx: NONREACTIVE

## 2018-10-19 MED ORDER — HYDRALAZINE HCL 20 MG/ML IJ SOLN
10.0000 mg | INTRAMUSCULAR | Status: DC | PRN
  Administered 2018-10-19: 10 mg via INTRAVENOUS
  Filled 2018-10-19 (×2): qty 1

## 2018-10-19 MED ORDER — LABETALOL HCL 5 MG/ML IV SOLN
10.0000 mg | INTRAVENOUS | Status: DC | PRN
  Administered 2018-10-22 (×2): 10 mg via INTRAVENOUS
  Filled 2018-10-19 (×2): qty 4

## 2018-10-19 MED ORDER — ATORVASTATIN CALCIUM 40 MG PO TABS
40.0000 mg | ORAL_TABLET | Freq: Every day | ORAL | Status: DC
  Administered 2018-10-19 – 2018-10-23 (×5): 40 mg via ORAL
  Filled 2018-10-19 (×5): qty 1

## 2018-10-19 MED ORDER — CARBIDOPA-LEVODOPA 25-100 MG PO TABS
1.0000 | ORAL_TABLET | ORAL | Status: DC
  Administered 2018-10-20 – 2018-10-24 (×14): 1 via ORAL
  Filled 2018-10-19 (×14): qty 1

## 2018-10-19 MED ORDER — CARBIDOPA-LEVODOPA 25-100 MG PO TABS
0.5000 | ORAL_TABLET | ORAL | Status: DC
  Administered 2018-10-19 – 2018-10-23 (×5): 0.5 via ORAL
  Filled 2018-10-19 (×5): qty 1

## 2018-10-19 MED ORDER — LISINOPRIL 20 MG PO TABS
40.0000 mg | ORAL_TABLET | Freq: Every day | ORAL | Status: DC
  Administered 2018-10-19 – 2018-10-24 (×6): 40 mg via ORAL
  Filled 2018-10-19 (×6): qty 2

## 2018-10-19 MED ORDER — APIXABAN 5 MG PO TABS
5.0000 mg | ORAL_TABLET | Freq: Two times a day (BID) | ORAL | Status: DC
  Administered 2018-10-19 – 2018-10-24 (×11): 5 mg via ORAL
  Filled 2018-10-19 (×11): qty 1

## 2018-10-19 NOTE — Plan of Care (Signed)
Patient stable, discussed POC with patient, agreeable with plan, denies question/concerns at this time.  

## 2018-10-19 NOTE — Evaluation (Signed)
Speech Language Pathology Evaluation Patient Details Name: Ana Salazar MRN: [id number] DOB: Mar 11, 1946 Today's Date: 10/19/2018 Time: 4008-6761 SLP Time Calculation (min) (ACUTE ONLY): 18 min  Problem List:  Patient Active Problem List   Diagnosis Date Noted  . Ischemic stroke ([place]) 10/18/2018  . Paroxysmal SVT (supraventricular tachycardia) ([place]) 04/19/2017  . CAD (coronary artery disease), native coronary artery 04/18/2017  . Hypertensive heart disease without CHF 04/18/2017  . Obesity (BMI 30-39.9) 04/18/2017  . Generalized weakness 12/02/2016  . 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 Parkinson's disease ([place]) 09/28/2015   Past Medical History:  Past Medical History:  Diagnosis Date  . Anemia   . Anxiety    takes Klonopin as bedtime  . Asthma   . CHF (congestive heart failure) ([place])   . Depression    takes Citalopram daily  . Gout   . History of blood transfusion    "w/hysterectomy" (04/17/2017)  . Hypertension    takes Verapamil daily  . Hypothyroidism   . NSTEMI (non-ST elevated myocardial infarction) ([place]) 04/17/2017  . OSA on CPAP     "sitting on 5" (04/17/2017)  . Overactive bladder   . Parkinson's disease ([place])    takes Sinemet daily  . Peripheral edema    take Lasix as needed  . Pneumonia    "when I was a kid" (04/17/2017)  . Seasonal allergies   . Skin cancer    left elbow" (04/17/2017)  . Stroke [facility])    "weaker on my right side since; he last one I know of was in 2014" (04/17/2017)  . Tachycardia   . Urinary incontinence    Past Surgical History:  Past Surgical History:  Procedure Laterality Date  . ABDOMINAL HYSTERECTOMY    . CESAREAN SECTION  1976  . LEFT HEART CATH AND CORONARY ANGIOGRAPHY N/A 04/18/2017   Procedure: LEFT HEART CATH AND CORONARY ANGIOGRAPHY;  Surgeon: Jettie Booze, MD;  Location: [place] [facility];  Service: Cardiovascular;  Laterality: N/A;  . MINOR PLACEMENT OF FIDUCIAL N/A 11/22/2016   Procedure: Fiducial  placement;  Surgeon: Erline Levine, MD;  Location: [place];  Service: Neurosurgery;  Laterality: N/A;  Fiducial placement  . PULSE GENERATOR IMPLANT Right 12/06/2016   Procedure: Unilateral PULSE GENERATOR IMPLANT;  Surgeon: Erline Levine, MD;  Location: [facility];  Service: Neurosurgery;  Laterality: Right;  Unilateral PULSE GENERATOR IMPLANT  . RADIOLOGY WITH ANESTHESIA N/A 09/13/2016   Procedure: MRI BRAIN WITH AND WITHOUT;  Surgeon: Medication Radiologist, MD;  Location: [facility];  Service: Radiology;  Laterality: N/A;  . SKIN CANCER EXCISION Left    points to elbow  . SUBTHALAMIC STIMULATOR INSERTION Bilateral 11/29/2016   Procedure: Bilateral Deep brain stimulator placement;  Surgeon: Erline Levine, MD;  Location: [place];  Service: Neurosurgery;  Laterality: Bilateral;  Bilateral Deep brain stimulator placement  . SVT ABLATION N/A 06/12/2017   Procedure: SVT Ablation;  Surgeon: Evans Lance, MD;  Location: [place] [facility];  Service: Cardiovascular;  Laterality: N/A;  . THYROIDECTOMY, PARTIAL    . TONSILLECTOMY    . TUBAL LIGATION    . UTERINE FIBROID SURGERY     HPI:  Pt is a 73 y.o. female with significant hx of Parkinson's disease with deep brain stimulator, and afib. She is wheelchair bound at baseline. She was reportedly slumped over in her wheelchair with acute on chronic weakness on the right along and dysarthria. CT of the head was negative.    Assessment / Plan / Recommendation Clinical Impression  Pt participated well in speech/language evaluation. She was alert and cooperaitve throughout the evaluation and reported that she believes her cognition and language are at baseline but her speech is currently 90% back to baseline. Her language skills were within normal limits but she presented with mild dysarthria characterized by reduced articulatory precision which negatively impacted speech intelligibility at the conversational level when an increased speaking rate was utilized. Considering the  results of the head CT, further skilled SLP services are not clinically indicated at this level of care. However, she was advised to follow up with her PCP if her speech does not return to baseline and she is interested in participating in outpatient SLP services for treatment.     SLP Assessment  SLP Recommendation/Assessment: Patient does not need any further Speech Lanaguage Pathology Services SLP Visit Diagnosis: Dysarthria and anarthria (R47.1)    Follow Up Recommendations  Other (comment)(Oupatient SLP if dysarthria does not resolve)    Frequency and Duration           SLP Evaluation Cognition  Overall Cognitive Status: History of cognitive impairments - at baseline Orientation Level: Oriented X4       Comprehension  Auditory Comprehension Overall Auditory Comprehension: Appears within functional limits for tasks assessed Yes/No Questions: Within Functional Limits Commands: Within Functional Limits Conversation: Complex Reading Comprehension Reading Status: Not tested    Expression Expression Primary Mode of Expression: Verbal Verbal Expression Overall Verbal Expression: Appears within functional limits for tasks assessed Initiation: No impairment Automatic Speech: Counting;Day of week;Month of year Level of Generative/Spontaneous Verbalization: Phrase;Sentence Repetition: No impairment Naming: No impairment Pragmatics: No impairment Written Expression Dominant Hand: Right   Oral / Motor  Oral Motor/Sensory Function Overall Oral Motor/Sensory Function: Mild impairment Facial ROM: Within Functional Limits Facial Symmetry: Within Functional Limits Facial Strength: Within Functional Limits Facial Sensation: Within Functional Limits Lingual Symmetry: Within Functional Limits Lingual Strength: Reduced Lingual Sensation: Within Functional Limits Mandible: Within Functional Limits Motor Speech Overall Motor Speech: Impaired Respiration: Within functional  limits Phonation: Hoarse Resonance: Within functional limits Articulation: Within functional limitis Intelligibility: Intelligibility reduced Word: (100%) Phrase: (100%) Sentence: (100% with known context) Conversation: 75-100% accurate Motor Speech Errors: Aware(Reported 90% back to baseline)   Ana Salazar, [place], [place] Office number [phone] Pager [place] 10/19/2018, 5:31 PM

## 2018-10-19 NOTE — Progress Notes (Signed)
PT Cancellation Note  Patient Details Name: Ana Salazar MRN: [id number] DOB: 02/13/1946   Cancelled Treatment:    Reason Eval/Treat Not Completed: Active bedrest order;Medical issues which prohibited therapy(nurse states to check back this afternoon after followup CT)  Zachary George, SPT [phone]  Lynwood Kubisiak 10/19/2018, 9:18 AM

## 2018-10-19 NOTE — Evaluation (Addendum)
Physical Therapy Evaluation Patient Details Name: Ana Salazar MRN: [id number] DOB: 08-25-1946 Today's Date: 10/19/2018   History of Present Illness  Pt is a 73 yo female admitted for ischemic stroke, PMH includes PD, CHF, Afib, NSTEMI, HTN, peripheral edema and tachycardia  Clinical Impression  Pt is a pleasant 73yo female admitted for workup for ischemic stroke including administration of tpa. Negative CT. Pt presents with functional mobility deficits including decreased strength,ROM and activity tolerance. Pt would benefit from skilled physical therapy services to improve deficits to allow for safe return home with her sister and daughter. Pt reported going to a senior center M-F for 4.5 hours and receiving therapy there which should continue upon discharge from hospital.     Follow Up Recommendations Supervision/Assistance - 24 hour    Equipment Recommendations  None recommended by PT    Recommendations for Other Services       Precautions / Restrictions Precautions Precautions: Fall Restrictions Weight Bearing Restrictions: No      Mobility  Bed Mobility Overal bed mobility: Needs Assistance Bed Mobility: Rolling;Sidelying to Sit Rolling: Supervision Sidelying to sit: Supervision;HOB elevated       General bed mobility comments: pt used bed rails for rolling and states she has bed rails at home   Transfers Overall transfer level: Needs assistance Equipment used: Rolling walker (2 wheeled) Transfers: Sit to/from [facility] Sit to Stand: Min guard Stand pivot transfers: Min guard       General transfer comment: pt required vc for hand placement, pt plopped to chair after ambulation and requiring cuing for reaching back pt stated shes told that at home but she doesnt always listen  Ambulation/Gait Ambulation/Gait assistance: Min guard;+2 safety/equipment Gait Distance (Feet): 200 Feet Assistive device: Rolling walker (2 wheeled) Gait  Pattern/deviations: Step-through pattern;Decreased stride length;Trunk flexed Gait velocity: decreased   General Gait Details: pt ambulated with step through pattern with strong reliance of UE support on RW, chair follow for safety. as patient fatigued gait quality decreased slightly and pt required cuing to keep walker closer  Stairs            Wheelchair Mobility    Modified Rankin (Stroke Patients Only) Modified Rankin (Stroke Patients Only) Pre-Morbid Rankin Score: Moderately severe disability Modified Rankin: Moderately severe disability     Balance Overall balance assessment: Needs assistance Sitting-balance support: No upper extremity supported;Feet supported Sitting balance-Leahy Scale: Good     Standing balance support: Bilateral upper extremity supported Standing balance-Leahy Scale: Poor                               Pertinent Vitals/Pain Pain Assessment: No/denies pain    Home Living Family/patient expects to be discharged to:: Private residence Living Arrangements: Other relatives;Children(sister and daughter) Available Help at Discharge: Available 24 hours/day Type of Home: House Home Access: Ramped entrance     Home Layout: One level Home Equipment: [place] - 2 wheels;Bedside commode;Shower seat;Wheelchair - manual;Cane - single point Additional Comments: pt reports sister and/or daughter will be with her 24/7    Prior Function Level of Independence: Independent with assistive device(s)         Comments: pt reports using RW around home, wheelchair for longer distances, pt reports having some difficulty with walking and ADLs but her daughter and sister help her     Hand Dominance        Extremity/Trunk Assessment   Upper Extremity Assessment Upper Extremity Assessment:  Defer to OT evaluation    Lower Extremity Assessment Lower Extremity Assessment: Generalized weakness    Cervical / Trunk Assessment Cervical / Trunk  Assessment: Kyphotic  Communication   Communication: No difficulties  Cognition Arousal/Alertness: Awake/alert Behavior During Therapy: WFL for tasks assessed/performed Overall Cognitive Status: Within Functional Limits for tasks assessed                                        General Comments      Exercises     Assessment/Plan    PT Assessment Patient needs continued PT services  PT Problem List Decreased strength;Decreased activity tolerance;Decreased knowledge of use of DME;Decreased balance;Decreased safety awareness;Decreased mobility;Decreased range of motion       PT Treatment Interventions DME instruction;Therapeutic exercise;Gait training;Balance training;Neuromuscular re-education;Functional mobility training;Therapeutic activities;Patient/family education    PT Goals (Current goals can be found in the Care Plan section)  Acute Rehab PT Goals Patient Stated Goal: return home PT Goal Formulation: With patient Time For Goal Achievement: 10/26/18 Potential to Achieve Goals: Good    Frequency Min 4X/week   Barriers to discharge        Co-evaluation               AM-PAC PT "6 Clicks" Mobility  Outcome Measure Help needed turning from your back to your side while in a flat bed without using bedrails?: A Little Help needed moving from lying on your back to sitting on the side of a flat bed without using bedrails?: A Little Help needed moving to and from a bed to a chair (including a wheelchair)?: A Little Help needed standing up from a chair using your arms (e.g., wheelchair or bedside chair)?: A Little Help needed to walk in hospital room?: A Little Help needed climbing 3-5 steps with a railing? : A Lot 6 Click Score: 17    End of Session Equipment Utilized During Treatment: Gait belt Activity Tolerance: Patient tolerated treatment well Patient left: in chair;with call bell/phone within reach;with chair alarm set Nurse Communication:  Mobility status PT Visit Diagnosis: Other symptoms and signs involving the nervous system (R29.898);Muscle weakness (generalized) (M62.81);Unsteadiness on feet (R26.81)    Time: 2458-0998 PT Time Calculation (min) (ACUTE ONLY): 28 min   Charges:   PT Evaluation $PT Eval Moderate Complexity: 1 Mod PT Treatments $Gait Training: 8-22 mins        [place], [place] [phone]   Amy Steller 10/19/2018, 2:46 PM During this treatment session, the therapist was present, participating in and directing the treatment. The therapist read, reviewed and agrees with student's note.   [place] [phone] (pager)

## 2018-10-19 NOTE — Progress Notes (Addendum)
STROKE TEAM PROGRESS NOTE   INTERVAL HISTORY Her RN is at the bedside.  No family present. She feels she is back to her baseline. She is from this area. Feels the deep brain stimulator really helps her. It is on now with some mild tremor in her R arm.   Vitals:   10/19/18 0700 10/19/18 0716 10/19/18 0800 10/19/18 0900  BP: (!) 197/84 134/69 (!) 119/53 (!) 116/58  Pulse: 60 66 65   Resp: 18 (!) 21 15 16   Temp:   97.8 F (36.6 C)   TempSrc:   Oral   SpO2: 100% 98% 99% 99%  Weight:      Height:       IMAGING No results found.  PHYSICAL EXAM  Pleasant elderly Caucasian lady not in distress. . Afebrile. Head is nontraumatic. Neck is supple without bruit.    Cardiac exam no murmur or gallop. Lungs are clear to auscultation. Distal pulses are well felt. Neurological Exam :  Awake alert appears oriented to time place and person. Speech appears slightly dysarthric but can be understood clearly. No aphasia. Follows commands well. Extraocular moments are full range without nystagmus. Blinks to threat bilaterally. Mild right lower facial asymmetry when she smiles. Tongue midline. Motor system exam shows symmetric upper and lower extremity strength. No focal weakness. Mild resting tremor left upper extremity greater than right. Tremor improves with action. Sensation appears intact. Coordination appears intact. Gait not tested.  ASSESSMENT/PLAN [name] is a 73 y.o. female with history of PD w/ deep brain stimulator and AF presenting to [facility] with aphasia and weakness. Treated with IV tPA 10/18/2018 at 1158.  Stroke:  left brain infarct s/p tPA, embolic secondary to known AF not on AC  CT head ([facility])  negative  CTA head and neck ([facility])  negative  Repeat CT head at 24h  No hmg, no stroke  2D Echo  EF >65%. No source of embolus   LDL 47  HgbA1c 5.2  SCDs for VTE prophylaxis  clopidogrel 75 mg daily prior to admission, now on No antithrombotic as within 24h of  tPA administration. Add eliquis once 24h imaging neg for hmg given hx AF not on [facility]  Therapy recommendations:  pending   Disposition:  pending (lives w/ sister and goes to adult day care)  Ok to be OOB  Transfer to floor  Anticipate discharge in a.m.  Atrial Fibrillation  Home anticoagulation:  none  . Discussed starting eliquis with her . Continue Eliquis (apixaban) daily at discharge   Hypertension  Stable . BP goal normotensive once 24-hour post TPA window completed . Resume lisinopril . Hold hydralazine and hydrochlorothiazide.  Resume as appropriate  Hyperlipidemia  Home meds:  lipitor 40  LDL 47, goal < 70  Resume statin in hospital  Continue statin at discharge  Other Stroke Risk Factors  Advanced age  Former Cigarette smoker, quit 6 yrs ago  ETOH use, advised to drink no more than 1 drink(s) a day  Hx stroke  03/2018 -possible TIA  Coronary artery disease hx NSTEMI  Obstructive sleep apnea, on CPAP at home  Congestive heart failure  Other Active Problems  Parkinson's Disease w/ deep brain stimulator placed March and April 2018 followed by Dr. Joette Catching day # [place], MSN, APRN, ANVP-BC, AGPCNP-BC Advanced Practice Stroke Nurse [place] for Schedule & Pager information 10/19/2018 3:28 PM  I have personally obtained history,examined this patient, reviewed notes, independently  viewed imaging studies, participated in medical decision making and plan of care.ROS completed by me personally and pertinent positives fully documented  I have made any additions or clarifications directly to the above note. Agree with note above. She presented with sudden onset of unresponsiveness and speech difficulties due to suspected left brain infarct and was treated with IV TPA and appears to have made good improvement. Recommend strict blood pressure control and close neurological monitoring as per post TPA protocol. Continue  ongoing stroke workup. Long discussion with patient and her daughter at the bedside and answered questions about her condition.This patient is critically ill and at significant risk of neurological worsening, death and care requires constant monitoring of vital signs, hemodynamics,respiratory and cardiac monitoring, extensive review of multiple databases, frequent neurological assessment, discussion with family, other specialists and medical decision making of high complexity.I have made any additions or clarifications directly to the above note.This critical care time does not reflect procedure time, or teaching time or supervisory time of PA/NP/Med Resident etc but could involve care discussion time.  I spent 32  minutes of neurocritical care time  in the care of  this patient.      Antony Contras, MD Medical Director [facility] Stroke Center Pager: [phone] 10/19/2018 4:43 PM  To contact Stroke Continuity provider, please refer to [email or web address] After hours, contact General Neurology

## 2018-10-19 NOTE — Progress Notes (Signed)
SLP Cancellation Note  Patient Details Name: Ana Salazar MRN: [id number] DOB: [birth year]   Cancelled treatment:       Reason Eval/Treat Not Completed: Patient at procedure or test/unavailable. Initial attempt at speech-lang-cog assessment pt was eating lunch and second she was having procedure in room. Will continue efforts.   [place] Siren 10/19/2018, 2:14 PM  Orbie Pyo Colvin Caroli.Ed Risk analyst [phone] Office [phone]

## 2018-10-19 NOTE — Evaluation (Signed)
Occupational Therapy Evaluation Patient Details Name: Ana Salazar MRN: [id number] DOB: 1946-09-02 Today's Date: 10/19/2018    History of Present Illness Pt is a 73 yo female admitted for ischemic stroke, PMH includes PD, CHF, Afib, NSTEMI, HTN, peripheral edema and tachycardia   Clinical Impression   This 73 yo female admitted with above presents to acute OT with decreased balance thus affecting her safety and independence with basic ADLs. She will benefit from acute OT without need for follow up and 24 hour S with prn A anytime she is up on her feet.    Follow Up Recommendations  No OT follow up;Supervision/Assistance - 24 hour;Other (comment)(return to senior center 5 days/week for therapy there)    Equipment Recommendations  None recommended by OT       Precautions / Restrictions Precautions Precautions: Fall Restrictions Weight Bearing Restrictions: No      Mobility Bed Mobility   General bed mobility comments: Pt up in recliner just finishing with PT when I came to see her  Transfers Overall transfer level: Needs assistance Equipment used: Rolling walker (2 wheeled) Transfers: Sit to/from [facility] Sit to Stand: Min guard Stand pivot transfers: Min guard       General transfer comment: pt required vc for hand placement, pt plopped to chair after ambulation and requiring cuing for reaching back pt stated shes told that at home but she doesnt always listen    Balance Overall balance assessment: Needs assistance Sitting-balance support: No upper extremity supported;Feet supported Sitting balance-Leahy Scale: Good     Standing balance support: Bilateral upper extremity supported Standing balance-Leahy Scale: Poor                             ADL either performed or assessed with clinical judgement   ADL Overall ADL's : Needs assistance/impaired Eating/Feeding: Independent   Grooming: Supervision/safety;Set up;Sitting   Upper  Body Bathing: Set up;Supervision/ safety;Sitting   Lower Body Bathing: Min guard;Sit to/from stand   Upper Body Dressing : Set up;Supervision/safety;Sitting   Lower Body Dressing: Min guard;Sit to/from stand   Toilet Transfer: Min guard;Ambulation;RW   Toileting- Water quality scientist and Hygiene: Min guard;Sit to/from stand         General ADL Comments: Pt reports someone is always with her if she is up on her feet     Vision Patient Visual Report: No change from baseline              Pertinent Vitals/Pain Pain Assessment: No/denies pain     Hand Dominance Right   Extremity/Trunk Assessment Upper Extremity Assessment Upper Extremity Assessment: Overall WFL for tasks assessed   Lower Extremity Assessment Lower Extremity Assessment: Generalized weakness   Cervical / Trunk Assessment Cervical / Trunk Assessment: Kyphotic   Communication Communication Communication: No difficulties   Cognition Arousal/Alertness: Awake/alert Behavior During Therapy: WFL for tasks assessed/performed Overall Cognitive Status: Within Functional Limits for tasks assessed                                                Home Living Family/patient expects to be discharged to:: Private residence Living Arrangements: Other relatives;Children(sister and daughter) Available Help at Discharge: Available 24 hours/day Type of Home: House Home Access: Ramped entrance     Home Layout: One level     Bathroom Shower/Tub:  Walk-in shower   Bathroom Toilet: Handicapped height     Home Equipment: Environmental consultant - 2 wheels;Bedside commode;Shower seat;Wheelchair - manual;Cane - single point   Additional Comments: pt reports sister and/or daughter will be with her 24/7; attends a senior program 5 days a week from 10am-2:30 pm where she reports they do some therapy and other activities with her      Prior Functioning/Environment Level of Independence: Needs assistance    ADL's /  Homemaking Assistance Needed: reports her sister or dtr help her with sponge bath and dressing prn and then she has a HHAide that comes in 2 days a week to help her shower seated   Comments: pt reports using RW around home, wheelchair for longer distances        OT Problem List: Impaired balance (sitting and/or standing)      OT Treatment/Interventions: Self-care/ADL training;Balance training;DME and/or AE instruction;Patient/family education    OT Goals(Current goals can be found in the care plan section) Acute Rehab OT Goals Patient Stated Goal: return home and go back to Senior center 5 days week OT Goal Formulation: With patient Time For Goal Achievement: 11/02/18 Potential to Achieve Goals: Good  OT Frequency: Min 2X/week              AM-PAC OT "6 Clicks" Daily Activity     Outcome Measure Help from another person eating meals?: None Help from another person taking care of personal grooming?: A Little Help from another person toileting, which includes using toliet, bedpan, or urinal?: A Little Help from another person bathing (including washing, rinsing, drying)?: A Little Help from another person to put on and taking off regular upper body clothing?: A Little Help from another person to put on and taking off regular lower body clothing?: A Little 6 Click Score: 19   End of Session Equipment Utilized During Treatment: Gait belt;Rolling walker  Activity Tolerance: Patient tolerated treatment well Patient left: in chair;with call bell/phone within reach;with chair alarm set  OT Visit Diagnosis: Unsteadiness on feet (R26.81);Other abnormalities of gait and mobility (R26.89)                Time: 5809-9833 OT Time Calculation (min): 17 min Charges:  OT General Charges $OT Visit: 1 Visit OT Evaluation $OT Eval Moderate Complexity: [place], OTR/L Acute [employer] Pager [phone] Office [phone]     Almon Register 10/19/2018, 4:07 PM

## 2018-10-19 NOTE — Progress Notes (Signed)
[place] for apixaban Indication: atrial fibrillation  Labs: No results for input(s): HGB, HCT, PLT, APTT, LABPROT, INR, HEPARINUNFRC, HEPRLOWMOCWT, CREATININE, CKTOTAL, CKMB, TROPONINI in the last 72 hours.  Assessment: 76 yof presenting with ischemic stroke secondary to known afib not on anticoagulation. Patient received TPA on 2/13 at 1158 at [place] consulted to start apixaban. No SCr/CBC available yet. No bleed issues documented. Currently on SCDs.  Goal of Therapy:  Stroke prevention Monitor platelets by anticoagulation protocol: Yes   Plan:  Start apixaban 5mg  PO BID Monitor CBC, s/sx bleeding  Elicia Lamp, PharmD, BCPS Clinical Pharmacist 10/19/2018 12:41 PM

## 2018-10-19 NOTE — Progress Notes (Signed)
  Echocardiogram 2D Echocardiogram has been performed.  Ana Salazar 10/19/2018, 1:45 PM

## 2018-10-20 LAB — BASIC METABOLIC PANEL
Anion gap: 5 (ref 5–15)
BUN: 16 mg/dL (ref 8–23)
CO2: 27 mmol/L (ref 22–32)
Calcium: 9 mg/dL (ref 8.9–10.3)
Chloride: 109 mmol/L (ref 98–111)
Creatinine, Ser: 0.76 mg/dL (ref 0.44–1.00)
GFR calc Af Amer: >60 mL/min (ref >60)
GFR calc non Af Amer: >60 mL/min (ref >60)
Glucose, Bld: 92 mg/dL (ref 70–99)
Potassium: 3.3 mmol/L — ABNORMAL LOW (ref 3.5–5.1)
Sodium: 141 mmol/L (ref 135–145)

## 2018-10-20 LAB — CBC
HEMATOCRIT: 36 % (ref 36.0–46.0)
Hemoglobin: 12 g/dL (ref 12.0–15.0)
MCH: 32.2 pg (ref 26.0–34.0)
MCHC: 33.3 g/dL (ref 30.0–36.0)
MCV: 96.5 fL (ref 80.0–100.0)
Platelets: 164 10*3/uL (ref 150–400)
RBC: 3.73 MIL/uL — ABNORMAL LOW (ref 3.87–5.11)
RDW: 13.4 % (ref 11.5–15.5)
WBC: 6.7 10*3/uL (ref 4.0–10.5)
nRBC: 0 % (ref 0.0–0.2)

## 2018-10-20 MED ORDER — POTASSIUM CHLORIDE CRYS ER 20 MEQ PO TBCR
40.0000 meq | EXTENDED_RELEASE_TABLET | Freq: Once | ORAL | Status: AC
  Administered 2018-10-20: 40 meq via ORAL
  Filled 2018-10-20: qty 2

## 2018-10-20 MED ORDER — HYDRALAZINE HCL 50 MG PO TABS
50.0000 mg | ORAL_TABLET | Freq: Two times a day (BID) | ORAL | Status: DC
  Administered 2018-10-20 – 2018-10-24 (×8): 50 mg via ORAL
  Filled 2018-10-20 (×8): qty 1

## 2018-10-20 MED ORDER — POTASSIUM CHLORIDE CRYS ER 20 MEQ PO TBCR
40.0000 meq | EXTENDED_RELEASE_TABLET | Freq: Once | ORAL | Status: AC
  Administered 2018-10-21: 40 meq via ORAL
  Filled 2018-10-20: qty 2

## 2018-10-20 NOTE — Progress Notes (Signed)
STROKE TEAM PROGRESS NOTE   INTERVAL HISTORY Her RN is at the bedside.  No family present. She feels she is back to her baseline. She wants to go home. She lives with daughter but goes to senior center during the day x 5 days per week  Vitals:   10/20/18 0200 10/20/18 0400 10/20/18 0805 10/20/18 1132  BP: (!) 157/72 (!) 159/97 (!) 178/71 (!) 181/82  Pulse:   62 61  Resp:   20 20  Temp: 97.8 F (36.6 C) 98 F (36.7 C) 97.7 F (36.5 C) 97.8 F (36.6 C)  TempSrc: Axillary Axillary Oral Oral  SpO2:   98% 100%  Weight:      Height:       IMAGING Ct Head Wo Contrast  Result Date: 10/19/2018 CLINICAL DATA:  Follow-up stroke. EXAM: CT HEAD WITHOUT CONTRAST TECHNIQUE: Contiguous axial images were obtained from the base of the skull through the vertex without intravenous contrast. COMPARISON:  10/18/2018 FINDINGS: Brain: Deep brain stimulators are again noted with associated streak artifact. Within this limitation, no acute infarct, intracranial hemorrhage, mass, midline shift, or extra-axial fluid collection is identified. Confluent hypodensities in the cerebral white matter bilaterally are unchanged and nonspecific but compatible with severe chronic small vessel ischemic disease. Mild cerebral atrophy is not greater than expected for age. Vascular: Calcified atherosclerosis at the skull base. No hyperdense vessel. Skull: No fracture or suspicious osseous lesion. Sinuses/Orbits: Partially visualized right maxillary sinus mucous retention cyst. Unchanged large bilateral mastoid effusions. Unremarkable orbits. Other: None. IMPRESSION: 1. No evidence of acute intracranial abnormality. 2. Severe chronic small vessel ischemic disease. Electronically Signed   By: Logan Bores M.D.   On: 10/19/2018 13:46    PHYSICAL EXAM  Pleasant elderly Caucasian lady not in distress. . Afebrile. Head is nontraumatic. Neck is supple without bruit.    Cardiac exam no murmur or gallop. Lungs are clear to auscultation.  Distal pulses are well felt. Neurological Exam :  Awake alert appears oriented to time place and person. Speech appears slightly dysarthric but can be understood clearly. No aphasia. Follows commands well. Extraocular moments are full range without nystagmus. Blinks to threat bilaterally. Mild right lower facial asymmetry when she smiles. Tongue midline. Motor system exam shows symmetric upper and lower extremity strength. No focal weakness. Mild resting tremor left upper extremity greater than right. Tremor improves with action. Sensation appears intact. Coordination appears intact. Gait not tested.  ASSESSMENT/PLAN [name] is a 73 y.o. female with history of PD w/ deep brain stimulator and AF presenting to [facility] with aphasia and weakness. Treated with IV tPA 10/18/2018 at 1158.  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 No source of embolus   LDL 47  HgbA1c 5.2  SCDs for VTE prophylaxis  clopidogrel 75 mg daily prior to admission, now on No antithrombotic as within 24h of tPA administration. Add eliquis once 24h imaging neg for hmg given hx AF not on [facility]  Therapy recommendations:  pending   Disposition:  pending (lives w/ sister and goes to adult day care)  Ok to be OOB  Anticipate discharge today  Atrial Fibrillation  Home anticoagulation:  none  . Discussed starting eliquis with her . Continue Eliquis (apixaban) daily at discharge  Hypertension  Stable . BP goal normotensive once 24-hour post TPA window completed . Resume lisinopril . Hold hydralazine and hydrochlorothiazide.  Resume as appropriate  Hyperlipidemia  Home meds:  lipitor 40  LDL 47, goal < 70  Resume statin in hospital  Continue statin at discharge  Other Stroke Risk Factors  Advanced  age  Former Cigarette smoker, quit 6 yrs ago  ETOH use, advised to drink no more than 1 drink(s) a day  Hx stroke  03/2018 -possible TIA  Coronary artery disease hx NSTEMI  Obstructive sleep apnea, on CPAP at home  Congestive heart failure  Other Active Problems  Parkinson's Disease w/ deep brain stimulator placed March and April 2018 followed by Dr. Joette Catching day # 2      patient is neurologically stable. She'll return back to baseline. He probably had a TIA or a very small stroke not visualized on CT scan and resolved after TPA. She can be discharged home with home therapy. Will await arrival of family today discussing his plan.     Antony Contras, MD Medical Director [facility] Stroke Center Pager: [phone] 10/20/2018 12:37 PM  To contact Stroke Continuity provider, please refer to [email or web address] After hours, contact General Neurology

## 2018-10-20 NOTE — Discharge Summary (Addendum)
Patient ID: Ana Salazar   MRN: [id number]      DOB: 1945/10/04  Date of Admission: 10/18/2018 Date of Discharge: 10/24/2018  Attending Physician:  [place] Fila, MD, Stroke MD Consultant(s):    None  Patient's PCP:  [name], MD  DISCHARGE DIAGNOSIS:  Principal Problem:   Stroke-like episode ([place]) - TIA vs stroke not seem on imaging, s/p IV tPA Active Problems:   Parkinson's disease ([place])   CAD (coronary artery disease), native coronary artery   Hypertensive heart disease without CHF   Atrial fibrillation ([place])   Hyperlipidemia LDL goal <70   OSA (obstructive sleep apnea)   Past Medical History:  Diagnosis Date  . Anemia   . Anxiety    takes Klonopin as bedtime  . Asthma   . CHF (congestive heart failure) ([place])   . Depression    takes Citalopram daily  . Gout   . History of blood transfusion    "w/hysterectomy" (04/17/2017)  . Hypertension    takes Verapamil daily  . Hypothyroidism   . NSTEMI (non-ST elevated myocardial infarction) ([place]) 04/17/2017  . OSA on CPAP     "sitting on 5" (04/17/2017)  . Overactive bladder   . Parkinson's disease ([place])    takes Sinemet daily  . Peripheral edema    take Lasix as needed  . Pneumonia    "when I was a kid" (04/17/2017)  . Seasonal allergies   . Skin cancer    left elbow" (04/17/2017)  . Stroke [facility])    "weaker on my right side since; he last one I know of was in 2014" (04/17/2017)  . Tachycardia   . Urinary incontinence    Past Surgical History:  Procedure Laterality Date  . ABDOMINAL HYSTERECTOMY    . CESAREAN SECTION  1976  . LEFT HEART CATH AND CORONARY ANGIOGRAPHY N/A 04/18/2017   Procedure: LEFT HEART CATH AND CORONARY ANGIOGRAPHY;  Surgeon: Jettie Booze, MD;  Location: [place] [facility];  Service: Cardiovascular;  Laterality: N/A;  . MINOR PLACEMENT OF FIDUCIAL N/A 11/22/2016   Procedure: Fiducial placement;  Surgeon: Erline Levine, MD;  Location: [place];  Service: Neurosurgery;  Laterality: N/A;   Fiducial placement  . PULSE GENERATOR IMPLANT Right 12/06/2016   Procedure: Unilateral PULSE GENERATOR IMPLANT;  Surgeon: Erline Levine, MD;  Location: [place];  Service: Neurosurgery;  Laterality: Right;  Unilateral PULSE GENERATOR IMPLANT  . RADIOLOGY WITH ANESTHESIA N/A 09/13/2016   Procedure: MRI BRAIN WITH AND WITHOUT;  Surgeon: Medication Radiologist, MD;  Location: [place];  Service: Radiology;  Laterality: N/A;  . SKIN CANCER EXCISION Left    points to elbow  . SUBTHALAMIC STIMULATOR INSERTION Bilateral 11/29/2016   Procedure: Bilateral Deep brain stimulator placement;  Surgeon: Erline Levine, MD;  Location: [place];  Service: Neurosurgery;  Laterality: Bilateral;  Bilateral Deep brain stimulator placement  . SVT ABLATION N/A 06/12/2017   Procedure: SVT Ablation;  Surgeon: Evans Lance, MD;  Location: [place] [facility];  Service: Cardiovascular;  Laterality: N/A;  . THYROIDECTOMY, PARTIAL    . TONSILLECTOMY    . TUBAL LIGATION    . UTERINE FIBROID SURGERY      Allergies as of 10/24/2018      Reactions   Amoxicillin Anaphylaxis, Swelling, Rash, Other (See Comments)   Has patient had a PCN reaction causing immediate rash, facial/tongue/throat swelling, SOB or lightheadedness with hypotension: Yes PCN reaction causing SEVERE RASH INVOLVING MUCUS MEMBRANES or SKIN NECROSIS: Yes  Has patient had a PCN reaction that required hospitalization: No Has patient had a PCN reaction occurring within the last 10 years: Yes   Prednisone Shortness Of Breath, Nausea Only, Other (See Comments)   Hypertension    Amlodipine Itching, Other (See Comments)   BURNING   Levothyroxine Other (See Comments)   Tired, shaky, tremors   Meloxicam Swelling, Other (See Comments)   "Burning "   Nebivolol Rash   Nsaids Nausea And Vomiting   Valsartan Itching      Medication List    STOP taking these medications   clopidogrel 75 MG tablet Commonly known as:  PLAVIX   methenamine 1 g tablet Commonly known as:   HIPREX     TAKE these medications   apixaban 5 MG Tabs tablet Commonly known as:  ELIQUIS Take 1 tablet (5 mg total) by mouth 2 (two) times daily.   ARNUITY ELLIPTA 100 MCG/ACT Aepb Generic drug:  Fluticasone Furoate Inhale 1 puff into the lungs daily.   atorvastatin 40 MG tablet Commonly known as:  LIPITOR Take 1 tablet (40 mg total) by mouth daily at 6 PM. What changed:  when to take this   atropine 1 % ophthalmic solution Place 1 drop under the tongue 2 (two) times daily as needed (drooling). Place drop between gums and cheek as needed.   carbidopa-levodopa 25-100 MG tablet Commonly known as:  SINEMET IR Take 0.5-1 tablets by mouth See admin instructions. Take 1 tablet by mouth at 7 am, 1 tablet at 11 am, 1 tablet at 3 pm and 1/2 tablet at 7 pm   colchicine 0.6 MG tablet Take 0.5 tablets (0.3 mg total) by mouth daily. Start taking on:  October 25, 2018   hydrALAZINE 50 MG tablet Commonly known as:  APRESOLINE Take 50 mg by mouth 2 (two) times daily.   hydrochlorothiazide 25 MG tablet Commonly known as:  HYDRODIURIL Take 25 mg by mouth daily.   lisinopril 40 MG tablet Commonly known as:  PRINIVIL,ZESTRIL Take 1 tablet (40 mg total) by mouth daily. Start taking on:  October 25, 2018 What changed:    medication strength  how much to take  when to take this   Melatonin 5 MG Tabs Take 5 mg by mouth at bedtime.   SYSTANE 0.4-0.3 % Soln Generic drug:  Polyethyl Glycol-Propyl Glycol Place 1 drop into both eyes 2 (two) times daily as needed (dry eyes).       HOME MEDICATIONS PRIOR TO ADMISSION Medications Prior to Admission  Medication Sig Dispense Refill  . atorvastatin (LIPITOR) 40 MG tablet Take 1 tablet (40 mg total) by mouth daily at 6 PM. (Patient taking differently: Take 40 mg by mouth daily. ) 30 tablet 0  . atropine 1 % ophthalmic solution Place 1 drop under the tongue 2 (two) times daily as needed (drooling). Place drop between gums and cheek as  needed.    . carbidopa-levodopa (SINEMET IR) 25-100 MG tablet Take 0.5-1 tablets by mouth See admin instructions. Take 1 tablet by mouth at 7 am, 1 tablet at 11 am, 1 tablet at 3 pm and 1/2 tablet at 7 pm    . clopidogrel (PLAVIX) 75 MG tablet Take 75 mg by mouth daily.    . Fluticasone Furoate (ARNUITY ELLIPTA) 100 MCG/ACT AEPB Inhale 1 puff into the lungs daily.     . hydrALAZINE (APRESOLINE) 50 MG tablet Take 50 mg by mouth 2 (two) times daily.     . hydrochlorothiazide (HYDRODIURIL) 25 MG tablet  Take 25 mg by mouth daily.    Marland Kitchen lisinopril (PRINIVIL,ZESTRIL) 20 MG tablet Take 1 tablet (20 mg total) by mouth 2 (two) times daily. (Patient taking differently: Take 40 mg by mouth daily. ) 30 tablet 0  . Melatonin 5 MG TABS Take 5 mg by mouth at bedtime.    . methenamine (HIPREX) 1 g tablet Take 1 g by mouth daily.    Vladimir Faster Glycol-Propyl Glycol (SYSTANE) 0.4-0.3 % SOLN Place 1 drop into both eyes 2 (two) times daily as needed (dry eyes).       HOSPITAL MEDICATIONS .  stroke: mapping our early stages of recovery book   Does not apply Once  . apixaban  5 mg Oral BID  . atorvastatin  40 mg Oral q1800  . carbidopa-levodopa  0.5 tablet Oral Q24H  . carbidopa-levodopa  1 tablet Oral 3 times per day  . [START ON 10/25/2018] colchicine  0.3 mg Oral Daily  . colchicine  1.2 mg Oral Once   Followed by  . colchicine  0.6 mg Oral Once  . hydrALAZINE  50 mg Oral BID  . hydrochlorothiazide  25 mg Oral Daily  . lisinopril  40 mg Oral Daily  . pantoprazole  40 mg Oral Daily    LABORATORY STUDIES CBC    Component Value Date/Time   WBC 6.7 10/20/2018 0547   RBC 3.73 (L) 10/20/2018 0547   HGB 12.0 10/20/2018 0547   HGB 12.6 05/30/2017 1311   HCT 36.0 10/20/2018 0547   HCT 39.3 05/30/2017 1311   PLT 164 10/20/2018 0547   PLT 198 05/30/2017 1311   MCV 96.5 10/20/2018 0547   MCV 94 05/30/2017 1311   MCH 32.2 10/20/2018 0547   MCHC 33.3 10/20/2018 0547   RDW 13.4 10/20/2018 0547   RDW 15.0  05/30/2017 1311   LYMPHSABS 1.1 12/02/2016 1122   MONOABS 0.5 12/02/2016 1122   EOSABS 0.1 12/02/2016 1122   BASOSABS 0.0 12/02/2016 1122   CMP    Component Value Date/Time   NA 142 10/22/2018 1024   NA 143 05/30/2017 1311   K 3.9 10/22/2018 1024   CL 109 10/22/2018 1024   CO2 26 10/22/2018 1024   GLUCOSE 118 (H) 10/22/2018 1024   BUN 23 10/22/2018 1024   BUN 20 05/30/2017 1311   CREATININE 0.82 10/22/2018 1024   CALCIUM 9.4 10/22/2018 1024   PROT 5.6 (L) 12/03/2016 0416   ALBUMIN 3.1 (L) 12/03/2016 0416   AST 9 (L) 12/03/2016 0416   ALT <5 (L) 12/03/2016 0416   ALKPHOS 71 12/03/2016 0416   BILITOT 0.7 12/03/2016 0416   GFRNONAA >60 10/22/2018 1024   GFRAA >60 10/22/2018 1024   Lipid Panel    Component Value Date/Time   CHOL 96 10/19/2018 0334   TRIG 40 10/19/2018 0334   HDL 41 10/19/2018 0334   CHOLHDL 2.3 10/19/2018 0334   VLDL 8 10/19/2018 0334   LDLCALC 47 10/19/2018 0334   HgbA1C  Lab Results  Component Value Date   HGBA1C 5.2 10/19/2018   SIGNIFICANT DIAGNOSTIC STUDIES Ct Head Wo Contrast 10/19/2018 1. No evidence of acute intracranial abnormality.  2. Severe chronic small vessel ischemic disease.    HISTORY OF PRESENT ILLNESS Ana Salazar is a 73 y.o. female with significant hx of PD and afib. She is wheelchair bound at baseline. She was reportedly slumped over in her wheelchair, she had slurred speech and acute on chronic weakness on the right along with worsening dysarthria. She apparently takes plavix  at home and had a cardiac ablation 1 year ago. Seen at [facility] by [facility]. CTH- negative for acute abnormality. CTA head and neck- reportedly negative for LVO. She was LKW at 1045 on 10/18/2018. Given IV TPA there at 1158. Transferred to [facility] for further evaluation and treatment.     HOSPITAL COURSE [name] is a 73 y.o. female with history of PD w/ deep brain stimulator and AF presenting to [facility] with aphasia and  weakness. Treated with IV tPA 10/18/2018 at 1158.  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 No source of embolus   LDL 47  HgbA1c 5.2  clopidogrel 75 mg daily prior to admission, changed to Eliquis post tPA given hx AF not on [facility]  Therapy recommendations:  SNF  Disposition: lives w/ sister and goes to adult day care  Atrial Fibrillation  Home anticoagulation:  none   Started eliquis   Continue Eliquis (apixaban) daily at discharge   Hypertension  Stable  On lisinopril 40, hydralazine 50 twice daily  Hydrochlorothiazide remains on hold  Hyperlipidemia  Home meds:  lipitor 40, resumed in hospital  LDL 47, goal < 70  Continue statin at discharge  Other Stroke Risk Factors  Advanced age  Former Cigarette smoker, quit 6 yrs ago  ETOH use, advised to drink no more than 1 drink(s) a day  UDS / ETOH level not performed   Hx stroke ? 03/2018 -possible TIA  Coronary artery disease hx NSTEMI  Obstructive sleep apnea, on CPAP at home  Congestive heart failure  Other Active Problems  Parkinson's Disease w/ deep brain stimulator placed March and April 2018 followed by Dr. Carles Collet  Gout - B great toes - pain with standing - treat with colchicine 1.2 mg today followed by 0.6 in 1 hour, then 0.3 daily as she is on a statin.    DISCHARGE EXAM Blood pressure 115/62, pulse 66, temperature 98.2 F (36.8 C), temperature source Oral, resp. rate (!) 22, height 5\' 3"  (1.6 m), weight 73.5 kg, SpO2 98 %. Pleasant elderly Caucasian lady not in distress. . Afebrile. Head is nontraumatic. Neck is supple without bruit.    Cardiac exam no murmur or gallop. Lungs are clear to auscultation. Distal pulses are well felt. Neurological Exam :  Awake alert appears oriented to time place and person.  Speech appears slightly dysarthric but can be understood clearly. No aphasia. Follows commands well. Extraocular moments are full range without nystagmus. Blinks to threat bilaterally. Mild right lower facial asymmetry when she smiles. Tongue midline. Motor system exam shows symmetric upper and lower extremity strength. No focal weakness. Mild resting tremor left upper extremity greater than right. Tremor improves with action. Sensation appears intact. Coordination appears intact. Gait not tested.  Discharge Diet   dysphagia 3 thin liquids  DISCHARGE PLAN  Disposition: Discharge to home with sister  Eliquis (apixaban) daily for secondary stroke prevention.  Ongoing risk factor control by Primary Care Physician at time of discharge  Follow-up [name], MD  Or MD at SNF in 2 weeks. Check serum potassium level. Evaluate BP.  Follow-up in [facility] in 4 weeks, office to schedule an appointment.   40 minutes were spent preparing discharge.  Ivin Booty  Miachel Roux, MSN, APRN, ANVP-BC, AGPCNP-BC Advanced Practice Stroke Nurse [place] for Schedule & Pager information 10/24/2018 3:26 PM  I have personally obtained history,examined this patient, reviewed notes, independently viewed imaging studies, participated in medical decision making and plan of care.ROS completed by me personally and pertinent positives fully documented  I have made any additions or clarifications directly to the above note. Agree with note above.  Antony Contras, MD Medical Director [facility] Pager: [phone] 10/24/2018 3:26 PM

## 2018-10-21 MED ORDER — PANTOPRAZOLE SODIUM 40 MG PO TBEC
40.0000 mg | DELAYED_RELEASE_TABLET | Freq: Every day | ORAL | Status: DC
  Administered 2018-10-21 – 2018-10-24 (×4): 40 mg via ORAL
  Filled 2018-10-21 (×4): qty 1

## 2018-10-21 NOTE — Discharge Instructions (Signed)
1. 24 hour per day supervision / assistance recommended. 2. Continue senior day care 5 days per week. 3. Have potassium level check next office visit with Dr. Marcello Moores  Potassium Content of Foods  Potassium is a mineral found in many foods and drinks. It affects how the heart works, and helps keep fluids and minerals balanced in the body. The amount of potassium you need each day depends on your age and any medical conditions you may have. Talk to your health care provider or dietitian about how much potassium you need. The following lists of foods provide the general serving size for foods and the approximate amount of potassium in each serving, listed in milligrams (mg). Actual values may vary depending on the product and how it is processed. High in potassium The following foods and beverages have 200 mg or more of potassium per serving:  Apricots (raw) - 2 have 200 mg of potassium.  Apricots (dry) - 5 have 200 mg of potassium.  Artichoke - 1 medium has 345 mg of potassium.  Avocado -  fruit has 245 mg of potassium.  Banana - 1 medium fruit has 425 mg of potassium.  [place] or baked beans (canned) -  cup has 280 mg of potassium.  White beans (canned) -  cup has 595 mg potassium.  Beef roast - 3 oz has 320 mg of potassium.  Ground beef - 3 oz has 270 mg of potassium.  Beets (raw or cooked) -  cup has 260 mg of potassium.  Bran muffin - 2 oz has 300 mg of potassium.  Broccoli (cooked) -  cup has 230 mg of potassium.  Brussels sprouts -  cup has 250 mg of potassium.  Cantaloupe -  cup has 215 mg of potassium.  Cereal, 100% bran -  cup has 200-400 mg of potassium.  Cheeseburger -1 single fast food burger has 225-400 mg of potassium.  Chicken - 3 oz has 220 mg of potassium.  Clams (canned) - 3 oz has 535 mg of potassium.  Crab - 3 oz has 225 mg of potassium.  Dates - 5 have 270 mg of potassium.  Dried beans and peas -  cup has 300-475 mg of potassium.  Figs  (dried) - 2 have 260 mg of potassium.  Fish (halibut, tuna, cod, snapper) - 3 oz has 480 mg of potassium.  Fish (salmon, haddock, swordfish, perch) - 3 oz has 300 mg of potassium.  Fish (tuna, canned) - 3 oz has 200 mg of potassium.  [place] fries (fast food) - 3 oz has 470 mg of potassium.  Granola with fruit and nuts -  cup has 200 mg of potassium.  Grapefruit juice -  cup has 200 mg of potassium.  Honeydew melon -  cup has 200 mg of potassium.  Kale (raw) - 1 cup has 300 mg of potassium.  Kiwi - 1 medium fruit has 240 mg of potassium.  Kohlrabi, rutabaga, parsnips -  cup has 280 mg of potassium.  Lentils -  cup has 365 mg of potassium.  Mango - 1 each has 325 mg of potassium.  Milk (nonfat, low-fat, whole, buttermilk) - 1 cup has 350-380 mg of potassium.  Milk (chocolate) - 1 cup has 420 mg of potassium  Molasses - 1 Tbsp has 295 mg of potassium.  Mushrooms -  cup has 280 mg of potassium.  Nectarine - 1 each has 275 mg of potassium.  Nuts (almonds, peanuts, hazelnuts, [place], cashew, mixed) - 1 oz  has 200 mg of potassium.  Nuts (pistachios) - 1 oz has 295 mg of potassium.  Orange - 1 fruit has 240 mg of potassium.  Orange juice -  cup has 235 mg of potassium.  Papaya -  medium fruit has 390 mg of potassium.  Peanut butter (chunky) - 2 Tbsp has 240 mg of potassium.  Peanut butter (smooth) - 2 Tbsp has 210 mg of potassium.  Pear - 1 medium (200 mg) of potassium.  Pomegranate - 1 whole fruit has 400 mg of potassium.  Pomegranate juice -  cup has 215 mg of potassium.  Pork - 3 oz has 350 mg of potassium.  Potato chips (salted) - 1 oz has 465 mg of potassium.  Potato (baked with skin) - 1 medium has 925 mg of potassium.  Potato (boiled) -  cup has 255 mg of potassium.  Potato (Mashed) -  cup has 330 mg of potassium.  Prune juice -  cup has 370 mg of potassium.  Prunes - 5 have 305 mg of potassium.  Pudding (chocolate) -  cup has 230 mg  of potassium.  Pumpkin (canned) -  cup has 250 mg of potassium.  Raisins (seedless) -  cup has 270 mg of potassium.  Seeds (sunflower or pumpkin) - 1 oz has 240 mg of potassium.  Soy milk - 1 cup has 300 mg of potassium.  Spinach (cooked) - 1/2 cup has 420 mg of potassium.  Spinach (canned) -  cup has 370 mg of potassium.  Sweet potato (baked with skin) - 1 medium has 450 mg of potassium.  Swiss chard -  cup has 480 mg of potassium.  Tomato or vegetable juice -  cup has 275 mg of potassium.  Tomato (sauce or puree) -  cup has 400-550 mg of potassium.  Tomato (raw) - 1 medium has 290 mg of potassium.  Tomato (canned) -  cup has 200-300 mg of potassium.  [place] - 3 oz has 250 mg of potassium.  Wheat germ - 1 oz has 250 mg of potassium.  Winter squash -  cup has 250 mg of potassium.  Yogurt (plain or fruited) - 6 oz has 260-435 mg of potassium.  Zucchini -  cup has 220 mg of potassium. Moderate in potassium The following foods and beverages have 50-200 mg of potassium per serving:  Apple - 1 fruit has 150 mg of potassium  Apple juice -  cup has 150 mg of potassium  Applesauce -  cup has 90 mg of potassium  Apricot nectar -  cup has 140 mg of potassium  Asparagus (small spears) -  cup has 155 mg of potassium  Asparagus (large spears) - 6 have 155 mg of potassium  Bagel (cinnamon raisin) - 1 four-inch bagel has 130 mg of potassium  Bagel (egg or plain) - 1 four- inch bagel has 70 mg of potassium  Beans (green) -  cup has 90 mg of potassium  Beans (yellow) -  cup has 190 mg of potassium  Beer, regular - 12 oz has 100 mg of potassium  Beets (canned) -  cup has 125 mg of potassium  Blackberries -  cup has 115 mg of potassium  Blueberries -  cup has 60 mg of potassium  Bread (whole wheat) - 1 slice has 70 mg of potassium  Broccoli (raw) -  cup has 145 mg of potassium  Cabbage -  cup has 150 mg of potassium  Carrots (cooked or raw) -  cup has 180 mg of potassium  Cauliflower (raw) -  cup has 150 mg of potassium  Celery (raw) -  cup has 155 mg of potassium  Cereal, bran flakes -  cup has 120-150 mg of potassium  Cheese (cottage) -  cup has 110 mg of potassium  Cherries - 10 have 150 mg of potassium  Chocolate - 1 oz bar has 165 mg of potassium  Coffee (brewed) - 6 oz has 90 mg of potassium  Corn -  cup or 1 ear has 195 mg of potassium  Cucumbers -  cup has 80 mg of potassium  Egg - 1 large egg has 60 mg of potassium  Eggplant -  cup has 60 mg of potassium  Endive (raw) -  cup has 80 mg of potassium  English muffin - 1 has 65 mg of potassium  Fish (ocean perch) - 3 oz has 192 mg of potassium  Frankfurter, beef or pork - 1 has 75 mg of potassium  Fruit cocktail -  cup has 115 mg of potassium  Grape juice -  cup has 170 mg of potassium  Grapefruit -  fruit has 175 mg of potassium  Grapes -  cup has 155 mg of potassium  Greens: kale, turnip, collard -  cup has 110-150 mg of potassium  Ice cream or frozen yogurt (chocolate) -  cup has 175 mg of potassium  Ice cream or frozen yogurt (vanilla) -  cup has 120-150 mg of potassium  Lemons, limes - 1 each has 80 mg of potassium  Lettuce - 1 cup has 100 mg of potassium  Mixed vegetables -  cup has 150 mg of potassium  Mushrooms, raw -  cup has 110 mg of potassium  Nuts (walnuts, pecans, or macadamia) - 1 oz has 125 mg of potassium  Oatmeal -  cup has 80 mg of potassium  Okra -  cup has 110 mg of potassium  Onions -  cup has 120 mg of potassium  Peach - 1 has 185 mg of potassium  Peaches (canned) -  cup has 120 mg of potassium  Pears (canned) -  cup has 120 mg of potassium  Peas, green (frozen) -  cup has 90 mg of potassium  Peppers (Green) -  cup has 130 mg of potassium  Peppers (Red) -  cup has 160 mg of potassium  Pineapple juice -  cup has 165 mg of potassium  Pineapple (fresh or canned) -  cup has 100  mg of potassium  Plums - 1 has 105 mg of potassium  Pudding, vanilla -  cup has 150 mg of potassium  Raspberries -  cup has 90 mg of potassium  Rhubarb -  cup has 115 mg of potassium  Rice, wild -  cup has 80 mg of potassium  Shrimp - 3 oz has 155 mg of potassium  Spinach (raw) - 1 cup has 170 mg of potassium  Strawberries -  cup has 125 mg of potassium  Summer squash -  cup has 175-200 mg of potassium  Swiss chard (raw) - 1 cup has 135 mg of potassium  Tangerines - 1 fruit has 140 mg of potassium  Tea, brewed - 6 oz has 65 mg of potassium  Turnips -  cup has 140 mg of potassium  Watermelon -  cup has 85 mg of potassium  Wine (Red, table) - 5 oz has 180 mg of potassium  Wine (White, table) - 5 oz 100 mg  of potassium Low in potassium The following foods and beverages have less than 50 mg of potassium per serving.  Bread (white) - 1 slice has 30 mg of potassium  Carbonated beverages - 12 oz has less than 5 mg of potassium  Cheese - 1 oz has 20-30 mg of potassium  Cranberries -  cup has 45 mg of potassium  Cranberry juice cocktail -  cup has 20 mg of potassium  Fats and oils - 1 Tbsp has less than 5 mg of potassium  Hummus - 1 Tbsp has 32 mg of potassium  Nectar (papaya, mango, or pear) -  cup has 35 mg of potassium  Rice (white or brown) -  cup has 50 mg of potassium  Spaghetti or macaroni (cooked) -  cup has 30 mg of potassium  Tortilla, flour or corn - 1 has 50 mg of potassium  Waffle - 1 four-inch waffle has 50 mg of potassium  Water chestnuts -  cup has 40 mg of potassium Summary  Potassium is a mineral found in many foods and drinks. It affects how the heart works, and helps keep fluids and minerals balanced in the body.  The amount of potassium you need each day depends on your age and any existing medical conditions you may have. Your health care provider or dietitian may recommend an amount of potassium that you should have each  day. This information is not intended to replace advice given to you by your health care provider. Make sure you discuss any questions you have with your health care provider. Document Released: 04/05/2005 Document Revised: 11/16/2016 Document Reviewed: 11/16/2016 Elsevier Interactive Patient Education  2019 [place].   Hypokalemia Hypokalemia means that the amount of potassium in the blood is lower than normal.Potassium is a chemical that helps regulate the amount of fluid in the body (electrolyte). It also stimulates muscle tightening (contraction) and helps nerves work properly.Normally, most of the bodys potassium is inside of cells, and only a very small amount is in the blood. Because the amount in the blood is so small, minor changes to potassium levels in the blood can be life-threatening. What are the causes? This condition may be caused by:  Antibiotic medicine.  Diarrhea or vomiting. Taking too much of a medicine that helps you have a bowel movement (laxative) can cause diarrhea and lead to hypokalemia.  Chronic kidney disease (CKD).  Medicines that help the body get rid of excess fluid (diuretics).  Eating disorders, such as bulimia.  Low magnesium levels in the body.  Sweating a lot. What are the signs or symptoms? Symptoms of this condition include:  Weakness.  Constipation.  Fatigue.  Muscle cramps.  Mental confusion.  Skipped heartbeats or irregular heartbeat (palpitations).  Tingling or numbness. How is this diagnosed? This condition is diagnosed with a blood test. How is this treated? Hypokalemia can be treated by taking potassium supplements by mouth or adjusting the medicines that you take. Treatment may also include eating more foods that contain a lot of potassium. If your potassium level is very low, you may need to get potassium through an IV tube in one of your veins and be monitored in the hospital. Follow these instructions at  home:   Take over-the-counter and prescription medicines only as told by your health care provider. This includes vitamins and supplements.  Eat a healthy diet. A healthy diet includes fresh fruits and vegetables, whole grains, healthy fats, and lean proteins.  If instructed, eat more foods  that contain a lot of potassium, such as: ? Nuts, such as peanuts and pistachios. ? Seeds, such as sunflower seeds and pumpkin seeds. ? Peas, lentils, and lima beans. ? Whole grain and bran cereals and breads. ? Fresh fruits and vegetables, such as apricots, avocado, bananas, cantaloupe, kiwi, oranges, tomatoes, asparagus, and potatoes. ? Orange juice. ? Tomato juice. ? Red meats. ? Yogurt.  Keep all follow-up visits as told by your health care provider. This is important. Contact a health care provider if:  You have weakness that gets worse.  You feel your heart pounding or racing.  You vomit.  You have diarrhea.  You have diabetes (diabetes mellitus) and you have trouble keeping your blood sugar (glucose) in your target range. Get help right away if:  You have chest pain.  You have shortness of breath.  You have vomiting or diarrhea that lasts for more than 2 days.  You faint. This information is not intended to replace advice given to you by your health care provider. Make sure you discuss any questions you have with your health care provider. Document Released: 08/22/2005 Document Revised: 04/09/2016 Document Reviewed: 04/09/2016 Elsevier Interactive Patient Education  2019 [place] on my medicine - ELIQUIS (apixaban)  This medication education was reviewed with me or my healthcare representative as part of my discharge preparation.  The pharmacist that spoke with me during my hospital stay was:  Onnie Boer, RPH-CPP  Why was Eliquis prescribed for you? Eliquis was prescribed for you to reduce the risk of a blood clot forming that can cause a stroke if you  have a medical condition called atrial fibrillation (a type of irregular heartbeat).  What do You need to know about Eliquis ? Take your Eliquis TWICE DAILY - one tablet in the morning and one tablet in the evening with or without food. If you have difficulty swallowing the tablet whole please discuss with your pharmacist how to take the medication safely.  Take Eliquis exactly as prescribed by your doctor and DO NOT stop taking Eliquis without talking to the doctor who prescribed the medication.  Stopping may increase your risk of developing a stroke.  Refill your prescription before you run out.  After discharge, you should have regular check-up appointments with your healthcare provider that is prescribing your Eliquis.  In the future your dose may need to be changed if your kidney function or weight changes by a significant amount or as you get older.  What do you do if you miss a dose? If you miss a dose, take it as soon as you remember on the same day and resume taking twice daily.  Do not take more than one dose of ELIQUIS at the same time to make up a missed dose.  Important Safety Information A possible side effect of Eliquis is bleeding. You should call your healthcare provider right away if you experience any of the following: ? Bleeding from an injury or your nose that does not stop. ? Unusual colored urine (red or dark brown) or unusual colored stools (red or black). ? Unusual bruising for unknown reasons. ? A serious fall or if you hit your head (even if there is no bleeding).  Some medicines may interact with Eliquis and might increase your risk of bleeding or clotting while on Eliquis. To help avoid this, consult your healthcare provider or pharmacist prior to using any new prescription or non-prescription medications, including herbals, vitamins, non-steroidal anti-inflammatory drugs (NSAIDs) and  supplements.  This website has more information on Eliquis (apixaban):  [email or web address]/[email or web address]

## 2018-10-21 NOTE — Plan of Care (Signed)
Patient stable, discussed POC with patient and family, agreeable with plan, denies question/concerns at this time.  

## 2018-10-21 NOTE — Progress Notes (Signed)
STROKE TEAM PROGRESS NOTE   INTERVAL HISTORY Her daughter is at the bedside.  She feels she is back to her baseline.   She lives with daughter but goes to senior center during the day x 5 days per week.daughter feels patient will benefit with going for rehabilitation to short-term skilled nursing facility prior to returning home.  Vitals:   10/20/18 2351 10/21/18 0457 10/21/18 0815 10/21/18 1300  BP: (!) 164/70 140/79 (!) 179/92 109/70  Pulse: 63 62 (!) 58   Resp: 18 18 18  (!) 22  Temp: 97.9 F (36.6 C) 98.5 F (36.9 C) 98.1 F (36.7 C) 97.6 F (36.4 C)  TempSrc: Oral Oral Oral Oral  SpO2: 96% 96% 97% 97%  Weight:      Height:       IMAGING No results found.  PHYSICAL EXAM  Pleasant elderly Caucasian lady not in distress. . Afebrile. Head is nontraumatic. Neck is supple without bruit.    Cardiac exam no murmur or gallop. Lungs are clear to auscultation. Distal pulses are well felt. Neurological Exam :  Awake alert appears oriented to time place and person. Speech appears slightly dysarthric but can be understood clearly. No aphasia. Follows commands well. Extraocular moments are full range without nystagmus. Blinks to threat bilaterally. Mild right lower facial asymmetry when she smiles. Tongue midline. Motor system exam shows symmetric upper and lower extremity strength. No focal weakness. Mild resting tremor left upper extremity greater than right. Tremor improves with action. Sensation appears intact. Coordination appears intact. Gait not tested.  ASSESSMENT/PLAN [name] is a 73 y.o. female with history of PD w/ deep brain stimulator and AF presenting to [facility] with aphasia and weakness. Treated with IV tPA 10/18/2018 at 1158.  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 No source of  embolus   LDL 47  HgbA1c 5.2  SCDs for VTE prophylaxis  clopidogrel 75 mg daily prior to admission, now on No antithrombotic as within 24h of tPA administration. Add eliquis once 24h imaging neg for hmg given hx AF not on [facility]  Therapy recommendations:  pending   Disposition:  pending (lives w/ sister and goes to adult day care)  Ok to be OOB  Anticipate discharge today  Atrial Fibrillation  Home anticoagulation:  none  . Discussed starting eliquis with her . Continue Eliquis (apixaban) daily at discharge   Hypertension  Stable . BP goal normotensive once 24-hour post TPA window completed . Resume lisinopril . Hold hydralazine and hydrochlorothiazide.  Resume as appropriate  Hyperlipidemia  Home meds:  lipitor 40  LDL 47, goal < 70  Resume statin in hospital  Continue statin at discharge  Other Stroke Risk Factors  Advanced age  Former Cigarette smoker, quit 6 yrs ago  ETOH use, advised to drink no more than 1 drink(s) a day  Hx stroke  03/2018 -possible TIA  Coronary artery disease hx NSTEMI  Obstructive sleep apnea, on CPAP at home  Congestive heart failure  Other Active Problems  Parkinson's Disease w/ deep brain stimulator placed March and April 2018 followed by Dr. Joette Catching day # 3      Patient is neurologically stable.  He probably had a TIA or a very small stroke not visualized  on CT scan and resolved after TPA. She can be discharged to skilled nursing facility when bed available after insurance approval.greater than 50% time during this 25 min visit was spent on counseling and coordination of care about her TIA and discussion as stroke prevention and disposition and answering questions Long discussion with daughter at the bedside and answered questions.    Antony Contras, MD Medical Director [facility] Pager: [phone] 10/21/2018 2:15 PM  To contact Stroke Continuity provider, please refer to [email or web address] After hours,  contact General Neurology

## 2018-10-22 ENCOUNTER — Other Ambulatory Visit: Payer: Self-pay

## 2018-10-22 ENCOUNTER — Encounter (HOSPITAL_COMMUNITY): Payer: Self-pay | Admitting: Family

## 2018-10-22 LAB — BASIC METABOLIC PANEL
Anion gap: 7 (ref 5–15)
BUN: 23 mg/dL (ref 8–23)
CO2: 26 mmol/L (ref 22–32)
Calcium: 9.4 mg/dL (ref 8.9–10.3)
Chloride: 109 mmol/L (ref 98–111)
Creatinine, Ser: 0.82 mg/dL (ref 0.44–1.00)
GFR calc Af Amer: >60 mL/min (ref >60)
GFR calc non Af Amer: >60 mL/min (ref >60)
Glucose, Bld: 118 mg/dL — ABNORMAL HIGH (ref 70–99)
Potassium: 3.9 mmol/L (ref 3.5–5.1)
Sodium: 142 mmol/L (ref 135–145)

## 2018-10-22 NOTE — Progress Notes (Addendum)
Physical Therapy Treatment Patient Details Name: Ana Salazar MRN: [id number] DOB: Mar 12, 1946 Today's Date: 10/22/2018    History of Present Illness Pt is a 73 yo female admitted for ischemic stroke, PMH includes PD, CHF, Afib, NSTEMI, HTN, peripheral edema and tachycardia    PT Comments    Pt presented in bed and agreed to perform functional strengthening, and gait activities with SPTA. Pt tolerated therex in bed well with VC to perform correctly. Pt gait endurance and muscular strength was limited upon commencement of activity. Gait task was terminated as it became max assist on SPTA, resulting in chair for pt to sit after ~70 ft, and then to be transported back into pt room. Pt vital WNL during activities. Pt level of assistance indicative of need for SNF placement.Pt left with all needs within reach in recliner with feet elevated. Will inform supervising PT of need for change in recommendations.    Of note:  Spoke with [place] NP for adult day care that patient attends.  She reports at baseline ambulation is limited and patient performs only bed to chair activities.  She also reports patient with waxing and waining mobility.  Change in recommendations based on families report that they cannot manage her at home based on her current function.  [place] NP from day care reports she will reach out to family.  Will continue to assess for d/c planning.      Follow Up Recommendations  Supervision/Assistance - 24 hour;SNF     Equipment Recommendations  None recommended by PT    Recommendations for Other Services       Precautions / Restrictions Precautions Precautions: Fall Restrictions Weight Bearing Restrictions: No    Mobility  Bed Mobility Overal bed mobility: Needs Assistance Bed Mobility: Rolling;Sidelying to Sit;Sit to Sidelying   Sidelying to sit: Min guard Supine to sit: Mod assist   Sit to sidelying: Min assist General bed mobility comments: Pt required vc and tc for  hand placement, as well as movement of BLE to complete transition when supine to sit.  Transfers Overall transfer level: Needs assistance Equipment used: Rolling walker (2 wheeled) Transfers: Sit to/from [facility] Sit to Stand: Mod assist;+2 safety/equipment Stand pivot transfers: +2 safety/equipment       General transfer comment: Pt required additional assistance, vc/tc with transfers d/t fatigue and overall weakness.   Ambulation/Gait Ambulation/Gait assistance: Max assist;+2 safety/equipment(mod initially progressed to max d/t fatigue and weakness) Gait Distance (Feet): 70 Feet(+10 ft) Assistive device: Rolling walker (2 wheeled) Gait Pattern/deviations: Trunk flexed;Festinating;Decreased stride length;Shuffle Gait velocity: decreased   General Gait Details: Pt negotiated level surface with progressively worsening gait dependant on PTAx2 for support to remain erect after ~70 feet with FWW. Pt legs began to buckle and pt required a chair to sit as she gave out d/t fatigue. Pt transfered to recliner chair and rolled back to bed as she was unable to complete planned gait activity.    Stairs             Wheelchair Mobility    Modified Rankin (Stroke Patients Only)       Balance Overall balance assessment: Needs assistance Sitting-balance support: Bilateral upper extremity supported Sitting balance-Leahy Scale: Fair(pt sitting balance poor at first and progressed to fair)     Standing balance support: Bilateral upper extremity supported Standing balance-Leahy Scale: Poor  Cognition Arousal/Alertness: Awake/alert Behavior During Therapy: WFL for tasks assessed/performed Overall Cognitive Status: History of cognitive impairments - at baseline                                        Exercises General Exercises - Lower Extremity Ankle Circles/Pumps: AROM;Both;10 reps;Supine Quad Sets: Both;10  reps;AROM;Supine Heel Slides: AROM;Both;10 reps;Supine Hip ABduction/ADduction: AROM;Both;10 reps;Supine    General Comments        Pertinent Vitals/Pain Pain Assessment: Faces Faces Pain Scale: No hurt    Home Living                      Prior Function            PT Goals (current goals can now be found in the care plan section) Acute Rehab PT Goals Patient Stated Goal: to walk PT Goal Formulation: With patient Potential to Achieve Goals: Fair Progress towards PT goals: Not progressing toward goals - comment(pt appears to be declining in function resulting in increased need for assistance)    Frequency    Min 4X/week      PT Plan Discharge plan needs to be updated    Co-evaluation              AM-PAC PT "6 Clicks" Mobility   Outcome Measure  Help needed turning from your back to your side while in a flat bed without using bedrails?: A Lot Help needed moving from lying on your back to sitting on the side of a flat bed without using bedrails?: A Lot Help needed moving to and from a bed to a chair (including a wheelchair)?: A Lot Help needed standing up from a chair using your arms (e.g., wheelchair or bedside chair)?: A Lot Help needed to walk in hospital room?: A Lot Help needed climbing 3-5 steps with a railing? : A Lot 6 Click Score: 12    End of Session Equipment Utilized During Treatment: Gait belt Activity Tolerance: Patient tolerated treatment well Patient left: in chair;with call bell/phone within reach;with chair alarm set Nurse Communication: Mobility status PT Visit Diagnosis: Other symptoms and signs involving the nervous system (R29.898);Muscle weakness (generalized) (M62.81);Unsteadiness on feet (R26.81)     Time: 1400-1436 PT Time Calculation (min) (ACUTE ONLY): 36 min  Charges:  $Gait Training: 8-22 mins $Therapeutic Activity: 8-22 mins                     Maryelizabeth Kaufmann, SPTA  Tx performed by SPTA under direct guidance  and supervision of PTA at all times. This session was performed and documented under the supervision of a licensed clinician.   Charting reviewed for accuracy and depicts tx performed and services provided.   Governor Rooks, PTA Acute Rehabilitation Services Pager [phone] Office [phone]     Maryelizabeth Kaufmann 10/22/2018, 3:18 PM

## 2018-10-22 NOTE — Progress Notes (Signed)
Loss of IV access due to leaking. PIV removed. Spoke with Dr. Leonie Man. Pt has no current IV medications and anticipate discharge within the next 1-2 days. Okay to remain without IV access unless needed per Dr. Leonie Man.   Fritz Pickerel, RN

## 2018-10-22 NOTE — Progress Notes (Addendum)
ANTICOAGULATION CONSULT NOTE- follow up  Pharmacy Consult for apixaban Indication: atrial fibrillation  Labs: Recent Labs    10/20/18 0547 10/22/18 1024  HGB 12.0  --   HCT 36.0  --   PLT 164  --   CREATININE 0.76 0.82    Assessment: 80 yof presented on 10/18/18 with ischemic stroke secondary to known afib not on anticoagulation. Patient received TPA on 2/13 at 1158 at [facility] consulted on 10/19/18  to start apixaban. She was started on Apixaban 5 mg BID, appropriate dose.  Age is <90, wt is >60 kg and SCr is <1.5.  No bleed issues documented.   Goal of Therapy:  Stroke prevention Monitor platelets by anticoagulation protocol: Yes   Plan:  Apixaban 5mg  PO BID Monitor CBC, s/sx bleeding  Nicole Cella, RPh Clinical Pharmacist [phone]  Please check AMION for all [facility] phone numbers After 10:00 PM, call [facility] [phone] 10/22/2018 12:42 PM

## 2018-10-22 NOTE — Care Management Note (Addendum)
Case Management Note  Patient Details  Name: Ana Salazar MRN: [id number] Date of Birth: August 30, 1946  Subjective/Objective:  Pt admitted with a CVA and is s/p TPA. She is from home with her sister. She also [facility] a [facility].  DME: wheelchair, shower chair, walker No issues with medications.  No issues with transportation.                  Action/Plan: Per Sister at the bedside they would like to see pt have some rehab prior to returning home. Sister is the primary care giver and needs patient to be more independent prior to returning home.  PT/OT to reassess. CM following for d/c disposition.   Addendum: (1610):: CM received phone call from Ana Salazar at [facility]. They are working on getting her into one of their contracted facilities. They have started the [facility] process and Ana Salazar talk with family again in the am for confirmation of plan.   Expected Discharge Date:                  Expected Discharge Plan:  Skilled Nursing Facility  In-House Referral:  Clinical Social Work  Discharge planning Services  CM Consult  Post Acute Care Choice:    Choice offered to:     DME Arranged:    DME Agency:     HH Arranged:    [place] Agency:     Status of Service:  In process, Ana Salazar continue to follow  If discussed at Long Length of Stay Meetings, dates discussed:    Additional Comments:  Pollie Friar, RN 10/22/2018, 12:17 PM

## 2018-10-22 NOTE — Progress Notes (Signed)
Occupational Therapy Treatment Patient Details Name: Ana Salazar MRN: [id number] DOB: 03-06-1946 Today's Date: 10/22/2018    History of present illness Pt is a 73 yo female admitted for ischemic stroke, PMH includes PD, CHF, Afib, NSTEMI, HTN, peripheral edema and tachycardia   OT comments  Pt required increased lifting assistance with all sit > stand and transfers this visit.  Pt required mod assist sit > stand from EOB and min assist from elevated BSC.  Pt's sister and daughter present and reporting that she is weaker than her baseline and that they are not physically able the care that she currently requires.  Pt incontinent of bladder during standing and required assistance for hygiene and to don dry brief, per pt request.  Pt unable to thread BLE through personal incontinence brief and required assistance when pulling brief over hips.  Pt would benefit from continued OT services acutely and prior to d/c home as pt's caregivers unable to provide the necessary level of care at this time.  Will continue to follow per POC.  Follow Up Recommendations  SNF    Equipment Recommendations  None recommended by OT    Recommendations for Other Services      Precautions / Restrictions Precautions Precautions: Fall Restrictions Weight Bearing Restrictions: No       Mobility Bed Mobility Overal bed mobility: Needs Assistance Bed Mobility: Rolling;Sidelying to Sit;Sit to Sidelying   Sidelying to sit: Min guard     Sit to sidelying: Min assist    Transfers Overall transfer level: Needs assistance Equipment used: Rolling walker (2 wheeled) Transfers: Sit to/from [facility] Sit to Stand: Mod assist Stand pivot transfers: Min assist       General transfer comment: Pt required lifting assistance from EOB due to increased weakness. Pt's sister and daughter present and report they cannot provide the lifting assistance.        ADL either performed or assessed with  clinical judgement   ADL Overall ADL's : Needs assistance/impaired                         Toilet Transfer: Moderate assistance;Stand-pivot;BSC;RW Toilet Transfer Details (indicate cue type and reason): required lifting assist  Toileting- Clothing Manipulation and Hygiene: Maximal assistance;Sit to/from stand Toileting - Clothing Manipulation Details (indicate cue type and reason): Pt required assistance when threading BLE and pulling brief over hips       General ADL Comments: Pt reports someone is always with her if she is up on her feet.  Pt's sister and daughter present and both reporting that pt is requiring increased physical assistance that they are not able to provide.               Cognition Arousal/Alertness: Awake/alert Behavior During Therapy: WFL for tasks assessed/performed Overall Cognitive Status: History of cognitive impairments - at baseline                                                     Pertinent Vitals/ Pain       Pain Assessment: No/denies pain         Frequency  Min 2X/week        Progress Toward Goals  OT Goals(current goals can now be found in the care plan section)  Progress towards OT goals: Progressing toward goals  Acute Rehab OT Goals Patient Stated Goal[facility] Goal Formulation: With patient Time For Goal Achievement: 11/02/18 Potential to Achieve Goals: Good  Plan Discharge plan needs to be updated       AM-PAC OT "6 Clicks" Daily Activity     Outcome Measure   Help from another person eating meals?: None Help from another person taking care of personal grooming?: A Little Help from another person toileting, which includes using toliet, bedpan, or urinal?: A Lot Help from another person bathing (including washing, rinsing, drying)?: A Little Help from another person to put on and taking off regular upper body clothing?: A Little Help from  another person to put on and taking off regular lower body clothing?: A Lot 6 Click Score: 17    End of Session Equipment Utilized During Treatment: Gait belt;Rolling walker  OT Visit Diagnosis: Unsteadiness on feet (R26.81);Other abnormalities of gait and mobility (R26.89)   Activity Tolerance Patient tolerated treatment well   Patient Left in bed;with call bell/phone within reach;with nursing/sitter in room;with family/visitor present   Nurse Communication Mobility status(frequent urination)        Time: 6808-8110 OT Time Calculation (min): 40 min  Charges: OT General Charges $OT Visit: 1 Visit OT Treatments $Self Care/Home Management : 38-52 mins   Simonne Come, [phone] 10/22/2018, 12:13 PM

## 2018-10-22 NOTE — Progress Notes (Addendum)
STROKE TEAM PROGRESS NOTE   INTERVAL HISTORY Her daughter is at the bedside. Pt incontinent each time she stands up. Plans are for SNF - therapy to reassess and document to ensure insurance approval.    Vitals:   10/22/18 0003 10/22/18 0425 10/22/18 0552 10/22/18 0752  BP: (!) 184/76 (!) 186/78 (!) 178/80 (!) 175/79  Pulse: 72 66 62 63  Resp: 18 17 18 19   Temp: 98.4 F (36.9 C) 98.3 F (36.8 C) 98.1 F (36.7 C) 97.9 F (36.6 C)  TempSrc: Oral Oral Oral Oral  SpO2: 97% 97% 100% 99%  Weight:      Height:       IMAGING No results found.  PHYSICAL EXAM per Dr. Leonie Man [place] elderly Caucasian lady not in distress. . Afebrile. Head is nontraumatic. Neck is supple without bruit.    Cardiac exam no murmur or gallop. Lungs are clear to auscultation. Distal pulses are well felt. Neurological Exam :  Awake alert appears oriented to time place and person. Speech appears slightly dysarthric but can be understood clearly. No aphasia. Follows commands well. Extraocular moments are full range without nystagmus. Blinks to threat bilaterally. Mild right lower facial asymmetry when she smiles. Tongue midline. Motor system exam shows symmetric upper and lower extremity strength. No focal weakness. Mild resting tremor left upper extremity greater than right. Tremor improves with action. Sensation appears intact. Coordination appears intact. Gait not tested.   ASSESSMENT/PLAN Ana Salazar is a 73 y.o. female with history of PD w/ deep brain stimulator and AF presenting to [facility] with aphasia and weakness. Treated with IV tPA 10/18/2018 at 1158.  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 No source of embolus   LDL 47  HgbA1c 5.2  SCDs for VTE prophylaxis  clopidogrel 75 mg daily prior to admission, now  on Eliquis (apixaban) daily   Therapy recommendations:  SNF  Disposition:  pending (lives w/ sister and goes to adult day care)  Atrial Fibrillation  Home anticoagulation:  none  . started eliquis  . Continue Eliquis (apixaban) daily at discharge   Hypertension  Stable . On lisinopril 40, hydralazine 50 bid . Hydrochlorothiazide remains on hold  Hyperlipidemia  Home meds:  lipitor 40  LDL 47, goal < 70  Resume statin in hospital  Continue statin at discharge  Other Stroke Risk Factors  Advanced age  Former Cigarette smoker, quit 6 yrs ago  ETOH use, advised to drink no more than 1 drink(s) a day  Hx stroke  03/2018 -possible TIA  Coronary artery disease hx NSTEMI  Obstructive sleep apnea, on CPAP at home  Congestive heart failure  Other Active Problems  Parkinson's Disease w/ deep brain stimulator placed March and April 2018 followed by Dr. Joette Catching day # [place], MSN, APRN, ANVP-BC, AGPCNP-BC Advanced Practice Stroke Nurse [place] for Schedule & Pager information 10/22/2018 3:33 PM  I have personally obtained history,examined this patient, reviewed notes, independently viewed imaging studies, participated in medical decision making and plan of care.ROS completed by me personally and pertinent positives fully documented  I have made any additions or clarifications directly to the above note. Agree with note above.   Antony Contras, MD Medical Director  [facility] Pager: [phone] 10/22/2018 4:41 PM  To contact Stroke Continuity provider, please refer to [email or web address] After hours, contact General Neurology

## 2018-10-23 DIAGNOSIS — E785 Hyperlipidemia, unspecified: Secondary | ICD-10-CM | POA: Diagnosis present

## 2018-10-23 DIAGNOSIS — I4891 Unspecified atrial fibrillation: Secondary | ICD-10-CM | POA: Diagnosis present

## 2018-10-23 DIAGNOSIS — G4733 Obstructive sleep apnea (adult) (pediatric): Secondary | ICD-10-CM | POA: Diagnosis present

## 2018-10-23 MED ORDER — HYDROCHLOROTHIAZIDE 25 MG PO TABS
25.0000 mg | ORAL_TABLET | Freq: Every day | ORAL | Status: DC
  Administered 2018-10-23 – 2018-10-24 (×2): 25 mg via ORAL
  Filled 2018-10-23 (×2): qty 1

## 2018-10-23 NOTE — Care Management Important Message (Signed)
Important Message  Patient Details  Name: Ana Salazar MRN: [id number] Date of Birth: September 07, 1945   Medicare Important Message Given:  Yes    Delorse Lek 10/23/2018, 12:17 PM

## 2018-10-23 NOTE — Plan of Care (Signed)
Pt is progressing toward desired goal 

## 2018-10-23 NOTE — Progress Notes (Signed)
Physical Therapy Treatment Patient Details Name: Ana Salazar MRN: [id number] DOB: [birth year] Today's Date: 10/23/2018    History of Present Illness Pt is a 73 yo female admitted for ischemic stroke, PMH includes PD, CHF, Afib, NSTEMI, HTN, peripheral edema and tachycardia    PT Comments    Pt performed transfer training and gait training ( short trial ) from bed to recliner chair.  Pt is slow and guarded during session she required moderate assistance.  Based on level of function she would benefit from skilled rehab in a post acute setting ( SNF ).  Plan next session for continued progression of transfer training and LE strength to improve function.     Follow Up Recommendations  Supervision/Assistance - 24 hour;SNF     Equipment Recommendations  None recommended by PT    Recommendations for Other Services       Precautions / Restrictions Precautions Precautions: Fall Restrictions Weight Bearing Restrictions: No    Mobility  Bed Mobility Overal bed mobility: Needs Assistance Bed Mobility: Rolling;Sidelying to Sit;Sit to Sidelying Rolling: Min assist Sidelying to sit: Mod assist       General bed mobility comments: Pt performed advancement to edge of bed and into sitting with moderate assistance provided for trunk elevation.  Once in sitting she required moderate-max assistance to scoot forward to edge of bed to prepare for transfer.    Transfers Overall transfer level: Needs assistance Equipment used: Rolling walker (2 wheeled) Transfers: Sit to/from Stand Sit to Stand: Mod assist         General transfer comment: Cues for hand placement to push from seated surface.  pt required increased time to come to standing and presents with urinary incontinence.  Pt performed short shuffling steps from bed to recliner chair.    Ambulation/Gait Ambulation/Gait assistance: Mod assist Gait Distance (Feet): (steps from bed to recliner ( around 4 ft)) Assistive device: Rolling  walker (2 wheeled) Gait Pattern/deviations: Trunk flexed;Festinating;Decreased stride length;Shuffle     General Gait Details: Pt performed steps from bed to recliner chair with assistance to turn and back to seated surface.  Before sitting performed pericare to patient after urinary incontinence in standing.     Stairs             Wheelchair Mobility    Modified Rankin (Stroke Patients Only)       Balance Overall balance assessment: Needs assistance   Sitting balance-Leahy Scale: Fair       Standing balance-Leahy Scale: Poor                              Cognition Arousal/Alertness: Awake/alert Behavior During Therapy: WFL for tasks assessed/performed Overall Cognitive Status: History of cognitive impairments - at baseline                                        Exercises      General Comments        Pertinent Vitals/Pain Pain Assessment: Faces Faces Pain Scale: Hurts a little bit Pain Location: L great toe Pain Descriptors / Indicators: Discomfort Pain Intervention(s): Monitored during session;Repositioned    Home Living                      Prior Function            PT Goals (current  goals can now be found in the care plan section) Acute Rehab PT Goals Patient Stated Goal: to walk Potential to Achieve Goals: Fair Progress towards PT goals: Progressing toward goals    Frequency    Min 4X/week      PT Plan Current plan remains appropriate    Co-evaluation              AM-PAC PT "6 Clicks" Mobility   Outcome Measure  Help needed turning from your back to your side while in a flat bed without using bedrails?: A Lot Help needed moving from lying on your back to sitting on the side of a flat bed without using bedrails?: A Lot Help needed moving to and from a bed to a chair (including a wheelchair)?: A Lot Help needed standing up from a chair using your arms (e.g., wheelchair or bedside chair)?: A  Lot Help needed to walk in hospital room?: A Lot Help needed climbing 3-5 steps with a railing? : A Lot 6 Click Score: 12    End of Session Equipment Utilized During Treatment: Gait belt Activity Tolerance: Patient tolerated treatment well Patient left: in chair;with call bell/phone within reach;with chair alarm set Nurse Communication: Mobility status PT Visit Diagnosis: Other symptoms and signs involving the nervous system (R29.898);Muscle weakness (generalized) (M62.81);Unsteadiness on feet (R26.81)     Time: 1701-1716 PT Time Calculation (min) (ACUTE ONLY): 15 min  Charges:  $Therapeutic Activity: 8-22 mins                     Governor Rooks, PTA Acute Rehabilitation Services Pager [phone] Office [phone]     Davona Kinoshita Eli Hose 10/23/2018, 5:37 PM

## 2018-10-23 NOTE — NC FL2 (Addendum)
[place] LEVEL OF CARE SCREENING TOOL     IDENTIFICATION  Patient Name: Ana Salazar Birthdate: November 01, 1945 Sex: female Admission Date (Current Location): 10/18/2018  [facility] and [place] Number:  [employer] and Address:  [facility], [place] [address] [place], [place] [address]      Provider Number: [id number]  Attending Physician Name and Address:  Garvin Fila, MD  Relative Name and Phone Number:       Current Level of Care: Hospital Recommended Level of Care: [place] Prior Approval Number:    Date Approved/Denied:   PASRR Number: Manual review  Discharge Plan: SNF    Current Diagnoses: Patient Active Problem List   Diagnosis Date Noted  . Ischemic stroke ([place]) 10/18/2018  . Paroxysmal SVT (supraventricular tachycardia) ([place]) 04/19/2017  . CAD (coronary artery disease), native coronary artery 04/18/2017  . Hypertensive heart disease without CHF 04/18/2017  . Obesity (BMI 30-39.9) 04/18/2017  . Generalized weakness 12/02/2016  . 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 Parkinson's disease ([place]) 09/28/2015    Orientation RESPIRATION BLADDER Height & Weight     Self, Time, Situation, Place  Normal Incontinent Weight: 162 lb 0.6 oz (73.5 kg) Height:  5\' 3"  (160 cm)  BEHAVIORAL SYMPTOMS/MOOD NEUROLOGICAL BOWEL NUTRITION STATUS      Continent Diet(see DC summary)  AMBULATORY STATUS COMMUNICATION OF NEEDS Skin   Extensive Assist Verbally Normal                       Personal Care Assistance Level of Assistance  Bathing, Feeding, Dressing Bathing Assistance: Maximum assistance Feeding assistance: Limited assistance Dressing Assistance: Maximum assistance     Functional Limitations Info  Sight, Hearing, Speech Sight Info: Adequate Hearing Info: Adequate Speech Info: Adequate    SPECIAL CARE FACTORS FREQUENCY  PT (By licensed PT), OT (By licensed OT), Speech therapy     PT Frequency:  5x/wk OT Frequency: 5x/wk     Speech Therapy Frequency: 5x/wk      Contractures Contractures Info: Not present    Additional Factors Info  Code Status, Allergies Code Status Info: Full Allergies Info: Amoxicillin, Prednisone, Amlodipine, Levothyroxine, Meloxicam, Nebivolol, Nsaids, Valsartan           Current Medications (10/23/2018):  This is the current hospital active medication list Current Facility-Administered Medications  Medication Dose Route Frequency Provider Last Rate Last Dose  .  stroke: mapping our early stages of recovery book   Does not apply Once Burnetta Sabin L, NP      . 0.9 %  sodium chloride infusion   Intravenous Continuous Donzetta Starch, NP 50 mL/hr at 10/22/18 0000    . acetaminophen (TYLENOL) tablet 650 mg  650 mg Oral Q4H PRN Donzetta Starch, NP       Or  . acetaminophen (TYLENOL) solution 650 mg  650 mg Per Tube Q4H PRN Donzetta Starch, NP       Or  . acetaminophen (TYLENOL) suppository 650 mg  650 mg Rectal Q4H PRN Donzetta Starch, NP      . apixaban (ELIQUIS) tablet 5 mg  5 mg Oral BID Donzetta Starch, NP   5 mg at 10/22/18 2240  . atorvastatin (LIPITOR) tablet 40 mg  40 mg Oral q1800 Burnetta Sabin L, NP   40 mg at 10/22/18 1826  . carbidopa-levodopa (SINEMET IR) 25-100 MG per tablet immediate release 0.5 tablet  0.5 tablet Oral Q24H Leonie Man,  Lucy Antigua, MD   0.5 tablet at 10/22/18 1826  . carbidopa-levodopa (SINEMET IR) 25-100 MG per tablet immediate release 1 tablet  1 tablet Oral 3 times per day Donzetta Starch, NP   1 tablet at 10/23/18 0710  . hydrALAZINE (APRESOLINE) tablet 50 mg  50 mg Oral BID Rinehuls, David L, PA-C   50 mg at 10/22/18 2240  . labetalol (NORMODYNE,TRANDATE) injection 10 mg  10 mg Intravenous Q2H PRN Donzetta Starch, NP   10 mg at 10/22/18 0434  . lisinopril (PRINIVIL,ZESTRIL) tablet 40 mg  40 mg Oral Daily Burnetta Sabin L, NP   40 mg at 10/22/18 7322  . pantoprazole (PROTONIX) EC tablet 40 mg  40 mg Oral Daily Garvin Fila, MD   40 mg  at 10/22/18 0254  . senna-docusate (Senokot-S) tablet 1 tablet  1 tablet Oral QHS PRN Donzetta Starch, NP         Discharge Medications: Please see discharge summary for a list of discharge medications.  Relevant Imaging Results:  Relevant Lab Results:   Additional Information SS#: [id number]  Geralynn Ochs, LCSW  I have personally obtained history,examined this patient, reviewed notes, independently viewed imaging studies, participated in medical decision making and plan of care.ROS completed by me personally and pertinent positives fully documented  I have made any additions or clarifications directly to the above note. Agree with note above.    Antony Contras, MD Medical Director [facility] Pager: [phone] 10/23/2018 1:11 PM

## 2018-10-23 NOTE — Progress Notes (Addendum)
STROKE TEAM PROGRESS NOTE   INTERVAL HISTORY Her daughter is at the bedside. No new complaints.  Awaiting PASSR approval prior to being able to discharge to SNF.  Medically ready to go.  Vitals:   10/22/18 2349 10/23/18 0317 10/23/18 0758 10/23/18 1127  BP: (!) 149/71 (!) 143/79 (!) 166/70 (!) 143/85  Pulse:    75  Resp:   20 20  Temp: 97.8 F (36.6 C) 98 F (36.7 C) 98.2 F (36.8 C) 98.4 F (36.9 C)  TempSrc: Oral Oral Oral Oral  SpO2:  98% 94%   Weight:      Height:       IMAGING No results found.  PHYSICAL EXAM per Dr. Leonie Man [place] elderly Caucasian lady not in distress. . Afebrile. Head is nontraumatic. Neck is supple without bruit.    Cardiac exam no murmur or gallop. Lungs are clear to auscultation. Distal pulses are well felt. Neurological Exam :  Awake alert appears oriented to time place and person. Speech appears slightly dysarthric but can be understood clearly. No aphasia. Follows commands well. Extraocular moments are full range without nystagmus. Blinks to threat bilaterally. Mild right lower facial asymmetry when she smiles. Tongue midline. Motor system exam shows symmetric upper and lower extremity strength. No focal weakness. Mild resting tremor left upper extremity greater than right. Tremor improves with action. Sensation appears intact. Coordination appears intact. Gait not tested.   ASSESSMENT/PLAN Ana Salazar is a 73 y.o. female with history of PD w/ deep brain stimulator and AF presenting to [facility] with aphasia and weakness. Treated with IV tPA 10/18/2018 at 1158.  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 No source of embolus   LDL 47  HgbA1c 5.2  SCDs for VTE prophylaxis  clopidogrel 75 mg daily prior to admission, now on Eliquis (apixaban) daily    Therapy recommendations:  SNF  Disposition:  pending (lives w/ sister and goes to adult day care) - await PASSR  Atrial Fibrillation  Home anticoagulation:  none  . started eliquis  . Continue Eliquis (apixaban) daily at discharge   Hypertension  Stable 140-160s . On lisinopril 40, hydralazine 50 bid . Resume Hydrochlorothiazide today . Follow-up blood pressure   Hyperlipidemia  Home meds:  lipitor 40, resumed in hospital  LDL 47, goal < 70  Continue statin at discharge  Other Stroke Risk Factors  Advanced age  Former Cigarette smoker, quit 6 yrs ago  ETOH use, advised to drink no more than 1 drink(s) a day  Hx stroke  03/2018 -possible TIA  Coronary artery disease hx NSTEMI  Obstructive sleep apnea, on CPAP at home  Congestive heart failure  Other Active Problems  Parkinson's Disease w/ deep brain stimulator placed March and April 2018 followed by Dr. Carles Collet  Hypokalemia 3.3 up to 3.9 after supplement, resolved  Hospital day # [place], MSN, APRN, ANVP-BC, AGPCNP-BC Advanced Practice Stroke Nurse [place] for Schedule & Pager information 10/23/2018 3:05 PM  I have personally obtained history,examined this patient, reviewed notes, independently viewed imaging studies, participated in medical decision making and plan of care.ROS completed by me personally and pertinent positives fully documented  I have made any additions or clarifications directly to the above  note. Agree with note above.   Antony Contras, MD Medical Director [place] Pager: [phone] 10/23/2018 4:57 PM  To contact Stroke Continuity provider, please refer to [email or web address] After hours, contact General Neurology

## 2018-10-24 IMAGING — MR MR HEAD WO/W CM
9 of 12 series · 25 of 48 positions shown · IV contrast (multihance)
Comparison: [REDACTED] brain MRI without contrast
06/18/2013.

CLINICAL DATA: 70-year-old female with Parkinson's disease. Deep
brain stimulator preoperative evaluation. Initial encounter.

EXAM:
MRI HEAD WITHOUT AND WITH CONTRAST
TECHNIQUE: Multiplanar, multiecho pulse sequences of the brain and surrounding
structures were obtained without and with intravenous contrast.
CONTRAST:  17mL MULTIHANCE GADOBENATE DIMEGLUMINE 529 MG/ML IV SOLN

[Series 2: T2 · sagittal · 3.0mm · 0.47mm/px · 2 of 33 slices shown (1 of 3)]
[im 1/33]
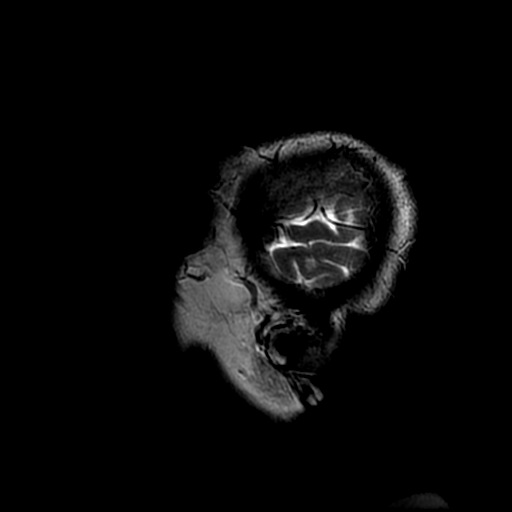
[im 33/33]
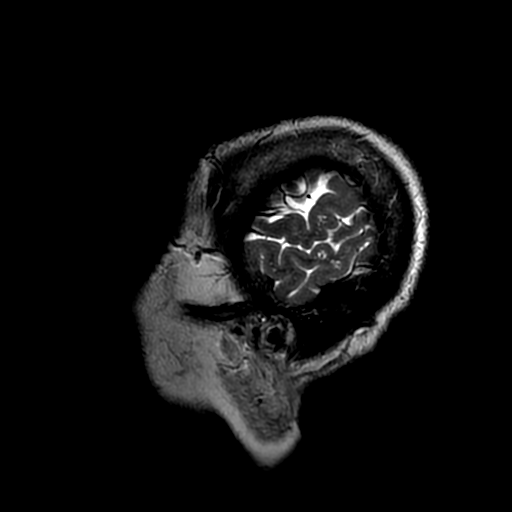

[Series 7: PD · axial · 2.0mm · 0.98mm/px · z∈[-62,+79]mm · 3 of 72 slices shown]
[im 1/72]
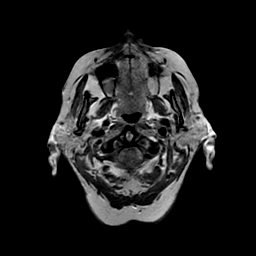
[im 36/72]
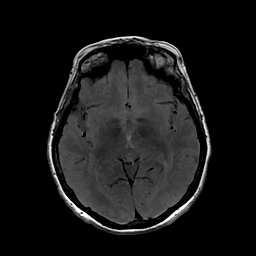
[im 72/72]
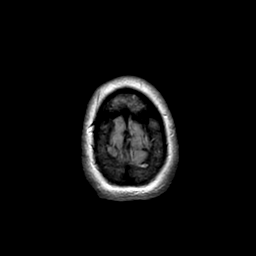

[Series 8: FLAIR · axial · 2.0mm · 0.49mm/px · z∈[-62,+79]mm · 3 of 72 slices shown]
[im 1/72]
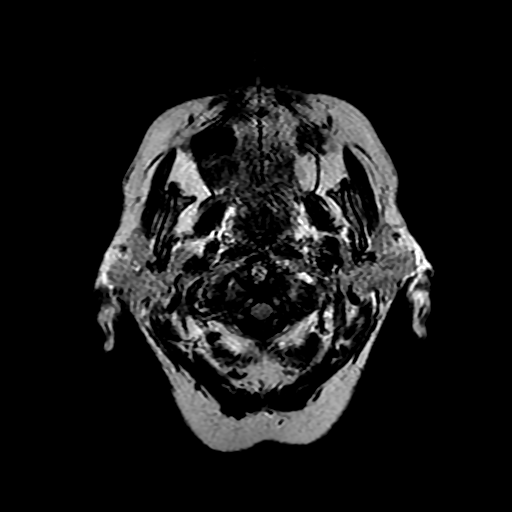
[im 36/72]
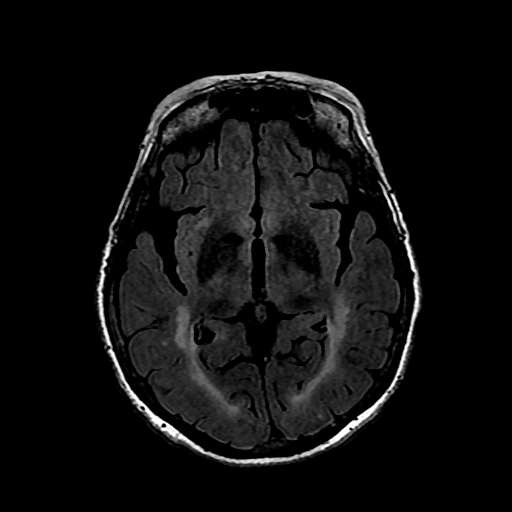
[im 72/72]
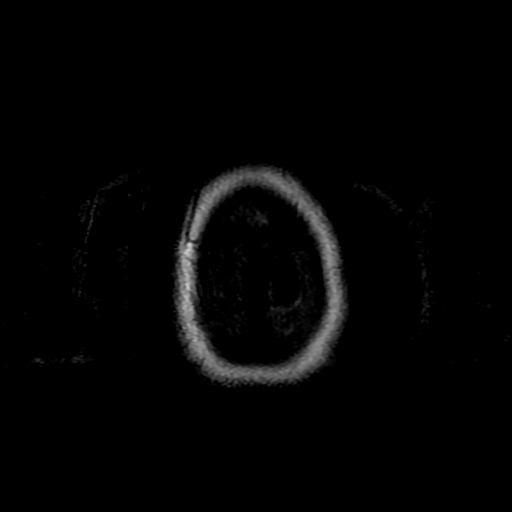

[Series 9: T2 · axial · 2.0mm · 0.49mm/px · z∈[-62,+79]mm · 3 of 72 slices shown (2 of 3)]
[im 1/72]
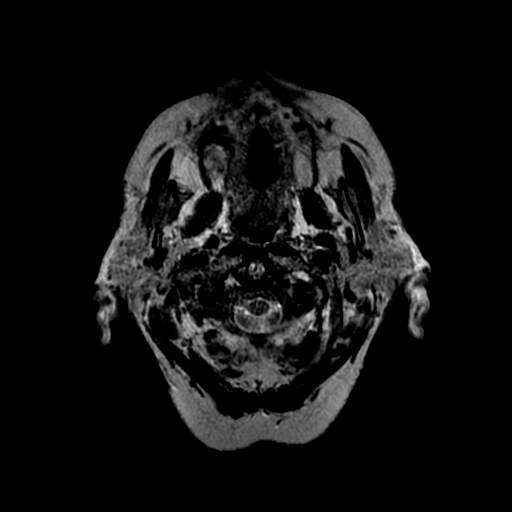
[im 36/72]
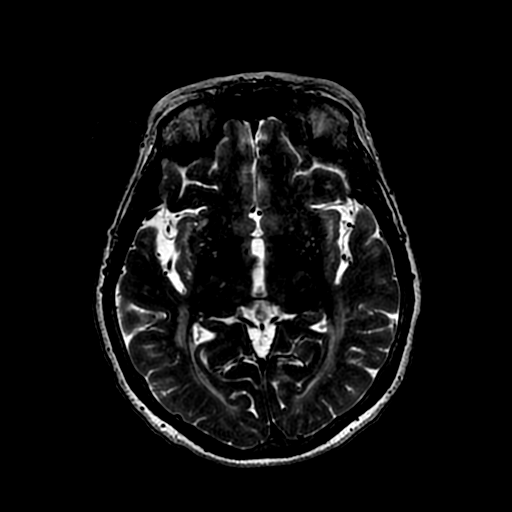
[im 72/72]
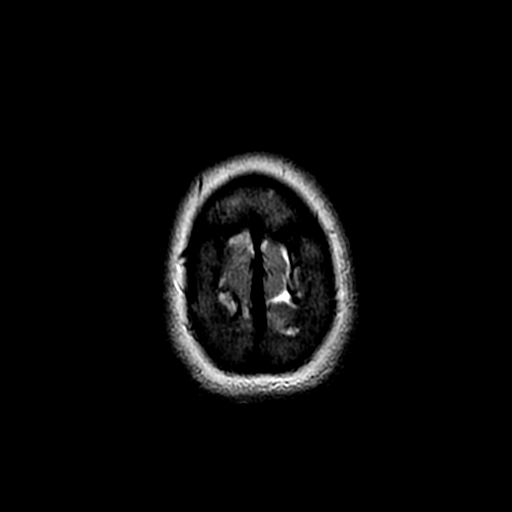

[Series 13: (person_name) · axial · 3.0mm · 0.47mm/px · z∈[-61,+8]mm · 3 of 96 slices shown]
[im 1/96]
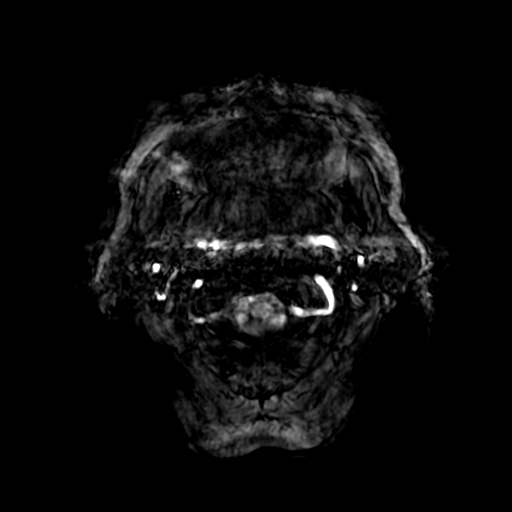
[im 24/96]
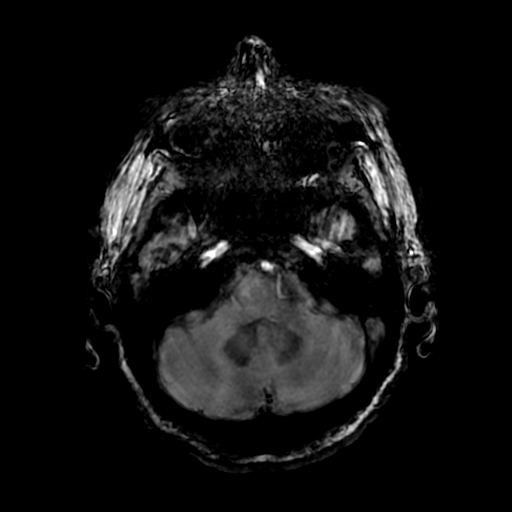
[im 48/96]
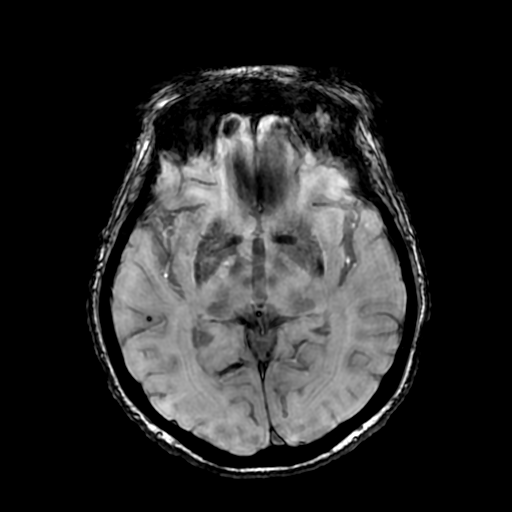

[Series 14: DWI · axial · 3.0mm · 0.94mm/px · z∈[-61,+78]mm · 5 of 96 slices shown]
[im 1/96]
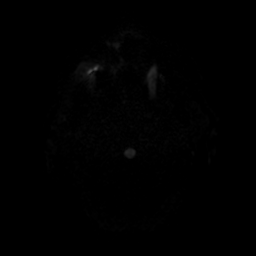
[im 24/96]
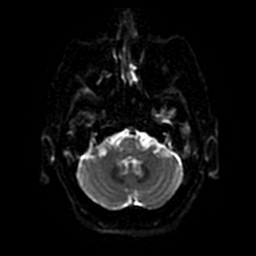
[im 48/96]
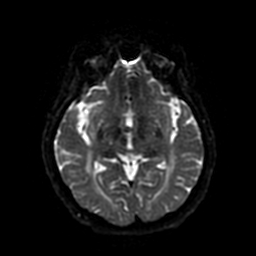
[im 72/96]
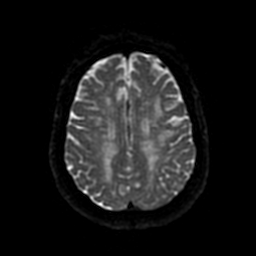
[im 96/96]
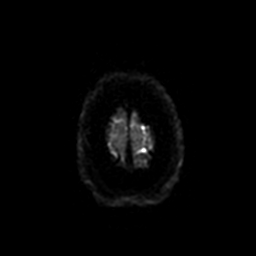

[Series 16: T2 · coronal · 3.0mm · 0.39mm/px · 2 of 40 slices shown (3 of 3)]
[im 1/40]
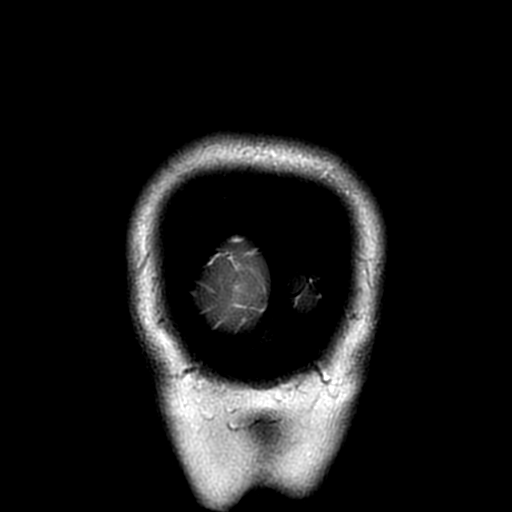
[im 40/40]
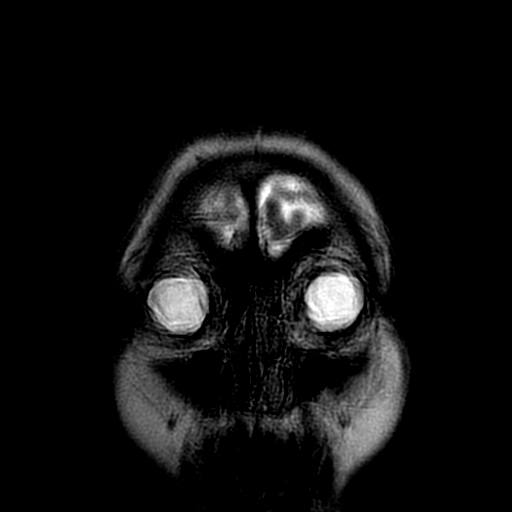

[Series 18: T1 · coronal · 3.0mm · 0.39mm/px · 2 of 40 slices shown]
[im 1/40]
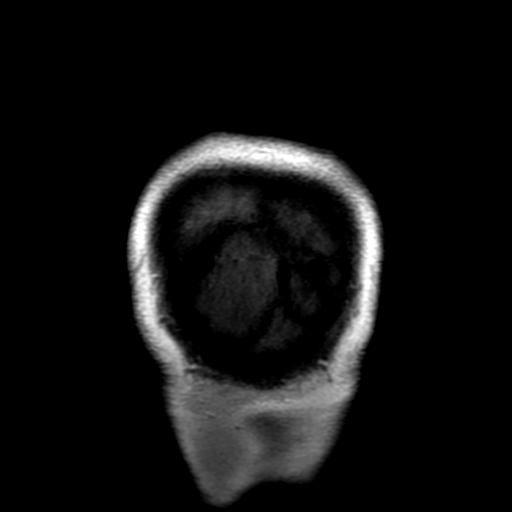
[im 40/40]
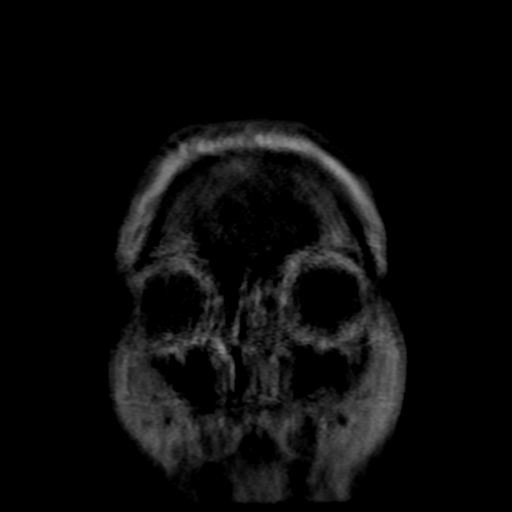

[Series 1450: ADC · axial · 3.0mm · 0.94mm/px · z∈[-61,+78]mm · 2 of 48 slices shown]
[im 1/48]
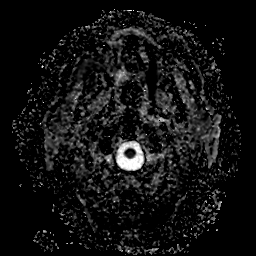
[im 48/48]
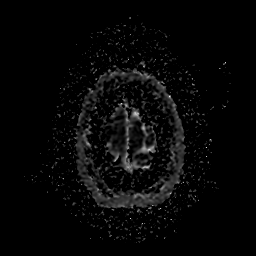

[25 of 48 positions shown; findings below may reference images not displayed]

FINDINGS: Brain: Study is mildly degraded by motion artifact despite repeated
imaging attempts.

No restricted diffusion to suggest acute infarction. No midline
shift, mass effect, evidence of mass lesion, ventriculomegaly,
extra-axial collection or acute intracranial hemorrhage.
Cervicomedullary junction and pituitary are within normal limits.

Significantly progressed since 5428, and now confluent bilateral
cerebral white matter T2 and FLAIR hyperintensity. Chronic T2
heterogeneity in the pons which in part is due to a right
paracentral chronic lacunar infarct with hemosiderin is mildly
progressed. Relative sparing of the deep gray matter nuclei,
although there is mild chronic T2 heterogeneity in the thalami I.
chronic micro hemorrhage in the posterior right temporal lobe
(series 13, image 48). No cortical encephalomalacia. Negative
cerebellum.

No abnormal enhancement identified.

Vascular: Major intracranial vascular flow voids are stable and
within normal limits.

Skull and upper cervical spine: Negative. Heterogeneous bone marrow
signal but no suspicious osseous lesion.

Sinuses/Orbits: Orbits soft tissues are partially obscured by motion
but appear stable an negative. Chronic right maxillary sinus mucous
retention cyst. Chronic bilateral mastoid effusions. Negative
nasopharynx.

Other: Visible internal auditory structures appear normal. Negative
scalp soft tissues.
IMPRESSION: 1. Intermittently motion degraded despite repeated imaging attempts.
2. Chronic signal abnormality in the pons, and progressed bilateral
cerebral white matter signal abnormality since 5428, favored due to
chronic small vessel disease.
3. Chronic mastoid effusions, most often postinflammatory and
significance doubtful.

## 2018-10-24 MED ORDER — COLCHICINE 0.6 MG PO TABS: 0.6000 mg | ORAL_TABLET | Freq: Once | ORAL | Status: DC

## 2018-10-24 MED ORDER — APIXABAN 5 MG PO TABS: 5.0000 mg | ORAL_TABLET | Freq: Two times a day (BID) | ORAL | Status: AC

## 2018-10-24 MED ORDER — LISINOPRIL 40 MG PO TABS: 40.0000 mg | ORAL_TABLET | Freq: Every day | ORAL | Status: AC

## 2018-10-24 MED ORDER — COLCHICINE 0.6 MG PO TABS: 0.6000 mg | ORAL_TABLET | Freq: Every day | ORAL | Status: DC

## 2018-10-24 MED ORDER — COLCHICINE 0.6 MG PO TABS: 0.3000 mg | ORAL_TABLET | Freq: Every day | ORAL | Status: DC

## 2018-10-24 MED ORDER — COLCHICINE 0.6 MG PO TABS: 1.2000 mg | ORAL_TABLET | Freq: Once | ORAL | Status: DC

## 2018-10-24 MED ORDER — COLCHICINE 0.6 MG PO TABS: 0.3000 mg | ORAL_TABLET | Freq: Every day | ORAL | Status: AC

## 2018-10-24 NOTE — Progress Notes (Signed)
Pasrr number still pending. CSW will continue to monitor.   Percell Locus Dellamae Rosamilia LCSW [phone]

## 2018-10-24 NOTE — Progress Notes (Signed)
Patient will DC to: [place] date: 10/24/18 Family notified: Rod Holler and Interior and spatial designer by: Ernst Spell   Per MD patient ready for DC to [facility]. RN, patient, patient's family, and facility notified of DC. Discharge Summary and FL2 sent to facility. RN to call report prior to discharge [phone]).   CSW will sign off for now as social work intervention is no longer needed. Please consult [place] again if new needs arise.  Cedric Fishman, LCSW Clinical Social Worker [phone]

## 2018-10-24 NOTE — Progress Notes (Signed)
Pasrr received: [id number]  Cedric Fishman LCSW [phone]

## 2018-10-24 NOTE — Plan of Care (Signed)
Adequate for discharge.

## 2018-10-24 NOTE — Clinical Social Work Placement (Signed)
   CLINICAL SOCIAL WORK PLACEMENT  NOTE  Date:  10/24/2018  Patient Details  Name: Ana Salazar MRN: [id number] Date of Birth: 1945-11-29  Clinical Social Work is seeking post-discharge placement for this patient at the [place] level of care (*CSW will initial, date and re-position this form in  chart as items are completed):  Yes   Patient/family provided with [place] Work Department's list of facilities offering this level of care within the geographic area requested by the patient (or if unable, by the patient's family).  Yes   Patient/family informed of their freedom to choose among providers that offer the needed level of care, that participate in Medicare, Medicaid or managed care program needed by the patient, have an available bed and are willing to accept the patient.  Yes   Patient/family informed of [facility]'s ownership interest in [facility] and [facility], as well as of the fact that they are under no obligation to receive care at these facilities.  PASRR submitted to EDS on 10/23/18     PASRR number received on 10/24/18     Existing PASRR number confirmed on       FL2 transmitted to all facilities in geographic area requested by pt/family on 10/23/18     FL2 transmitted to all facilities within larger geographic area on       Patient informed that his/her managed care company has contracts with or will negotiate with certain facilities, including the following:        Yes   Patient/family informed of bed offers received.  Patient chooses bed at [facility] and [place] recommends and patient chooses bed at      Patient to be transferred to [facility] on 10/24/18.  Patient to be transferred to facility by [facility]     Patient family notified on 10/24/18 of transfer.  Name of family member notified:  Christy     PHYSICIAN       Additional Comment:     _______________________________________________ Benard Halsted, LCSW 10/24/2018, 3:24 PM

## 2018-10-24 NOTE — Progress Notes (Signed)
Physical Therapy Treatment Patient Details Name: Ana Salazar MRN: [id number] DOB: 1946/01/13 Today's Date: 10/24/2018    History of Present Illness Pt is a 73 yo female admitted for ischemic stroke, PMH includes PD, CHF, Afib, NSTEMI, HTN, peripheral edema and tachycardia    PT Comments    Pt presented in chair with visitor present. Pt stated her pain was a 5-6/10 in her R great toe as wall as both feet in general. Nursing was informed and marked inflammation over medial 1st MTP was noted as well. Discussed gout history with pt and visitor and she stated that she takes a medication for when it flares up. Doctor notified. Pt was able to tolerate supine/seated therex in chair. Upon standing pt stated that her pain in R great toe had increased to 10/10 pain and she returned to seated after ~1 minute. Pt left in chair with all needs, phone, and call bell within reach and visitor still present.    Follow Up Recommendations  Supervision/Assistance - 24 hour;SNF     Equipment Recommendations  None recommended by PT    Recommendations for Other Services       Precautions / Restrictions Precautions Precautions: Fall Restrictions Weight Bearing Restrictions: No    Mobility   Transfers Overall transfer level: Needs assistance Equipment used: Rolling walker (2 wheeled) Transfers: Sit to/from Stand Sit to Stand: Mod assist;+2 physical assistance       General transfer comment: Pt required modA +2 to come fully erect from seated position with mod vc/tc for hand placement and posture to correct posterior lean.   Ambulation/Gait             General Gait Details: Pt unable to complete gait d/t R great toe pain.    Stairs             Wheelchair Mobility    Modified Rankin (Stroke Patients Only)       Balance Overall balance assessment: Needs assistance Sitting-balance support: Bilateral upper extremity supported Sitting balance-Leahy Scale: Fair     Standing  balance support: Bilateral upper extremity supported Standing balance-Leahy Scale: Poor Standing balance comment: Pt able to stand at [facility] with modA +2 for 1 minute with no LOB and VC/TC for posture and weight distribution.                             Cognition Arousal/Alertness: Awake/alert Behavior During Therapy: WFL for tasks assessed/performed Overall Cognitive Status: History of cognitive impairments - at baseline                                        Exercises General Exercises - Lower Extremity Ankle Circles/Pumps: AROM;10 reps;Both;Supine Quad Sets: 10 reps;Supine;AROM;Both Heel Slides: AAROM;Both;10 reps;Supine Hip ABduction/ADduction: AAROM;Both;10 reps;Supine Straight Leg Raises: AAROM;Both;10 reps;Supine B heelcord stretch 2x15 sec ea.    General Comments        Pertinent Vitals/Pain Pain Score: 6  Faces Pain Scale: Hurts a little bit Pain Location: R great toe Pain Descriptors / Indicators: Discomfort Pain Intervention(s): Limited activity within patient's tolerance;Monitored during session;Repositioned    Home Living                      Prior Function            PT Goals (current goals can now be found in the  care plan section) Acute Rehab PT Goals Patient Stated Goal: to get better    Frequency    Min 4X/week      PT Plan Current plan remains appropriate    Co-evaluation              AM-PAC PT "6 Clicks" Mobility   Outcome Measure  Help needed turning from your back to your side while in a flat bed without using bedrails?: A Lot Help needed moving from lying on your back to sitting on the side of a flat bed without using bedrails?: A Lot Help needed moving to and from a bed to a chair (including a wheelchair)?: A Lot Help needed standing up from a chair using your arms (e.g., wheelchair or bedside chair)?: A Lot Help needed to walk in hospital room?: A Lot Help needed climbing 3-5 steps with a  railing? : A Lot 6 Click Score: 12    End of Session Equipment Utilized During Treatment: Gait belt Activity Tolerance: Patient tolerated treatment well Patient left: in chair;with call bell/phone within reach;with chair alarm set;with family/visitor present Nurse Communication: Mobility status PT Visit Diagnosis: Other symptoms and signs involving the nervous system (R29.898);Muscle weakness (generalized) (M62.81);Unsteadiness on feet (R26.81)     Time: 1211-1229 PT Time Calculation (min) (ACUTE ONLY): 18 min  Charges:  $Therapeutic Exercise: 8-22 mins                    Maryelizabeth Kaufmann, SPTA   Maryelizabeth Kaufmann 10/24/2018, 12:55 PM

## 2018-10-24 NOTE — Progress Notes (Signed)
Occupational Therapy Treatment Patient Details Name: Marketta Valadez MRN: [id number] DOB: 06/29/1946 Today's Date: 10/24/2018    History of present illness Pt is a 73 yo female admitted for ischemic stroke, PMH includes PD, CHF, Afib, NSTEMI, HTN, peripheral edema and tachycardia   OT comments  Pt making progress towards goals and motivated for OT intervention this session. Pt standing but unable to have full upright posture without max manual facilitation and posterior lean noted as well. Squat pivot transfer utilized for safety with mod A into recliner chair. Pt initiating scoots back into chair with increased time and min cuing. Forced use of R UE for grooming task while seated in chair. Decreased coordination noted but pt able to complete task with increased time.   Follow Up Recommendations  SNF          Precautions / Restrictions Precautions Precautions: Fall       Mobility Bed Mobility Overal bed mobility: Needs Assistance Bed Mobility: Rolling;Sidelying to Sit;Sit to Sidelying Rolling: Min assist   Supine to sit: Min assist   Sit to sidelying: Min assist General bed mobility comments: min A for trunk elevation from bed with mod cuing for hand placement and proper technique  Transfers Overall transfer level: Needs assistance Equipment used: Rolling walker (2 wheeled) Transfers: Sit to/from [employer] Sit to Stand: Max assist   Squat pivot transfers: Mod assist     General transfer comment: pt unable to come into upright position with standing and with severe posterior lean in standing.     Balance Overall balance assessment: Needs assistance Sitting-balance support: Bilateral upper extremity supported Sitting balance-Leahy Scale: Fair     Standing balance support: Bilateral upper extremity supported Standing balance-Leahy Scale: Poor         ADL either performed or assessed with clinical judgement   ADL       Grooming: Brushing hair;Set  up;Sitting Grooming Details (indicate cue type and reason): with forced use of R UE           Vision Patient Visual Report: No change from baseline            Cognition Arousal/Alertness: Awake/alert Behavior During Therapy: WFL for tasks assessed/performed Overall Cognitive Status: History of cognitive impairments - at baseline                      Pertinent Vitals/ Pain       Faces Pain Scale: Hurts a little bit Pain Location: L great toe Pain Descriptors / Indicators: Discomfort Pain Intervention(s): Limited activity within patient's tolerance;Monitored during session;Repositioned      Frequency  Min 2X/week        Progress Toward Goals  OT Goals(current goals can now be found in the care plan section)  Progress towards OT goals: Progressing toward goals  Acute Rehab OT Goals Patient Stated Goal: to get better OT Goal Formulation: With patient Time For Goal Achievement: 11/07/18 Potential to Achieve Goals: Good  Plan Discharge plan remains appropriate       AM-PAC OT "6 Clicks" Daily Activity     Outcome Measure   Help from another person eating meals?: None Help from another person taking care of personal grooming?: A Little Help from another person toileting, which includes using toliet, bedpan, or urinal?: A Lot Help from another person bathing (including washing, rinsing, drying)?: A Little Help from another person to put on and taking off regular upper body clothing?: A Little Help from another person to  put on and taking off regular lower body clothing?: A Lot 6 Click Score: 17    End of Session Equipment Utilized During Treatment: Rolling walker  OT Visit Diagnosis: Unsteadiness on feet (R26.81);Other abnormalities of gait and mobility (R26.89)   Activity Tolerance Patient tolerated treatment well   Patient Left with call bell/phone within reach;in chair;with chair alarm set   Nurse Communication Mobility status        Time:  0940-1000 OT Time Calculation (min): 20 min  Charges: OT General Charges $OT Visit: 1 Visit OT Treatments $Self Care/Home Management : 8-22 mins   Lealand Elting P , MS, OTR/L 10/24/2018, 10:25 AM

## 2018-10-24 NOTE — Progress Notes (Signed)
Patient discharged with sister to facility

## 2018-11-12 ENCOUNTER — Telehealth: Payer: Self-pay | Admitting: Neurology

## 2018-11-12 NOTE — Telephone Encounter (Signed)
Find out if she can come in on 3/25.  Tell her that I won't be able to address her stimulator that day though and it will just be a hospital f/u.

## 2018-11-12 NOTE — Telephone Encounter (Signed)
Patient had a stroke about 4 weeks ago. She was seen in the hospital and was told to follow up with another Neurologist? Her daughter called in wanting to make a follow up with Dr. Carles Collet. The next available is in July. Should she be seen sooner? Please Call Daughter. Thanks

## 2018-11-13 NOTE — Telephone Encounter (Signed)
Contacted patient's daughter and gave her the appointment Date and Time. Daughter said the Hospital set her up with another Neurologist. She will call and let them know that she is already established with Dr. Carles Collet. Her daughter also wanted to make sure the Stroke did not effect her DBS? Thanks

## 2018-11-13 NOTE — Telephone Encounter (Signed)
I made an appt on 11/28/2018 at 10:45 am for hospital follow up (won't look at [facility]). Ana Salazar - can you let them know appt date/time please?

## 2018-11-16 ENCOUNTER — Telehealth: Payer: Self-pay | Admitting: Radiation Oncology

## 2018-11-16 NOTE — Telephone Encounter (Signed)
Opened in error

## 2018-11-28 ENCOUNTER — Ambulatory Visit: Payer: Medicare (Managed Care) | Admitting: Neurology

## 2018-12-04 ENCOUNTER — Inpatient Hospital Stay: Payer: Self-pay | Admitting: Adult Health

## 2018-12-12 ENCOUNTER — Telehealth: Payer: Self-pay

## 2018-12-12 NOTE — Telephone Encounter (Signed)
Pt daughter has called RN Katrina back, she is asking for a call

## 2018-12-12 NOTE — Telephone Encounter (Signed)
LEft vm with Christy that appt with Janett Billow NP will be cancel on 12/20/2018. I also stated pt can follow up with DR.Tat at [facility] neurology for her neuro issues. If she had questions to call back. There is a note with Dr. Carles Collet office that daughter was going to cancel at our office.

## 2018-12-13 NOTE — Telephone Encounter (Signed)
Left vm for patients daughter Alyse Low to call back. I explained appt with Janett Billow NP was cancel on 12/20/2018. Pt is a establish pt at :[facility] neurology with Dr. Carles Collet. I stated she call back if she has any questions about the cancel appt.

## 2018-12-13 NOTE — Telephone Encounter (Signed)
I talk with Alyse Low daughter about her moms appt being cancel with Janett Billow NP. Alyse Low stated her mom is in a adult day care during the day and they handle all of her medical appts. She stated the adult center recommend she see Janett Billow NP for the hospital stroke follow up. I stated that a video visit can be done at pts house. Alyse Low stated her mom lives in another household, and no one has a Youth worker. I tried to r/s with Alyse Low but she stated it has to be approve by her moms adult center. The adult center is closed now. I recommend to [facility] when the center open up their medical staff can call to r/s. Christy verbalized understanding.

## 2018-12-19 ENCOUNTER — Inpatient Hospital Stay: Payer: Self-pay | Admitting: Adult Health

## 2018-12-20 ENCOUNTER — Inpatient Hospital Stay: Payer: Self-pay | Admitting: Adult Health

## 2019-01-18 ENCOUNTER — Other Ambulatory Visit: Payer: Self-pay

## 2019-01-18 NOTE — Patient Outreach (Signed)
Telephone outreach to patient to obtain mRs was successfully completed. mRs= 4. 

## 2019-03-07 NOTE — Progress Notes (Signed)
u   Ana Salazar was seen today in the movement disorders clinic for neurologic consultation at the request of [name], MD.   The patient presents today for a second opinion regarding Parkinsons disease. She is accompanied by her daughter and sister who supplement the history.   I have reviewed an extensive number of her records from her prior neurologist, [name], and I appreciate those records.  The patient presented to [name] first in November, 2014, but reported that she had had symptoms for about a year and a half prior to that.  Her first symptoms consisted of difficulty moving her feet/shuffling the feet; trouble getting out of the bed; tremor (she thinks that it started in both but does state that the right is worse than the left and always has been).  The patient was started on a trial of levodopa at her first visit with [name] and followed up with him the next month and noted great improvement with the medication.  She was started on pramipexole the following visit but noted that it made her dizzy and it was discontinued over the telephone.  The following visit, she was started on entacapone 200 mg 3 times a day.  In April, 2015 her carbidopa/levodopa 25/100 was increased to 2 tablets 3 times per day.  In July, 2016 she was changed to carbidopa/levodopa 50/200, 3 times per day.  In October, 2016 she was placed on a combination of carbidopa/levodopa 50/200, 3 times a day (4-5am, noon, 8pm) in addition to carbidopa/levodopa 25/100, 2 tablets 3 times per day (1200 mg of levodopa per day).  She remains on the entacapone, 200 mg 3 times per day as well as primidone, 50 mg - 1.5 tablets at night, which she is using for tremor control.  She reports that her biggest frustration is that she is always moving and is more dizzy and is starting to have a few falls.  When she is nearing end of dose (1-2 hours prior) she will have tremor in her stomach, followed by her legs and arms.    10/13/15  update:  The patient is following care today, primarily for levodopa challenge.  She is currently off of medication.  She is accompanied by her daughter (different daughter than last visit) who supplements the history.  She last took her medication at 8pm.  Her medication generally consists of carbidopa/levodopa 50/200 3 times per day in addition to carbidopa/levodopa 25/100, 1 tablet 6 times per day.  Splitting the medication did help somewhat, but she thinks that it is not helping as much as it does initially when she first started doing it that way.  She is also on entacapone 200 mg 3 times per day and primidone 75 mg at night.  She has not had any falls since our last visit.  01/12/16 update:  The patient is following up today, as she called me to ask me about potentially decreasing her levodopa. She is accompanied by her sister who supplements the history.   She is on a very large dosage and somewhat of a strange combination, but came to me on this combination.  She is currently taking carbidopa/levodopa 50/200 3 times a day (5am/noon/8pm) and is now spreading out her carbidopa/levodopa 25/100 to one tablet 6 times per day (5am is the start and she takes that q 3 hrs until 8 pm).  She remains on entacapone 200 mg 3 times a day and primidone 75 mg daily.  She goes to  bed at about 8pm.  She has found that she is having more dyskinesia.  She has been working with rehabilitation therapist and I have gotten a couple of correspondence is from them that they have been concerned about labored breathing.   I advised follow-up with her primary care physician if this was true shortness of breath.  She did say that she wore a holter monitor for a month and that eval was negative.  Was told if continues needs pulm.  Only SOB when she is hot or exerting herself.  He also mentioned that they were concerned about inability to track inferiorly.  She did see [name] for her neuropsych testing, done on 12/22/2015.  She got  feedback from them on 01/05/2016.  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  I specifically spoke with [name], and she did not feel there was evidence of an atypical state from her testing.  She felt that she could potentially be a good DBS candidate.  She did recommend a vision evaluation, medication to help with anxiety/sleep/irritability, and a follow-up regarding a history of sleep apnea.  Pt states that she was already to the eye doctor in December at [facility].    02/02/16 update:  The patient is following up today, earlier than expected.  She is accompanied by her sister who supplements the history.  Last visit I changed her daytime extended release levodopa all over 2 immediate release.  Therefore, she was taking carbidopa/levodopa 25/100 as follows: 2 at 5am (she is waking up to take that pill and goes to bed until 7am/1 at 8am/1 at 11 am//2 at 2 pm/1 at 5pm and then would take an additional levodopa/levodopa 50/200 at 8pm.  This dropped her overall load of levodopa from 1200 mg to 900 mg, primarily because she was complaining about dyskinesia.  She called me on May 22 and stated that she was doing well initially, but now feels more weak and stiff.  When I asked her specifically, she admitted that she was being treated for urinary tract infection.  I told her to give that some time to resolve, because that can worsen the symptoms of Parkinsons.  She called the following day and requested a follow-up appointment here, which is the reason for follow-up.  She states that she is weaker, which means that the legs are "tired."  She denies freezing.  She states that overall she is more tired.  No falls.  She states that the sx's do not wax and wane throughout the day.  Her sister thinks that she is doing better but sister does state that she is "starting to get that shuffle."  Last PT was in mid April.  C/o nose dripping for years.    05/04/16 update:  The patient follows up today,  accompanied by her sister who supplements the history.  Last visit, the patient wanted to decrease her levodopa load, primarily because of dyskinesia.  We ended up reworking it significantly.  I changed her from the CR formulation to the immediate release formulation and did drop the overall load somewhat.  She is currently taking carbidopa/levodopa 25/100, 2 tablets at 7 AM/2 tablet at 10 AM/2 tablets at 1 PM/1 tablet at 4 PM/carbidopa/levodopa 50/200 at bedtime (8PM).  She cut out the 7pm dosage.    She takes entacapone 3 times per day, which turned out to be every other dose of levodopa.  She is still on primidone, but only on 50 mg  daily, which she was on prior to coming to see me.  She initally states that she isn't better but then states that dyskinesia is "a lot better than it was" which was the primary issue for changing.   She has had several falls that she attributes to looking down when she walks or getting dizzy when she first gets up.  Was able to get off of clonidine and losartan per PCP because of dizziness and that helped dizziness.  She hasn't gotten hurt with falls.   The big issue now is trouble sleeping.  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  She states that she was only using one time per day.  She also told me last visit that she was getting ready to have a sleep study for objective sleep apnea syndrome and a pulmonary consult for shortness of breath.  I do not have that information but she tells me that she does have osas and needs cpap titration and is awaiting that.  Denies depression but daughter thinks that she has it.  Pt doesn't have much to do during the day.  06/06/16 update: The patient follows up today, accompanied by her sister and daughter who supplement the history.  She is currently taking carbidopa/levodopa 25/100, 2 tablets at 7 AM/2 tablet at 10 AM/2 tablets at 1 PM/1 tablet at 4  PM/carbidopa/levodopa 50/200 at bedtime (8PM).  She is on entacapone 200 mg 3 times per day.  States that "my legs are really tired." Thinks that this is mostly in middle of the day/early AM and then "I spend the entire day trying to catch up."   Finds that if she takes an extra carbidopa/levodopa 25/100 in the middle of the night she does better.  Last visit, I discontinued her primidone, 50 mg daily.  She was complaining about vasomotor rhinorrhea last visit, and I told her to increase her atropine drops to 3 times a day as needed.  She states that she hasn't do that.  I started remeron last visit for depression and sleep.  Is still on celexa.  Remeron definitely helped sleep but got CPAP since our last visit as well.  Sister states that she is using that and is now screaming and hollaring and hitting the walls at night, but seems this was going on before CPAP.    07/21/16 update:  Pt returns again for levodopa challenge test.  2 of her daughters accompany her and supplement the history.  She is on carbidopa/levodopa 25/100, 2 tablets at 7 AM/2 tablet at 10 AM//2 tablets at 1 PM/1 tablet at 4 PM and then she will decide if she needs her last one tablet at 7 PM.  She also takes carbidopa/levodopa  50/200 at bedtime, which is 8 PM.  She is on comtan 200 mg tid.  She has been off of all PD med for about 60 hrs for this test.  She does feel more slowly and stiff, although she does feel less dizzy getting off of the medication.  10/25/16 update:  Pt returns today for pre-op visit.  She is accompanied by her daughter who supplements the history.  She is scheduled for DBS surgery next month.  She is on carbidopa/levodopa 25/100, 2/2/2/1 and sometimes will take another in middle of the night.  She takes carbidopa/levodopa 50/200 at bedtime.  She takes entacapone with 3 of the daytime dosages of levodopa.  She has had  some falls because not using her walker, often at night per daughter.  She is loving [employer].  Patient seen today in follow-up.  01/02/17 update:  Patient seen today in follow-up.  She is accompanied by her daughters who supplement the history.  She is status post bilateral STN DBS on 11/29/2016.  She had her generator placed on 12/07/2016.  She was in the hospital between the 2 surgeries with complaints of falls.  I had talked to Dr. Vertell Limber.  The patient had refused to go to subacute nursing facility for rehabilitation.  She had apparently also been refusing to use her walker.  We did get home health involved.  She has had a few falls since home health.  She fell on her butt this weekend and "it was the dogs fault."  Few other falls related to letting go of the walker.  Patient does think some of the falls were related to dizziness.  Her primary care physician has just cut back on her verapamil.  Some of issues that were being reported were due to family stress between patient and her sister.  She is now back on carbidopa/levodopa 25/100, 2/2/2/1 and entacapone with 3 of the dosages.  She is on carbidopa/levodopa 50/200 at bedtime.  She last took her PD med at [place] has not been great.  Family having difficulty understanding her.  Hasn't been doing ST with home health.  01/17/17 update:  Patient seen today in follow-up.  I activated her device on 01/02/2017.  She is currently on carbidopa/levodopa 25/100, 2 tablets at 7, 1 tablet at 10, 1 tablet at 1pm, 1 tablet at 4pm and she has d/c the carbidopa/levodopa 50/200 at night.  I discontinued her entacapone last visit.  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  She has been having some dyskinesia mostly with the L leg.  She is not stable and has had some falls in the house.  She has never had a fall with the walker but has had a fall without the walker.  Speech therapy just started last week.    02/03/17 update:  Patient seen today in follow-up.  Last visit, I decreased her medication and told her to  take carbidopa/levodopa 25/100, 1 tablets at 7am, 0.5 tablets at 11am, 3pm, 7pm.  While dyskinesia is better, she has had multiple falls.  With all of the falls, she has let go of the walker for some reason (getting out of shower, making a sandwich).  "I'm taking the walker more than I did."  She has never fallen when actually holding on to the walker.  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  05/23/17 update: patient seen today in follow-up for her Parkinson's disease.  She is accompanied by her daughter and sister who supplements the history.  Much has happened since our last visit.  I have reviewed hospital records made available to me.  She was admitted to the hospital on 04/17/2017 and discharged on 04/19/2017 with NSTEMI.  Hospital discharge records indicate that she was supposed to follow-up with Dr. Wynonia Lawman 2 weeks after discharge, but I don't see any appointment that she has attended, nor do I see an appointment made in the future.  They tell me that they have an appointment today.  She was given information for the adult daycare program.  She states that they have trouble getting her meds  ontime but the therapy helped.  It has also produced a great schedule for her, which has helped per family but patient doesn't like it.  Pt would like to return to [facility] as she feels weak. In regards to Parkinson's disease, she is on carbidopa/levodopa 25/100, one tablet at 7 AM and half a tablet at 11 AM/ on full tablet at 3 PM/ half tablet at 7 PM.  She is noting intermittent right arm tremor.  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    09/12/17 update: Patient seen today in follow-up for Parkinson's disease.  She is accompanied by her her daughter who supplements the history.  The patient is on carbidopa/levodopa 25/100, 1 tablet at 7 AM, 1 tablet at 11 AM, 1 tablet at 3 PM, half a tablet at 7 PM.  She remains on clonazepam 0.5 mg, half  tablet at night for REM behavior disorder.  I have reviewed records since her last visit.  She saw Dr. Wynonia Lawman the same day she saw me last visit.  She has had a long history of SVT.  He wanted her to see an electrophysiologist for consideration of possible ablation versus pacemaker.  She had a cardiac ablation on June 12, 2017 with Dr. Lovena Le.  Following that, it appears that she was having systolic PVCs or PACs and Toprol was prescribed.  She was given parameters.  She was told if systolic <299  hold hydralazine but take Toprol XL and lisinopril.  She was also advised to take her blood pressure before taking her evening hydralazine and lisinopril. she has had falls since our last visit.  They are happening about 1 time per week.  She feels it is due to "lightheadedness and my knees giving way."  Not related to timing of medication.  Always has walker.  Has not had PT but then states that she is doing some therapy at [place].  Going to [facility] one day a week, but daughter states that she is giving excuses to stop going to that (such as stating that she has discharged her DBS system).  Trouble with insomnia but also just "laying around" when at home and not engaging in activities.  Complains of drooling, which has become excessive.  01/18/18 update: Patient is seen today in follow-up.  Records have been reviewed since our last visit.  She is currently on carbidopa/levodopa 25/100, 1 tablets at 7am, 1 tablets at 11am, 1 tablet at 3pm, half tablet at 7pm.  She was told to restart her clonazepam for REM behavior disorder.  She reports that she changed to melatonin.  She is using a bed rail.  Her daughter hears her yelling in sleep about 1 time per week. She was seen by cardiology in February.  She was told to wean off of the Toprol because of weakness and bradycardia.  She is feeling better in that regard.  She is having rare falls per daughter but pt estimates 1-2 times per month.  Using walker at all times.  She is  going to therapy and day program 5 days per week.  Charging device 5-6 days per week and taking 1.5 hours.   Daughter not noticing dyskinesia at all.   06/21/18 update: Patient is seen today in follow-up for Parkinson's disease, accompanied by her sister and daughter who supplements the history.  Patient attends a daycare facility during the day, and the NP there increase the patient's levodopa to 7 tablets/day from 3-1/2 tablets/day.  Obviously, this was a concern given patient's narrow window between dyskinesia and bradykinesia.  Pt reports today that she isn't taking the 7 tablets and is still taking the 3-1/2 tablets/day.  She has had several falls since last visit - "legs just give out."  Daughter states one time she bent over to pick up something and just kept going.  She reports that she generally is using a walker when this happens.  I have reviewed records from [facility] which were dated July 12 March 18, 2018.  Patient presented with difficulty with stating words and speech seemed slurred for 10 or 15 minutes.  She was evaluated by tele-neurology and was not a candidate for TPA as she was out of the therapeutic window.  In addition, her speech issues resolved very quickly.  Head CT was negative.  She could not have an MRI as the device she has was not MRI compatible at the time.  She had a CTA of the neck that demonstrated moderate 50% proximal ICA stenosis and none on the left.  Plavix was added and aspirin continued but she reports that ASA was d/c and she is just on Plavix.  Echocardiogram was complete and demonstrated left ventricular ejection fraction between 60 to 65% and mild left ventricular hypertrophy.  06/28/18 update: Patient worked in today.  Patient is accompanied by her daughter who supplements history.  Was noting more dyskinesia after adjustment last week.  Patient reports she had significant dyskinesia over the weekend, but reports that it has been much better over the last 2  days.  She had one fall on Monday.  She had just gotten up off of the toilet and had not yet grabbed the walker and fell.  10/15/18 update: Patient is seen today in follow-up for Parkinson's disease, status post deep brain stimulation implant.  She is accompanied by her daughter and her sister who supplements the history.  She had some adjustments last visit to her DBS.  We did get a call on November 7 that the patient felt like she was pulling to the right, but realized that she changed the setting on her stimulator back to the original setting and did much worse with that.  Her daughter changed it back and she was doing better.  She is currently on carbidopa/levodopa 25/100, 1 tablet / 1 tablet/1 tablet / 0.5 tablets.  Sister complains about significant fluctuations.  Some days she has a lot of trouble with speech and ambulation and other days she does much better.  Today seems to be 1 of the tough days.  She has noticed on days that she has urinary tract infections, she does much worse.  She is now on prophylaxis.  Patient does admit to hallucinations (her sister was completely unaware of this).  The patient states that she sometimes sees her mother.  She is aware that this is not real.  Daughter thinks motivation to do things is very low.  She asks me about this today.  03/11/19 update: Patient seen today in follow-up for Parkinson's disease.  She is on carbidopa/levodopa 25/100, 1 tablet at 7 AM/11 AM/3 PM and half tablet at 7 PM.  Medical records have been reviewed since last visit.  The patient was in the hospital a few days after I last saw her in February.  She was seen by neurology.  She apparently went to the hospital with weakness on the right side with worsening dysarthria.  She was given TPA.  They did not  do an MRI of the brain.  CT of the brain was done and demonstrated only severe small vessel disease.  On admission, her Plavix was changed to Eliquis post TPA given her history of atrial  fibrillation.  Pt fell not long after that per pt and broke her nose.   This was last fall (end of feb) per pt.  Family reports that walking has still been off and they wonder if she had another mini stroke at end of may/beginning of June.  This was because she was "out of it."  However, she had a UTI.  Pt reports that she is always "out of it" with a UTI and has trouble recovering from them.  Pt no longer having hallucinations.  Doing PT x 6 weeks.  Hasn't done ST for 8 months.   ALLERGIES:   Allergies  Allergen Reactions   Amoxicillin Anaphylaxis, Swelling, Rash and Other (See Comments)    Has patient had a PCN reaction causing immediate rash, facial/tongue/throat swelling, SOB or lightheadedness with hypotension: Yes PCN reaction causing SEVERE RASH INVOLVING MUCUS MEMBRANES or SKIN NECROSIS: Yes Has patient had a PCN reaction that required hospitalization: No Has patient had a PCN reaction occurring within the last 10 years: Yes    Prednisone Shortness Of Breath, Nausea Only and Other (See Comments)    Hypertension    Amlodipine Itching and Other (See Comments)    BURNING   Levothyroxine Other (See Comments)    Tired, shaky, tremors   Meloxicam Swelling and Other (See Comments)    "Burning "   Nebivolol Rash   Nsaids Nausea And Vomiting   Valsartan Itching    CURRENT MEDICATIONS:  Outpatient Encounter Medications as of 03/11/2019  Medication Sig   apixaban (ELIQUIS) 5 MG TABS tablet Take 1 tablet (5 mg total) by mouth 2 (two) times daily.   atorvastatin (LIPITOR) 40 MG tablet Take 1 tablet (40 mg total) by mouth daily at 6 PM. (Patient taking differently: Take 40 mg by mouth daily. )   atropine 1 % ophthalmic solution Place 1 drop under the tongue 2 (two) times daily as needed (drooling). Place drop between gums and cheek as needed.   carbidopa-levodopa (SINEMET IR) 25-100 MG tablet Take 0.5-1 tablets by mouth See admin instructions. Take 1 tablet by mouth at 7 am, 1  tablet at 11 am, 1 tablet at 3 pm and 1/2 tablet at 7 pm   colchicine 0.6 MG tablet Take 0.5 tablets (0.3 mg total) by mouth daily.   Fluticasone Furoate (ARNUITY ELLIPTA) 100 MCG/ACT AEPB Inhale 1 puff into the lungs daily.    hydrALAZINE (APRESOLINE) 50 MG tablet Take 50 mg by mouth 2 (two) times daily.    hydrochlorothiazide (HYDRODIURIL) 25 MG tablet Take 25 mg by mouth daily.   lisinopril (PRINIVIL,ZESTRIL) 40 MG tablet Take 1 tablet (40 mg total) by mouth daily.   Melatonin 5 MG TABS Take 5 mg by mouth at bedtime.   Polyethyl Glycol-Propyl Glycol (SYSTANE) 0.4-0.3 % SOLN Place 1 drop into both eyes 2 (two) times daily as needed (dry eyes).   No facility-administered encounter medications on file as of 03/11/2019.     PAST MEDICAL HISTORY:   Past Medical History:  Diagnosis Date   Anemia    Anxiety    takes Klonopin as bedtime   Asthma    CHF (congestive heart failure) (HCC)    Depression    takes Citalopram daily   Gout    History of blood transfusion    "  w/hysterectomy" (04/17/2017)   Hypertension    takes Verapamil daily   Hypothyroidism    NSTEMI (non-ST elevated myocardial infarction) ([place]) 04/17/2017   OSA on CPAP     "sitting on 5" (04/17/2017)   Overactive bladder    Parkinson's disease ([place])    takes Sinemet daily   Peripheral edema    take Lasix as needed   Pneumonia    "when I was a kid" (04/17/2017)   Seasonal allergies    Skin cancer    left elbow" (04/17/2017)   Stroke [facility])    "weaker on my right side since; he last one I know of was in 2014" (04/17/2017)   Tachycardia    Urinary incontinence     PAST SURGICAL HISTORY:   Past Surgical History:  Procedure Laterality Date   [place] CATH AND CORONARY ANGIOGRAPHY N/A 04/18/2017   Procedure: LEFT HEART CATH AND CORONARY ANGIOGRAPHY;  Surgeon: Jettie Booze, MD;  Location: [place] [facility];  Service: Cardiovascular;   Laterality: N/A;   MINOR PLACEMENT OF FIDUCIAL N/A 11/22/2016   Procedure: Fiducial placement;  Surgeon: Erline Levine, MD;  Location: [place];  Service: Neurosurgery;  Laterality: N/A;  Fiducial placement   PULSE GENERATOR IMPLANT Right 12/06/2016   Procedure: Unilateral PULSE GENERATOR IMPLANT;  Surgeon: Erline Levine, MD;  Location: [place];  Service: Neurosurgery;  Laterality: Right;  Unilateral PULSE GENERATOR IMPLANT   RADIOLOGY WITH ANESTHESIA N/A 09/13/2016   Procedure: MRI BRAIN WITH AND WITHOUT;  Surgeon: Medication Radiologist, MD;  Location: [place];  Service: Radiology;  Laterality: N/A;   SKIN CANCER EXCISION Left    points to elbow   SUBTHALAMIC STIMULATOR INSERTION Bilateral 11/29/2016   Procedure: Bilateral Deep brain stimulator placement;  Surgeon: Erline Levine, MD;  Location: [facility];  Service: Neurosurgery;  Laterality: Bilateral;  Bilateral Deep brain stimulator placement   SVT ABLATION N/A 06/12/2017   Procedure: SVT Ablation;  Surgeon: Evans Lance, MD;  Location: [facility];  Service: Cardiovascular;  Laterality: N/A;   THYROIDECTOMY, PARTIAL     TONSILLECTOMY     TUBAL LIGATION     UTERINE FIBROID SURGERY      SOCIAL HISTORY:   Social History   Socioeconomic History   Marital status: Widowed    Spouse name: Not on file   Number of children: Not on file   Years of education: Not on file   Highest education level: Not on file  Occupational History   Occupation: retired  Scientist, product/process development strain: Not on file   Food insecurity    Worry: Not on file    Inability: Not on file   Transportation needs    Medical: Not on file    Non-medical: Not on file  Tobacco Use   Smoking status: Former Smoker    Quit date: 09/27/2012    Years since quitting: 6.4   Smokeless tobacco: Never Used  Substance and Sexual Activity   Alcohol use: Yes    Alcohol/week: 0.0 standard drinks    Comment: 04/17/2017 "nothing since the 1980s"   Drug  use: No   Sexual activity: Never  Lifestyle   Physical activity    Days per week: Not on file    Minutes per session: Not on file   Stress: Not on file  Relationships   Social connections    Talks on phone: Not on file  Gets together: Not on file    Attends religious service: Not on file    Active member of club or organization: Not on file    Attends meetings of clubs or organizations: Not on file    Relationship status: Not on file   Intimate partner violence    Fear of current or ex partner: Not on file    Emotionally abused: Not on file    Physically abused: Not on file    Forced sexual activity: Not on file  Other Topics Concern   Not on file  Social History Narrative   Not on file    FAMILY HISTORY:   Family Status  Relation Name Status   Mother  Deceased       heart disease, DM, complications of fall   Father  Deceased       MI   Sister  Deceased       DM, CAD, renal failure   Sister  Alive       healthy   Daughter x3 Alive    ROS:  Review of Systems  Constitutional: Positive for malaise/fatigue.  HENT: Negative.   Eyes: Negative.   Respiratory: Negative.   Cardiovascular: Negative.   Gastrointestinal: Negative.   Genitourinary: Negative.   Skin: Negative.   Neurological: Positive for weakness (generalized).     PHYSICAL EXAMINATION:    VITALS:   Vitals:   03/11/19 1436  BP: 127/75  Pulse: 64  Temp: 98.2 F (36.8 C)  SpO2: 96%  Weight: 154 lb 6.4 oz (70 kg)    GEN:  The patient appears stated age and is in NAD. HEENT:  Normocephalic, atraumatic.  The mucous membranes are moist. The superficial temporal arteries are without ropiness or tenderness. CV: Regular rate rhythm Lungs: Clear to auscultation bilaterally Neck/HEME:  There are no carotid bruits bilaterally.  Neurological examination:  Orientation: Alert and oriented x3 Cranial nerves: There is good facial symmetry. The speech is fluent and significantly dysarthric  and hypophonic (same as prior). Soft palate rises symmetrically and there is no tongue deviation. Hearing is intact to conversational tone. Sensation: Sensation is intact to light touch throughout Motor: Strength is at least antigravity x4.  Movement examination: Tone: No rigidity Abnormal movements:   No dyskinesia.  There is occasional R hand and arm tremor, resolved after reprogramming today Coordination: There isdecremation, with any form of RAMS, including alternating supination and pronation of the forearm, hand opening and closing, finger taps, heel taps and toe taps on the right.   Gait and Station: she pushes off of the wheelchair and requires no assistance to get out of the wheelchair.  The legs are bent at the knees (making her somewhat unstable).  She is given a walker.  She walks well with the walker.     Labs:    Chemistry      Component Value Date/Time   NA 142 10/22/2018 1024   NA 143 05/30/2017 1311   K 3.9 10/22/2018 1024   CL 109 10/22/2018 1024   CO2 26 10/22/2018 1024   BUN 23 10/22/2018 1024   BUN 20 05/30/2017 1311   CREATININE 0.82 10/22/2018 1024      Component Value Date/Time   CALCIUM 9.4 10/22/2018 1024   ALKPHOS 71 12/03/2016 0416   AST 9 (L) 12/03/2016 0416   ALT <5 (L) 12/03/2016 0416   BILITOT 0.7 12/03/2016 0416       DBS programming was performed today.    ASSESSMENT/PLAN:  1.  idiopathic Parkinson's disease, diagnosed in 2014 with symptoms dating back to early 2013.  -The patient is status post bilateral STN DBS with the [facility] [employer] device on 11/29/2016 with IPG placed on 12/07/2016.  Her device was activated on 01/02/2017.  Spoke with the patient today about potentially changing her battery out to an MRI compatible device.  She is going to discuss this with her family.  -She will continue carbidopa/levodopa 25/100, 1 tablets at 7am, 1 tablets at 11am, 1 tablet at 3pm, half tablet at 7pm   -Discussed again with the patient that her  DBS is fairly well side and the things that DBS helps with, it is helping with, but the things that she has the most difficulty with now (balance, speech) it is not going to help with.  Gave her an order for speech therapy.  She is already doing physical therapy at home.   2.  RBD and insomnia  -she d/c klonopin when started using bed rails.    -we will continue to hold remeron since that was primarily for sleep (it did work if need to come back to that)   3.  Vasomotor rhinitis  -Has atropine ophthalmic.  She needs to try and increase to tid.    4.  OSAS and SOB  -She has CPAP.  5.  Dizziness/Orthostatic hypotension  -somewhat improved.  Cardiology has been working with her BP.  Better after d/c toprol  6.  NSTEMI in 04/2017  -she has subsequently had a cardiac ablation for SVT.  7. Possible TIA in July, 2019 and ? In February, 2020 (received TPA in February, 2020)  -We discussed the diagnosis as well as pathophysiology of the disease.  We discussed signs and sx's of stroke and importance of calling 911 immediately should she have any of these.  Patient education was provided.    -Talked about stroke risk factors which are modifiable.  -Talked about importance of blood pressure control with a goal <130/80 mm Hg.   -Talked about importance of lipid control and proper diet.  Lipids should be managed intensively, with a goal LDL < 70 mg/dL.  Last LDL was 47 in February, 2020.  She is on Lipitor and LDL at goal  -Patient is on Eliquis given her history of atrial fibrillation.  Discussed with her that she needs to make sure she is following up with cardiology.  Eliquis is not something that I manage.  8.  Hallucinations  -No longer having hallucinations.  These seem closely tied to her urinary tract infections.  9.  Depression  -Better today, likely because she is not going to the day program because of the pandemic.  10.  Follow-up in the next 6 months, sooner should new neurologic  issues arise.

## 2019-03-11 ENCOUNTER — Other Ambulatory Visit: Payer: Self-pay

## 2019-03-11 ENCOUNTER — Encounter: Payer: Self-pay | Admitting: Neurology

## 2019-03-11 ENCOUNTER — Ambulatory Visit (INDEPENDENT_AMBULATORY_CARE_PROVIDER_SITE_OTHER): Payer: Medicare (Managed Care) | Admitting: Neurology

## 2019-03-11 VITALS — BP 127/75 | HR 64 | Temp 98.2°F | Wt 154.4 lb

## 2019-03-11 DIAGNOSIS — R471 Dysarthria and anarthria: Secondary | ICD-10-CM

## 2019-03-11 DIAGNOSIS — G20 Parkinson's disease: Secondary | ICD-10-CM

## 2019-03-11 DIAGNOSIS — G459 Transient cerebral ischemic attack, unspecified: Secondary | ICD-10-CM

## 2019-03-11 NOTE — Patient Instructions (Signed)
Good to see you today.  As a reminder we discussed;  1.  Changing out your battery to an MRI compatible battery, because there are several times you have needed an MRI and haven't been able to have one.  [facility] would pay for that battery but your insurance would have to pay for that surgery  2.  Speech therapy  3.  Using walker at all times

## 2019-03-11 NOTE — Procedures (Signed)
DBS Programming was performed on [employer] Device  Total time spent programming was 28 minutes.  Device was found to be on and adequately charged.   Impedences were checked and were within normal limits).  Battery was checked and was determined to be functioning normally and not near the end of life.    Final settings were as follows:   Left brain electrode:     2-(60%) 3-(40%) C+    ; Amplitude  2.7 ; Pulse width 50 microseconds;   Frequency   130   Hz.   Right brain electrode:     11-C+          ; Amplitude   1.8  mAmps ;  Pulse width 60  microseconds;  Frequency   130    Hz.

## 2019-03-12 ENCOUNTER — Telehealth: Payer: Self-pay | Admitting: Neurology

## 2019-03-12 NOTE — Telephone Encounter (Signed)
Printed fax

## 2019-03-12 NOTE — Telephone Encounter (Signed)
Lori from [facility] is needing notes from Feb 10/15/18 appt and then yesterdays 03/11/19 appt: Fax to [phone] for authorization approvals; ATTN: Cecille Rubin. Thanks!

## 2019-03-22 ENCOUNTER — Encounter: Payer: PRIVATE HEALTH INSURANCE | Admitting: Neurology

## 2019-07-30 NOTE — Progress Notes (Signed)
Ana Salazar was seen today in follow up for Parkinsons disease.   Pt denies falls.  Pt denies lightheadedness, near syncope.  No hallucinations.  Mood has been fair.  Last visit, I talked to the patient about making sure that she got back with cardiology to make sure that she was following for her orthostasis, but also for her atrial fibrillation and Eliquis.  I do not see that she has seen them.  She states that [facility] is [facility] the Eliquis.  Her sister states that she is living with her and she cannot take care of her much longer.  [facility].  She is doing PT there but not speech therapy.  Rare dyskinesia per sister.    Current prescribed movement disorder medications: Carbidopa/levodopa 25/100, 1 tablet at 7 AM, 1 tablet at 11 AM, 1 tablet at 3 PM, half tablet at 7 PM (this is bedtime per patient)    ALLERGIES:   Allergies  Allergen Reactions  . Amoxicillin Anaphylaxis, Swelling, Rash and Other (See Comments)    Has patient had a PCN reaction causing immediate rash, facial/tongue/throat swelling, SOB or lightheadedness with hypotension: Yes PCN reaction causing SEVERE RASH INVOLVING MUCUS MEMBRANES or SKIN NECROSIS: Yes Has patient had a PCN reaction that required hospitalization: No Has patient had a PCN reaction occurring within the last 10 years: Yes   . Prednisone Shortness Of Breath, Nausea Only and Other (See Comments)    Hypertension   . Amlodipine Itching and Other (See Comments)    BURNING  . Levothyroxine Other (See Comments)    Tired, shaky, tremors  . Meloxicam Swelling and Other (See Comments)    "Burning "  . Nebivolol Rash  . Nsaids Nausea And Vomiting  . Valsartan Itching    CURRENT MEDICATIONS:  Outpatient Encounter Medications as of 08/05/2019  Medication Sig  . apixaban (ELIQUIS) 5 MG TABS tablet Take 1 tablet (5 mg total) by mouth 2 (two) times daily.  Marland Kitchen atorvastatin (LIPITOR) 40 MG tablet Take 1 tablet (40 mg total) by  mouth daily at 6 PM. (Patient taking differently: Take 40 mg by mouth daily. )  . atropine 1 % ophthalmic solution Place 1 drop under the tongue 2 (two) times daily as needed (drooling). Place drop between gums and cheek as needed.  . carbidopa-levodopa (SINEMET IR) 25-100 MG tablet Take 0.5-1 tablets by mouth See admin instructions. Take 1 tablet by mouth at 7 am, 1 tablet at 11 am, 1 tablet at 3 pm and 1/2 tablet at 7 pm  . colchicine 0.6 MG tablet Take 0.5 tablets (0.3 mg total) by mouth daily.  . Fluticasone Furoate (ARNUITY ELLIPTA) 100 MCG/ACT AEPB Inhale 1 puff into the lungs daily.   . hydrALAZINE (APRESOLINE) 50 MG tablet Take 50 mg by mouth 2 (two) times daily.   . hydrochlorothiazide (HYDRODIURIL) 25 MG tablet Take 25 mg by mouth daily.  Marland Kitchen lisinopril (PRINIVIL,ZESTRIL) 40 MG tablet Take 1 tablet (40 mg total) by mouth daily.  . Melatonin 5 MG TABS Take 5 mg by mouth at bedtime.  Vladimir Faster Glycol-Propyl Glycol (SYSTANE) 0.4-0.3 % SOLN Place 1 drop into both eyes 2 (two) times daily as needed (dry eyes).   No facility-administered encounter medications on file as of 08/05/2019.     PAST MEDICAL HISTORY:   Past Medical History:  Diagnosis Date  . Anemia   . Anxiety    takes Klonopin as bedtime  . Asthma   .  CHF (congestive heart failure) ([place])   . Depression    takes Citalopram daily  . Gout   . History of blood transfusion    "w/hysterectomy" (04/17/2017)  . Hypertension    takes Verapamil daily  . Hypothyroidism   . NSTEMI (non-ST elevated myocardial infarction) ([place]) 04/17/2017  . OSA on CPAP     "sitting on 5" (04/17/2017)  . Overactive bladder   . Parkinson's disease ([place])    takes Sinemet daily  . Peripheral edema    take Lasix as needed  . Pneumonia    "when I was a kid" (04/17/2017)  . Seasonal allergies   . Skin cancer    left elbow" (04/17/2017)  . Stroke [facility])    "weaker on my right side since; he last one I know of was in 2014" (04/17/2017)  . Tachycardia    . Urinary incontinence     PAST SURGICAL HISTORY:   Past Surgical History:  Procedure Laterality Date  . ABDOMINAL HYSTERECTOMY    . CESAREAN SECTION  1976  . LEFT HEART CATH AND CORONARY ANGIOGRAPHY N/A 04/18/2017   Procedure: LEFT HEART CATH AND CORONARY ANGIOGRAPHY;  Surgeon: Jettie Booze, MD;  Location: [facility];  Service: Cardiovascular;  Laterality: N/A;  . MINOR PLACEMENT OF FIDUCIAL N/A 11/22/2016   Procedure: Fiducial placement;  Surgeon: Erline Levine, MD;  Location: [place];  Service: Neurosurgery;  Laterality: N/A;  Fiducial placement  . PULSE GENERATOR IMPLANT Right 12/06/2016   Procedure: Unilateral PULSE GENERATOR IMPLANT;  Surgeon: Erline Levine, MD;  Location: [facility];  Service: Neurosurgery;  Laterality: Right;  Unilateral PULSE GENERATOR IMPLANT  . RADIOLOGY WITH ANESTHESIA N/A 09/13/2016   Procedure: MRI BRAIN WITH AND WITHOUT;  Surgeon: Medication Radiologist, MD;  Location: [place];  Service: Radiology;  Laterality: N/A;  . SKIN CANCER EXCISION Left    points to elbow  . SUBTHALAMIC STIMULATOR INSERTION Bilateral 11/29/2016   Procedure: Bilateral Deep brain stimulator placement;  Surgeon: Erline Levine, MD;  Location: [place];  Service: Neurosurgery;  Laterality: Bilateral;  Bilateral Deep brain stimulator placement  . SVT ABLATION N/A 06/12/2017   Procedure: SVT Ablation;  Surgeon: Evans Lance, MD;  Location: [facility];  Service: Cardiovascular;  Laterality: N/A;  . THYROIDECTOMY, PARTIAL    . TONSILLECTOMY    . TUBAL LIGATION    . UTERINE FIBROID SURGERY      SOCIAL HISTORY:   Social History   Socioeconomic History  . Marital status: Widowed    Spouse name: Not on file  . Number of children: Not on file  . Years of education: Not on file  . Highest education level: Not on file  Occupational History  . Occupation: retired  Scientific laboratory technician  . Financial resource strain: Not on file  . Food insecurity    Worry: Not on file    Inability: Not  on file  . Transportation needs    Medical: Not on file    Non-medical: Not on file  Tobacco Use  . Smoking status: Former Smoker    Quit date: 09/27/2012    Years since quitting: 6.8  . Smokeless tobacco: Never Used  Substance and Sexual Activity  . Alcohol use: Yes    Alcohol/week: 0.0 standard drinks    Comment: 04/17/2017 "nothing since the 1980s"  . Drug use: No  . Sexual activity: Never  Lifestyle  . Physical activity    Days per week: Not on file    Minutes  per session: Not on file  . Stress: Not on file  Relationships  . Social Herbalist on phone: Not on file    Gets together: Not on file    Attends religious service: Not on file    Active member of club or organization: Not on file    Attends meetings of clubs or organizations: Not on file    Relationship status: Not on file  . Intimate partner violence    Fear of current or ex partner: Not on file    Emotionally abused: Not on file    Physically abused: Not on file    Forced sexual activity: Not on file  Other Topics Concern  . Not on file  Social History Narrative  . Not on file    FAMILY HISTORY:   Family Status  Relation Name Status  . Mother  Deceased       heart disease, DM, complications of fall  . Father  Deceased       MI  . Sister  Deceased       DM, CAD, renal failure  . Sister  Alive       healthy  . Daughter x3 Alive    ROS:  Review of Systems  Constitutional: Positive for malaise/fatigue.  HENT: Negative.   Eyes: Negative.   Respiratory: Negative.   Cardiovascular: Negative.   Skin: Negative.   Neurological: Positive for weakness.    PHYSICAL EXAMINATION:    VITALS:   Vitals:   08/05/19 1429  BP: 132/82  Pulse: 61  Resp: 18  SpO2: 94%    GEN:  The patient appears stated age and is in NAD. HEENT:  Normocephalic, atraumatic.  The mucous membranes are moist. The superficial temporal arteries are without ropiness or tenderness. CV:  RRR Lungs:  CTAB Neck/HEME:   There are no carotid bruits bilaterally.  Neurological examination:  Orientation: The patient is alert and oriented x3. Cranial nerves: There is good facial symmetry with facial hypomimia. The speech is fluent and dysarthric and hypophonic. Soft palate rises symmetrically and there is no tongue deviation. Hearing is intact to conversational tone. Sensation: Sensation is intact to light touch throughout Motor: Strength is at least antigravity x4.  Movement examination: Tone: There is normal tone in the upper and lower extremities. Abnormal movements: There is left lower extremity dyskinesia and mild left shoulder dyskinesia prior to reprogramming today Coordination:  There is  decremation with RAM's, especially with finger and toe taps on the right, prior to reprogramming Gait and Station: The patient pushed herself out of the wheelchair.  She is given a walker.  She flexes at the knee, but walks fairly well with the walker in the hallway.   ASSESSMENT/PLAN:  1.  idiopathic Parkinson's disease, diagnosed in 2014 with symptoms dating back to early 2013.             -The patient is status post bilateral STN DBS with the [facility] Scientific device on 11/29/2016 with IPG placed on 12/07/2016.  Her device was activated on 01/02/2017.  Spoke with the patient today about potentially changing her battery out to an MRI compatible device.  She is going to discuss this with her family.             -She will continue carbidopa/levodopa 25/100, but I changed the timing of the medication to 1 tablet at 7 AM/10 a.m./1 PM and half a tablet to 1 tablet at 4 PM.  This moves the  dosages closer together.  -I reprogrammed her DBS today.  She had no dyskinesia in the left side when she left.             -Discussed again with the patient that her DBS is fairly well side and the things that DBS helps with, it is helping with, but the things that she has the most difficulty with now (balance, speech) it is not going to  help with.    -Her sister asked me if she would ever walk again.  She actually walked quite well in my hallway.  While I am well aware that Parkinson symptoms fluctuate, they should not fluctuate so much that she is not able to walk at all at home and requires wheelchair at all times.  When she was in her last visit, she was not getting sent to [facility] and depression was much better as she does not like it there.  Today, she is [facility] with [facility] and she is not doing as well, and I suspect that this is much more mental/depression that it is physical.   2.  RBD and insomnia             -she d/c klonopin when started using bed rails.               -we will continue to hold remeron since that was primarily for sleep (it did work if need to come back to that)   3.  OSAS and SOB             -on cpap  4.  Dizziness/Orthostatic hypotension             -Doing okay with this.  Has followed with cardiology in the past.  5.  NSTEMI in 04/2017             -she has subsequently had a cardiac ablation for SVT.  This is another reason she needs to f/u with cardiology and encouraged them to call for f/u appt  6. Possible TIA in July, 2019 and ? In February, 2020 (received TPA in February, 2020)             -We discussed the diagnosis as well as pathophysiology of the disease.  We discussed signs and sx's of stroke and importance of calling 911 immediately should she have any of these.  Patient education was provided.               -Talked about stroke risk factors which are modifiable.             -Talked about importance of blood pressure control with a goal <130/80 mm Hg.              -Talked about importance of lipid control and proper diet.  Lipids should be managed intensively, with a goal LDL < 70 mg/dL.  Last LDL was 47 in February, 2020.  She is on Lipitor and LDL at goal             -Patient is on Eliquis given her history of atrial fibrillation.  Discussed with her that she needs  to make sure she is following up with cardiology.  Eliquis is not something that I manage.  Her sister states that [facility] is managing this.  I discussed with them that she still needs to see a cardiologist.  Her sister states that they cannot do anything without [facility] approval.  I do not quite  understand that, but I encouraged them to follow back up with cardiology.  7.  Hallucinations             -No longer having hallucinations.  These seem closely tied to her urinary tract infections.  8.  Follow-up in the next 6 months, sooner should new neurologic issues arise.  Much greater than 50% of this visit was spent in counseling and coordinating care.    Cc:  [name], MD

## 2019-08-05 ENCOUNTER — Other Ambulatory Visit: Payer: Self-pay

## 2019-08-05 ENCOUNTER — Encounter: Payer: Self-pay | Admitting: Neurology

## 2019-08-05 ENCOUNTER — Ambulatory Visit (INDEPENDENT_AMBULATORY_CARE_PROVIDER_SITE_OTHER): Payer: No Typology Code available for payment source | Admitting: Neurology

## 2019-08-05 VITALS — BP 132/82 | HR 61 | Resp 18

## 2019-08-05 DIAGNOSIS — G20 Parkinson's disease: Secondary | ICD-10-CM | POA: Diagnosis not present

## 2019-08-05 DIAGNOSIS — F330 Major depressive disorder, recurrent, mild: Secondary | ICD-10-CM

## 2019-08-05 NOTE — Procedures (Signed)
DBS Programming was performed on [employer] Device  Total time spent programming was 23 minutes.  Device was found to be on and adequately charged.   Impedences were checked and were within normal limits.  Ramp on at 10 sec.  Battery was checked and was determined to be functioning normally and not near the end of life.    Final settings were as follows:   Left brain electrode:     2-(60%) 3-(40%) C+    ; Amplitude  3.36mA ; Pulse width 50 microseconds;   Frequency   130   Hz.   Right brain electrode:     11-(25%)12-(75%)C+          ; Amplitude   1.8  mAmps ;  Pulse width 60  microseconds;  Frequency   130    Hz.

## 2019-08-05 NOTE — Patient Instructions (Signed)
1.  Change carbidopa/levodopa 25/100 to 1 tablet at 7am/10 am/1pm and 1/2 tablet to 1 tablet at 4pm 2.  You need to make a follow up with cardiology.  The physicians and staff at [facility] are committed to providing excellent care. You may receive a survey requesting feedback about your experience at our office. We strive to receive "very good" responses to the survey questions. If you feel that your experience would prevent you from giving the office a "very good " response, please contact our office to try to remedy the situation. We may be reached at [phone]. Thank you for taking the time out of your busy day to complete the survey.

## 2019-08-27 ENCOUNTER — Other Ambulatory Visit: Payer: Self-pay

## 2019-08-27 ENCOUNTER — Telehealth: Payer: Self-pay | Admitting: Neurology

## 2019-08-27 DIAGNOSIS — R531 Weakness: Secondary | ICD-10-CM

## 2019-08-27 DIAGNOSIS — G20 Parkinson's disease: Secondary | ICD-10-CM

## 2019-08-27 DIAGNOSIS — R2681 Unsteadiness on feet: Secondary | ICD-10-CM

## 2019-08-27 NOTE — Telephone Encounter (Signed)
Yes, her sister told me in the office that she "shows off" here and never does that for her at home.  Suspect that some of this is motivational from depression.  Unfortunately, changing her DBS is not going to help that.  PT is really the only thing that helps getting moving.

## 2019-08-27 NOTE — Telephone Encounter (Signed)
I just adjusted her DBS recently (or reviewed its settings).  What is not moving well?  Discussed with patients sister last visit that she is probably as good as she is going to get in terms of DBS settings.  Make sure with pts sister that pt has no focal or lateralizing symptoms (weakness/numbness on one side of body, etc).  Pts sister is caregiver and she lives with patient.

## 2019-08-27 NOTE — Telephone Encounter (Signed)
She would like to do PT but the sister is mentioning a [facility]? I am unsure of how to proceed with a referral.

## 2019-08-27 NOTE — Telephone Encounter (Signed)
Patient's daughter called and left a message stating the patient is not moving her lower body much and that her DBS may need adjusted.

## 2019-08-27 NOTE — Telephone Encounter (Signed)
Okay.  Happy to sign that.

## 2019-08-27 NOTE — Telephone Encounter (Signed)
She said its just hard for her to move to the point she can not move without someone moving her. This is all weakness on the lower half of the body. Reviewed the message with the sister. She stated that she is not the same as when in office, and that when she was able to use the walker at the visit she can not do that at all at home.

## 2019-08-27 NOTE — Telephone Encounter (Signed)
No active DPR. Please advise

## 2019-08-27 NOTE — Telephone Encounter (Signed)
I have the order ready just pending Dr signature before I fax to the [facility] facility

## 2019-08-28 ENCOUNTER — Telehealth: Payer: Self-pay | Admitting: Neurology

## 2019-08-28 NOTE — Telephone Encounter (Signed)
Lori from [facility] states we sent a referral to their office for PT and patient is already doing PT just wanted [place] to know

## 2019-09-13 ENCOUNTER — Telehealth: Payer: Self-pay | Admitting: Neurology

## 2019-09-13 DIAGNOSIS — R1310 Dysphagia, unspecified: Secondary | ICD-10-CM

## 2019-09-13 NOTE — Telephone Encounter (Signed)
Ana Salazar is aware and I spoke with the department and they are calling to schedule now

## 2019-09-13 NOTE — Telephone Encounter (Signed)
Patient daughter has some questions about patient swallowing

## 2019-09-13 NOTE — Telephone Encounter (Signed)
Patient has gotten to where she will hardly eat. When she tries to take her pills she leaves them in her mouth and they "drool" out. [facility] stated that her mouth muscles are not working correctly for her to swallow medications. So she is not taking her medications. Low BP. She has a catheter placed and she refused a feeding tube. Alyse Low said that she is going down hill fast and now they are unsure as a family how to help her. [facility] stated that she needed a second opinion from Neurology because they feel she has more wrong then what has been discussed by Dr Tat. Please advise if anything that can be done.

## 2019-09-13 NOTE — Telephone Encounter (Signed)
Left message to call office back

## 2019-09-13 NOTE — Telephone Encounter (Signed)
You can refer for MBE dx: dysphagia [facility] treats her (they are PA's/NP's I believe).  They can certainly refer her for a second opinion.  I have no objection at all to that.

## 2019-09-17 ENCOUNTER — Telehealth: Payer: Self-pay | Admitting: Neurology

## 2019-09-17 NOTE — Telephone Encounter (Signed)
I would be happy to provide a second opinion for parkinsonism.

## 2019-09-17 NOTE — Telephone Encounter (Signed)
Pt is being referred to our office for a second opinion for Parkinsons she has seen Dr. Carles Collet at [facility] for this diagnosis. Please advise if she can be scheduled with our office.   Thank you

## 2019-09-27 ENCOUNTER — Ambulatory Visit: Payer: Medicaid Other | Admitting: Neurology

## 2019-09-30 ENCOUNTER — Telehealth: Payer: Self-pay | Admitting: Neurology

## 2019-09-30 NOTE — Telephone Encounter (Signed)
Please advise 

## 2019-09-30 NOTE — Telephone Encounter (Signed)
Family is aware

## 2019-09-30 NOTE — Telephone Encounter (Signed)
Patient's sister called to report the patient passed away yesterday. She'd like to know what to do the the patient's DBS charger and remote?

## 2019-09-30 NOTE — Telephone Encounter (Signed)
They can just throw it away.  I'm sorry to hear of her passing.

## 2019-10-07 DEATH — deceased

## 2020-02-04 ENCOUNTER — Encounter: Payer: No Typology Code available for payment source | Admitting: Neurology
